# Patient Record
Sex: Female | Born: 1950 | Race: White | Hispanic: No | Marital: Married | Smoking: Former smoker
Health system: Southern US, Community
[De-identification: ages and names within clinical notes are randomized; demographics above are authoritative.]

## PROBLEM LIST (undated history)

## (undated) DIAGNOSIS — F329 Major depressive disorder, single episode, unspecified: Secondary | ICD-10-CM

## (undated) DIAGNOSIS — G2581 Restless legs syndrome: Secondary | ICD-10-CM

## (undated) DIAGNOSIS — G894 Chronic pain syndrome: Secondary | ICD-10-CM

## (undated) DIAGNOSIS — I671 Cerebral aneurysm, nonruptured: Secondary | ICD-10-CM

## (undated) DIAGNOSIS — M1990 Unspecified osteoarthritis, unspecified site: Secondary | ICD-10-CM

## (undated) DIAGNOSIS — N189 Chronic kidney disease, unspecified: Secondary | ICD-10-CM

## (undated) DIAGNOSIS — E785 Hyperlipidemia, unspecified: Secondary | ICD-10-CM

## (undated) DIAGNOSIS — M51369 Other intervertebral disc degeneration, lumbar region without mention of lumbar back pain or lower extremity pain: Secondary | ICD-10-CM

## (undated) DIAGNOSIS — Z87442 Personal history of urinary calculi: Secondary | ICD-10-CM

## (undated) DIAGNOSIS — I10 Essential (primary) hypertension: Secondary | ICD-10-CM

## (undated) DIAGNOSIS — F32A Depression, unspecified: Secondary | ICD-10-CM

## (undated) DIAGNOSIS — G4733 Obstructive sleep apnea (adult) (pediatric): Secondary | ICD-10-CM

## (undated) DIAGNOSIS — E669 Obesity, unspecified: Secondary | ICD-10-CM

## (undated) DIAGNOSIS — R32 Unspecified urinary incontinence: Secondary | ICD-10-CM

## (undated) DIAGNOSIS — H269 Unspecified cataract: Secondary | ICD-10-CM

## (undated) DIAGNOSIS — R7303 Prediabetes: Secondary | ICD-10-CM

## (undated) DIAGNOSIS — G4730 Sleep apnea, unspecified: Secondary | ICD-10-CM

## (undated) DIAGNOSIS — M5136 Other intervertebral disc degeneration, lumbar region: Secondary | ICD-10-CM

## (undated) DIAGNOSIS — I499 Cardiac arrhythmia, unspecified: Secondary | ICD-10-CM

## (undated) DIAGNOSIS — T7840XA Allergy, unspecified, initial encounter: Secondary | ICD-10-CM

## (undated) DIAGNOSIS — M797 Fibromyalgia: Secondary | ICD-10-CM

## (undated) DIAGNOSIS — K219 Gastro-esophageal reflux disease without esophagitis: Secondary | ICD-10-CM

## (undated) DIAGNOSIS — K5100 Ulcerative (chronic) pancolitis without complications: Secondary | ICD-10-CM

## (undated) DIAGNOSIS — T8859XA Other complications of anesthesia, initial encounter: Secondary | ICD-10-CM

## (undated) DIAGNOSIS — E039 Hypothyroidism, unspecified: Secondary | ICD-10-CM

## (undated) DIAGNOSIS — R011 Cardiac murmur, unspecified: Secondary | ICD-10-CM

## (undated) DIAGNOSIS — G709 Myoneural disorder, unspecified: Secondary | ICD-10-CM

## (undated) HISTORY — PX: EYE SURGERY: SHX253

## (undated) HISTORY — DX: Major depressive disorder, single episode, unspecified: F32.9

## (undated) HISTORY — DX: Cardiac murmur, unspecified: R01.1

## (undated) HISTORY — DX: Hyperlipidemia, unspecified: E78.5

## (undated) HISTORY — DX: Allergy, unspecified, initial encounter: T78.40XA

## (undated) HISTORY — DX: Myoneural disorder, unspecified: G70.9

## (undated) HISTORY — DX: Unspecified cataract: H26.9

## (undated) HISTORY — DX: Gastro-esophageal reflux disease without esophagitis: K21.9

## (undated) HISTORY — DX: Depression, unspecified: F32.A

## (undated) HISTORY — DX: Sleep apnea, unspecified: G47.30

## (undated) HISTORY — PX: TUBAL LIGATION: SHX77

## (undated) HISTORY — PX: SPINE SURGERY: SHX786

## (undated) HISTORY — DX: Unspecified osteoarthritis, unspecified site: M19.90

## (undated) HISTORY — PX: COSMETIC SURGERY: SHX468

## (undated) HISTORY — PX: JOINT REPLACEMENT: SHX530

## (undated) HISTORY — DX: Essential (primary) hypertension: I10

---

## 2000-10-05 DIAGNOSIS — I82409 Acute embolism and thrombosis of unspecified deep veins of unspecified lower extremity: Secondary | ICD-10-CM

## 2000-10-05 HISTORY — DX: Acute embolism and thrombosis of unspecified deep veins of unspecified lower extremity: I82.409

## 2000-10-05 HISTORY — PX: BRAIN SURGERY: SHX531

## 2000-10-05 HISTORY — PX: CEREBRAL ANEURYSM REPAIR: SHX164

## 2008-10-23 DIAGNOSIS — E7800 Pure hypercholesterolemia, unspecified: Secondary | ICD-10-CM | POA: Insufficient documentation

## 2008-10-23 DIAGNOSIS — F411 Generalized anxiety disorder: Secondary | ICD-10-CM | POA: Insufficient documentation

## 2008-10-23 DIAGNOSIS — K2100 Gastro-esophageal reflux disease with esophagitis, without bleeding: Secondary | ICD-10-CM | POA: Insufficient documentation

## 2008-10-23 DIAGNOSIS — K219 Gastro-esophageal reflux disease without esophagitis: Secondary | ICD-10-CM | POA: Insufficient documentation

## 2009-02-18 DIAGNOSIS — M797 Fibromyalgia: Secondary | ICD-10-CM | POA: Insufficient documentation

## 2009-10-03 DIAGNOSIS — F32A Depression, unspecified: Secondary | ICD-10-CM | POA: Insufficient documentation

## 2009-10-03 DIAGNOSIS — F339 Major depressive disorder, recurrent, unspecified: Secondary | ICD-10-CM | POA: Insufficient documentation

## 2009-10-03 DIAGNOSIS — G2581 Restless legs syndrome: Secondary | ICD-10-CM | POA: Insufficient documentation

## 2009-12-23 DIAGNOSIS — J301 Allergic rhinitis due to pollen: Secondary | ICD-10-CM | POA: Insufficient documentation

## 2012-05-25 DIAGNOSIS — I872 Venous insufficiency (chronic) (peripheral): Secondary | ICD-10-CM | POA: Insufficient documentation

## 2012-05-25 DIAGNOSIS — G629 Polyneuropathy, unspecified: Secondary | ICD-10-CM | POA: Insufficient documentation

## 2012-05-26 DIAGNOSIS — Z8679 Personal history of other diseases of the circulatory system: Secondary | ICD-10-CM | POA: Insufficient documentation

## 2012-11-22 DIAGNOSIS — M150 Primary generalized (osteo)arthritis: Secondary | ICD-10-CM | POA: Insufficient documentation

## 2013-05-22 DIAGNOSIS — G4700 Insomnia, unspecified: Secondary | ICD-10-CM | POA: Insufficient documentation

## 2013-06-20 DIAGNOSIS — N393 Stress incontinence (female) (male): Secondary | ICD-10-CM | POA: Insufficient documentation

## 2013-06-28 DIAGNOSIS — I10 Essential (primary) hypertension: Secondary | ICD-10-CM | POA: Insufficient documentation

## 2013-08-29 DIAGNOSIS — M130 Polyarthritis, unspecified: Secondary | ICD-10-CM | POA: Insufficient documentation

## 2013-08-29 DIAGNOSIS — E790 Hyperuricemia without signs of inflammatory arthritis and tophaceous disease: Secondary | ICD-10-CM | POA: Insufficient documentation

## 2014-02-09 DIAGNOSIS — E039 Hypothyroidism, unspecified: Secondary | ICD-10-CM | POA: Insufficient documentation

## 2014-02-09 DIAGNOSIS — G479 Sleep disorder, unspecified: Secondary | ICD-10-CM | POA: Insufficient documentation

## 2015-05-31 DIAGNOSIS — G4733 Obstructive sleep apnea (adult) (pediatric): Secondary | ICD-10-CM | POA: Insufficient documentation

## 2015-09-10 DIAGNOSIS — M47816 Spondylosis without myelopathy or radiculopathy, lumbar region: Secondary | ICD-10-CM | POA: Insufficient documentation

## 2015-09-10 DIAGNOSIS — D3610 Benign neoplasm of peripheral nerves and autonomic nervous system, unspecified: Secondary | ICD-10-CM | POA: Insufficient documentation

## 2016-05-05 DIAGNOSIS — M4316 Spondylolisthesis, lumbar region: Secondary | ICD-10-CM | POA: Insufficient documentation

## 2016-10-05 DIAGNOSIS — R32 Unspecified urinary incontinence: Secondary | ICD-10-CM | POA: Insufficient documentation

## 2016-12-02 DIAGNOSIS — E785 Hyperlipidemia, unspecified: Secondary | ICD-10-CM | POA: Insufficient documentation

## 2017-02-17 DIAGNOSIS — M1711 Unilateral primary osteoarthritis, right knee: Secondary | ICD-10-CM | POA: Insufficient documentation

## 2018-10-28 ENCOUNTER — Telehealth: Payer: Self-pay | Admitting: *Deleted

## 2018-11-09 ENCOUNTER — Ambulatory Visit (HOSPITAL_BASED_OUTPATIENT_CLINIC_OR_DEPARTMENT_OTHER): Payer: Medicare Other | Admitting: Student in an Organized Health Care Education/Training Program

## 2018-11-09 ENCOUNTER — Ambulatory Visit
Admission: RE | Admit: 2018-11-09 | Discharge: 2018-11-09 | Disposition: A | Discharge status: Home / Self Care | Payer: Medicare Other | Source: Ambulatory Visit | Attending: Student in an Organized Health Care Education/Training Program | Admitting: Student in an Organized Health Care Education/Training Program

## 2018-11-09 ENCOUNTER — Encounter: Payer: Self-pay | Admitting: Student in an Organized Health Care Education/Training Program

## 2018-11-09 ENCOUNTER — Other Ambulatory Visit: Payer: Self-pay

## 2018-11-09 VITALS — BP 121/73 | HR 87 | Temp 98.2°F | Resp 17 | Ht 64.0 in | Wt 199.0 lb

## 2018-11-09 DIAGNOSIS — Z981 Arthrodesis status: Secondary | ICD-10-CM | POA: Diagnosis present

## 2018-11-09 DIAGNOSIS — G894 Chronic pain syndrome: Secondary | ICD-10-CM | POA: Diagnosis present

## 2018-11-09 DIAGNOSIS — M47812 Spondylosis without myelopathy or radiculopathy, cervical region: Secondary | ICD-10-CM | POA: Insufficient documentation

## 2018-11-09 DIAGNOSIS — M961 Postlaminectomy syndrome, not elsewhere classified: Secondary | ICD-10-CM | POA: Diagnosis present

## 2018-11-09 DIAGNOSIS — M4802 Spinal stenosis, cervical region: Secondary | ICD-10-CM | POA: Diagnosis present

## 2018-11-09 DIAGNOSIS — M48062 Spinal stenosis, lumbar region with neurogenic claudication: Secondary | ICD-10-CM | POA: Diagnosis present

## 2018-11-09 MED ORDER — DEXAMETHASONE SODIUM PHOSPHATE 10 MG/ML IJ SOLN
10.0000 mg | Freq: Once | INTRAMUSCULAR | Status: AC
  Administered 2018-11-09: 10 mg
  Filled 2018-11-09: qty 1

## 2018-11-09 MED ORDER — ROPIVACAINE HCL 2 MG/ML IJ SOLN
10.0000 mL | Freq: Once | INTRAMUSCULAR | Status: AC
  Administered 2018-11-09: 10 mL
  Filled 2018-11-09: qty 10

## 2018-11-09 MED ORDER — LACTATED RINGERS IV SOLN
1000.0000 mL | Freq: Once | INTRAVENOUS | Status: AC
  Administered 2018-11-09: 1000 mL via INTRAVENOUS

## 2018-11-09 MED ORDER — LIDOCAINE HCL 2 % IJ SOLN
20.0000 mL | Freq: Once | INTRAMUSCULAR | Status: AC
  Administered 2018-11-09: 400 mg
  Filled 2018-11-09: qty 20

## 2018-11-09 MED ORDER — FENTANYL CITRATE (PF) 100 MCG/2ML IJ SOLN
25.0000 ug | INTRAMUSCULAR | Status: AC | PRN
  Administered 2018-11-09: 100 ug via INTRAVENOUS
  Filled 2018-11-09: qty 2

## 2018-11-09 NOTE — Patient Instructions (Signed)

## 2018-11-09 NOTE — Progress Notes (Signed)
Patient's Name: Lisa Good  MRN: [id number]  Referring Provider: Meade Maw, MD  DOB: [birth year]  PCP: [name], MD  DOS: 11/09/2018  Note by: Gillis Santa, MD  Service setting: Ambulatory outpatient  Specialty: Interventional Pain Management  Patient type: Established  Location: [facility] (AMB) Pain Management Facility  Visit type: Interventional Procedure   Primary Reason for Visit: Interventional Pain Management Treatment. CC: Neck Pain  Procedure:          Anesthesia, Analgesia, Anxiolysis:  Type: Cervical Facet Medial Branch Block(s)  #1  Primary Purpose: Diagnostic Region: Posterolateral cervical spine Level:  C4, C5, C6, & C7 Medial Branch Level(s). Injecting these levels blocks the C4-5, C5-6, and C6-7 cervical facet joints. Laterality: Bilateral  Type: Moderate (Conscious) Sedation combined with Local Anesthesia Indication(s): Analgesia and Anxiety Route: Intravenous (IV) IV Access: Secured Sedation: Meaningful verbal contact was maintained at all times during the procedure  Local Anesthetic: Lidocaine 1-2%  Position: Prone with head of the table raised to facilitate breathing.   Indications: 1. Cervical spondylosis without myelopathy    Pain Score: Pre-procedure: 5 /10 Post-procedure: 0-No pain/10  Pre-op Assessment:  Lisa Good is a 68 y.o. (year old), female patient, seen today for interventional treatment. She  has a past surgical history that includes Spine surgery; Joint replacement (Right); Tubal ligation; and Brain surgery (2002). Lisa Good has a current medication list which includes the following prescription(s): alpha-lipoic acid, amlodipine, atorvastatin, buprenorphine hcl, celecoxib, fluticasone, gabapentin, and lisinopril, and the following Facility-Administered Medications: fentanyl and lactated ringers. Her primarily concern today is the Neck Pain  Initial Vital Signs:  Pulse/HCG Rate: 87ECG Heart Rate: 73 Temp: 98.5 F (36.9 C) Resp:  16 BP: 108/75 SpO2: 98 %  BMI: Estimated body mass index is 34.16 kg/m as calculated from the following:   Height as of this encounter: 5' 4"  (1.626 m).   Weight as of this encounter: 199 lb (90.3 kg).  Risk Assessment: Allergies: Reviewed. She is allergic to contrast media [iodinated diagnostic agents].  Allergy Precautions: None required Coagulopathies: Reviewed. None identified.  Blood-thinner therapy: None at this time Active Infection(s): Reviewed. None identified. Lisa Good is afebrile  Site Confirmation: Lisa Good was asked to confirm the procedure and laterality before marking the site Procedure checklist: Completed Consent: Before the procedure and under the influence of no sedative(s), amnesic(s), or anxiolytics, the patient was informed of the treatment options, risks and possible complications. To fulfill our ethical and legal obligations, as recommended by the American Medical Association's Code of Ethics, I have informed the patient of my clinical impression; the nature and purpose of the treatment or procedure; the risks, benefits, and possible complications of the intervention; the alternatives, including doing nothing; the risk(s) and benefit(s) of the alternative treatment(s) or procedure(s); and the risk(s) and benefit(s) of doing nothing. The patient was provided information about the general risks and possible complications associated with the procedure. These may include, but are not limited to: failure to achieve desired goals, infection, bleeding, organ or nerve damage, allergic reactions, paralysis, and death. In addition, the patient was informed of those risks and complications associated to Spine-related procedures, such as failure to decrease pain; infection (i.e.: Meningitis, epidural or intraspinal abscess); bleeding (i.e.: epidural hematoma, subarachnoid hemorrhage, or any other type of intraspinal or peri-dural bleeding); organ or nerve damage (i.e.: Any type of  peripheral nerve, nerve root, or spinal cord injury) with subsequent damage to sensory, motor, and/or autonomic systems, resulting in permanent pain, numbness, and/or weakness of  one or several areas of the body; allergic reactions; (i.e.: anaphylactic reaction); and/or death. Furthermore, the patient was informed of those risks and complications associated with the medications. These include, but are not limited to: allergic reactions (i.e.: anaphylactic or anaphylactoid reaction(s)); adrenal axis suppression; blood sugar elevation that in diabetics may result in ketoacidosis or comma; water retention that in patients with history of congestive heart failure may result in shortness of breath, pulmonary edema, and decompensation with resultant heart failure; weight gain; swelling or edema; medication-induced neural toxicity; particulate matter embolism and blood vessel occlusion with resultant organ, and/or nervous system infarction; and/or aseptic necrosis of one or more joints. Finally, the patient was informed that Medicine is not an exact science; therefore, there is also the possibility of unforeseen or unpredictable risks and/or possible complications that may result in a catastrophic outcome. The patient indicated having understood very clearly. We have given the patient no guarantees and we have made no promises. Enough time was given to the patient to ask questions, all of which were answered to the patient's satisfaction. Lisa Good has indicated that she wanted to continue with the procedure. Attestation: I, the ordering provider, attest that I have discussed with the patient the benefits, risks, side-effects, alternatives, likelihood of achieving goals, and potential problems during recovery for the procedure that I have provided informed consent. Date  Time: 11/09/2018 12:39 PM  Pre-Procedure Preparation:  Monitoring: As per clinic protocol. Respiration, ETCO2, SpO2, BP, heart rate and rhythm  monitor placed and checked for adequate function Safety Precautions: Patient was assessed for positional comfort and pressure points before starting the procedure. Time-out: I initiated and conducted the "Time-out" before starting the procedure, as per protocol. The patient was asked to participate by confirming the accuracy of the "Time Out" information. Verification of the correct person, site, and procedure were performed and confirmed by me, the nursing staff, and the patient. "Time-out" conducted as per Joint Commission's Universal Protocol (UP.01.01.01). Time: 1352  Description of Procedure:          Laterality: Bilateral. The procedure was performed in identical fashion on both sides. Level: C4, C5, C6, & C7 Medial Branch Level(s). Area Prepped: Posterior Cervico-thoracic Region Prepping solution: ChloraPrep (2% chlorhexidine gluconate and 70% isopropyl alcohol) Safety Precautions: Aspiration looking for blood return was conducted prior to all injections. At no point did we inject any substances, as a needle was being advanced. Before injecting, the patient was told to immediately notify me if she was experiencing any new onset of "ringing in the ears, or metallic taste in the mouth". No attempts were made at seeking any paresthesias. Safe injection practices and needle disposal techniques used. Medications properly checked for expiration dates. SDV (single dose vial) medications used. After the completion of the procedure, all disposable equipment used was discarded in the proper designated medical waste containers. Local Anesthesia: Protocol guidelines were followed. The patient was positioned over the fluoroscopy table. The area was prepped in the usual manner. The time-out was completed. The target area was identified using fluoroscopy. A 12-in long, straight, sterile hemostat was used with fluoroscopic guidance to locate the targets for each level blocked. Once located, the skin was marked  with an approved surgical skin marker. Once all sites were marked, the skin (epidermis, dermis, and hypodermis), as well as deeper tissues (fat, connective tissue and muscle) were infiltrated with a small amount of a short-acting local anesthetic, loaded on a 10cc syringe with a 25G, 1.5-in  Needle. An appropriate amount  of time was allowed for local anesthetics to take effect before proceeding to the next step. Local Anesthetic: Lidocaine 2.0% The unused portion of the local anesthetic was discarded in the proper designated containers. Technical explanation of process:   C4 Medial Branch Nerve Block (MBB): The target area for the C4 dorsal medial articular branch is the lateral concave waist of the articular pillar of C4. Under fluoroscopic guidance, a Quincke needle was inserted until contact was made with os over the postero-lateral aspect of the articular pillar of C4 (target area). After negative aspiration for blood, 52m of the nerve block solution was injected without difficulty or complication. The needle was removed intact. C5 Medial Branch Nerve Block (MBB): The target area for the C5 dorsal medial articular branch is the lateral concave waist of the articular pillar of C5. Under fluoroscopic guidance, a Quincke needle was inserted until contact was made with os over the postero-lateral aspect of the articular pillar of C5 (target area). After negative aspiration for blood, 165mof the nerve block solution was injected without difficulty or complication. The needle was removed intact. C6 Medial Branch Nerve Block (MBB): The target area for the C6 dorsal medial articular branch is the lateral concave waist of the articular pillar of C6. Under fluoroscopic guidance, a Quincke needle was inserted until contact was made with os over the postero-lateral aspect of the articular pillar of C6 (target area). After negative aspiration for blood, 42m27mf the nerve block solution was injected without difficulty or  complication. The needle was removed intact. C7 Medial Branch Nerve Block (MBB): The target for the C7 dorsal medial articular branch lies on the superior-medial tip of the C7 transverse process. Under fluoroscopic guidance, a Quincke needle was inserted until contact was made with os over the postero-lateral aspect of the articular pillar of C7 (target area). After negative aspiration for blood, 42mL34m the nerve block solution was injected without difficulty or complication. The needle was removed intact. Procedural Needles: 25-gauge, 3.5-inch, Quincke needles used for all levels. Nerve block solution: 10 cc solution made of 8 cc of 0.2% ropivacaine, 2 cc of Decadron 10 mg/cc.  1.5 cc injected at each level above bilaterally.  The unused portion of the solution was discarded in the proper designated containers.  Once the entire procedure was completed, the treated area was cleaned, making sure to leave some of the prepping solution back to take advantage of its long term bactericidal properties.  Vitals:   11/09/18 1415 11/09/18 1420 11/09/18 1422 11/09/18 1432  BP: 133/78 132/81 134/78 (!) 126/56  Pulse:      Resp: 11 10 15 14   Temp:    98.2 F (36.8 C)  TempSrc:      SpO2: 94% 93% 96% 100%  Weight:      Height:        Start Time: 1352 hrs. End Time: 1421 hrs.  Imaging Guidance (Spinal):          Type of Imaging Technique: Fluoroscopy Guidance (Spinal) Indication(s): Assistance in needle guidance and placement for procedures requiring needle placement in or near specific anatomical locations not easily accessible without such assistance. Exposure Time: Please see nurses notes. Contrast: None used. Fluoroscopic Guidance: I was personally present during the use of fluoroscopy. "Tunnel Vision Technique" used to obtain the best possible view of the target area. Parallax error corrected before commencing the procedure. "Direction-depth-direction" technique used to introduce the needle under  continuous pulsed fluoroscopy. Once target was reached, antero-posterior, oblique,  and lateral fluoroscopic projection used confirm needle placement in all planes. Images permanently stored in EMR. Interpretation: No contrast injected. I personally interpreted the imaging intraoperatively. Adequate needle placement confirmed in multiple planes. Permanent images saved into the patient's record.  Antibiotic Prophylaxis:   Anti-infectives (From admission, onward)   None     Indication(s): None identified  Post-operative Assessment:  Post-procedure Vital Signs:  Pulse/HCG Rate: 8783 Temp: 98.2 F (36.8 C) Resp: 14 BP: (!) 126/56 SpO2: 100 %  EBL: None  Complications: No immediate post-treatment complications observed by team, or reported by patient.  Note: The patient tolerated the entire procedure well. A repeat set of vitals were taken after the procedure and the patient was kept under observation following institutional policy, for this type of procedure. Post-procedural neurological assessment was performed, showing return to baseline, prior to discharge. The patient was provided with post-procedure discharge instructions, including a section on how to identify potential problems. Should any problems arise concerning this procedure, the patient was given instructions to immediately contact us, at any time, without hesitation. In any case, we plan to contact the patient by telephone for a follow-up status report regarding this interventional procedure.  Comments:  No additional relevant information.  Plan of Care   Imaging Orders     DG C-Arm 1-60 Min-No Report  Procedure Orders     CERVICAL FACET (MEDIAL BRANCH NERVE BLOCK)   Medications ordered for procedure: Meds ordered this encounter  Medications  . lactated ringers infusion 1,000 mL  . fentaNYL (SUBLIMAZE) injection 25-100 mcg    Make sure Narcan is available in the pyxis when using this medication. In the event of  respiratory depression (RR< 8/min): Titrate NARCAN (naloxone) in increments of 0.1 to 0.2 mg IV at 2-3 minute intervals, until desired degree of reversal.  . ropivacaine (PF) 2 mg/mL (0.2%) (NAROPIN) injection 10 mL  . lidocaine (XYLOCAINE) 2 % (with pres) injection 400 mg  . dexamethasone (DECADRON) injection 10 mg  . dexamethasone (DECADRON) injection 10 mg   Medications administered: We administered lactated ringers, fentaNYL, ropivacaine (PF) 2 mg/mL (0.2%), lidocaine, dexamethasone, and dexamethasone.  See the medical record for exact dosing, route, and time of administration.  Disposition: Discharge home  Discharge Date & Time: 11/09/2018;   hrs.   Physician-requested Follow-up: Return in about 4 weeks (around 12/07/2018) for Post Procedure Evaluation.  Future Appointments  Date Time Provider [place]  12/13/2018 10:15 AM Gillis Santa, MD [facility] None   Primary Care Physician: [name], MD Location: [facility] Note by: Gillis Santa, MD Date: 11/09/2018; Time: 2:37 PM  Disclaimer:  Medicine is not an exact science. The only guarantee in medicine is that nothing is guaranteed. It is important to note that the decision to proceed with this intervention was based on the information collected from the patient. The Data and conclusions were drawn from the patient's questionnaire, the interview, and the physical examination. Because the information was provided in large part by the patient, it cannot be guaranteed that it has not been purposely or unconsciously manipulated. Every effort has been made to obtain as much relevant data as possible for this evaluation. It is important to note that the conclusions that lead to this procedure are derived in large part from the available data. Always take into account that the treatment will also be dependent on availability of resources and existing treatment guidelines, considered by other Pain  Management Practitioners as being common knowledge and practice, at  the time of the intervention. For Medico-Legal purposes, it is also important to point out that variation in procedural techniques and pharmacological choices are the acceptable norm. The indications, contraindications, technique, and results of the above procedure should only be interpreted and judged by a Board-Certified Interventional Pain Specialist with extensive familiarity and expertise in the same exact procedure and technique.

## 2018-11-09 NOTE — Progress Notes (Signed)
Patient's Name: Lisa Good  MRN: [id number]  Referring Provider: Meade Maw, MD  DOB: 26-Apr-1951  PCP: [name], MD  DOS: 11/09/2018  Note by: Gillis Santa, MD  Service setting: Ambulatory outpatient  Specialty: Interventional Pain Management  Location: [facility] (AMB) Pain Management Facility    Patient type: New patient ("FAST-TRACK" Evaluation)   Warning: This referral option does not include the extensive pharmacological evaluation required for [place] to take over the patient's medication management. The "Fast-Track" system is designed to bypass the new patient referral waiting list, as well as the normal patient evaluation process, in order to provide a patient in distress with a timely pain management intervention. Because the system was not designed to unfairly get a patient into our pain practice ahead of those already waiting, certain restrictions apply. By requesting a "Fast-Track" consult, the referring physician has opted to continue managing the patient's medications in order to get interventional urgent care.  Primary Reason for Visit: Interventional Pain Management Treatment. CC: Neck Pain   Procedure  HPI  Ms. Valbuena is a 68 y.o. year old, female patient, who comes today for a  "Fast-Track" new patient evaluation, as requested by Meade Maw, MD. The patient has been made aware that this type of referral option is reserved for the Interventional Pain Management portion of our practice and completely excludes the option of medication management. Her primarily concern today is the Neck Pain  Pain Assessment: Location:   Neck Radiating: denies Onset: More than a month ago Duration: Chronic pain Quality: Constant, Aching, Sharp Severity: 5 /10 (subjective, self-reported pain score)  Note: Reported level is compatible with observation.                         When using our objective Pain Scale, levels between 6 and 10/10 are said to belong in an emergency room, as  it progressively worsens from a 6/10, described as severely limiting, requiring emergency care not usually available at an outpatient pain management facility. At a 6/10 level, communication becomes difficult and requires great effort. Assistance to reach the emergency department may be required. Facial flushing and profuse sweating along with potentially dangerous increases in heart rate and blood pressure will be evident. Effect on ADL: "it makes me miserable, keeps me from being able to do things" Timing: Constant Modifying factors: heat BP: 121/73  HR: 87  Onset and Duration: Present longer than 3 months Cause of pain: not noted Severity: Getting worse, NAS-11 at its worse: 10/10, NAS-11 at its best: 3/10, NAS-11 now: 5/10 and NAS-11 on the average: 5/10 Timing: Not influenced by the time of the day, During activity or exercise and After activity or exercise Aggravating Factors: Lifiting, Prolonged standing, Twisting and Walking Alleviating Factors: Cold packs and Sleeping Associated Problems: Depression, Fatigue, Inability to control bladder (urine), Numbness, Sweating, Temperature changes and Pain that wakes patient up Quality of Pain: Aching, Horrible and Tingling Previous Examinations or Tests: CT scan, MRI scan, Nerve block, Nerve conduction test and Orthopedic evaluation Previous Treatments: Physical Therapy, Pool exercises, Stretching exercises and TENS  The patient comes into the clinics today, referred to [place] for a bilateral cervical facet medial branch nerve block for chronic persistent neck pain secondary to cervical facet pathology and cervical spondylosis.  Patient also has a history of L2-S1 fusion done in August 2017.  She is currently on belbuca for 150 mcg twice daily.  She finds this more effective than oxycodone.  Patient endorses  numbness and tingling in bilateral lower extremities.  Patient does have weakness with walking.  This is been going on for the last 10 years and  has not been improved with surgery in 2017.  Regard to the patient's chronic neck pain which is her chief complaint today.  There is present in the lower cervical and upper thoracic region.  Patient does not endorse any radicular symptoms.  She does endorse numbness and posterior dominant headaches.  Meds   Current Outpatient Medications:  .  Alpha-Lipoic Acid 100 MG TABS, Take 300 mg by mouth 2 (two) times daily., Disp: , Rfl:  .  amLODipine (NORVASC) 10 MG tablet, Take 10 mg by mouth daily., Disp: , Rfl:  .  atorvastatin (LIPITOR) 40 MG tablet, Take 40 mg by mouth daily., Disp: , Rfl:  .  Buprenorphine HCl (BELBUCA) 450 MCG FILM, Place inside cheek 2 (two) times daily. One film inside cheek, Disp: , Rfl:  .  celecoxib (CELEBREX) 200 MG capsule, Take 200 mg by mouth 2 (two) times daily., Disp: , Rfl:  .  fluticasone (FLONASE) 50 MCG/ACT nasal spray, Place 2 sprays into both nostrils daily., Disp: , Rfl:  .  gabapentin (NEURONTIN) 800 MG tablet, Take 800 mg by mouth 4 (four) times daily., Disp: , Rfl:  .  lisinopril (PRINIVIL,ZESTRIL) 40 MG tablet, Take 40 mg by mouth daily., Disp: , Rfl:   Current Facility-Administered Medications:  .  fentaNYL (SUBLIMAZE) injection 25-100 mcg, 25-100 mcg, Intravenous, Q5 min PRN, Holley Raring, Nissa Stannard, MD, 100 mcg at 11/09/18 1411  Imaging Review  Imaging: CL spine CT 08/04/2018 IMPRESSION: 1. Cervical kyphosis. 2. Advanced multilevel DDD and facet arthrosis. Moderate spinal canal stenosis C3-4, C5-6 and C6-7. Prominent foraminal stenosis bilaterally C3-4, C5-6, C6-7 and C7-T1. 3. No acute fracture.  Electronically Signed by: Eddie Dibbles IMPRESSION: 1. Patient again status post interbody fusion and posterior fixation L2-3 through L5-S1. There is incorporation of the interbody grafts at L3-4 and L4-5. However, it is difficult to confirm incorporation of the interbody grafts at L1-2 and L5-S1. Tip of the right L2 pedicle screw protrudes slightly into the  L1-2 disc space.  2. No acute fracture. 3.  Advanced adjacent level disease at L1-2 with prominent discogenic subchondral sclerosis and cystic endplate changes (Schmorl's nodes). There is a generalized disc bulge as well as a small inferiorly migrating right paracentral disc extrusion  containing some gas that along with some prominence of the ventral subdural that result in at least moderate spinal canal stenosis.  4.  Spinal canal otherwise widely patent.  5.  Moderate bilateral foraminal stenosis L5-S1.   MRI [facility] spine 07/11/2018  1. Severe C3-4 spinal stenosis with associated cord compression but no definite cord signal abnormality. 2. Moderate to severe C5-6 and C6-7 spinal stenosis with associated cord mass effect but no definite cord signal abnormality. 3. Severe right C3-4, severe right C5-6, and severe right C6-7 neural foraminal stenoses. 4. Additional multilevel cervical degenerative disease, as further described above. Marland Kitchen Postoperative findings related to interval L2-S1 posterior spinal instrumentation, as further described above and including a strictly sterility-indeterminate fluid collection associated with the laminectomy bed spanning L3-L5 and associated with the posterior instrumentation hardware. 2. Severe L1-2 spinal stenosis with associated mass effect upon the conus medullaris and cauda equina nerve roots. 3. Substantial interval progression of L1-2 adjacent level disease, as further described above. The small amount of fluid within the L1-2 intervertebral disc space and the new L1 inferior endplate deformity are favored to relate  to degenerative disease and Schmorl's node formation, respectively. However, discitis-osteomyelitis would represent an imaging differential consideration, such that correlation with CBC, blood cultures, ESR, and CRP as well as with the presence or absence of clinical signs of infection would be recommended. 4. The new heterogeneous 22 mm  abnormality associated with the right L2-3 neural foramen and likely exerting mass effect upon the exiting right L2 nerve roots is considered most likely to represent extruded disc material, however, postcontrast imaging is recommended for further evaluation because abscess and neoplasm are differential considerations. 5. Cholelithiasis.   Complexity Note: Imaging results reviewed. Results shared with Ms. Sedberry, using Layman's terms.                         ROS  Cardiovascular: Needs antibiotics prior to dental procedures Pulmonary or Respiratory: Shortness of breath, Snoring  and Temporary stoppage of breathing during sleep Neurological: Abnormal skin sensations (Peripheral Neuropathy), Curved spine and Incontinence:  Urinary Review of Past Neurological Studies: No results found for this or any previous visit. Psychological-Psychiatric: Anxiousness, Depressed and Difficulty sleeping and or falling asleep Gastrointestinal: Reflux or heatburn Genitourinary: No reported renal or genitourinary signs or symptoms such as difficulty voiding or producing urine, peeing blood, non-functioning kidney, kidney stones, difficulty emptying the bladder, difficulty controlling the flow of urine, or chronic kidney disease Hematological: Brusing easily Endocrine: No reported endocrine signs or symptoms such as high or low blood sugar, rapid heart rate due to high thyroid levels, obesity or weight gain due to slow thyroid or thyroid disease Rheumatologic: Generalized muscle aches (Fibromyalgia) and Constant unexplained fatigue (Chronic Fatigue Syndrome) Musculoskeletal: Negative for myasthenia gravis, muscular dystrophy, multiple sclerosis or malignant hyperthermia Work History: Retired  Allergies  Ms. Rosch is allergic to contrast media [iodinated diagnostic agents].    [place]  Drug: Ms. Claytor  reports no history of drug use. Alcohol:  reports no history of alcohol use. Tobacco:  reports that she has  never smoked. She has never used smokeless tobacco. Medical:  has a past medical history of Allergy, Arthritis, Depression, GERD (gastroesophageal reflux disease), Hyperlipidemia, Hypertension, Neuromuscular disorder ([place]), and Sleep apnea. Family: family history is not on file.  Past Surgical History:  Procedure Laterality Date  . 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 JOINT REPLACEMENT Right    knee  . SPINE SURGERY     spinal fusion  . TUBAL LIGATION     Active Ambulatory Problems    Diagnosis Date Noted  . Cervical spondylosis without myelopathy 11/09/2018  . Foraminal stenosis of cervical region 11/09/2018  . Cervical stenosis of spinal canal 11/09/2018  . History of fusion of lumbar spine 11/09/2018  . 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 Postlaminectomy syndrome of lumbosacral region 11/09/2018  . Chronic pain syndrome 11/09/2018   Resolved Ambulatory Problems    Diagnosis Date Noted  . No Resolved Ambulatory Problems   Past Medical History:  Diagnosis Date  . Allergy   . Arthritis   . Depression   . GERD (gastroesophageal reflux disease)   . Hyperlipidemia   . Hypertension   . Neuromuscular disorder ([place])   . Sleep apnea    Constitutional Exam  General appearance: Well nourished, well developed, and well hydrated. In no apparent acute distress Vitals:   11/09/18 1422 11/09/18 1432 11/09/18 1442 11/09/18 1451  BP: 134/78 (!) 126/56 117/76 121/73  Pulse:      Resp: 15 14 12  17  Temp:  98.2 F (36.8 C)  98.2 F (36.8 C)  TempSrc:      SpO2: 96% 100% 100% 100%  Weight:      Height:       BMI Assessment: Estimated body mass index is 34.16 kg/m as calculated from the following:   Height as of this encounter: 5' 4"  (1.626 m).   Weight as of this encounter: 199 lb (90.3 kg).  BMI interpretation table: BMI level Category Range association with higher incidence of chronic pain  <18 kg/m2 Underweight   18.5-24.9 kg/m2 Ideal  body weight   25-29.9 kg/m2 Overweight Increased incidence by 20%  30-34.9 kg/m2 Obese (Class I) Increased incidence by 68%  35-39.9 kg/m2 Severe obesity (Class II) Increased incidence by 136%  >40 kg/m2 Extreme obesity (Class III) Increased incidence by 254%   Patient's current BMI Ideal Body weight  Body mass index is 34.16 kg/m. Ideal body weight: 54.7 kg (120 lb 9.5 oz) Adjusted ideal body weight: 68.9 kg (151 lb 15.3 oz)   BMI Readings from Last 4 Encounters:  11/09/18 34.16 kg/m   Wt Readings from Last 4 Encounters:  11/09/18 199 lb (90.3 kg)  Psych/Mental status: Alert, oriented x 3 (person, place, & time)       Eyes: PERLA Respiratory: No evidence of acute respiratory distress  Cervical Spine Area Exam  Skin & Axial Inspection: No masses, redness, edema, swelling, or associated skin lesions Alignment: Symmetrical Functional ROM: Pain restricted ROM      Stability: No instability detected Muscle Tone/Strength: Functionally intact. No obvious neuro-muscular anomalies detected. Sensory (Neurological): Arthropathic arthralgia Palpation: Complains of area being tender to palpation              Upper Extremity (UE) Exam    Side: Right upper extremity  Side: Left upper extremity  Skin & Extremity Inspection: Skin color, temperature, and hair growth are WNL. No peripheral edema or cyanosis. No masses, redness, swelling, asymmetry, or associated skin lesions. No contractures.  Skin & Extremity Inspection: Skin color, temperature, and hair growth are WNL. No peripheral edema or cyanosis. No masses, redness, swelling, asymmetry, or associated skin lesions. No contractures.  Functional ROM: Unrestricted ROM          Functional ROM: Unrestricted ROM          Muscle Tone/Strength: Functionally intact. No obvious neuro-muscular anomalies detected.  Muscle Tone/Strength: Functionally intact. No obvious neuro-muscular anomalies detected.  Sensory (Neurological): Unimpaired          Sensory  (Neurological): Unimpaired          Palpation: No palpable anomalies              Palpation: No palpable anomalies              Provocative Test(s):  Phalen's test: deferred Tinel's test: deferred Apley's scratch test (touch opposite shoulder):  Action 1 (Across chest): deferred Action 2 (Overhead): deferred Action 3 (LB reach): deferred   Provocative Test(s):  Phalen's test: deferred Tinel's test: deferred Apley's scratch test (touch opposite shoulder):  Action 1 (Across chest): deferred Action 2 (Overhead): deferred Action 3 (LB reach): deferred     Lumbar Spine Area Exam  Skin & Axial Inspection: Well healed scar from previous spine surgery detected Alignment: Scoliosis detected Functional ROM: Pain restricted ROM affecting both sides Stability: No instability detected Muscle Tone/Strength: Functionally intact. No obvious neuro-muscular anomalies detected. Sensory (Neurological): Dermatomal pain pattern Palpation: No palpable anomalies       Provocative Tests:  Hyperextension/rotation test: (+) due to pain. Lumbar quadrant test (Kemp's test): (+) bilateral for foraminal stenosis Lateral bending test: (+) due to fusion restriction. Patrick's Maneuver: (+) for bilateral S-I arthralgia             FABER* test: deferred today                   S-I anterior distraction/compression test: deferred today         S-I lateral compression test: deferred today         S-I Thigh-thrust test: deferred today         S-I Gaenslen's test: deferred today         *(Flexion, ABduction and External Rotation)  Gait & Posture Assessment  Ambulation: Unassisted Gait: Relatively normal for age and body habitus Posture: WNL   Lower Extremity Exam    Side: Right lower extremity  Side: Left lower extremity  Stability: No instability observed          Stability: No instability observed          Skin & Extremity Inspection: Skin color, temperature, and hair growth are WNL. No peripheral edema or  cyanosis. No masses, redness, swelling, asymmetry, or associated skin lesions. No contractures.  Skin & Extremity Inspection: Skin color, temperature, and hair growth are WNL. No peripheral edema or cyanosis. No masses, redness, swelling, asymmetry, or associated skin lesions. No contractures.  Functional ROM: Decreased ROM for hip joint          Functional ROM: Decreased ROM for hip joint          Muscle Tone/Strength: Functionally intact. No obvious neuro-muscular anomalies detected.  Muscle Tone/Strength: Functionally intact. No obvious neuro-muscular anomalies detected.  Sensory (Neurological): Dermatomal pain pattern        Sensory (Neurological): Dermatomal pain pattern        DTR: Patellar: 1+: trace Achilles: deferred today Plantar: deferred today  DTR: Patellar: 1+: trace Achilles: deferred today Plantar: deferred today  Palpation: No palpable anomalies  Palpation: No palpable anomalies    Procedure   Fast-track for cervical facet medial branch nerve block for cervical facet pathology.  Risks and benefits discussed with procedure.  Patient does have pain with cervical facet loading and also has posterior dominant headaches that can be consistent with cervical facet pathology.  We will plan for bilateral diagnostic C4, C5, C6, C7 cervical facet medial branch nerve blocks.  Patient also has a history of L2-S1 lumbar spine fusion.  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  She was also referred here to discuss thoracolumbar spinal cord stimulation.  I expressed my concerns about her T12-L1 and L1-L2 disc space.  The best location for a thoracolumbar spinal cord stimulator trial is generally T12-L1 or L1-L2 and given patient's imaging studies, it seems that her anatomy is altered at this area.  Furthermore she likely has granulation tissue superior to her surgical scar that may impede or  obstruct satisfactory entry into her interlaminar space at T12-L1 or L1-L2.

## 2018-11-09 NOTE — Progress Notes (Signed)
Safety precautions to be maintained throughout the outpatient stay will include: orient to surroundings, keep bed in low position, maintain call bell within reach at all times, provide assistance with transfer out of bed and ambulation.  

## 2018-12-13 ENCOUNTER — Ambulatory Visit: Payer: Medicare Other | Admitting: Student in an Organized Health Care Education/Training Program

## 2018-12-22 ENCOUNTER — Ambulatory Visit: Payer: Medicare Other | Admitting: Student in an Organized Health Care Education/Training Program

## 2019-01-03 ENCOUNTER — Telehealth: Payer: Self-pay | Admitting: *Deleted

## 2019-01-03 ENCOUNTER — Ambulatory Visit
Payer: Medicare Other | Attending: Student in an Organized Health Care Education/Training Program | Admitting: Student in an Organized Health Care Education/Training Program

## 2019-01-03 ENCOUNTER — Other Ambulatory Visit: Payer: Self-pay

## 2019-01-03 DIAGNOSIS — M47812 Spondylosis without myelopathy or radiculopathy, cervical region: Secondary | ICD-10-CM | POA: Diagnosis not present

## 2019-01-03 DIAGNOSIS — M48062 Spinal stenosis, lumbar region with neurogenic claudication: Secondary | ICD-10-CM

## 2019-01-03 DIAGNOSIS — Z981 Arthrodesis status: Secondary | ICD-10-CM

## 2019-01-03 DIAGNOSIS — G894 Chronic pain syndrome: Secondary | ICD-10-CM

## 2019-01-03 DIAGNOSIS — M4802 Spinal stenosis, cervical region: Secondary | ICD-10-CM

## 2019-01-03 NOTE — Progress Notes (Signed)
Pain Management Encounter Note - Virtual Visit via Telephone Telehealth (real-time audio visits between healthcare provider and patient).  Patient's Phone No. & Preferred Pharmacy:  [phone] (home); There is no such number on file (mobile).; (Preferred) [phone]  [name], [address] [place] [address] Phone: [phone] Fax: [phone]   Pre-screening note:  Our staff contacted Lisa Good and offered her an "in person", "face-to-face" appointment versus a telephone encounter. She indicated preferring the telephone encounter, at this time.  Reason for Virtual Visit: COVID-19*  Social distancing based on CDC and AMA recommendations.   I contacted Lisa Good on 01/03/2019 at 12:37 PM by telephone and clearly identified myself as Lisa Santa, MD. I verified that I was speaking with the correct person using two identifiers (Name and date of birth: October 13, 1950).  Advanced Informed Consent I sought verbal advanced consent from [facility] for telemedicine interactions and virtual visit. I informed Lisa Good of the security and privacy concerns, risks, and limitations associated with performing an evaluation and management service by telephone. I also informed Lisa Good of the availability of "in person" appointments and I informed her of the possibility of a patient responsible charge related to this service. Lisa Good expressed understanding and agreed to proceed.   Historic Elements   Lisa Good is a 68 y.o. year old, female patient evaluated today after her last encounter by our practice on 12/13/2018. Lisa Good  has a past medical history of Allergy, Arthritis, Depression, GERD (gastroesophageal reflux disease), Hyperlipidemia, Hypertension, Neuromuscular disorder ([place]), and Sleep apnea. She also  has a past surgical history that includes Spine surgery; Joint replacement  (Right); Tubal ligation; and Brain surgery (2002). Lisa Good has a current medication list which includes the following prescription(s): alpha-lipoic acid, amlodipine, atorvastatin, buprenorphine hcl, celecoxib, fluticasone, gabapentin, and lisinopril. She  reports that she has never smoked. She has never used smokeless tobacco. She reports that she does not drink alcohol or use drugs. Lisa Good is allergic to contrast media [iodinated diagnostic agents].   HPI  I last saw her on 12/13/2018. She is being evaluated for a post-procedure assessment.  Post-Procedure Evaluation  Procedure: Bilateral C4, C5, C6, C7 cervical facet medial branch nerve block Pre-procedure pain level:  5/10 Post-procedure: 0/10          Sedation: Please see nurses note.  Effectiveness during initial hour after procedure(Ultra-Short Term Relief): 100 %  Local anesthetic used: Long-acting (4-6 hours) Effectiveness: Defined as any analgesic benefit obtained secondary to the administration of local anesthetics. This carries significant diagnostic value as to the etiological location, or anatomical origin, of the pain. Duration of benefit is expected to coincide with the duration of the local anesthetic used.  Effectiveness during initial 4-6 hours after procedure(Short-Term Relief): 40 %  Long-term benefit: Defined as any relief past the pharmacologic duration of the local anesthetics.  Effectiveness past the initial 6 hours after procedure(Long-Term Relief): 30 %  Current benefits: Defined as benefit that persist at this time.   Analgesia:  <50% better Function: Somewhat improved ROM: No improvement   Review of recent tests  DG C-Arm 1-60 Min-No Report Fluoroscopy was utilized by the requesting physician.  No radiographic  interpretation.    No results found for any previous visit.   Assessment  The primary encounter diagnosis was Cervical spondylosis without myelopathy. Diagnoses of Foraminal stenosis of  cervical region, Cervical  stenosis of spinal canal, History of fusion of lumbar spine, Spinal stenosis of lumbar region with neurogenic claudication, and Chronic pain syndrome were also pertinent to this visit.  Plan of Care  I discussed the assessment and treatment plan with the patient. The patient was provided an opportunity to ask questions and all were answered. The patient agreed with the plan and demonstrated an understanding of the instructions.  Tele-visit status post bilateral cervical facet medial branch nerve blocks at C4, C5, C6, C7.  Patient endorses approximately 50% pain relief for 5 to 7 days after the block.  She also had improved range of motion at this time.  She states that her pain levels are back to pre-block levels.  We discussed repeating cervical facet medial branch nerve block #2 and adding more volume to see if that provided her with longer duration of pain relief.  Obtain UDS at next visit.  Consider taking over patient's belbuca.  She states that her current pain medication management provider is over an hour away and she would like to be closer to home.  I informed patient of clinic policies and that I would need baseline UDS before prescribing.  Patient endorsed understanding.  Patient advised to call back or seek an in-person evaluation if the symptoms or condition worsens.  I am having Lisa Good maintain [name], amLODipine, atorvastatin, Buprenorphine HCl, celecoxib, fluticasone, gabapentin, and lisinopril. Pharmacotherapy (Medications Ordered): No orders of the defined types were placed in this encounter.  Orders:  Orders Placed This Encounter  Procedures  . CERVICAL FACET (MEDIAL BRANCH NERVE BLOCK)     Standing Status:   Future    Standing Expiration Date:   02/02/2019    Scheduling Instructions:     Side: Bilateral     Level: , C4-5, C5-6, C6-7 Facet joints (C4, C5, C6, & C7 Medial Branch Nerves)     Sedation: With Sedation.      Timeframe: ASAA    Order Specific Question:   Where will this procedure be performed?    Answer:   [facility]   Follow-up plan:   Return in about 4 weeks (around 01/31/2019) for Procedure.   Total duration of non-face-to-face encounter: 25 minutes.  Note by: Lisa Santa, MD Date: 01/03/2019; Time: 12:37 PM  Disclaimer:  * Given the special circumstances of the COVID-19 pandemic, the federal government has announced that the Office for Civil Rights (OCR) will exercise its enforcement discretion and will not impose penalties on physicians using telehealth in the event of noncompliance with regulatory requirements under the [place] and Accountability Act (HIPAA) in connection with the good faith provision of telehealth during the UYZJQ-96 national public health emergency. ([place])

## 2019-01-30 ENCOUNTER — Ambulatory Visit: Payer: Medicare Other | Admitting: Student in an Organized Health Care Education/Training Program

## 2019-03-03 ENCOUNTER — Inpatient Hospital Stay: Admission: RE | Admit: 2019-03-03 | Payer: Medicare Other | Source: Ambulatory Visit

## 2019-03-08 ENCOUNTER — Ambulatory Visit: Payer: Medicare Other | Admitting: Student in an Organized Health Care Education/Training Program

## 2019-03-08 ENCOUNTER — Other Ambulatory Visit: Payer: Self-pay

## 2019-03-08 ENCOUNTER — Other Ambulatory Visit
Admission: RE | Admit: 2019-03-08 | Discharge: 2019-03-08 | Disposition: A | Discharge status: Home / Self Care | Payer: Medicare Other | Source: Ambulatory Visit | Attending: Student in an Organized Health Care Education/Training Program | Admitting: Student in an Organized Health Care Education/Training Program

## 2019-03-08 DIAGNOSIS — Z01818 Encounter for other preprocedural examination: Secondary | ICD-10-CM

## 2019-03-08 DIAGNOSIS — Z1159 Encounter for screening for other viral diseases: Secondary | ICD-10-CM | POA: Diagnosis present

## 2019-03-09 LAB — NOVEL CORONAVIRUS, NAA (HOSP ORDER, SEND-OUT TO REF LAB; TAT 18-24 HRS): SARS-CoV-2, NAA: NOT DETECTED

## 2019-03-13 ENCOUNTER — Ambulatory Visit (HOSPITAL_BASED_OUTPATIENT_CLINIC_OR_DEPARTMENT_OTHER): Payer: Medicare Other | Admitting: Student in an Organized Health Care Education/Training Program

## 2019-03-13 ENCOUNTER — Other Ambulatory Visit: Payer: Self-pay

## 2019-03-13 ENCOUNTER — Ambulatory Visit
Admission: RE | Admit: 2019-03-13 | Discharge: 2019-03-13 | Disposition: A | Discharge status: Home / Self Care | Payer: Medicare Other | Source: Ambulatory Visit | Attending: Student in an Organized Health Care Education/Training Program | Admitting: Student in an Organized Health Care Education/Training Program

## 2019-03-13 ENCOUNTER — Encounter: Payer: Self-pay | Admitting: Student in an Organized Health Care Education/Training Program

## 2019-03-13 VITALS — BP 123/53 | HR 85 | Temp 98.1°F | Resp 16 | Ht 64.0 in | Wt 198.0 lb

## 2019-03-13 DIAGNOSIS — G894 Chronic pain syndrome: Secondary | ICD-10-CM | POA: Diagnosis present

## 2019-03-13 DIAGNOSIS — Z981 Arthrodesis status: Secondary | ICD-10-CM

## 2019-03-13 DIAGNOSIS — Z01818 Encounter for other preprocedural examination: Secondary | ICD-10-CM

## 2019-03-13 DIAGNOSIS — M47812 Spondylosis without myelopathy or radiculopathy, cervical region: Secondary | ICD-10-CM | POA: Insufficient documentation

## 2019-03-13 MED ORDER — ROPIVACAINE HCL 2 MG/ML IJ SOLN
2.0000 mL | Freq: Once | INTRAMUSCULAR | Status: AC
  Administered 2019-03-13: 10 mL via EPIDURAL
  Filled 2019-03-13: qty 10

## 2019-03-13 MED ORDER — FENTANYL CITRATE (PF) 100 MCG/2ML IJ SOLN
25.0000 ug | INTRAMUSCULAR | Status: DC | PRN
  Filled 2019-03-13: qty 2

## 2019-03-13 MED ORDER — ROPIVACAINE HCL 2 MG/ML IJ SOLN
1.0000 mL | Freq: Once | INTRAMUSCULAR | Status: AC
  Administered 2019-03-13: 10 mL via EPIDURAL
  Filled 2019-03-13: qty 10

## 2019-03-13 MED ORDER — DEXAMETHASONE SODIUM PHOSPHATE 10 MG/ML IJ SOLN
10.0000 mg | Freq: Once | INTRAMUSCULAR | Status: AC
  Administered 2019-03-13: 10 mg
  Filled 2019-03-13: qty 1

## 2019-03-13 MED ORDER — LIDOCAINE HCL 2 % IJ SOLN
20.0000 mL | Freq: Once | INTRAMUSCULAR | Status: AC
  Administered 2019-03-13: 400 mg
  Filled 2019-03-13: qty 20

## 2019-03-13 NOTE — Patient Instructions (Signed)

## 2019-03-13 NOTE — Progress Notes (Signed)
Back patient's Name: Lisa Good  MRN: [id number]  Referring Provider: [name], MD  DOB: 1951/06/30  PCP: [name], MD  DOS: 03/13/2019  Note by: Gillis Santa, MD  Service setting: Ambulatory outpatient  Specialty: Interventional Pain Management  Patient type: Established  Location: [facility] (AMB) Pain Management Facility  Visit type: Interventional Procedure   Primary Reason for Visit: Interventional Pain Management Treatment. CC: Neck Pain  Procedure:          Anesthesia, Analgesia, Anxiolysis:  Type: Cervical Facet Medial Branch Block(s)  #1  Primary Purpose: Diagnostic Region: Posterolateral cervical spine Level: C4, C5, C6, & C7 Medial Branch Level(s). Injecting these levels blocks the C3-4, C4-5, C5-6, and C6-7 cervical facet joints. Laterality: Bilateral  Type: Moderate (Conscious) Sedation combined with Local Anesthesia Indication(s): Analgesia and Anxiety Route: Intravenous (IV) IV Access: Secured Sedation: Meaningful verbal contact was maintained at all times during the procedure  Local Anesthetic: Lidocaine 1-2%  Position: Prone with head of the table raised to facilitate breathing.   Indications: 1. Cervical spondylosis without myelopathy   2. Chronic pain syndrome   3. Preoperative testing   4. History of fusion of lumbar spine    Pain Score: Pre-procedure: 5 /10 Post-procedure: 0-No pain/10  Pre-op Assessment:  Lisa Good is a 68 y.o. (year old), female patient, seen today for interventional treatment. She  has a past surgical history that includes Spine surgery; Joint replacement (Right); Tubal ligation; and Brain surgery (2002). Lisa Good has a current medication list which includes the following prescription(s): alpha-lipoic acid, amlodipine, atorvastatin, buprenorphine hcl, celecoxib, fluticasone, gabapentin, lisinopril, and venlafaxine xr, and the following Facility-Administered Medications: fentanyl. Her primarily concern today is the Neck  Pain  Initial Vital Signs:  Pulse/HCG Rate: 85ECG Heart Rate: 86 Temp: 98.4 F (36.9 C) Resp: 12 BP: (!) 125/59 SpO2: 98 %  BMI: Estimated body mass index is 33.99 kg/m as calculated from the following:   Height as of this encounter: 5' 4"  (1.626 m).   Weight as of this encounter: 198 lb (89.8 kg).  Risk Assessment: Allergies: Reviewed. She is allergic to contrast media [iodinated diagnostic agents].  Allergy Precautions: None required Coagulopathies: Reviewed. None identified.  Blood-thinner therapy: None at this time Active Infection(s): Reviewed. None identified. Lisa Good is afebrile  Site Confirmation: Lisa Good was asked to confirm the procedure and laterality before marking the site Procedure checklist: Completed Consent: Before the procedure and under the influence of no sedative(s), amnesic(s), or anxiolytics, the patient was informed of the treatment options, risks and possible complications. To fulfill our ethical and legal obligations, as recommended by the American Medical Association's Code of Ethics, I have informed the patient of my clinical impression; the nature and purpose of the treatment or procedure; the risks, benefits, and possible complications of the intervention; the alternatives, including doing nothing; the risk(s) and benefit(s) of the alternative treatment(s) or procedure(s); and the risk(s) and benefit(s) of doing nothing. The patient was provided information about the general risks and possible complications associated with the procedure. These may include, but are not limited to: failure to achieve desired goals, infection, bleeding, organ or nerve damage, allergic reactions, paralysis, and death. In addition, the patient was informed of those risks and complications associated to Spine-related procedures, such as failure to decrease pain; infection (i.e.: Meningitis, epidural or intraspinal abscess); bleeding (i.e.: epidural hematoma, subarachnoid  hemorrhage, or any other type of intraspinal or peri-dural bleeding); organ or nerve damage (i.e.: Any type of peripheral nerve, nerve  root, or spinal cord injury) with subsequent damage to sensory, motor, and/or autonomic systems, resulting in permanent pain, numbness, and/or weakness of one or several areas of the body; allergic reactions; (i.e.: anaphylactic reaction); and/or death. Furthermore, the patient was informed of those risks and complications associated with the medications. These include, but are not limited to: allergic reactions (i.e.: anaphylactic or anaphylactoid reaction(s)); adrenal axis suppression; blood sugar elevation that in diabetics may result in ketoacidosis or comma; water retention that in patients with history of congestive heart failure may result in shortness of breath, pulmonary edema, and decompensation with resultant heart failure; weight gain; swelling or edema; medication-induced neural toxicity; particulate matter embolism and blood vessel occlusion with resultant organ, and/or nervous system infarction; and/or aseptic necrosis of one or more joints. Finally, the patient was informed that Medicine is not an exact science; therefore, there is also the possibility of unforeseen or unpredictable risks and/or possible complications that may result in a catastrophic outcome. The patient indicated having understood very clearly. We have given the patient no guarantees and we have made no promises. Enough time was given to the patient to ask questions, all of which were answered to the patient's satisfaction. Lisa Good has indicated that she wanted to continue with the procedure. Attestation: I, the ordering provider, attest that I have discussed with the patient the benefits, risks, side-effects, alternatives, likelihood of achieving goals, and potential problems during recovery for the procedure that I have provided informed consent. Date   Time: 03/13/2019 11:30  AM  Pre-Procedure Preparation:  Monitoring: As per clinic protocol. Respiration, ETCO2, SpO2, BP, heart rate and rhythm monitor placed and checked for adequate function Safety Precautions: Patient was assessed for positional comfort and pressure points before starting the procedure. Time-out: I initiated and conducted the "Time-out" before starting the procedure, as per protocol. The patient was asked to participate by confirming the accuracy of the "Time Out" information. Verification of the correct person, site, and procedure were performed and confirmed by me, the nursing staff, and the patient. "Time-out" conducted as per Joint Commission's Universal Protocol (UP.01.01.01). Time: 1219  Description of Procedure:          Laterality: Bilateral. The procedure was performed in identical fashion on both sides. Level: C4, C5, C6, & C7 Medial Branch Level(s). Area Prepped: Posterior Cervico-thoracic Region Prepping solution: 79M DuraPrep (Iodine Povacrylex [0.7% available iodine] and Isopropyl Alcohol, 74% w/w) Safety Precautions: Aspiration looking for blood return was conducted prior to all injections. At no point did we inject any substances, as a needle was being advanced. Before injecting, the patient was told to immediately notify me if she was experiencing any new onset of "ringing in the ears, or metallic taste in the mouth". No attempts were made at seeking any paresthesias. Safe injection practices and needle disposal techniques used. Medications properly checked for expiration dates. SDV (single dose vial) medications used. After the completion of the procedure, all disposable equipment used was discarded in the proper designated medical waste containers. Local Anesthesia: Protocol guidelines were followed. The patient was positioned over the fluoroscopy table. The area was prepped in the usual manner. The time-out was completed. The target area was identified using fluoroscopy. A 12-in long,  straight, sterile hemostat was used with fluoroscopic guidance to locate the targets for each level blocked. Once located, the skin was marked with an approved surgical skin marker. Once all sites were marked, the skin (epidermis, dermis, and hypodermis), as well as deeper tissues (fat, connective tissue  and muscle) were infiltrated with a small amount of a short-acting local anesthetic, loaded on a 10cc syringe with a 25G, 1.5-in  Needle. An appropriate amount of time was allowed for local anesthetics to take effect before proceeding to the next step. Local Anesthetic: Lidocaine 2.0% The unused portion of the local anesthetic was discarded in the proper designated containers. Technical explanation of process:   C4 Medial Branch Nerve Block (MBB): The target area for the C4 dorsal medial articular branch is the lateral concave waist of the articular pillar of C4. Under fluoroscopic guidance, a Quincke needle was inserted until contact was made with os over the postero-lateral aspect of the articular pillar of C4 (target area). After negative aspiration for blood, 1 mL of the nerve block solution was injected without difficulty or complication. The needle was removed intact. C5 Medial Branch Nerve Block (MBB): The target area for the C5 dorsal medial articular branch is the lateral concave waist of the articular pillar of C5. Under fluoroscopic guidance, a Quincke needle was inserted until contact was made with os over the postero-lateral aspect of the articular pillar of C5 (target area). After negative aspiration for blood, 1 mL of the nerve block solution was injected without difficulty or complication. The needle was removed intact. C6 Medial Branch Nerve Block (MBB): The target area for the C6 dorsal medial articular branch is the lateral concave waist of the articular pillar of C6. Under fluoroscopic guidance, a Quincke needle was inserted until contact was made with os over the postero-lateral aspect of  the articular pillar of C6 (target area). After negative aspiration for blood,78m of the nerve block solution was injected without difficulty or complication. The needle was removed intact. C7 Medial Branch Nerve Block (MBB): The target for the C7 dorsal medial articular branch lies on the superior-medial tip of the C7 transverse process. Under fluoroscopic guidance, a Quincke needle was inserted until contact was made with os over the postero-lateral aspect of the articular pillar of C7 (target area). After negative aspiration for blood, 1 mL of the nerve block solution was injected without difficulty or complication. The needle was removed intact. Procedural Needles: 25-gauge, 3.5-inch, Quincke needles used for all levels.  Nerve block solution: 8 cc solution made of 6 cc of 0.2% ropivacaine, 2 cc of Decadron 10 mg/cc.  1 cc injected at each level above bilaterally.  Total steroid dose equals 20 mg of Decadron.     Once the entire procedure was completed, the treated area was cleaned, making sure to leave some of the prepping solution back to take advantage of its long term bactericidal properties.  Anatomy Reference Guide:       Vitals:   03/13/19 1247 03/13/19 1251 03/13/19 1258 03/13/19 1303  BP: (!) 146/62 (!) 147/73 (!) 143/59 (!) 147/54  Pulse:      Resp: 12 14 12 16   Temp:  98.1 F (36.7 C)    SpO2: 98% 97% 97% 96%  Weight:      Height:        Start Time: 1220 hrs. End Time: 1246 hrs.  Imaging Guidance (Spinal):          Type of Imaging Technique: Fluoroscopy Guidance (Spinal) Indication(s): Assistance in needle guidance and placement for procedures requiring needle placement in or near specific anatomical locations not easily accessible without such assistance. Exposure Time: Please see nurses notes. Contrast: None used. Fluoroscopic Guidance: I was personally present during the use of fluoroscopy. "Tunnel Vision Technique" used to  obtain the best possible view of the target  area. Parallax error corrected before commencing the procedure. "Direction-depth-direction" technique used to introduce the needle under continuous pulsed fluoroscopy. Once target was reached, antero-posterior, oblique, and lateral fluoroscopic projection used confirm needle placement in all planes. Images permanently stored in EMR. Interpretation: No contrast injected. I personally interpreted the imaging intraoperatively. Adequate needle placement confirmed in multiple planes. Permanent images saved into the patient's record.  Antibiotic Prophylaxis:   Anti-infectives (From admission, onward)   None     Indication(s): None identified  Post-operative Assessment:  Post-procedure Vital Signs:  Pulse/HCG Rate: 8582 Temp: 98.1 F (36.7 C) Resp: 16 BP: (!) 147/54 SpO2: 96 %  EBL: None  Complications: No immediate post-treatment complications observed by team, or reported by patient.  Note: The patient tolerated the entire procedure well. A repeat set of vitals were taken after the procedure and the patient was kept under observation following institutional policy, for this type of procedure. Post-procedural neurological assessment was performed, showing return to baseline, prior to discharge. The patient was provided with post-procedure discharge instructions, including a section on how to identify potential problems. Should any problems arise concerning this procedure, the patient was given instructions to immediately contact us, at any time, without hesitation. In any case, we plan to contact the patient by telephone for a follow-up status report regarding this interventional procedure.  Comments:  No additional relevant information.  Plan of Care  Orders:  Orders Placed This Encounter  Procedures   Novel Coronavirus, NAA ([facility])    Send patient to pre-admission testing for collection. Estimated turn-around time: 72 hrs.    Standing Status:   Future    Number of Occurrences:   1      Standing Expiration Date:   05/08/2019    Order Specific Question:   Known Exposure    Answer:   no   DG PAIN CLINIC C-ARM 1-60 MIN NO REPORT    Intraoperative interpretation by procedural physician at [place].    Standing Status:   Standing    Number of Occurrences:   1    Order Specific Question:   Reason for exam:    Answer:   Assistance in needle guidance and placement for procedures requiring needle placement in or near specific anatomical locations not easily accessible without such assistance.   Compliance Drug Analysis, Ur    Volume: 30 ml(s). Minimum 3 ml of urine is needed. Document temperature of fresh sample. Indications: Long term (current) use of opiate analgesic (Z79.891) Test#: [id number] (Comprehensive Profile)   UDS will be positive for fentanyl as the patient did receive fentanyl during her procedure.  Medications ordered for procedure: Meds ordered this encounter  Medications   fentaNYL (SUBLIMAZE) injection 25-50 mcg    Make sure Narcan is available in the pyxis when using this medication. In the event of respiratory depression (RR< 8/min): Titrate NARCAN (naloxone) in increments of 0.1 to 0.2 mg IV at 2-3 minute intervals, until desired degree of reversal.   lidocaine (XYLOCAINE) 2 % (with pres) injection 400 mg   dexamethasone (DECADRON) injection 10 mg   dexamethasone (DECADRON) injection 10 mg   ropivacaine (PF) 2 mg/mL (0.2%) (NAROPIN) injection 2 mL   ropivacaine (PF) 2 mg/mL (0.2%) (NAROPIN) injection 1 mL   Medications administered: We administered lidocaine, dexamethasone, dexamethasone, ropivacaine (PF) 2 mg/mL (0.2%), and ropivacaine (PF) 2 mg/mL (0.2%).  See the medical record for exact dosing, route, and time of administration.  The  Disposition: Discharge home  Discharge Date & Time: 03/13/2019; 1117 hrs.   Follow-up plan:   Return in about 4 weeks (around 04/10/2019) for Post Procedure Evaluation.     Future Appointments   Date Time Provider [place]  04/11/2019 10:00 AM Gillis Santa, MD [facility] None   Primary Care Physician: [name], MD Location: [facility] Note by: Gillis Santa, MD Date: 03/13/2019; Time: 1:22 PM  Disclaimer:  Medicine is not an exact science. The only guarantee in medicine is that nothing is guaranteed. It is important to note that the decision to proceed with this intervention was based on the information collected from the patient. The Data and conclusions were drawn from the patient's questionnaire, the interview, and the physical examination. Because the information was provided in large part by the patient, it cannot be guaranteed that it has not been purposely or unconsciously manipulated. Every effort has been made to obtain as much relevant data as possible for this evaluation. It is important to note that the conclusions that lead to this procedure are derived in large part from the available data. Always take into account that the treatment will also be dependent on availability of resources and existing treatment guidelines, considered by other Pain Management Practitioners as being common knowledge and practice, at the time of the intervention. For Medico-Legal purposes, it is also important to point out that variation in procedural techniques and pharmacological choices are the acceptable norm. The indications, contraindications, technique, and results of the above procedure should only be interpreted and judged by a Board-Certified Interventional Pain Specialist with extensive familiarity and expertise in the same exact procedure and technique.

## 2019-03-14 ENCOUNTER — Telehealth: Payer: Self-pay

## 2019-03-14 NOTE — Telephone Encounter (Signed)
Post procedure phone call.  States she is doing good.

## 2019-04-10 ENCOUNTER — Encounter: Payer: Self-pay | Admitting: Student in an Organized Health Care Education/Training Program

## 2019-04-11 ENCOUNTER — Other Ambulatory Visit: Payer: Self-pay

## 2019-04-11 ENCOUNTER — Ambulatory Visit
Payer: Medicare Other | Attending: Student in an Organized Health Care Education/Training Program | Admitting: Student in an Organized Health Care Education/Training Program

## 2019-04-11 ENCOUNTER — Encounter: Payer: Self-pay | Admitting: Student in an Organized Health Care Education/Training Program

## 2019-04-11 DIAGNOSIS — M47812 Spondylosis without myelopathy or radiculopathy, cervical region: Secondary | ICD-10-CM | POA: Diagnosis not present

## 2019-04-11 DIAGNOSIS — M4802 Spinal stenosis, cervical region: Secondary | ICD-10-CM

## 2019-04-11 DIAGNOSIS — Z981 Arthrodesis status: Secondary | ICD-10-CM

## 2019-04-11 DIAGNOSIS — M961 Postlaminectomy syndrome, not elsewhere classified: Secondary | ICD-10-CM

## 2019-04-11 DIAGNOSIS — G894 Chronic pain syndrome: Secondary | ICD-10-CM | POA: Diagnosis not present

## 2019-04-11 NOTE — Progress Notes (Signed)
Pain Management Virtual Encounter Note - Virtual Visit via Telephone Telehealth (real-time audio visits between healthcare provider and patient).   Patient's Phone No. & Preferred Pharmacy:  [phone] (home); There is no such number on file (mobile).; (Preferred) [phone] [name] [name], [place] [address] [place] [address] Phone: [phone] Fax: [phone]  [id number] [address]Phone: [phone]: [phone]   Pre-screening note:  Our staff contacted Ms. DAuroraand offered her an "in person", "face-to-face" appointment versus a telephone encounter. She indicated preferring the telephone encounter, at this time.   Reason for Virtual Visit: COVID-19*  Social distancing based on CDC and AMA recommendations.   I contacted Lisa Good on 04/11/2019 via telephone.      I clearly identified myself as BGillis Santa MD. I verified that I was speaking with the correct person using two identifiers (Name: Lisa Good and date of birth: 1August 26, 1952.  Advanced Informed Consent I sought verbal advanced consent from Lisa Hospitalfor virtual visit interactions. I informed Lisa Good of possible security and privacy concerns, risks, and limitations associated with providing "not-in-person" medical evaluation and management services. I also informed Lisa Good of the availability of "in-person" appointments. Finally, I informed her that there would be a charge for the virtual visit and that she could be  personally, fully or partially, financially responsible for it. Lisa Good understanding and agreed to proceed.   Historic Elements   Lisa Good a 68y.o. year old, female patient evaluated today after her last encounter by our practice on 03/14/2019. Lisa Good has a past  medical history of Allergy, Arthritis, Depression, GERD (gastroesophageal reflux disease), Hyperlipidemia, Hypertension, Neuromuscular disorder ([place], and Sleep apnea. She also  has a past surgical history that includes Spine surgery; Joint replacement (Right); Tubal ligation; and Brain surgery (2002). Lisa Good a current medication list which includes the following prescription(s): alpha-lipoic acid, amlodipine, atorvastatin, buprenorphine hcl, celecoxib, fluticasone, gabapentin, lisinopril, and venlafaxine xr. She  reports that she has never smoked. She has never used smokeless tobacco. She reports that she does not drink alcohol or use drugs. Lisa Good allergic to contrast media [iodinated diagnostic agents].   HPI  Today, she is being contacted for a post-procedure assessment.   Contacted patient for postprocedural evaluation status post cervical facet medial branch nerve block-bilateral C4, C5, C6, C7.  Patient states that she obtained approximately 40 to 50% pain relief in regards to her neck pain for 10 to 12 days after the procedure.  She has seen Dr. YCari Carawaywith neurosurgery since our procedure.  She states that she is scheduled for cervical spine surgery.  She has extensive spine surgery scheduled for August 17 which will be a C4-C5 corpectomy, C6-C7 ACDF, C6-T2 PCF.  Patient states that she is hesitant and concerned about surgery.  She is not sure whether she wants to get this done.  She is concerned about the postoperative recovery.  I informed the patient that she needs to reach out to Dr. YCari Carawayand have a detailed discussion with him about surgery, risks benefits and what she can expect in regards to postoperative functionality.  Patient endorsed understanding.  Patient would like to be considered for belbuca therapy here.  Will place order for urine drug screen.  As appropriate can take over.  If the patient decides to move forward with cervical spine surgery, I told her to  let me know if I have taken over her opioid regimen at that time.  She will need to wean off her belbuca and her pain will need to be managed on a full agonist given the extensive and painful nature of her upcoming cervical spine surgery.  Patient endorsed understanding.  Pharmacotherapy Assessment     02/20/2019  4   01/09/2019  Belbuca 600 MCG Film  60.00 30 Ke Coc   [id number]   Wal ([id number])   1  1.20 mg  Comm Ins   [place]     Monitoring: Pharmacotherapy: No side-effects or adverse reactions reported. [place] PMP: PDMP reviewed during this encounter.       Compliance: No problems identified. Effectiveness: Clinically acceptable. Plan: Refer to "POC".  Pertinent Labs   SAFETY SCREENING Profile Lab Results  Component Value Date   SARSCOV2NAA NOT DETECTED 03/08/2019   COVIDSOURCE NASOPHARYNGEAL 03/08/2019   Renal Function No results found for: BUN, CREATININE, BCR, GFRAA, GFRNONAA Hepatic Function No results found for: AST, ALT, ALBUMIN UDS No results found for: SUMMARY Note: Above Lab results reviewed.  Recent imaging  DG PAIN CLINIC C-ARM 1-60 MIN NO REPORT Fluoro was used, but no Radiologist interpretation will be provided.  Please refer to "NOTES" tab for provider progress note.  Assessment  The primary encounter diagnosis was Chronic pain syndrome. Diagnoses of Cervical spondylosis without myelopathy, History of fusion of lumbar spine, Foraminal stenosis of cervical region, Cervical stenosis of spinal canal, and Postlaminectomy syndrome of lumbosacral region were also pertinent to this visit.  Plan of Care  I am having Lisa Good maintain [name], amLODipine, atorvastatin, Buprenorphine HCl, celecoxib, fluticasone, gabapentin, lisinopril, and venlafaxine XR.  Patient instructed to notify us for follow-up appointment after she has completed her urine toxicology screen.  I informed the patient that if she decides to continue with surgery and if I have accepted her as  a patient, we will need to find an alternative to belbuca in the perioperative period as this is a mixed opioid agonist antagonist and will need to be weaned off prior to an extensive cervical spine surgery that she is scheduled for.  I instructed her to inform [name], who is her current prescribing physician (Pain MD) for belbuca regarding her planned cervical spine surgery so that they can consider weaning her off which is recommended when patient is on buprenorphine prior to surgery.  Orders:  Orders Placed This Encounter  Procedures  . Compliance Drug Analysis, Ur    Volume: 30 ml(s). Minimum 3 ml of urine is needed. Document temperature of fresh sample. Indications: Long term (current) use of opiate analgesic (K44.010) Test#: [id number] (Comprehensive Profile)   Follow-up plan:   Return for after UDS.    Recent Visits Date Type Provider Dept  03/13/19 Procedure visit Gillis Santa, MD [facility]  Showing recent visits within past 90 days and meeting all other requirements   Today's Visits Date Type Provider Dept  04/11/19 Office Visit Gillis Santa, MD [facility]  Showing today's visits and meeting all other requirements   Future Appointments No visits were found meeting these conditions.  Showing future appointments within next 90 days and meeting all other requirements   I discussed the assessment and treatment plan with the patient. The patient was provided an opportunity to ask questions and all were answered. The patient agreed with the plan and demonstrated  an understanding of the instructions.  Patient advised to call back or seek an in-person evaluation if the symptoms or condition worsens.  Total duration of non-face-to-face encounter: 25 minutes.  Note by: Gillis Santa, MD Date: 04/11/2019; Time: 1:29 PM  Note: This dictation was prepared with Dragon dictation. Any transcriptional errors that may result from this process are  unintentional.  Disclaimer:  * Given the special circumstances of the COVID-19 pandemic, the federal government has announced that the Office for Civil Rights (OCR) will exercise its enforcement discretion and will not impose penalties on physicians using telehealth in the event of noncompliance with regulatory requirements under the [place] and [place] (HIPAA) in connection with the Good faith provision of telehealth during the BNLWH-87 national public health emergency. ([place])

## 2019-04-17 ENCOUNTER — Other Ambulatory Visit
Admission: RE | Admit: 2019-04-17 | Discharge: 2019-04-17 | Disposition: A | Discharge status: Home / Self Care | Payer: Medicare Other | Source: Ambulatory Visit | Attending: Student in an Organized Health Care Education/Training Program | Admitting: Student in an Organized Health Care Education/Training Program

## 2019-04-18 ENCOUNTER — Other Ambulatory Visit: Payer: Self-pay | Admitting: Physician Assistant

## 2019-04-18 ENCOUNTER — Ambulatory Visit
Admission: RE | Admit: 2019-04-18 | Discharge: 2019-04-18 | Disposition: A | Discharge status: Home / Self Care | Payer: Medicare Other | Source: Ambulatory Visit | Attending: Physician Assistant | Admitting: Physician Assistant

## 2019-04-18 ENCOUNTER — Other Ambulatory Visit: Payer: Self-pay

## 2019-04-18 DIAGNOSIS — R319 Hematuria, unspecified: Secondary | ICD-10-CM | POA: Diagnosis not present

## 2019-04-18 IMAGING — CT CT RENAL STONE PROTOCOL
2 of 4 series · 17 of 46 positions shown, 19 images · non-contrast
Comparison: None.

CLINICAL DATA: Microscopic hematuria.

EXAM:
CT ABDOMEN AND PELVIS WITHOUT CONTRAST
TECHNIQUE: Multidetector CT imaging of the abdomen and pelvis was performed
following the standard protocol without IV contrast.

[Series 2: renal (person_name) · axial · 0.82mm/px · z∈[-1491,-1071]mm · 14 of 92 slices shown, 16 images]
[im 4/92  soft-tissue]
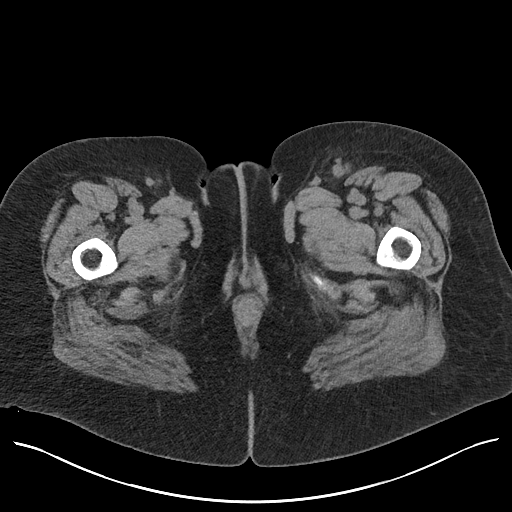
[im 4/92  bone]
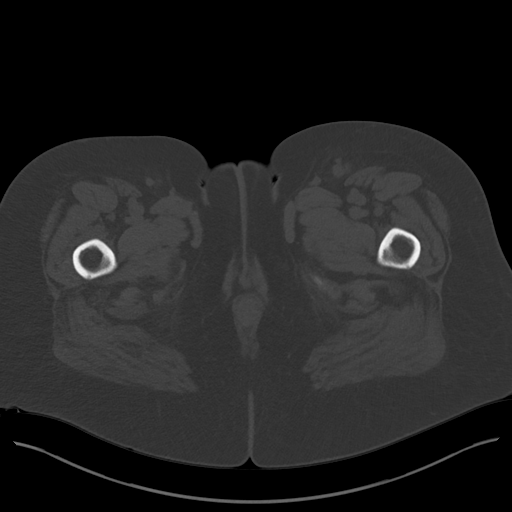
[im 12/92  soft-tissue]
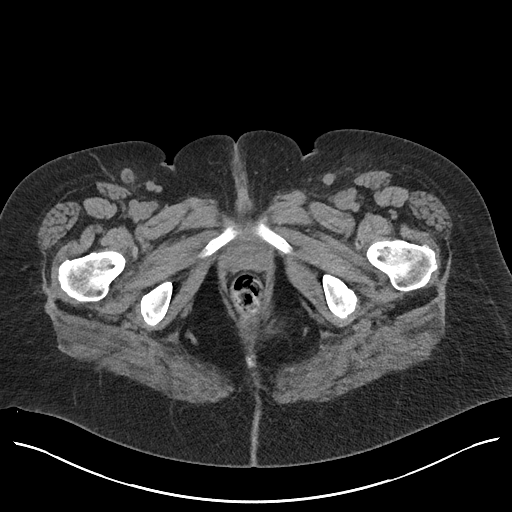
[im 16/92  soft-tissue]
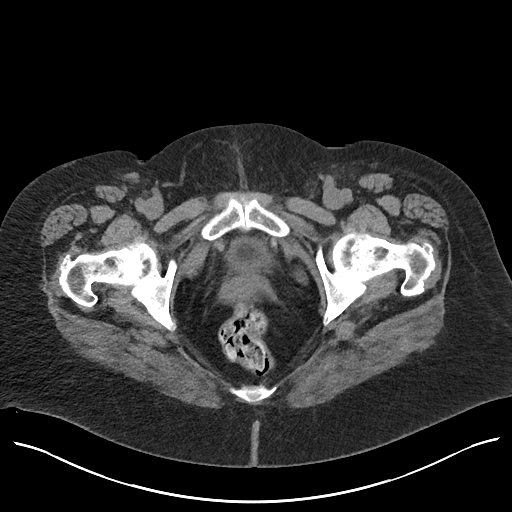
[im 24/92  soft-tissue]
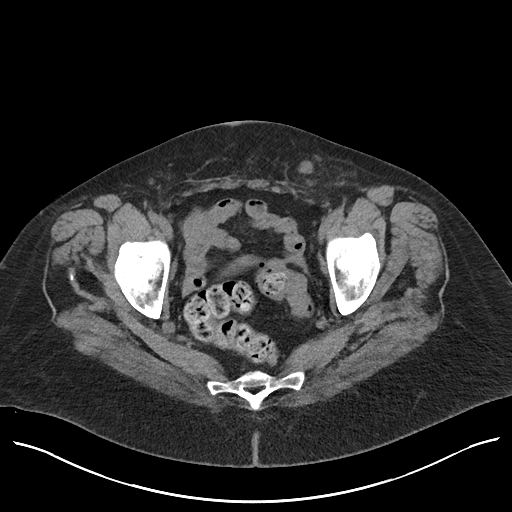
[im 32/92  soft-tissue]
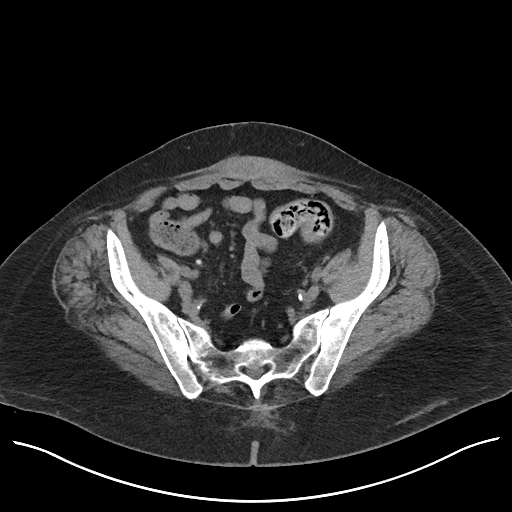
[im 36/92  soft-tissue]
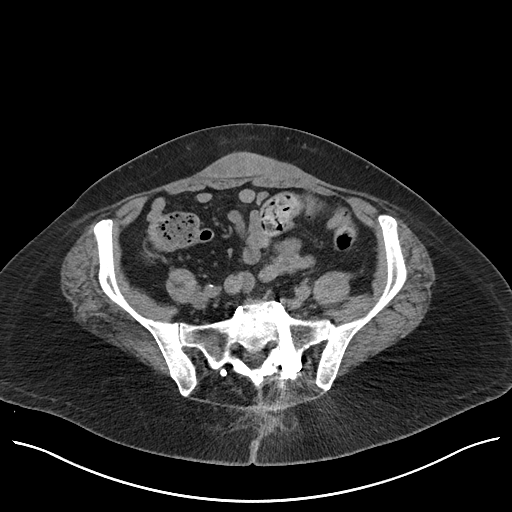
[im 44/92  soft-tissue]
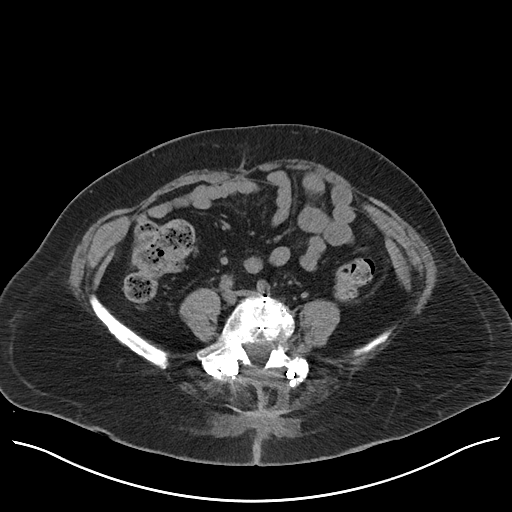
[im 48/92  soft-tissue]
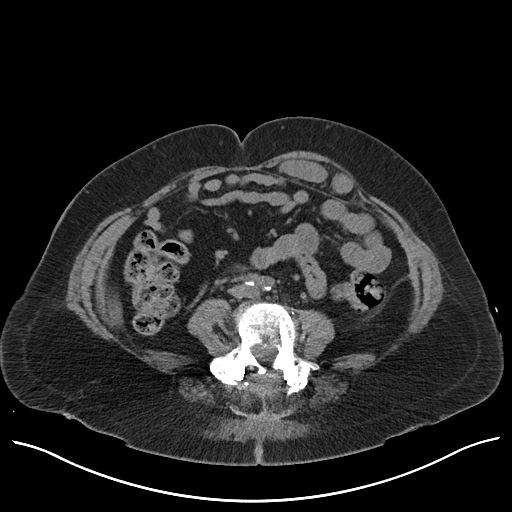
[im 56/92  soft-tissue]
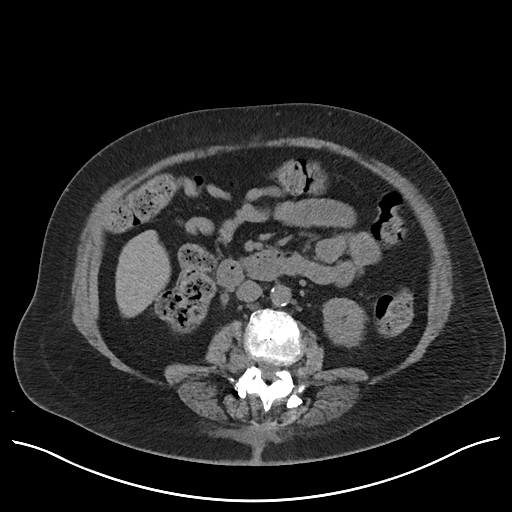
[im 56/92  bone]
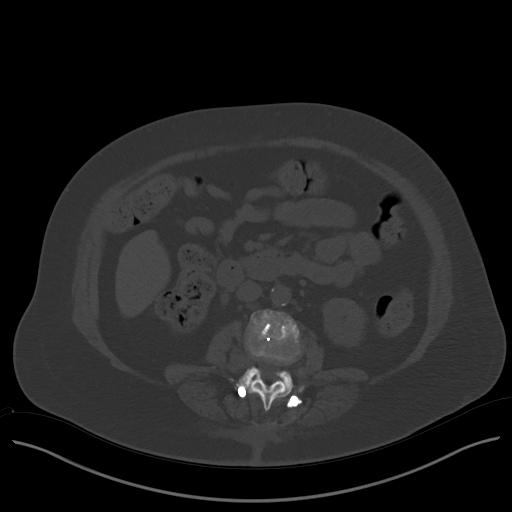
[im 60/92  soft-tissue]
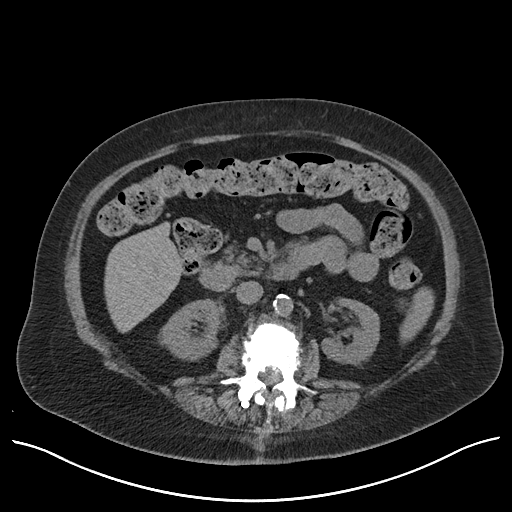
[im 68/92  soft-tissue]
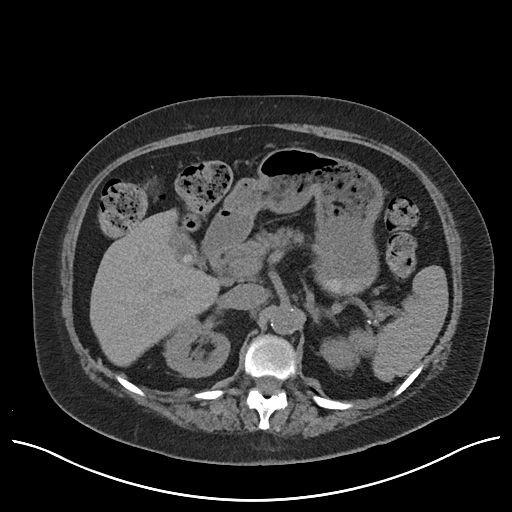
[im 76/92  soft-tissue]
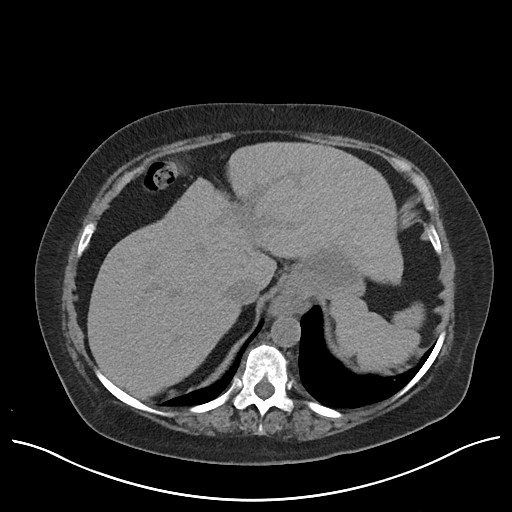
[im 80/92  soft-tissue]
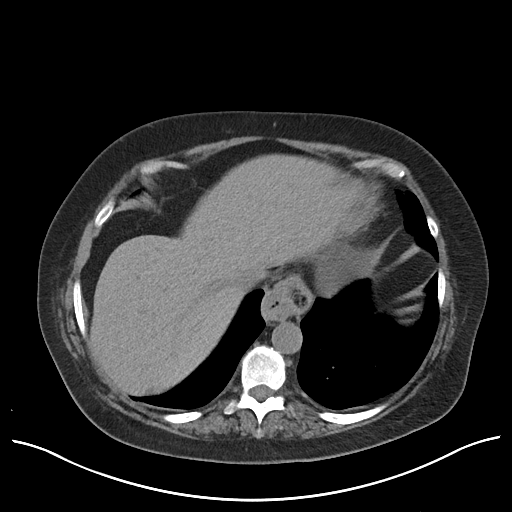
[im 88/92  soft-tissue]
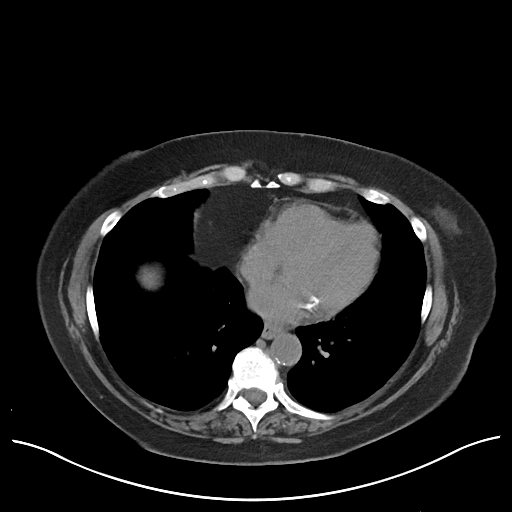

[Series 4: coronal renal (person_name) · coronal · 0.82mm/px · 3 of 154 slices shown]
[im 52/154  soft-tissue]
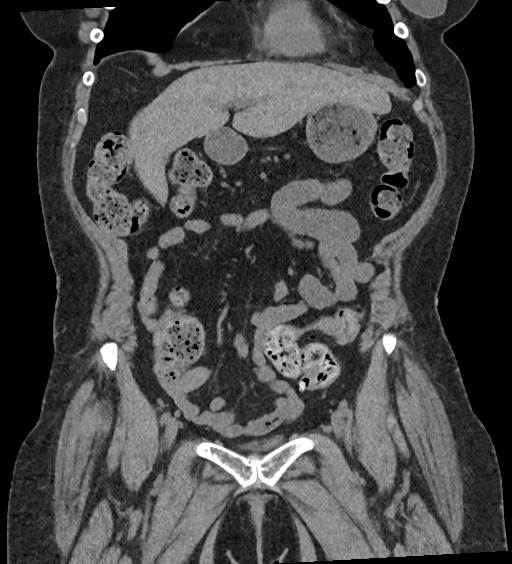
[im 69/154  soft-tissue]
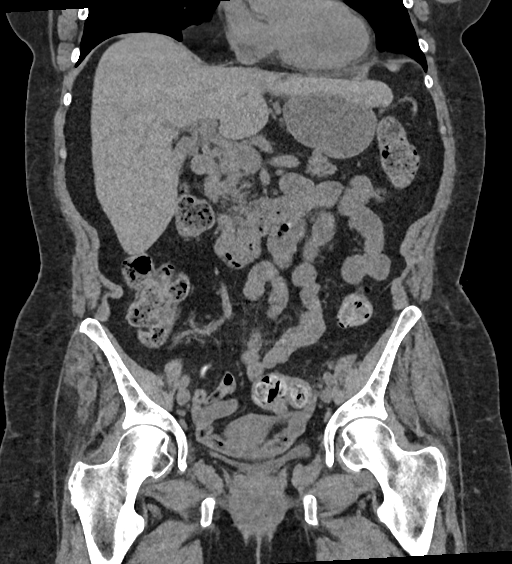
[im 86/154  soft-tissue]
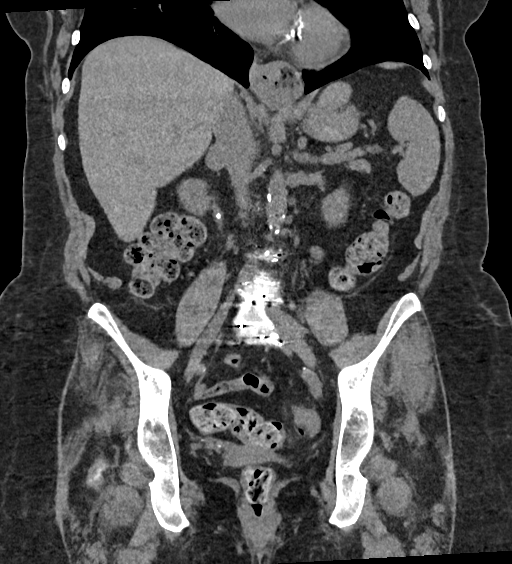

[17 of 46 positions shown; findings below may reference images not displayed]

FINDINGS: Lower chest: No acute abnormality.

Hepatobiliary: No focal liver lesion is identified. There is a
gallstone in the gallbladder. The biliary tree is normal.

Pancreas: Unremarkable. No pancreatic ductal dilatation or
surrounding inflammatory changes.

Spleen: Normal in size without focal abnormality.

Adrenals/Urinary Tract: The bilateral adrenal glands are normal.
There is right hydronephrosis due to obstruction by a 4 mm stone in
the proximal right ureter. There is no left hydronephrosis. No focal
renal lesion is noted. The bladder is normal.

Stomach/Bowel: Stomach is within normal limits. Appendix appears
normal. No evidence of bowel wall thickening, distention, or
inflammatory changes. Extensive bowel content is identified in the
colon.

Vascular/Lymphatic: Aortic atherosclerosis. No enlarged abdominal or
pelvic lymph nodes.

Reproductive: Uterus and bilateral adnexa are unremarkable.

Other: There are small lymph nodes in the bilateral inguinal region
largest measuring 8 mm in short axis, nonspecific.

Musculoskeletal: Status post prior posterior fusion of the lumbar
spine.
IMPRESSION: Right hydronephrosis due to obstruction by 4 mm stone in the
proximal right ureter.

## 2019-04-20 ENCOUNTER — Telehealth: Payer: Self-pay | Admitting: Radiology

## 2019-04-20 ENCOUNTER — Ambulatory Visit (INDEPENDENT_AMBULATORY_CARE_PROVIDER_SITE_OTHER): Payer: Medicare Other | Admitting: Urology

## 2019-04-20 ENCOUNTER — Encounter: Payer: Self-pay | Admitting: Urology

## 2019-04-20 ENCOUNTER — Ambulatory Visit
Admission: RE | Admit: 2019-04-20 | Discharge: 2019-04-20 | Disposition: A | Discharge status: Home / Self Care | Payer: Medicare Other | Source: Ambulatory Visit | Attending: Urology | Admitting: Urology

## 2019-04-20 ENCOUNTER — Other Ambulatory Visit: Payer: Self-pay

## 2019-04-20 ENCOUNTER — Other Ambulatory Visit: Payer: Self-pay | Admitting: Radiology

## 2019-04-20 ENCOUNTER — Ambulatory Visit: Payer: Medicare Other | Admitting: Urology

## 2019-04-20 VITALS — BP 142/79 | HR 115 | Ht 64.0 in | Wt 198.0 lb

## 2019-04-20 DIAGNOSIS — N201 Calculus of ureter: Secondary | ICD-10-CM

## 2019-04-20 LAB — URINALYSIS, COMPLETE
Bilirubin, UA: NEGATIVE
Glucose, UA: NEGATIVE
Nitrite, UA: NEGATIVE
Protein,UA: NEGATIVE
Specific Gravity, UA: 1.02 (ref 1.005–1.030)
Urobilinogen, Ur: 0.2 mg/dL (ref 0.2–1.0)
pH, UA: 5 (ref 5.0–7.5)

## 2019-04-20 LAB — MICROSCOPIC EXAMINATION: Bacteria, UA: NONE SEEN

## 2019-04-20 IMAGING — CR ABDOMEN - 1 VIEW
2 series · 2 of 2 positions shown · non-contrast
Comparison: CT abdomen and pelvis [DATE].

CLINICAL DATA: Persistent RIGHT flank pain. Proximal RIGHT ureteral
calculus identified on recent CT.

EXAM:
ABDOMEN - 1 VIEW

[abdomen kub (1 of 2)]
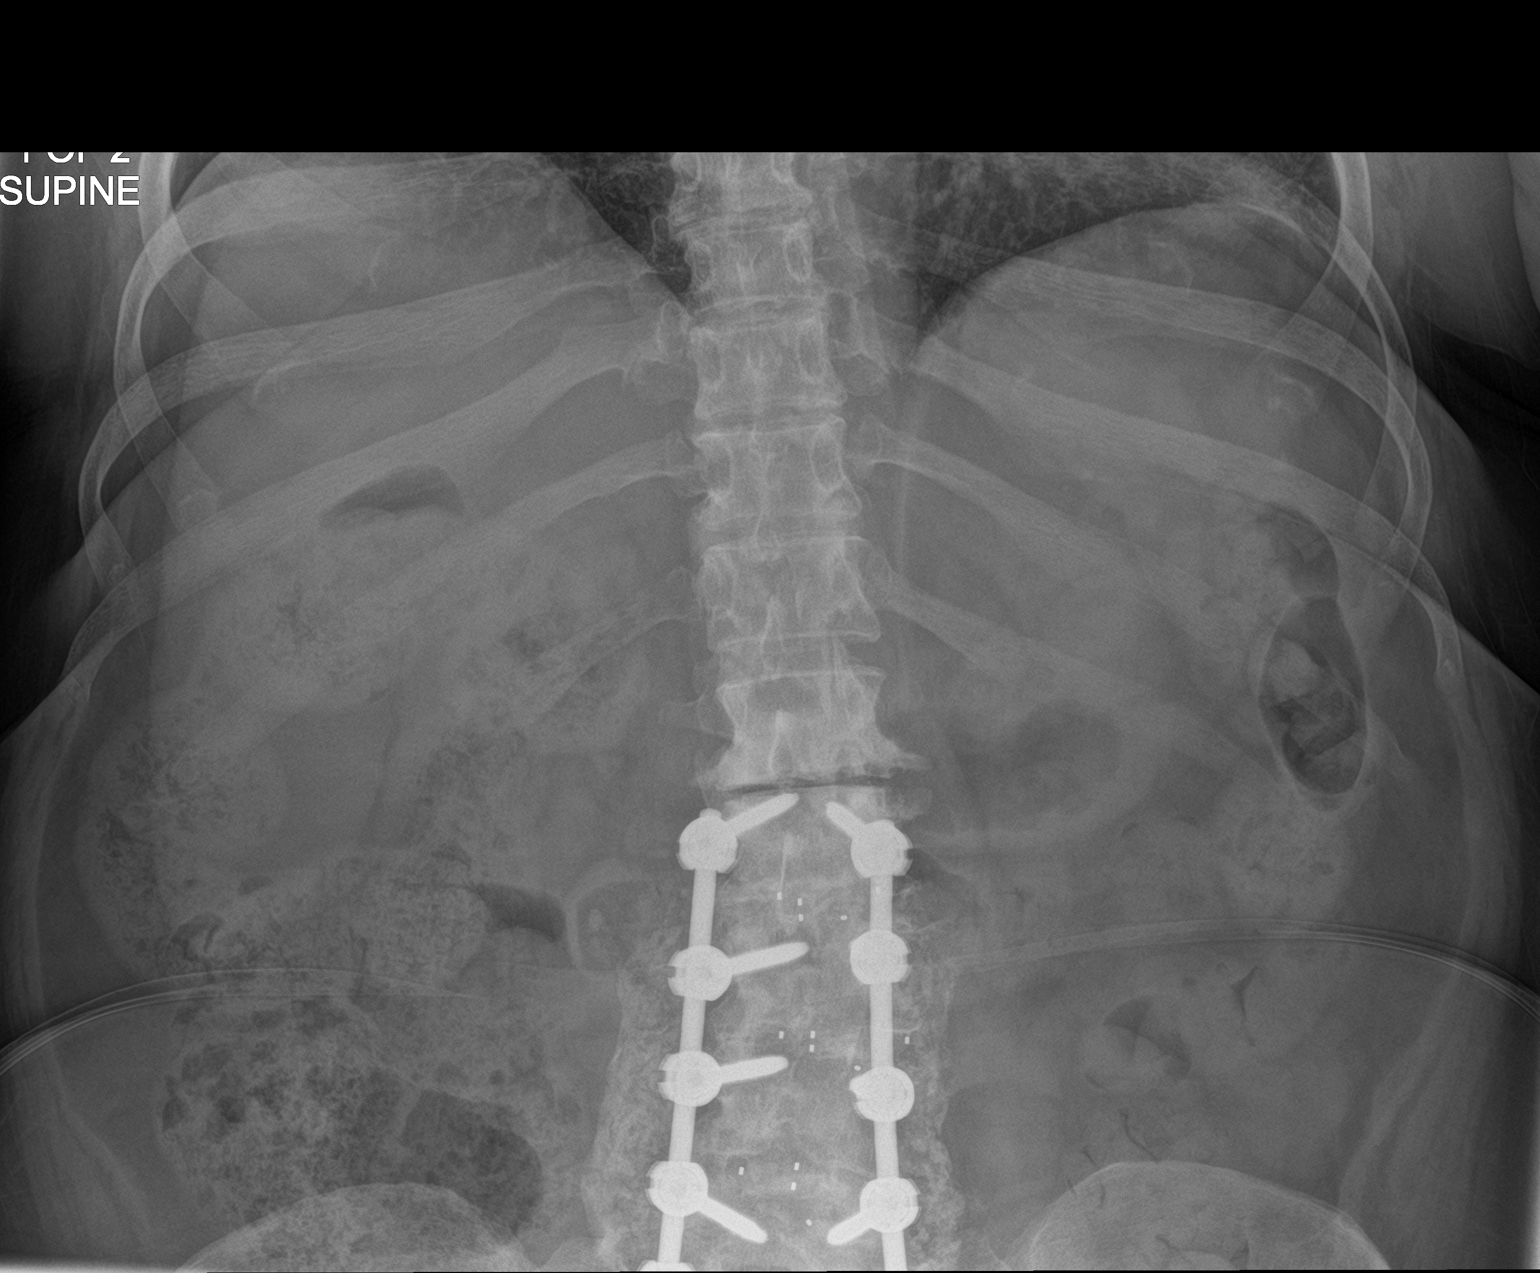

[abdomen kub (2 of 2)]
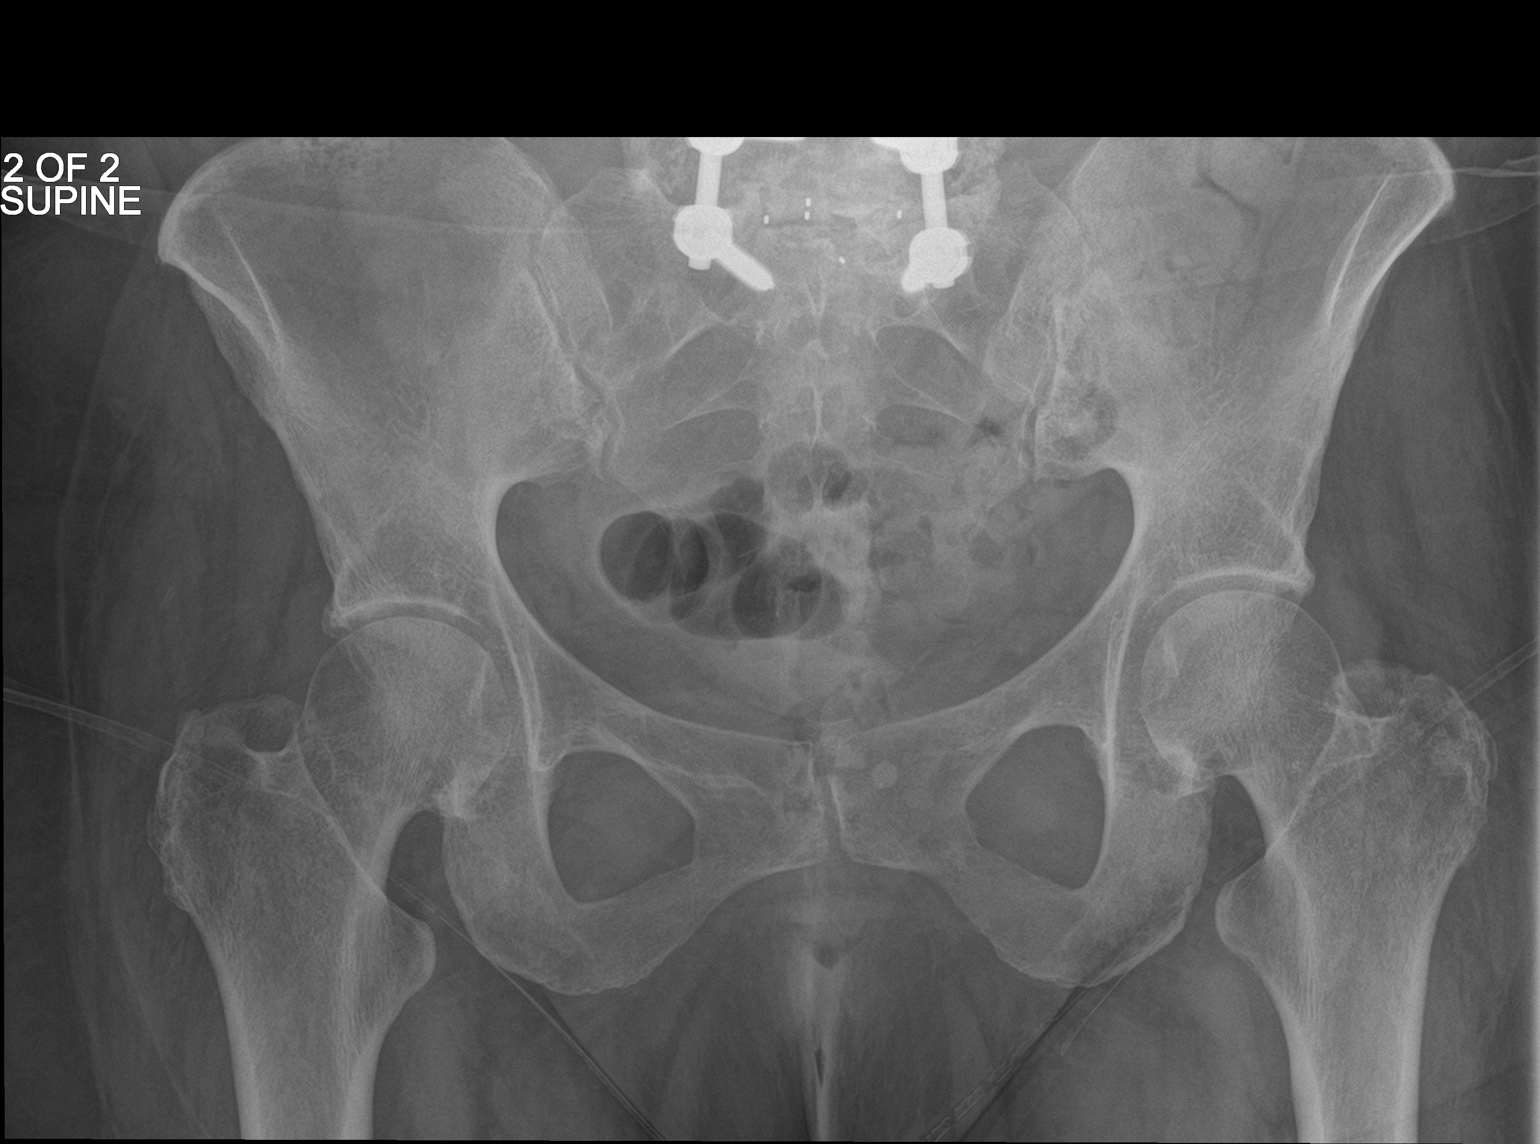

[2 of 2 positions shown; findings below may reference images not displayed]

FINDINGS: The 4 mm calculus in the proximal RIGHT ureter identified on the CT
2 days ago is unchanged in location. No visible opaque urinary tract
calculi elsewhere.

Moderate colonic stool burden. Normal bowel gas pattern. Prior L2
through S1 fusion. Severe degenerative disc disease at L1-2.
IMPRESSION: 4 mm proximal RIGHT ureteral calculus, unchanged in location since
the CT 2 days ago.

## 2019-04-20 NOTE — Progress Notes (Signed)
04/20/19 9:27 AM   Lisa Good 05/20/1951 [id number]  Referring provider: [name], MD [facility] [place],  [place] [address]  CC: Right renal colic  HPI: I saw Lisa Good in consultation from Dr. Bary Castilla today for right renal colic secondary to a 4 mm right proximal ureteral stone.  She is a 68 year old female with past medical history notable for chronic pain, fibromyalgia, and back surgery who presents with 1 week of intermittent severe right sided colicky low back pain.  CT stone protocol on 04/18/2019 showed a 4 mm right proximal ureteral stone with upstream hydronephrosis.  There were no renal stones.  She denies any fevers, chills, or urinary symptoms of dysuria, urgency, or frequency.  She had severe low back pain last night, however today her pain has been better controlled.  She takes chronic narcotics at baseline.  There are no aggravating factors.  She denies a prior history of nephrolithiasis.    Urine culture 6/25 showed no growth.  Urinalysis today shows no signs of infection was 0-5 WBCs, 0-2 RBCs, calcium oxalate crystals, no bacteria, nitrite negative.   PMH: Past Medical History:  Diagnosis Date  . Allergy    seasonal  . Arthritis   . Depression   . GERD (gastroesophageal reflux disease)   . Hyperlipidemia   . Hypertension   . Neuromuscular disorder ([place])    peripheral neuropathy/feet  . Sleep apnea     Surgical History: Past Surgical History:  Procedure Laterality Date  . 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 JOINT REPLACEMENT Right    knee  . SPINE SURGERY     spinal fusion  . TUBAL LIGATION      Allergies:  Allergies  Allergen Reactions  . Contrast Media [Iodinated Diagnostic Agents] Hives  . Cephalexin Nausea Only    nausea    Family History: No pertinent urologic family history  Social History:  reports that she has never smoked. She has never used smokeless tobacco. She reports that she does not  drink alcohol or use drugs.  ROS: Please see flowsheet from today's date for complete review of systems.  Physical Exam: BP (!) 142/79   Pulse (!) 115   Ht 5' 4"  (1.626 m)   Wt 198 lb (89.8 kg)   BMI 33.99 kg/m    Constitutional:  Alert and oriented, No acute distress. Cardiovascular: No clubbing, cyanosis, or edema. Respiratory: Normal respiratory effort, no increased work of breathing. GI: Abdomen is soft, nontender, nondistended, no abdominal masses GU: Right CVA tenderness Lymph: No cervical or inguinal lymphadenopathy. Skin: No rashes, bruises or suspicious lesions. Neurologic: Grossly intact, no focal deficits, moving all 4 extremities. Psychiatric: Normal mood and affect.  Laboratory Data: Reviewed see HPI  Pertinent Imaging: I have personally reviewed the CT stone protocol dated 04/18/2019.  Small ureteral stone, ~1000HU, 12.5cm SSD.  Stone not clearly seen on scout CT.  KUB today shows unchanged location of stone in proximal ureter, clearly visible  Assessment & Plan:   In summary, the patient is a 68 year old female with fibromyalgia and chronic pain with 1 week of intermittent right renal colic secondary to a non-infected 4 mm right proximal ureteral stone.  We discussed various treatment options for urolithiasis including observation with or without medical expulsive therapy, shockwave lithotripsy (SWL), and ureteroscopy and laser lithotripsy with stent placement.  We discussed that management is based on stone size, location, density, patient co-morbidities, and patient preference.   Stones <28m  in size have a >80% spontaneous passage rate. Data surrounding the use of tamsulosin for medical expulsive therapy is controversial, but meta analyses suggests it is most efficacious for distal stones between 5-30m in size. Possible side effects include dizziness/lightheadedness, and retrograde ejaculation.  SWL has a lower stone free rate in a single procedure, but also a  lower complication rate compared to ureteroscopy and avoids a stent and associated stent related symptoms. Possible complications include renal hematoma, steinstrasse, and need for additional treatment.  Ureteroscopy with laser lithotripsy and stent placement has a higher stone free rate than SWL in a single procedure, however increased complication rate including possible infection, ureteral injury, bleeding, and stent related morbidity. Common stent related symptoms include dysuria, urgency/frequency, and flank pain.  Trial of medical expulsive therapy this week, will schedule for shockwave lithotripsy Thursday 7/23 if she has not yet passed her stone. Continue Flomax and strain urine.  [facility] [address] [place] [phone]

## 2019-04-20 NOTE — Patient Instructions (Signed)
Renal Colic  Renal colic is pain that is caused by a kidney stone. The pain can be sharp and very bad. It may be felt in the back, belly, side (flank), or groin. It can cause nausea. Renal colic can come and go. Follow these instructions at home: Medicines  Take over-the-counter and prescription medicines only as told by your doctor.  Do not drive or use heavy machinery while taking prescription pain medicine. Eating and drinking   Drink enough fluid to keep your pee (urine) pale yellow. You may be told to drink at least 8-10 glasses of water each day. Follow instructions from your doctor.  If told, change your diet. This may include eating: ? Less salt (sodium). Eat less than 2 grams (2,000 mg) of salt per day. ? Less meat, poultry, fish, and eggs. ? More fruits and vegetables. ? Try not to eat spinach, rhubarb, sweet potatoes, or nuts.  Follow instructions from your doctor about what foods and drinks to avoid. General instructions  Keep all follow-up visits as told by your doctor. This is important.  Collect pee samples as told by your doctor.  Strain your pee every time you pee, as told by your doctor. Use the strainer that your doctor recommends.  Do not throw out the kidney stone after passing it. Keep the stone so it can be tested by your doctor. Contact a doctor if:  You have a fever or chills.  Your pee smells bad or looks cloudy.  You have pain or burning when you pee. Get help right away if:  The pain in your side (flank) or your groin suddenly gets worse.  You get confused.  You pass out. Summary  Renal colic is pain that is caused by a kidney stone.  Take over-the-counter and prescription medicines only as told by your doctor.  Drink enough fluid to keep your pee pale yellow. You may be told to drink at least 8-10 glasses of water each day. Follow instructions from your doctor.  Strain your pee every time you pee, as told by your doctor. Use the  strainer that your doctor recommends.  Do not throw out the kidney stone after passing it. Keep the stone so it can be tested by your doctor. This information is not intended to replace advice given to you by your health care provider. Make sure you discuss any questions you have with your health care provider. Document Released: 03/09/2008 Document Revised: 10/19/2017 Document Reviewed: 10/19/2017 Elsevier Patient Education  2020 [place] is a treatment that can sometimes help eliminate kidney stones and the pain that they cause. A form of lithotripsy, also known as extracorporeal shock wave lithotripsy, is a nonsurgical procedure that crushes a kidney stone with shock waves. These shock waves pass through your body and focus on the kidney stone. They cause the kidney stone to break up while it is still in the urinary tract. This makes it easier for the smaller pieces of stone to pass in the urine. Tell a health care provider about:  Any allergies you have.  All medicines you are taking, including vitamins, herbs, eye drops, creams, and over-the-counter medicines.  Any blood disorders you have.  Any surgeries you have had.  Any medical conditions you have.  Whether you are pregnant or may be pregnant.  Any problems you or family members have had with anesthetic medicines. What are the risks? Generally, this is a safe procedure. However, problems may occur, including:  Infection.  Bleeding of the kidney.  Bruising of the kidney or skin.  Scarring of the kidney, which can lead to: ? Increased blood pressure. ? Poor kidney function. ? Return (recurrence) of kidney stones.  Damage to other structures or organs, such as the liver, colon, spleen, or pancreas.  Blockage (obstruction) of the the tube that carries urine from the kidney to the bladder (ureter).  Failure of the kidney stone to break into pieces (fragments). What happens before the  procedure? Staying hydrated Follow instructions from your health care provider about hydration, which may include:  Up to 2 hours before the procedure - you may continue to drink clear liquids, such as water, clear fruit juice, black coffee, and plain tea. Eating and drinking restrictions Follow instructions from your health care provider about eating and drinking, which may include:  8 hours before the procedure - stop eating heavy meals or foods such as meat, fried foods, or fatty foods.  6 hours before the procedure - stop eating light meals or foods, such as toast or cereal.  6 hours before the procedure - stop drinking milk or drinks that contain milk.  2 hours before the procedure - stop drinking clear liquids. General instructions  Plan to have someone take you home from the hospital or clinic.  Ask your health care provider about: ? Changing or stopping your regular medicines. This is especially important if you are taking diabetes medicines or blood thinners. ? Taking medicines such as aspirin and ibuprofen. These medicines and other NSAIDs can thin your blood. Do not take these medicines for 7 days before your procedure if your health care provider instructs you not to.  You may have tests, such as: ? Blood tests. ? Urine tests. ? Imaging tests, such as a CT scan. What happens during the procedure?  To lower your risk of infection: ? Your health care team will wash or sanitize their hands. ? Your skin will be washed with soap.  An IV tube will be inserted into one of your veins. This tube will give you fluids and medicines.  You will be given one or more of the following: ? A medicine to help you relax (sedative). ? A medicine to make you fall asleep (general anesthetic).  A water-filled cushion may be placed behind your kidney or on your abdomen. In some cases you may be placed in a tub of lukewarm water.  Your body will be positioned in a way that makes it easy to  target the kidney stone.  A flexible tube with holes in it (stent) may be placed in the ureter. This will help keep urine flowing from the kidney if the fragments of the stone have been blocking the ureter.  An X-ray or ultrasound exam will be done to locate your stone.  Shock waves will be aimed at the stone. If you are awake, you may feel a tapping sensation as the shock waves pass through your body. The procedure may vary among health care providers and hospitals. What happens after the procedure?  You may have an X-ray to see whether the procedure was able to break up the kidney stone and how much of the stone has passed. If large stone fragments remain after treatment, you may need to have a second procedure at a later time.  Your blood pressure, heart rate, breathing rate, and blood oxygen level will be monitored until the medicines you were given have worn off.  You may be given  antibiotics or pain medicine as needed.  If a stent was placed in your ureter during surgery, it may stay in place for a few weeks.  You may need strain your urine to collect pieces of the kidney stone for testing.  You will need to drink plenty of water.  Do not drive for 24 hours if you were given a sedative. Summary  Lithotripsy is a treatment that can sometimes help eliminate kidney stones and the pain that they cause.  A form of lithotripsy, also known as extracorporeal shock wave lithotripsy, is a nonsurgical procedure that crushes a kidney stone with shock waves.  Generally, this is a safe procedure. However, problems may occur, including damage to the kidney or other organs, infection, or obstruction of the tube that carries urine from the kidney to the bladder (ureter).  When you go home, you will need to drink plenty of water. You may be asked to strain your urine to collect pieces of the kidney stone for testing. This information is not intended to replace advice given to you by your health  care provider. Make sure you discuss any questions you have with your health care provider. Document Released: 09/18/2000 Document Revised: 01/02/2019 Document Reviewed: 08/12/2016 Elsevier Patient Education  2020 [employer].

## 2019-04-20 NOTE — Telephone Encounter (Signed)
[facility] notifying patient that a COVID-19 test is required on 04/24/2019 between 8-10:30 prior to shockwave lithotripsy scheduled 04/27/2019.

## 2019-04-20 NOTE — Telephone Encounter (Signed)
Notified patient of extracorporeal shockwave lithotripsy scheduled on 04/27/2019 at 7:15. Patient should arrive at that time to [facility] Day Surgery. Pre-op & discharge instructions for lithotripsy were provided to patient.    Advised patient to hold NSAIDS & aspirin products for 3 days prior to the procedure beginning on 04/24/2019.  Patient's questions were answered & she expresses understanding of these instructions.    Ranell Patrick, RN

## 2019-04-21 NOTE — Telephone Encounter (Signed)
Confirmed with patient that COVID test must be done on 04/24/2019.

## 2019-04-23 LAB — CULTURE, URINE COMPREHENSIVE

## 2019-04-24 ENCOUNTER — Other Ambulatory Visit: Payer: Self-pay

## 2019-04-24 ENCOUNTER — Other Ambulatory Visit
Admission: RE | Admit: 2019-04-24 | Discharge: 2019-04-24 | Disposition: A | Discharge status: Home / Self Care | Payer: Medicare Other | Source: Ambulatory Visit | Attending: Urology | Admitting: Urology

## 2019-04-24 DIAGNOSIS — Z1159 Encounter for screening for other viral diseases: Secondary | ICD-10-CM | POA: Insufficient documentation

## 2019-04-25 LAB — SARS CORONAVIRUS 2 (TAT 6-24 HRS): SARS Coronavirus 2: NEGATIVE

## 2019-04-27 ENCOUNTER — Ambulatory Visit: Payer: Medicare Other | Admitting: Student in an Organized Health Care Education/Training Program

## 2019-04-27 ENCOUNTER — Ambulatory Visit: Payer: Medicare Other

## 2019-04-27 ENCOUNTER — Other Ambulatory Visit: Payer: Self-pay

## 2019-04-27 ENCOUNTER — Encounter
Admission: RE | Disposition: A | Discharge status: Home / Self Care | Payer: Self-pay | Source: Home / Self Care | Attending: Urology

## 2019-04-27 ENCOUNTER — Encounter: Payer: Self-pay | Admitting: Student in an Organized Health Care Education/Training Program

## 2019-04-27 ENCOUNTER — Encounter: Payer: Self-pay | Admitting: *Deleted

## 2019-04-27 ENCOUNTER — Ambulatory Visit
Admission: RE | Admit: 2019-04-27 | Discharge: 2019-04-27 | Disposition: A | Discharge status: Home / Self Care | Payer: Medicare Other | Attending: Urology | Admitting: Urology

## 2019-04-27 DIAGNOSIS — Z96651 Presence of right artificial knee joint: Secondary | ICD-10-CM | POA: Diagnosis not present

## 2019-04-27 DIAGNOSIS — N132 Hydronephrosis with renal and ureteral calculous obstruction: Secondary | ICD-10-CM | POA: Diagnosis not present

## 2019-04-27 DIAGNOSIS — I10 Essential (primary) hypertension: Secondary | ICD-10-CM | POA: Diagnosis not present

## 2019-04-27 DIAGNOSIS — G4730 Sleep apnea, unspecified: Secondary | ICD-10-CM | POA: Insufficient documentation

## 2019-04-27 DIAGNOSIS — N200 Calculus of kidney: Secondary | ICD-10-CM

## 2019-04-27 DIAGNOSIS — N201 Calculus of ureter: Secondary | ICD-10-CM

## 2019-04-27 HISTORY — PX: EXTRACORPOREAL SHOCK WAVE LITHOTRIPSY: SHX1557

## 2019-04-27 IMAGING — CR ABDOMEN - 1 VIEW DECUBITUS
1 series · 1 of 1 positions shown · non-contrast
Comparison: Abdominal x-ray dated [DATE]. CT abdomen pelvis
dated [DATE].

CLINICAL DATA: Right ureteral stone.

EXAM:
ABDOMEN - 1 VIEW DECUBITUS; ABDOMEN - 1 VIEW

[abdomen kub]
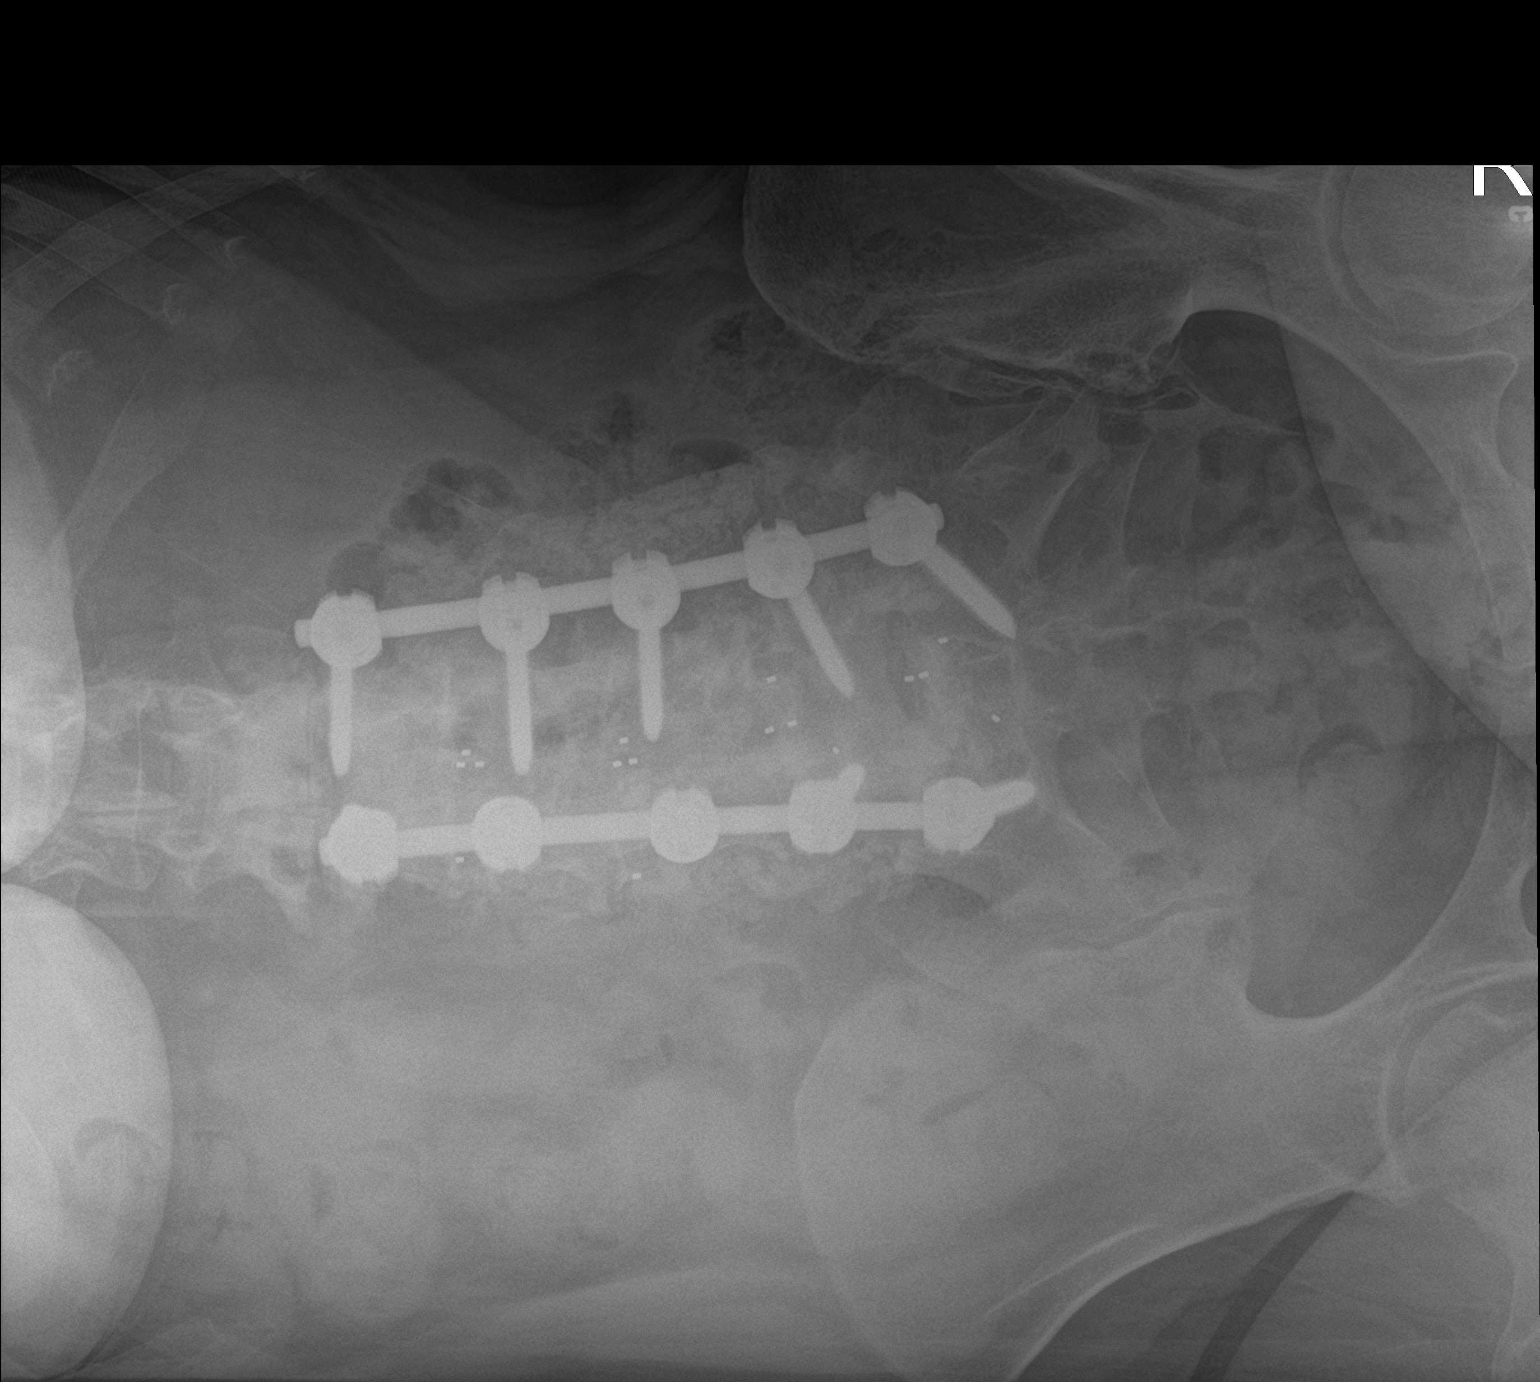

[1 of 1 positions shown; findings below may reference images not displayed]

FINDINGS: Unchanged 4 mm calculus in the proximal right ureter. No additional
calculi. Unchanged small calcification in the left pelvis,
corresponding to a dystrophic calcification in the posterior gluteal
soft tissues seen on prior CT. The bowel gas pattern is normal. No
acute osseous abnormality. Prior L2-S1 PLIF with adjacent segment
disease at L1-L2.
IMPRESSION: 1. Unchanged 4 mm calculus in the proximal right ureter.

## 2019-04-27 IMAGING — CR ABDOMEN - 1 VIEW
1 series · 1 of 1 positions shown · non-contrast
Comparison: Abdominal x-ray dated [DATE]. CT abdomen pelvis
dated [DATE].

CLINICAL DATA: Right ureteral stone.

EXAM:
ABDOMEN - 1 VIEW DECUBITUS; ABDOMEN - 1 VIEW

[abdomen kub]
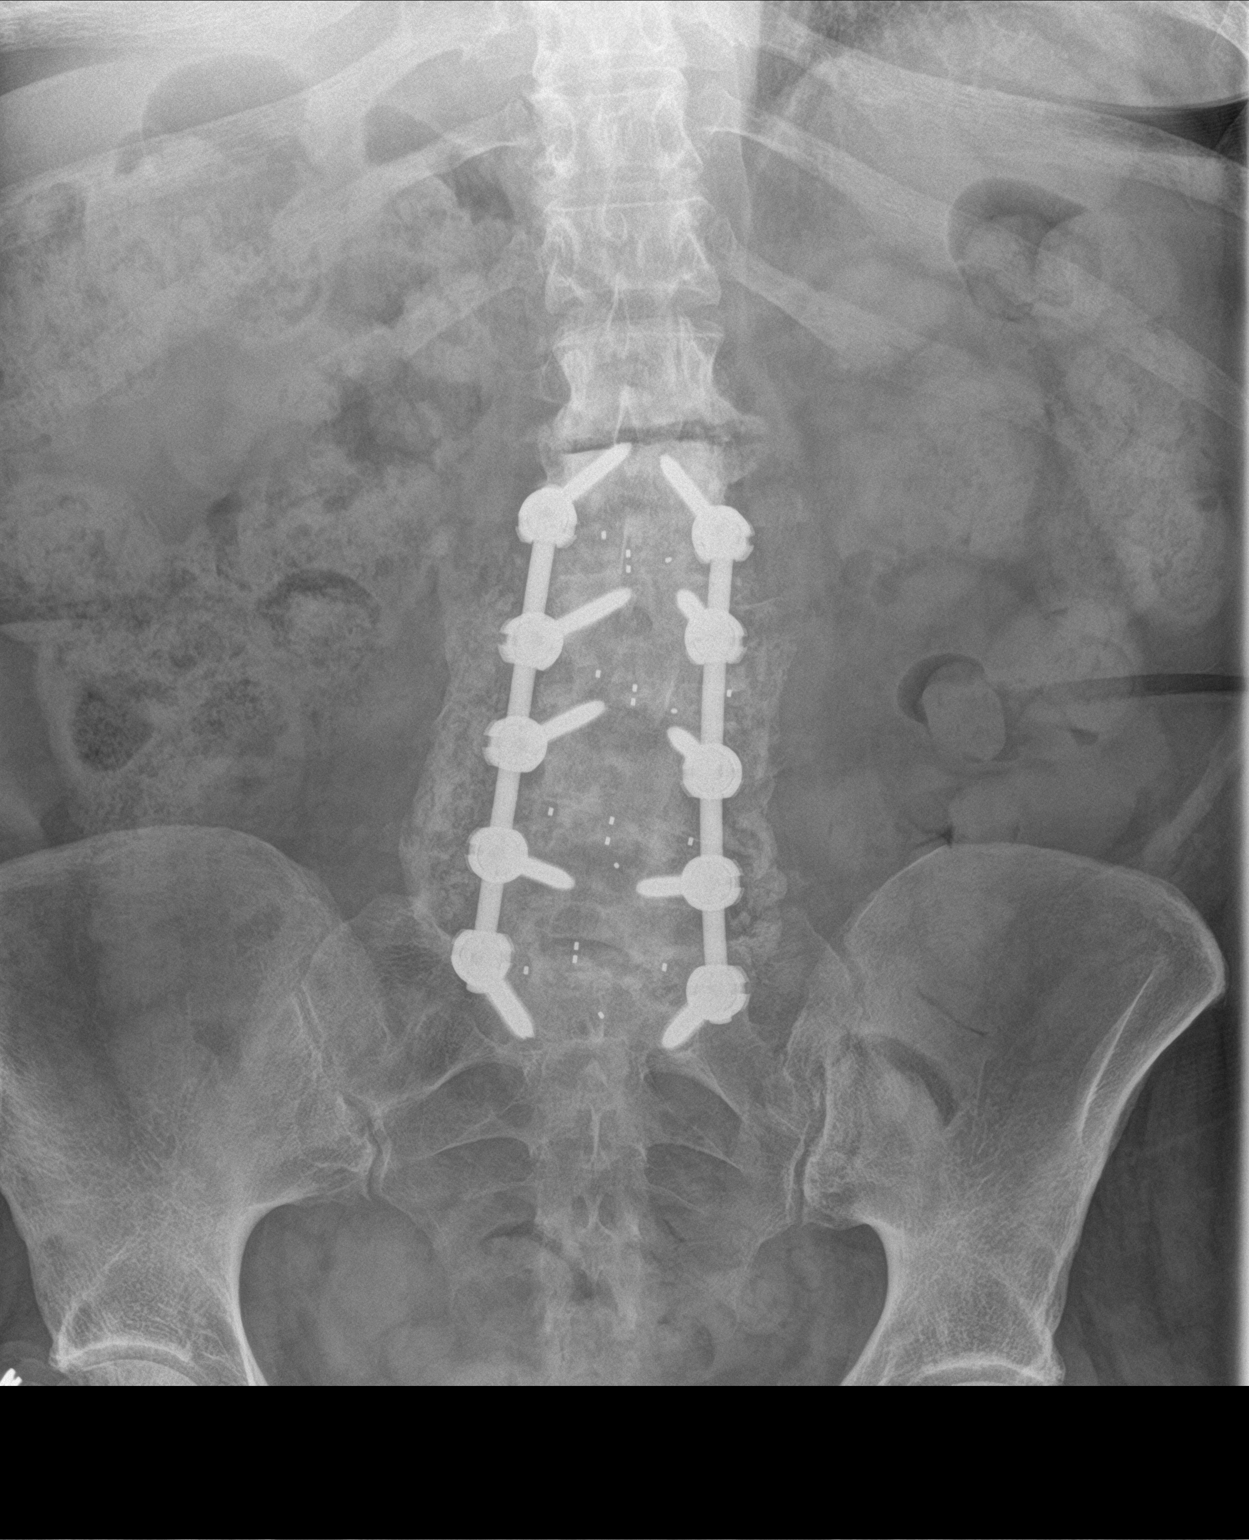

[1 of 1 positions shown; findings below may reference images not displayed]

FINDINGS: Unchanged 4 mm calculus in the proximal right ureter. No additional
calculi. Unchanged small calcification in the left pelvis,
corresponding to a dystrophic calcification in the posterior gluteal
soft tissues seen on prior CT. The bowel gas pattern is normal. No
acute osseous abnormality. Prior L2-S1 PLIF with adjacent segment
disease at L1-L2.
IMPRESSION: 1. Unchanged 4 mm calculus in the proximal right ureter.

## 2019-04-27 SURGERY — LITHOTRIPSY, ESWL
Anesthesia: Moderate Sedation | Laterality: Right

## 2019-04-27 MED ORDER — DIPHENHYDRAMINE HCL 25 MG PO CAPS: 25.0000 mg | ORAL_CAPSULE | ORAL | Status: DC

## 2019-04-27 MED ORDER — ONDANSETRON HCL 4 MG/2ML IJ SOLN
4.0000 mg | Freq: Once | INTRAMUSCULAR | Status: AC | PRN
  Administered 2019-04-27: 4 mg via INTRAVENOUS

## 2019-04-27 MED ORDER — TAMSULOSIN HCL 0.4 MG PO CAPS: 0.4000 mg | ORAL_CAPSULE | Freq: Every day | ORAL | 0 refills | Status: AC

## 2019-04-27 MED ORDER — ONDANSETRON HCL 4 MG/2ML IJ SOLN
INTRAMUSCULAR | Status: AC
  Administered 2019-04-27: 4 mg via INTRAVENOUS
  Filled 2019-04-27: qty 2

## 2019-04-27 MED ORDER — CIPROFLOXACIN HCL 500 MG PO TABS
ORAL_TABLET | ORAL | Status: AC
  Filled 2019-04-27: qty 1

## 2019-04-27 MED ORDER — DIPHENHYDRAMINE HCL 25 MG PO CAPS
ORAL_CAPSULE | ORAL | Status: AC
  Filled 2019-04-27: qty 1

## 2019-04-27 MED ORDER — ONDANSETRON HCL 2 MG/ML IV SOLN: 4.0000 mg | Freq: Once | INTRAVENOUS | Status: DC | PRN

## 2019-04-27 MED ORDER — DIAZEPAM 5 MG PO TABS: 10.0000 mg | ORAL_TABLET | ORAL | Status: DC

## 2019-04-27 MED ORDER — SODIUM CHLORIDE 0.9 % IV SOLN
INTRAVENOUS | Status: DC
  Administered 2019-04-27: 08:00:00 via INTRAVENOUS

## 2019-04-27 MED ORDER — CIPROFLOXACIN HCL 500 MG PO TABS
500.0000 mg | ORAL_TABLET | ORAL | Status: AC
  Administered 2019-04-27: 500 mg via ORAL

## 2019-04-27 MED ORDER — DIAZEPAM 5 MG PO TABS
ORAL_TABLET | ORAL | Status: AC
  Filled 2019-04-27: qty 2

## 2019-04-27 MED ORDER — CIPROFLOXACIN HCL 500 MG PO TABS
ORAL_TABLET | ORAL | Status: AC
  Administered 2019-04-27: 500 mg via ORAL
  Filled 2019-04-27: qty 1

## 2019-04-27 NOTE — Discharge Instructions (Signed)

## 2019-04-27 NOTE — Progress Notes (Signed)
Patient's BP 101/55 so I alerted Dr. Diamantina Providence he stated that he felt it was okay. He feels that it is the meds and for her to increase fluids and go straight home and rest. I will d/c patient and give her and her daughter instructions. The daughter is a Marine scientist.

## 2019-04-27 NOTE — H&P (Addendum)
UROLOGY H&P UPDATE  Agree with prior H&P dated 7/16. Patient with right flank pain secondary to a right 72m proximal ureteral stone. KUB today shows likely persistent stone, however a small opacity in bladder consistent with possible passed ureteral stone. Lateral KUB did not show a mobile stone in the bladder. She has not visualized a passed stone, and continues to have some back pain. Imaging on SWL unit clearly showed a 431mproximal ureteral stone, and will proceed with SWL.  Cardiac: RRR Lungs: CTA bilaterally  Laterality: Right Procedure: SWL  Urine: 7/16 5k mixed flora  Informed consent obtained, we specifically discussed the risks of bleeding, infection, post-operative pain, need for additional procedures, inability to visualize stone, steinstrasse, and obstructive fragments.   BrBilley CoMD 04/27/2019

## 2019-05-01 ENCOUNTER — Other Ambulatory Visit: Payer: Self-pay

## 2019-05-01 ENCOUNTER — Encounter: Payer: Self-pay | Admitting: Student in an Organized Health Care Education/Training Program

## 2019-05-01 ENCOUNTER — Ambulatory Visit
Payer: Medicare Other | Attending: Student in an Organized Health Care Education/Training Program | Admitting: Student in an Organized Health Care Education/Training Program

## 2019-05-01 DIAGNOSIS — G894 Chronic pain syndrome: Secondary | ICD-10-CM

## 2019-05-01 DIAGNOSIS — M47812 Spondylosis without myelopathy or radiculopathy, cervical region: Secondary | ICD-10-CM

## 2019-05-01 DIAGNOSIS — M4802 Spinal stenosis, cervical region: Secondary | ICD-10-CM

## 2019-05-01 DIAGNOSIS — Z981 Arthrodesis status: Secondary | ICD-10-CM | POA: Diagnosis not present

## 2019-05-01 DIAGNOSIS — M961 Postlaminectomy syndrome, not elsewhere classified: Secondary | ICD-10-CM

## 2019-05-01 MED ORDER — BELBUCA 600 MCG BU FILM: 1.0000 | ORAL_FILM | Freq: Two times a day (BID) | BUCCAL | 2 refills | Status: DC

## 2019-05-01 NOTE — Progress Notes (Signed)
Pain Management Virtual Encounter Note - Virtual Visit via Telephone Telehealth (real-time audio visits between healthcare provider and patient).   Patient's Phone No. & Preferred Pharmacy:  [phone] (home); There is no such number on file (mobile).; (Preferred) [phone] [name] Lisa [address], [address] [place] [address] Phone: [phone] Fax: [phone]  [id number] [place] [address]Phone: [phone]: [phone]   Pre-screening note:  Our staff contacted Ms. DClarkedaleand offered her an "in person", "face-to-face" appointment versus a telephone encounter. She indicated preferring the telephone encounter, at this time.   Reason for Virtual Visit: COVID-19*  Social distancing based on CDC and AMA recommendations.   I contacted Lisa Good on 05/01/2019 via telephone.      I clearly identified myself as BGillis Santa MD. I verified that I was speaking with the correct person using two identifiers (Name: SCornie Good and date of birth: 1July 27, 1952.  Advanced Informed Consent I sought verbal advanced consent from [facility] interactions. I informed Lisa Good of possible security and privacy concerns, risks, and limitations associated with providing "not-in-person" medical evaluation and management services. I also informed Lisa Good of the availability of "in-person" appointments. Finally, I informed her that there would be a charge for the virtual visit and that she could be  personally, fully or partially, financially responsible for it. [name] and agreed to proceed.   Historic Elements   Ms. SJavona Bergevinis a 68y.o. year old, female patient evaluated today after her last encounter by our practice on 04/11/2019. Lisa Good has a past  medical history of Allergy, Arthritis, Depression, GERD (gastroesophageal reflux disease), Hyperlipidemia, Hypertension, Neuromuscular disorder (HFrancisco, and Sleep apnea. She also  has a past surgical history that includes Spine surgery; Joint replacement (Right); Tubal ligation; Brain surgery (2002); and Extracorporeal shock wave lithotripsy (Right, 04/27/2019). Ms. DJohnsenhas a current medication list which includes the following prescription(s): alpha-lipoic acid, amlodipine, atorvastatin, celecoxib, fluticasone, gabapentin, lisinopril, tamsulosin, venlafaxine xr, and belbuca. She  reports that she has never smoked. She has never used smokeless tobacco. She reports that she does not drink alcohol or use drugs. Ms. DHogsettis allergic to contrast media [iodinated diagnostic agents] and cephalexin.   HPI  Today, she is being contacted for medication management.   Patient has come to the hospital twice to submit urine which she has on 2 occasions but for some reason the results are still not in.  I have very little concern regarding this patient's compliance issues.  She states that she does have a cough and low-grade fever that started a couple of days ago.  She has an appointment with her PCP this upcoming Wednesday.  I informed her that when the patient is feeling better, she can come to the hospital to sign her opioid contract and also repeat her urine.  Patient endorsed understanding.  We will take over belbuca at 600 mcg twice daily which was her previous chronic opioid dose.  Pharmacotherapy Assessment  Analgesic: Belbuca 600 mcg BID}   Monitoring: Pharmacotherapy: No side-effects or adverse reactions reported. [place] PMP: PDMP reviewed during this encounter.       Compliance: No problems identified. Effectiveness: Clinically acceptable. Plan: Refer to "POC".  Pertinent Labs   SAFETY SCREENING Profile Lab Results  Component Value Date  Benson NEGATIVE 04/24/2019   COVIDSOURCE  NASOPHARYNGEAL 03/08/2019   Renal Function No results found for: BUN, CREATININE, BCR, GFRAA, GFRNONAA Hepatic Function No results found for: AST, ALT, ALBUMIN UDS No results found for: SUMMARY Note: Above Lab results reviewed.  Recent imaging  DG Abd Decub CLINICAL DATA:  Right ureteral stone.  EXAM: ABDOMEN - 1 VIEW DECUBITUS; ABDOMEN - 1 VIEW  COMPARISON:  Abdominal x-ray dated April 20, 2019. CT abdomen pelvis dated April 18, 2019.  FINDINGS: Unchanged 4 mm calculus in the proximal right ureter. No additional calculi. Unchanged small calcification in the left pelvis, corresponding to a dystrophic calcification in the posterior gluteal soft tissues seen on prior CT. The bowel gas pattern is normal. No acute osseous abnormality. Prior L2-S1 PLIF with adjacent segment disease at L1-L2.  IMPRESSION: 1. Unchanged 4 mm calculus in the proximal right ureter.  Electronically Signed   By: Titus Dubin M.D.   On: 04/27/2019 08:32 DG Abd 1 View CLINICAL DATA:  Right ureteral stone.  EXAM: ABDOMEN - 1 VIEW DECUBITUS; ABDOMEN - 1 VIEW  COMPARISON:  Abdominal x-ray dated April 20, 2019. CT abdomen pelvis dated April 18, 2019.  FINDINGS: Unchanged 4 mm calculus in the proximal right ureter. No additional calculi. Unchanged small calcification in the left pelvis, corresponding to a dystrophic calcification in the posterior gluteal soft tissues seen on prior CT. The bowel gas pattern is normal. No acute osseous abnormality. Prior L2-S1 PLIF with adjacent segment disease at L1-L2.  IMPRESSION: 1. Unchanged 4 mm calculus in the proximal right ureter.  Electronically Signed   By: Titus Dubin M.D.   On: 04/27/2019 08:32  Assessment  The primary encounter diagnosis was Chronic pain syndrome. Diagnoses of Cervical spondylosis without myelopathy, History of fusion of lumbar spine, Foraminal stenosis of cervical region, Cervical stenosis of spinal canal, and Postlaminectomy  syndrome of lumbosacral region were also pertinent to this visit.  Plan of Care  I have discontinued [name]'s Buprenorphine HCl. I am also having her start on Belbuca. Additionally, I am having her maintain [name], amLODipine, atorvastatin, celecoxib, fluticasone, gabapentin, lisinopril, venlafaxine XR, and tamsulosin. General Recommendations: The pain condition that the patient suffers from is best treated with a multidisciplinary approach that involves an increase in physical activity to prevent de-conditioning and worsening of the pain cycle, as well as psychological counseling (formal and/or informal) to address the co-morbid psychological affects of pain. Treatment will often involve judicious use of pain medications and interventional procedures to decrease the pain, allowing the patient to participate in the physical activity that will ultimately produce long-lasting pain reductions. The goal of the multidisciplinary approach is to return the patient to a higher level of overall function and to restore their ability to perform activities of daily living.  If the patient decides to move forward with cervical spine surgery, I told her to let me know (tenatively scheduled for end of Oct) She will need to wean off her belbuca and her pain will need to be managed on a full agonist given the extensive and painful nature of her upcoming cervical spine surgery.  Patient endorsed understanding.  Pharmacotherapy (Medications Ordered): Meds ordered this encounter  Medications  . Buprenorphine HCl (BELBUCA) 600 MCG FILM    Sig: Place 1 Film inside cheek 2 (two) times a day.    Dispense:  60 each    Refill:  2   Orders:  Orders Placed This Encounter  Procedures  . Compliance Drug Analysis, Ur  Volume: 30 ml(s). Minimum 3 ml of urine is needed. Document temperature of fresh sample. Indications: Long term (current) use of opiate analgesic (X10.626) Test#: [id number] (Comprehensive  Profile)   Follow-up plan:   Return in about 10 weeks (around 07/10/2019) for Medication Management (need UDS).      Recent Visits Date Type Provider Dept  04/11/19 Office Visit Gillis Santa, MD [facility]  03/13/19 Procedure visit Gillis Santa, MD [facility]  Showing recent visits within past 90 days and meeting all other requirements   Today's Visits Date Type Provider Dept  05/01/19 Office Visit Gillis Santa, MD [facility]  Showing today's visits and meeting all other requirements   Future Appointments No visits were found meeting these conditions.  Showing future appointments within next 90 days and meeting all other requirements   I discussed the assessment and treatment plan with the patient. The patient was provided an opportunity to ask questions and all were answered. The patient agreed with the plan and demonstrated an understanding of the instructions.  Patient advised to call back or seek an in-person evaluation if the symptoms or condition worsens.  Total duration of non-face-to-face encounter: 57mnutes.  Note by: BGillis Santa MD Date: 05/01/2019; Time: 1:39 PM  Note: This dictation was prepared with Dragon dictation. Any transcriptional errors that may result from this process are unintentional.  Disclaimer:  * Given the special circumstances of the COVID-19 pandemic, the federal government has announced that the Office for Civil Rights (OCR) will exercise its enforcement discretion and will not impose penalties on physicians using telehealth in the event of noncompliance with regulatory requirements under the [place](HIPAA) in connection with the good faith provision of telehealth during the CEVOJJ-00national public health emergency. ([place]

## 2019-05-11 ENCOUNTER — Other Ambulatory Visit: Payer: Self-pay | Admitting: Radiology

## 2019-05-11 DIAGNOSIS — N201 Calculus of ureter: Secondary | ICD-10-CM

## 2019-05-15 ENCOUNTER — Ambulatory Visit
Admission: RE | Admit: 2019-05-15 | Discharge: 2019-05-15 | Disposition: A | Discharge status: Home / Self Care | Payer: Medicare Other | Source: Ambulatory Visit | Attending: Urology | Admitting: Urology

## 2019-05-15 ENCOUNTER — Other Ambulatory Visit: Payer: Self-pay

## 2019-05-15 ENCOUNTER — Other Ambulatory Visit: Payer: Self-pay | Admitting: Student in an Organized Health Care Education/Training Program

## 2019-05-15 ENCOUNTER — Encounter: Payer: Self-pay | Admitting: Urology

## 2019-05-15 ENCOUNTER — Ambulatory Visit (INDEPENDENT_AMBULATORY_CARE_PROVIDER_SITE_OTHER): Payer: Medicare Other | Admitting: Urology

## 2019-05-15 VITALS — BP 105/69 | HR 116 | Ht 64.0 in | Wt 203.0 lb

## 2019-05-15 DIAGNOSIS — N201 Calculus of ureter: Secondary | ICD-10-CM

## 2019-05-15 DIAGNOSIS — N3281 Overactive bladder: Secondary | ICD-10-CM

## 2019-05-15 DIAGNOSIS — N200 Calculus of kidney: Secondary | ICD-10-CM

## 2019-05-15 DIAGNOSIS — G894 Chronic pain syndrome: Secondary | ICD-10-CM

## 2019-05-15 IMAGING — CR ABDOMEN - 1 VIEW
2 series · 2 of 2 positions shown · non-contrast
Comparison: [DATE].

CLINICAL DATA: Right ureteral stone.

EXAM:
ABDOMEN - 1 VIEW

[abdomen kub (1 of 2)]
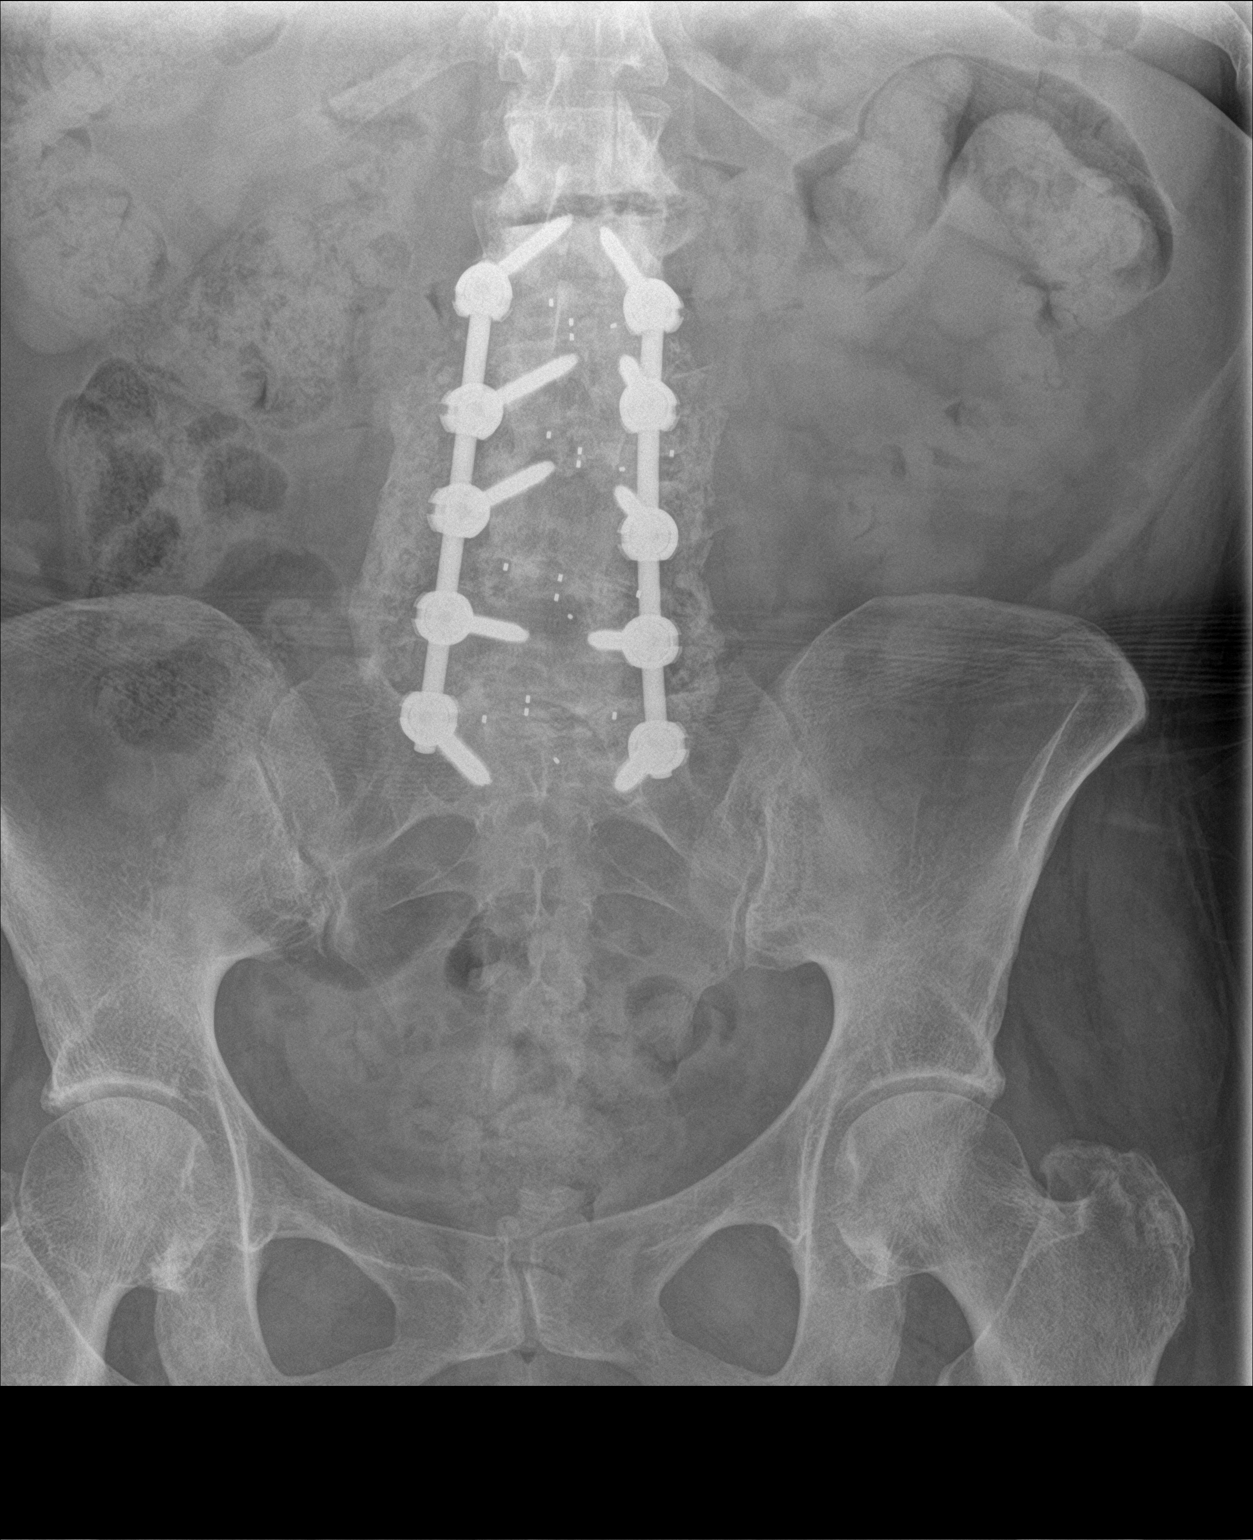

[abdomen kub (2 of 2)]
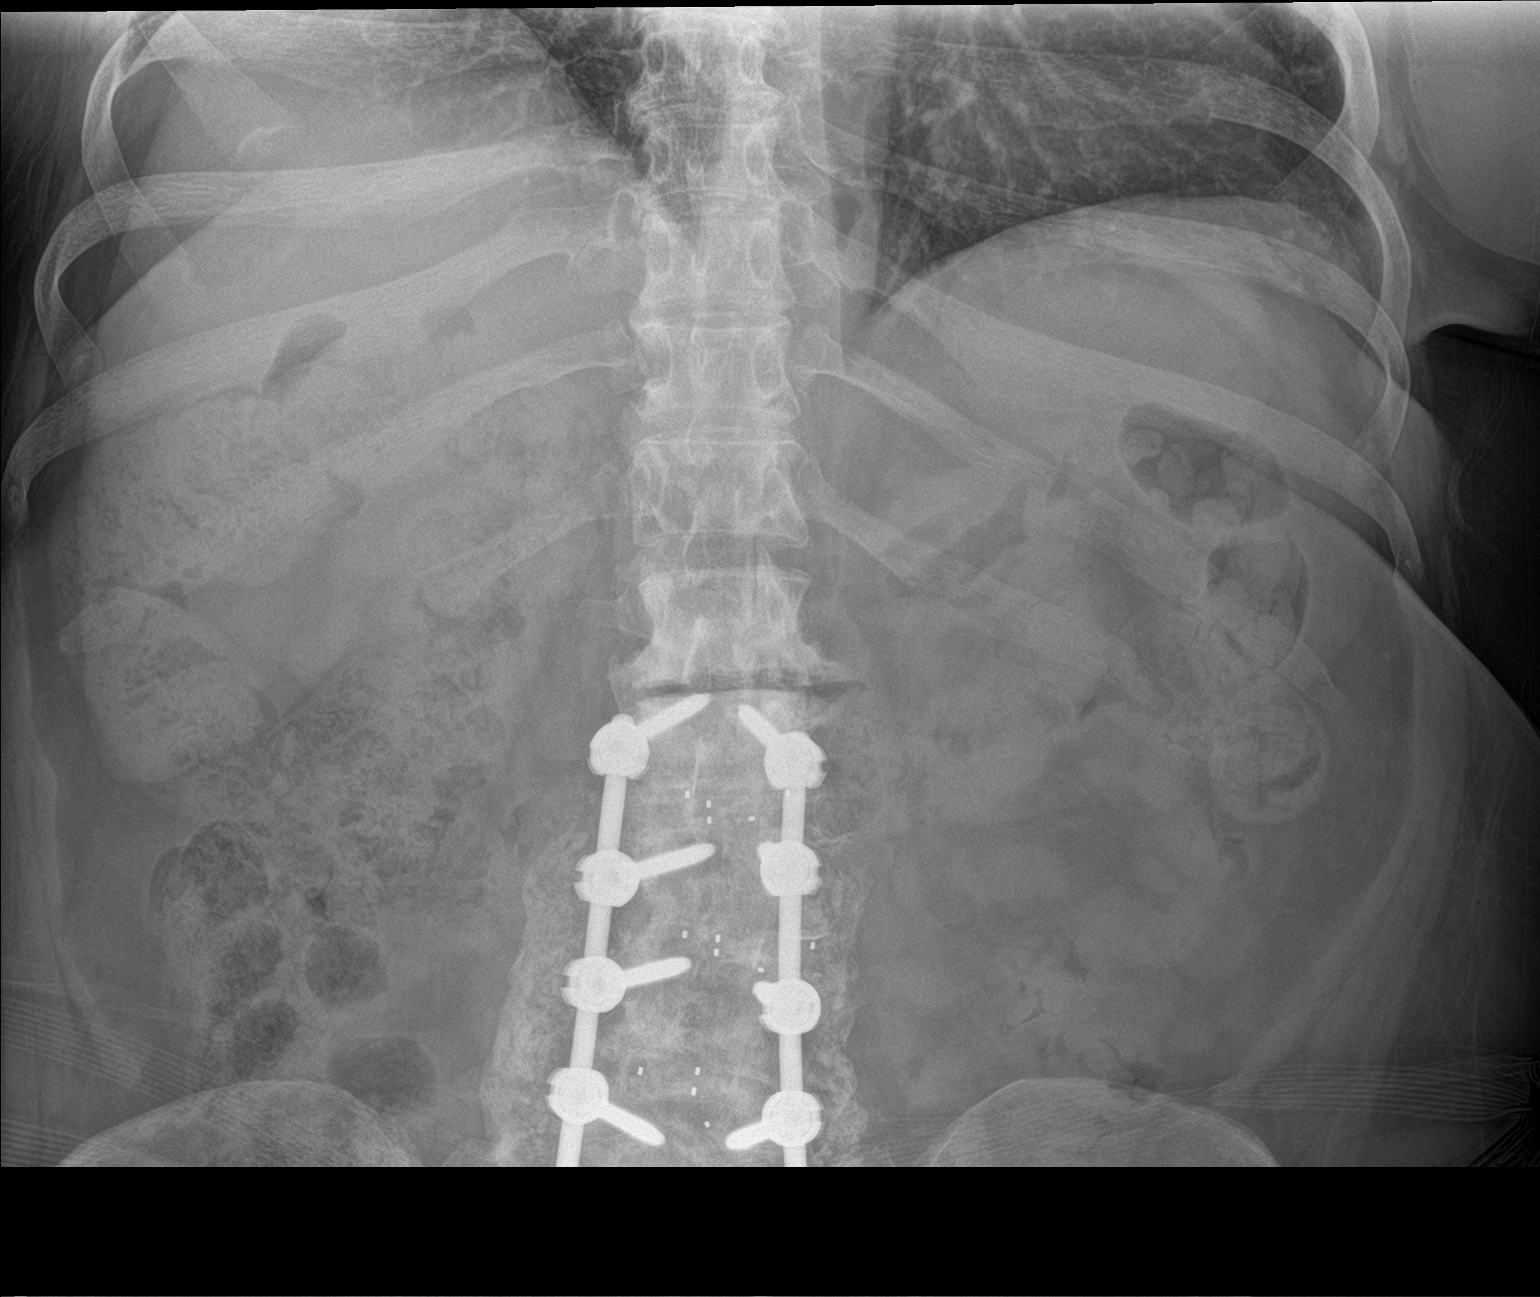

[2 of 2 positions shown; findings below may reference images not displayed]

FINDINGS: Large amount of stool noted throughout the colon and postoperative
changes of the lumbar spine make evaluation for stone disease
difficult. Previously identified right upper ureteral stone is not
definitely identified on today's exam. Follow-up CT can be obtained
as needed. No bowel distention.
IMPRESSION: Large amount of stool noted throughout the colon and postoperative
changes of the lumbar spine make evaluation for stone disease
difficult. Previously identified right upper ureteral stone is not
definitely identified on today's exam. To confirm stone passage CT
can be obtained as needed.

## 2019-05-15 MED ORDER — OXYBUTYNIN CHLORIDE ER 10 MG PO TB24: 10.0000 mg | ORAL_TABLET | Freq: Every day | ORAL | 2 refills | Status: DC

## 2019-05-15 NOTE — Progress Notes (Signed)
   05/15/2019 10:41 AM   Lisa Good 24-Oct-1950 [id number]  Reason for visit: Follow up shockwave lithotripsy  HPI: I saw Lisa Good back in urology clinic for follow-up of shockwave lithotripsy on 04/27/2019 for a right 4 mm proximal ureteral stone.  She reports her right-sided flank pain has completely resolved.  She does have fibromyalgia and has had some body aches elsewhere, but overall feels significantly improved since surgery.  I personally reviewed her KUB today and do not appreciate any residual fragments.  She also wanted to discuss urinary incontinence today.  She reportedly has a history of 2 reported urethral slings by an OB/GYN, as well as what sounds like an autologous fascial sling performed by a urologist in [place].  She reports what sounds like urge incontinence greater than 5 times per day.  She denies any stress urinary incontinence.  She has never tried any medications for this.  ROS: Please see flowsheet from today's date for complete review of systems.  Physical Exam: BP 105/69   Pulse (!) 116   Ht 5' 4"  (1.626 m)   Wt 203 lb (92.1 kg)   BMI 34.84 kg/m    Constitutional:  Alert and oriented, No acute distress. Respiratory: Normal respiratory effort, no increased work of breathing. GI: Abdomen is soft, nontender, nondistended, no abdominal masses GU: No CVA tenderness Skin: No rashes, bruises or suspicious lesions. Neurologic: Grossly intact, no focal deficits, moving all 4 extremities. Psychiatric: Normal mood and affect  Pertinent Imaging: Personally reviewed KUB today, no apparent residual stone fragments  Assessment & Plan:   In summary, she is a 68 year old female with fibromyalgia status post successful shockwave lithotripsy on 04/27/2019 for a right 4 mm ureteral stone.  She has no other renal stones.  We discussed general stone prevention strategies including adequate hydration with goal of producing 2.5 L of urine daily, increasing citric  acid intake, increasing calcium intake during high oxalate meals, minimizing animal protein, and decreasing salt intake. Information about dietary recommendations given today.   She also has a long history of what sounds like urge incontinence and has previously undergone 2 urethral slings, as well as an autologous fascial sling in [place].  With her complex history and multiple prior incontinence surgeries, I recommended a trial of Ditropan XL 10 mg daily, and follow-up with my partner Dr. Matilde Sprang for symptom check and consideration of other options.  -See Dr. Matilde Sprang in 2 months to discuss urge urinary incontinence after trial of ditropan XL 38m -RTC 1 year for KUB with me for stone surveillance  A total of 15 minutes were spent face-to-face with the patient, greater than 50% was spent in patient education, counseling, and coordination of care regarding KUB results, stone prevention, and urge incontinence.   [facility] [address] [place] [phone]

## 2019-05-15 NOTE — Patient Instructions (Signed)

## 2019-05-18 ENCOUNTER — Other Ambulatory Visit: Payer: Self-pay | Admitting: Urology

## 2019-05-22 ENCOUNTER — Encounter: Payer: Self-pay | Admitting: Student in an Organized Health Care Education/Training Program

## 2019-05-23 ENCOUNTER — Encounter: Payer: Self-pay | Admitting: Student in an Organized Health Care Education/Training Program

## 2019-05-23 LAB — DRUG SCREEN 10 W/CONF, SERUM
Amphetamines, IA: NEGATIVE ng/mL
Barbiturates, IA: NEGATIVE ug/mL
Benzodiazepines, IA: NEGATIVE ng/mL
Cocaine & Metabolite, IA: NEGATIVE ng/mL
Methadone, IA: NEGATIVE ng/mL
Opiates, IA: NEGATIVE ng/mL
Oxycodones, IA: NEGATIVE ng/mL
Phencyclidine, IA: NEGATIVE ng/mL
Propoxyphene, IA: NEGATIVE ng/mL
THC(Marijuana) Metabolite, IA: NEGATIVE ng/mL

## 2019-05-23 LAB — SPECIMEN STATUS REPORT

## 2019-05-30 LAB — BUPRENORPHINE AND METABOLITE
Buprenorphine: 2 ng/mL (ref 1–10)
Norbuprenorphine: 1 ng/mL

## 2019-06-01 ENCOUNTER — Telehealth: Payer: Self-pay | Admitting: Student in an Organized Health Care Education/Training Program

## 2019-06-01 NOTE — Telephone Encounter (Signed)
Attempted to call patient back.  Left message on machine to call us back.

## 2019-06-01 NOTE — Telephone Encounter (Signed)
Patient is having excruciating pain and thought her appt was yesterday. It is scheduled for 06-07-19. She says she cannot wait until then and needs to talk to Dr. Holley Raring today to get some relief. Please talk to Dr. Holley Raring and see what he can offer this patient.

## 2019-06-05 NOTE — Telephone Encounter (Signed)
LM forpatient to callus back.

## 2019-06-07 ENCOUNTER — Ambulatory Visit
Payer: Medicare Other | Attending: Student in an Organized Health Care Education/Training Program | Admitting: Student in an Organized Health Care Education/Training Program

## 2019-06-07 ENCOUNTER — Encounter: Payer: Self-pay | Admitting: Student in an Organized Health Care Education/Training Program

## 2019-06-07 ENCOUNTER — Other Ambulatory Visit: Payer: Self-pay

## 2019-06-07 DIAGNOSIS — M47812 Spondylosis without myelopathy or radiculopathy, cervical region: Secondary | ICD-10-CM | POA: Diagnosis not present

## 2019-06-07 DIAGNOSIS — Z981 Arthrodesis status: Secondary | ICD-10-CM

## 2019-06-07 DIAGNOSIS — M4802 Spinal stenosis, cervical region: Secondary | ICD-10-CM | POA: Diagnosis not present

## 2019-06-07 DIAGNOSIS — G894 Chronic pain syndrome: Secondary | ICD-10-CM | POA: Diagnosis not present

## 2019-06-07 DIAGNOSIS — M961 Postlaminectomy syndrome, not elsewhere classified: Secondary | ICD-10-CM

## 2019-06-07 DIAGNOSIS — M48062 Spinal stenosis, lumbar region with neurogenic claudication: Secondary | ICD-10-CM

## 2019-06-07 MED ORDER — OXYCODONE-ACETAMINOPHEN 10-325 MG PO TABS: 1.0000 | ORAL_TABLET | Freq: Four times a day (QID) | ORAL | 0 refills | Status: AC | PRN

## 2019-06-07 MED ORDER — CELECOXIB 200 MG PO CAPS: 200.0000 mg | ORAL_CAPSULE | Freq: Two times a day (BID) | ORAL | 2 refills | Status: DC | PRN

## 2019-06-07 MED ORDER — OXYCODONE-ACETAMINOPHEN 10-325 MG PO TABS: 1.0000 | ORAL_TABLET | Freq: Four times a day (QID) | ORAL | 0 refills | Status: DC | PRN

## 2019-06-07 NOTE — Progress Notes (Signed)
Pain Management Virtual Encounter Note - Virtual Visit via [place] (real-time audio visits between healthcare provider and Lisa Good).   Lisa Good's Phone No. & Preferred Pharmacy:  [phone] (home); [phone] (mobile); (Preferred) [phone] [email or web address]  [name]Phone: [phone]: [phone]   Pre-screening note:  Our staff contacted Ms. Lisa Mangoand offered her an "in person", "face-to-face" appointment versus a telephone encounter. She indicated preferring the telephone encounter, at this time.   Reason for Virtual Visit: COVID-19*  Social distancing based on CDC and AMA recommendations.   I contacted Lisa Good on 06/07/2019 via video conference.      I clearly identified myself as BGillis Santa MD. I verified that I was speaking with the correct person using two identifiers (Name: Lisa Good and date of birth: 1Jul 24, 1952.  Advanced Informed Consent I sought verbal advanced consent from [facility]. I informed [name] of possible security and privacy concerns, risks, and limitations associated with providing "not-in-person" medical evaluation and management services. I also informed Lisa Good of the availability of "in-person" appointments. Finally, I informed her that there would be a charge for the virtual visit and that she could be  personally, fully or partially, financially responsible for it. Lisa Good understanding and agreed to proceed.   Historic Elements   Ms. SRuie Sendejois a 68y.o. year old, female Lisa Good evaluated today after her last encounter by our practice on 06/01/2019. Lisa Good has a past medical history of Allergy, Arthritis, Depression, GERD (gastroesophageal reflux disease), Hyperlipidemia, Hypertension, Neuromuscular disorder (HCole, and Sleep apnea. She also  has a past surgical  history that includes Spine surgery; Joint replacement (Right); Tubal ligation; Brain surgery (2002); and Extracorporeal shock wave lithotripsy (Right, 04/27/2019). [name] a current medication list which includes the following prescription(s): alpha-lipoic acid, atorvastatin, fluticasone, gabapentin, lisinopril, oxybutynin, venlafaxine xr, amlodipine, celecoxib, oxycodone-acetaminophen, and oxycodone-acetaminophen. She  reports that she has never smoked. She has never used smokeless tobacco. She reports that she does not drink alcohol or use drugs. Lisa Good allergic to contrast media [iodinated diagnostic agents] and cephalexin.   HPI  Today, she is being contacted for medication management.   Lisa Good states that she is in excruciating pain.  She finds it difficult to move out of her sofa.  She is having significant pain in her neck region as well as her lower lumbar spine.  She has an extensive history of a lumbar spine fusion from L2-S1.  She is scheduled for a C4-C5 corpectomy with C6-C7 ACDF on 07/24/2019, and C6-T2 PSF  I had extensive discussion with the Lisa Good regarding med management.  She is on Belbuca 600 mcg twice daily.  The recommendation is to discontinue belbuca and transition Lisa Good to a short acting opioid agonist prior to complex spine surgery which can be very stimulating and painful.  I have instructed the Lisa Good to discontinue her belbuca and we will transition her to Percocet 10 mg every 6 hours PRN, quantity 120/month.  Lisa Good became very tearful and somewhat anxious with the treatment plan.  She states that she was previously obtaining 10 mg every 4 hours as needed.  I educated her on the importance of weaning her opioid analgesics prior to an extensive cervical spine surgery so that these medications can be used to help manage her acute postoperative pain.  Lisa Good's Percocet at 10 mg every 4 hours which she  was on previously resulted in an MME of 90.  The dose that  I am proposing for her which is 10 mg every 6 hours is an MME of 60.  I did offer the Lisa Good an appointment for tomorrow so that she can come in and obtain an intramuscular Norflex and Toradol shot to help decrease her pain.  Pharmacotherapy Assessment  Analgesic: Currently on belbuca 600 mcg twice daily.  Instructed Lisa Good to discontinue and will transition to short acting opioid agonist, Percocet 10 mg every 6 hours PRN, quantity 120/month.  MME equals 60. Monitoring: Pharmacotherapy: No side-effects or adverse reactions reported.  PMP: PDMP reviewed during this encounter.       Compliance: No problems identified. Effectiveness: Clinically acceptable. Plan: Refer to "POC".  UDS:  Status:  Final result Visible to Lisa Good:  No (not released) Next appt:  06/08/2019 at 01:15 PM in Pain Medicine Lisa Santa, MD)  Ref Range & Units 3wk ago  Buprenorphine 1 - 10 ng/mL 2   Comment: This test was developed and its performance characteristics  determined by [facility]. It has not been cleared or approved  by the Food and Drug Administration.   Norbuprenorphine Not Estab. ng/mL 1   Comment: This test was developed and its performance characteristics  determined by [facility]. It has not been cleared or approved  by the Food and Drug Administration.   Resulting Agency  [facility]    Narrative Performed by: Maryan Puls Performed at: [address]  [address], [place], [place] [id number]  Lab Director: Rush Farmer MD, Phone: [phone]    Specimen Collected: 05/16/19 00:00 Last Resulted: 05/30/19 07:37          Imaging  Last 90 days:  Abdomen 1 View (kub)  Result Date: 05/15/2019 CLINICAL DATA:  Right ureteral stone. EXAM: ABDOMEN - 1 VIEW COMPARISON:  04/27/2019. FINDINGS: Large amount of stool noted throughout the colon and postoperative changes of the lumbar spine make evaluation for stone disease difficult. Previously identified right upper ureteral stone is not  definitely identified on today's exam. Follow-up CT can be obtained as needed. No bowel distention. IMPRESSION: Large amount of stool noted throughout the colon and postoperative changes of the lumbar spine make evaluation for stone disease difficult. Previously identified right upper ureteral stone is not definitely identified on today's exam. To confirm stone passage CT can be obtained as needed. Electronically Signed   By: Marcello Moores  Register   On: 05/15/2019 14:59   Dg Abd 1 View  Result Date: 04/27/2019 CLINICAL DATA:  Right ureteral stone. EXAM: ABDOMEN - 1 VIEW DECUBITUS; ABDOMEN - 1 VIEW COMPARISON:  Abdominal x-ray dated April 20, 2019. CT abdomen pelvis dated April 18, 2019. FINDINGS: Unchanged 4 mm calculus in the proximal right ureter. No additional calculi. Unchanged small calcification in the left pelvis, corresponding to a dystrophic calcification in the posterior gluteal soft tissues seen on prior CT. The bowel gas pattern is normal. No acute osseous abnormality. Prior L2-S1 PLIF with adjacent segment disease at L1-L2. IMPRESSION: 1. Unchanged 4 mm calculus in the proximal right ureter. Electronically Signed   By: Titus Dubin M.D.   On: 04/27/2019 08:32   Abdomen 1 View (kub)  Result Date: 04/20/2019 CLINICAL DATA:  Persistent RIGHT flank pain. Proximal RIGHT ureteral calculus identified on recent CT. EXAM: ABDOMEN - 1 VIEW COMPARISON:  CT abdomen and pelvis 04/18/2019. FINDINGS: The 4 mm calculus in the proximal RIGHT ureter identified on the CT 2 days ago is unchanged in location.  No visible opaque urinary tract calculi elsewhere. Moderate colonic stool burden. Normal bowel gas pattern. Prior L2 through S1 fusion. Severe degenerative disc disease at L1-2. IMPRESSION: 4 mm proximal RIGHT ureteral calculus, unchanged in location since the CT 2 days ago. Electronically Signed   By: Evangeline Dakin M.D.   On: 04/20/2019 10:12   Dg Abd Decub  Result Date: 04/27/2019 CLINICAL DATA:  Right  ureteral stone. EXAM: ABDOMEN - 1 VIEW DECUBITUS; ABDOMEN - 1 VIEW COMPARISON:  Abdominal x-ray dated April 20, 2019. CT abdomen pelvis dated April 18, 2019. FINDINGS: Unchanged 4 mm calculus in the proximal right ureter. No additional calculi. Unchanged small calcification in the left pelvis, corresponding to a dystrophic calcification in the posterior gluteal soft tissues seen on prior CT. The bowel gas pattern is normal. No acute osseous abnormality. Prior L2-S1 PLIF with adjacent segment disease at L1-L2. IMPRESSION: 1. Unchanged 4 mm calculus in the proximal right ureter. Electronically Signed   By: Titus Dubin M.D.   On: 04/27/2019 08:32   [facility] C-arm 1-60 Min No Report  Result Date: 04/03/2019 Fluoro was used, but no Radiologist interpretation will be provided. Please refer to "NOTES" tab for provider progress note.  Ct Renal Stone Study  Result Date: 04/18/2019 CLINICAL DATA:  Microscopic hematuria. EXAM: CT ABDOMEN AND PELVIS WITHOUT CONTRAST TECHNIQUE: Multidetector CT imaging of the abdomen and pelvis was performed following the standard protocol without IV contrast. COMPARISON:  None. FINDINGS: Lower chest: No acute abnormality. Hepatobiliary: No focal liver lesion is identified. There is a gallstone in the gallbladder. The biliary tree is normal. Pancreas: Unremarkable. No pancreatic ductal dilatation or surrounding inflammatory changes. Spleen: Normal in size without focal abnormality. Adrenals/Urinary Tract: The bilateral adrenal glands are normal. There is right hydronephrosis due to obstruction by a 4 mm stone in the proximal right ureter. There is no left hydronephrosis. No focal renal lesion is noted. The bladder is normal. Stomach/Bowel: Stomach is within normal limits. Appendix appears normal. No evidence of bowel wall thickening, distention, or inflammatory changes. Extensive bowel content is identified in the colon. Vascular/Lymphatic: Aortic atherosclerosis. No enlarged  abdominal or pelvic lymph nodes. Reproductive: Uterus and bilateral adnexa are unremarkable. Other: There are small lymph nodes in the bilateral inguinal region largest measuring 8 mm in short axis, nonspecific. Musculoskeletal: Status post prior posterior fusion of the lumbar spine. IMPRESSION: Right hydronephrosis due to obstruction by 4 mm stone in the proximal right ureter. Electronically Signed   By: Abelardo Diesel M.D.   On: 04/18/2019 14:15    Assessment  The primary encounter diagnosis was Chronic pain syndrome. Diagnoses of Cervical spondylosis without myelopathy, History of fusion of lumbar spine, Foraminal stenosis of cervical region, Cervical stenosis of spinal canal, Postlaminectomy syndrome of lumbosacral region, and Spinal stenosis of lumbar region with neurogenic claudication were also pertinent to this visit.  Plan of Care  I have discontinued Lisa Good's Belbuca. I have also changed her celecoxib. Additionally, I am having her start on oxyCODONE-acetaminophen and oxyCODONE-acetaminophen. Lastly, I am having her maintain [name], amLODipine, atorvastatin, fluticasone, gabapentin, lisinopril, venlafaxine XR, and oxybutynin.  She is scheduled for a C4-C5 corpectomy with C6-C7 ACDF on 07/24/2019, and C6-T2 PSF  I had extensive discussion with the Lisa Good regarding med management.  She is on Belbuca 600 mcg twice daily.  The recommendation is to discontinue belbuca and transition Lisa Good to a short acting opioid agonist prior to complex spine surgery which can be very stimulating and painful.  I have instructed the Lisa Good to discontinue her belbuca and we will transition her to Percocet 10 mg every 6 hours PRN, quantity 120/month.  Lisa Good became very tearful and somewhat anxious with the treatment plan.  She states that she was previously obtaining 10 mg every 4 hours as needed.  I educated her on the importance of weaning her opioid analgesics prior to an extensive  cervical spine surgery so that these medications can be used to help manage her acute postoperative pain.  This will help reduce her opioid tolerance so that these medications can be more effective in the postoperative period.  Lisa Good's Percocet at 10 mg every 4 hours which she was on previously resulted in an MME of 90.  The dose that I am proposing for her which is 10 mg every 6 hours is an MME of 60.  I did offer the Lisa Good an appointment for tomorrow so that she can come in and obtain an intramuscular Norflex and Toradol shot to help decrease her pain.  Pharmacotherapy (Medications Ordered): Meds ordered this encounter  Medications  . celecoxib (CELEBREX) 200 MG capsule    Sig: Take 1 capsule (200 mg total) by mouth 2 (two) times daily as needed.    Dispense:  60 capsule    Refill:  2  . oxyCODONE-acetaminophen (PERCOCET) 10-325 MG tablet    Sig: Take 1 tablet by mouth every 6 (six) hours as needed for pain. Must last 30 days.    Dispense:  120 tablet    Refill:  0    Chronic Pain. (STOP Act - Not applicable). Fill one day early if closed on scheduled refill date.  Marland Kitchen oxyCODONE-acetaminophen (PERCOCET) 10-325 MG tablet    Sig: Take 1 tablet by mouth every 6 (six) hours as needed for pain. Must last 30 days.    Dispense:  120 tablet    Refill:  0    Chronic Pain. (STOP Act - Not applicable). Fill one day early if closed on scheduled refill date.  -Continue multimodal analgesics with alpha lipoic acid 300 mg daily, Celebrex 200 mg twice daily as needed, gabapentin 800 mg 4 times daily   Follow-up plan:   Return in about 8 weeks (around 08/02/2019) for Medication Management, virtual.    Recent Visits Date Type Provider Dept  05/01/19 Office Visit Lisa Santa, MD [facility]  04/11/19 Office Visit Lisa Santa, MD [facility]  03/13/19 Procedure visit Lisa Santa, MD [facility]  Showing recent visits within past 90 days and meeting all other  requirements   Today's Visits Date Type Provider Dept  06/07/19 Office Visit Lisa Santa, MD [facility]  Showing today's visits and meeting all other requirements   Future Appointments Date Type Provider Dept  06/08/19 Appointment Lisa Santa, MD [facility]  07/11/19 Appointment Lisa Santa, MD [facility]  Showing future appointments within next 90 days and meeting all other requirements   I discussed the assessment and treatment plan with the Lisa Good. The Lisa Good was provided an opportunity to ask questions and all were answered. The Lisa Good agreed with the plan and demonstrated an understanding of the instructions.  Lisa Good advised to call back or seek an in-person evaluation if the symptoms or condition worsens.  Total duration of non-face-to-face encounter: 47mnutes.  Note by: BGillis Santa MD Date: 06/07/2019; Time: 3:49 PM  Note: This dictation was prepared with Dragon dictation. Any transcriptional errors that may result from this process are unintentional.  Disclaimer:  *  Given the special circumstances of the COVID-19 pandemic, the federal government has announced that the Office for Civil Rights (OCR) will exercise its enforcement discretion and will not impose penalties on physicians using telehealth in the event of noncompliance with regulatory requirements under the [place] and [place] (HIPAA) in connection with the Good faith provision of telehealth during the URKYH-06 national public health emergency. ([place])

## 2019-06-08 ENCOUNTER — Ambulatory Visit
Payer: Medicare Other | Attending: Student in an Organized Health Care Education/Training Program | Admitting: Student in an Organized Health Care Education/Training Program

## 2019-06-08 ENCOUNTER — Other Ambulatory Visit: Payer: Self-pay

## 2019-06-08 ENCOUNTER — Encounter: Payer: Self-pay | Admitting: Student in an Organized Health Care Education/Training Program

## 2019-06-08 VITALS — BP 136/82 | HR 86 | Temp 98.3°F

## 2019-06-08 DIAGNOSIS — Z981 Arthrodesis status: Secondary | ICD-10-CM | POA: Insufficient documentation

## 2019-06-08 DIAGNOSIS — M47812 Spondylosis without myelopathy or radiculopathy, cervical region: Secondary | ICD-10-CM | POA: Diagnosis not present

## 2019-06-08 DIAGNOSIS — G894 Chronic pain syndrome: Secondary | ICD-10-CM | POA: Insufficient documentation

## 2019-06-08 MED ORDER — ORPHENADRINE CITRATE 30 MG/ML IJ SOLN
30.0000 mg | Freq: Once | INTRAMUSCULAR | Status: AC
  Administered 2019-06-08: 30 mg via INTRAMUSCULAR
  Filled 2019-06-08: qty 2

## 2019-06-08 MED ORDER — KETOROLAC TROMETHAMINE 30 MG/ML IJ SOLN
30.0000 mg | Freq: Once | INTRAMUSCULAR | Status: AC
  Administered 2019-06-08: 14:00:00 30 mg via INTRAMUSCULAR
  Filled 2019-06-08: qty 1

## 2019-06-08 NOTE — Progress Notes (Signed)
Patient's Name: Lisa Good  MRN: [id number]  Referring Provider: [name], MD  DOB: 02/07/1951  PCP: [name], MD  DOS: 06/08/2019  Note by: Gillis Santa, MD  Service setting: Ambulatory outpatient  Attending: Gillis Santa, MD  Location: [facility] (AMB) Pain Management Facility  Specialty: Interventional Pain Management  Patient type: Established   Primary Reason(s) for Visit: Evaluation of chronic illnesses with exacerbation, or progression (Level of risk: moderate) CC: No chief complaint on file.  HPI  Lisa Good is a 68 y.o. year old, female patient, who comes today for a follow-up evaluation. She has Cervical spondylosis without myelopathy; Foraminal stenosis of cervical region; Cervical stenosis of spinal canal; History of fusion of lumbar spine; Spinal stenosis of lumbar region with neurogenic claudication; Postlaminectomy syndrome of lumbosacral region; and Chronic pain syndrome on their problem list. Lisa Good was last seen on 06/07/2019. Her primarily concern today is the No chief complaint on file.  Pain Assessment: Location: Left(leg) Back Radiating: down left leg Onset: More than a month ago Duration: Chronic pain Quality: Sharp, Burning, Throbbing Severity: 5 /10 (subjective, self-reported pain score)  Note: Reported level is compatible with observation.                         When using our objective Pain Scale, levels between 6 and 10/10 are said to belong in an emergency room, as it progressively worsens from a 6/10, described as severely limiting, requiring emergency care not usually available at an outpatient pain management facility. At a 6/10 level, communication becomes difficult and requires great effort. Assistance to reach the emergency department may be required. Facial flushing and profuse sweating along with potentially dangerous increases in heart rate and blood pressure will be evident. Effect on ADL: limits my activities Timing: Constant Modifying  factors: medications BP: 136/82  HR: 86  Further details on both, my assessment(s), as well as the proposed treatment plan, please see below.  Laboratory Chemistry Profile   Screening Lab Results  Component Value Date   [place] NEGATIVE 04/24/2019   COVIDSOURCE NASOPHARYNGEAL 03/08/2019     Meds   Current Outpatient Medications:  .  Alpha-Lipoic Acid 100 MG TABS, Take 300 mg by mouth daily. , Disp: , Rfl:  .  amLODipine (NORVASC) 10 MG tablet, Take 10 mg by mouth daily., Disp: , Rfl:  .  atorvastatin (LIPITOR) 40 MG tablet, Take 40 mg by mouth daily., Disp: , Rfl:  .  celecoxib (CELEBREX) 200 MG capsule, Take 1 capsule (200 mg total) by mouth 2 (two) times daily as needed., Disp: 60 capsule, Rfl: 2 .  fluticasone (FLONASE) 50 MCG/ACT nasal spray, Place 2 sprays into both nostrils daily., Disp: , Rfl:  .  gabapentin (NEURONTIN) 800 MG tablet, Take 800 mg by mouth 4 (four) times daily., Disp: , Rfl:  .  lisinopril (PRINIVIL,ZESTRIL) 40 MG tablet, Take 40 mg by mouth daily., Disp: , Rfl:  .  oxybutynin (DITROPAN-XL) 10 MG 24 hr tablet, Take 1 tablet (10 mg total) by mouth daily., Disp: 30 tablet, Rfl: 2 .  oxyCODONE-acetaminophen (PERCOCET) 10-325 MG tablet, Take 1 tablet by mouth every 6 (six) hours as needed for pain. Must last 30 days., Disp: 120 tablet, Rfl: 0 .  [START ON 07/07/2019] oxyCODONE-acetaminophen (PERCOCET) 10-325 MG tablet, Take 1 tablet by mouth every 6 (six) hours as needed for pain. Must last 30 days., Disp: 120 tablet, Rfl: 0 .  venlafaxine XR (EFFEXOR-XR) 150 MG 24  hr capsule, Take 225 mg by mouth daily with breakfast., Disp: , Rfl:   ROS  Constitutional: Denies any fever or chills Gastrointestinal: No reported hemesis, hematochezia, vomiting, or acute GI distress Musculoskeletal: Denies any acute onset joint swelling, redness, loss of ROM, or weakness Neurological: No reported episodes of acute onset apraxia, aphasia, dysarthria, agnosia, amnesia, paralysis,  loss of coordination, or loss of consciousness  Allergies  Lisa Good is allergic to contrast media [iodinated diagnostic agents] and cephalexin.  [place]  Drug: Lisa Good  reports no history of drug use. Alcohol:  reports no history of alcohol use. Tobacco:  reports that she has never smoked. She has never used smokeless tobacco. Medical:  has a past medical history of Allergy, Arthritis, Depression, GERD (gastroesophageal reflux disease), Hyperlipidemia, Hypertension, Neuromuscular disorder (Benjamin Perez), and Sleep apnea. Surgical: Lisa Good  has a past surgical history that includes Spine surgery; Joint replacement (Right); Tubal ligation; Brain surgery (2002); and Extracorporeal shock wave lithotripsy (Right, 04/27/2019). Family: family history is not on file.  Constitutional Exam  General appearance: alert, cooperative and in moderate distress Vitals:   06/08/19 1355  BP: 136/82  Pulse: 86  Temp: 98.3 F (36.8 C)  SpO2: 98%   BMI Assessment: Estimated body mass index is 34.84 kg/m as calculated from the following:   Height as of 05/15/19: 5' 4"  (1.626 m).   Weight as of 05/15/19: 203 lb (92.1 kg).  BMI interpretation table: BMI level Category Range association with higher incidence of chronic pain  <18 kg/m2 Underweight   18.5-24.9 kg/m2 Ideal body weight   25-29.9 kg/m2 Overweight Increased incidence by 20%  30-34.9 kg/m2 Obese (Class I) Increased incidence by 68%  35-39.9 kg/m2 Severe obesity (Class II) Increased incidence by 136%  >40 kg/m2 Extreme obesity (Class III) Increased incidence by 254%   Patient's current BMI Ideal Body weight  There is no height or weight on file to calculate BMI. Patient weight not recorded   BMI Readings from Last 4 Encounters:  05/15/19 34.84 kg/m  04/27/19 33.99 kg/m  04/20/19 33.99 kg/m  03/13/19 33.99 kg/m   Wt Readings from Last 4 Encounters:  05/15/19 203 lb (92.1 kg)  04/27/19 198 lb (89.8 kg)  04/20/19 198 lb (89.8 kg)  03/13/19  198 lb (89.8 kg)  Psych/Mental status: Alert, oriented x 3 (person, place, & time)       Eyes: PERLA Respiratory: No evidence of acute respiratory distress  Cervical Spine Area Exam  Skin & Axial Inspection: Well healed scar from previous spine surgery detected Alignment: Asymmetric Functional ROM: Pain restricted ROM, bilaterally Stability: No instability detected Muscle Tone/Strength: Guarding detected Sensory (Neurological): Dermatomal pain pattern    Lumbar Spine Area Exam  Skin & Axial Inspection: Well healed scar from previous spine surgery detected Alignment: Asymmetric Functional ROM: Decreased ROM affecting both sides Stability: No instability detected Muscle Tone/Strength: Functionally intact. No obvious neuro-muscular anomalies detected. Sensory (Neurological): Dermatomal pain pattern   Gait & Posture Assessment  Ambulation: Limited Gait: Antalgic Posture: Difficulty standing up straight, due to pain   Lower Extremity Exam    Side: Right lower extremity  Side: Left lower extremity  Stability: No instability observed          Stability: No instability observed          Skin & Extremity Inspection: Skin color, temperature, and hair growth are WNL. No peripheral edema or cyanosis. No masses, redness, swelling, asymmetry, or associated skin lesions. No contractures.  Skin & Extremity Inspection: Skin  color, temperature, and hair growth are WNL. No peripheral edema or cyanosis. No masses, redness, swelling, asymmetry, or associated skin lesions. No contractures.  Functional ROM: Decreased ROM                  Functional ROM: Decreased ROM for all joints of the lower extremity          Muscle Tone/Strength: Functionally intact. No obvious neuro-muscular anomalies detected.  Muscle Tone/Strength: Functionally intact. No obvious neuro-muscular anomalies detected.  Sensory (Neurological): Unimpaired        Sensory (Neurological): Dermatomal pain pattern        DTR: Patellar:  0: absent Achilles: deferred today Plantar: deferred today  DTR: Patellar: 0: absent Achilles: deferred today Plantar: deferred today  Palpation: No palpable anomalies  Palpation: No palpable anomalies   Assessment   Status Diagnosis  Worsening Having a Flare-up Persistent 1. Chronic pain syndrome   2. Cervical spondylosis without myelopathy   3. History of fusion of lumbar spine      Plan of Care  Pharmacotherapy (Medications Ordered): Meds ordered this encounter  Medications  . orphenadrine (NORFLEX) injection 30 mg  . ketorolac (TORADOL) 30 MG/ML injection 30 mg   Recent Visits Date Type Provider Dept  06/07/19 Office Visit Gillis Santa, MD [facility]  05/01/19 Office Visit Gillis Santa, MD [facility]  04/11/19 Office Visit Gillis Santa, MD [facility]  03/13/19 Procedure visit Gillis Santa, MD [facility]  Showing recent visits within past 90 days and meeting all other requirements   Today's Visits Date Type Provider Dept  06/08/19 Office Visit Gillis Santa, MD [facility]  Showing today's visits and meeting all other requirements   Future Appointments Date Type Provider Dept  07/11/19 Appointment Gillis Santa, MD [facility]  Showing future appointments within next 90 days and meeting all other requirements   Primary Care Physician: [name], MD Location: [facility] Note by: Gillis Santa, MD Date: 06/08/2019; Time: 2:36 PM  Note: This dictation was prepared with Dragon dictation. Any transcriptional errors that may result from this process are unintentional.

## 2019-06-10 ENCOUNTER — Encounter: Payer: Self-pay | Admitting: Student in an Organized Health Care Education/Training Program

## 2019-06-13 ENCOUNTER — Other Ambulatory Visit: Payer: Self-pay | Admitting: Student in an Organized Health Care Education/Training Program

## 2019-06-13 MED ORDER — GABAPENTIN 800 MG PO TABS: 800.0000 mg | ORAL_TABLET | Freq: Four times a day (QID) | ORAL | 3 refills | Status: DC

## 2019-06-13 NOTE — Progress Notes (Signed)
Requested Prescriptions   Signed Prescriptions Disp Refills  . gabapentin (NEURONTIN) 800 MG tablet 120 tablet 3    Sig: Take 1 tablet (800 mg total) by mouth 4 (four) times daily.

## 2019-06-21 ENCOUNTER — Encounter: Payer: Self-pay | Admitting: Student in an Organized Health Care Education/Training Program

## 2019-06-28 DIAGNOSIS — G8929 Other chronic pain: Secondary | ICD-10-CM | POA: Insufficient documentation

## 2019-06-28 DIAGNOSIS — M4712 Other spondylosis with myelopathy, cervical region: Secondary | ICD-10-CM | POA: Insufficient documentation

## 2019-06-28 DIAGNOSIS — M545 Low back pain, unspecified: Secondary | ICD-10-CM | POA: Insufficient documentation

## 2019-06-29 ENCOUNTER — Telehealth: Payer: Self-pay | Admitting: Physician Assistant

## 2019-06-29 DIAGNOSIS — J029 Acute pharyngitis, unspecified: Secondary | ICD-10-CM

## 2019-06-29 NOTE — Progress Notes (Signed)
Based on what you shared with me, I feel your condition warrants further evaluation and I recommend that you be seen for a face to face visit.    I am concerned that your sore throat has returned/persisted despite taking antibiotics. There may be something more serious going and I think that you would benefit from an in person visit.  Please contact your primary care physician practice to be seen. Many offices offer virtual options to be seen via video if you are not comfortable going in person to a medical facility at this time.  If you do not have a PCP,  offers a free physician referral service available at [phone]. Our trained staff has the experience, knowledge and resources to put you in touch with a physician who is right for you.   You also have the option of a video visit through [email or web address]  If you are having a true medical emergency please call 911.  NOTE: If you entered your credit card information for this eVisit, you will not be charged. You may see a "hold" on your card for the $35 but that hold will drop off and you will not have a charge processed.  Your e-visit answers were reviewed by a board certified advanced clinical practitioner to complete your personal care plan.  Thank you for using e-Visits.  Greater than 5 minutes, yet less than 10 minutes of time have been spend researching, coordinating, and implementing care for this patient today.

## 2019-07-05 ENCOUNTER — Encounter: Payer: Self-pay | Admitting: Student in an Organized Health Care Education/Training Program

## 2019-07-06 ENCOUNTER — Encounter: Payer: Self-pay | Admitting: Student in an Organized Health Care Education/Training Program

## 2019-07-06 NOTE — Progress Notes (Signed)
Call the patient on 10 1 at 10:45 AM.  This was a regard to the messages that she left.  I wanted to clarify to the patient that I am aware of her pain and that unfortunately she has a very painful condition for which she is having surgery for in the upcoming week.  It is in her best interest to try and minimize her opioid intake which I have repeatedly advised her on on many occasions but from a psychological standpoint, the pain is displaying pain catastrophizing behavior and has a hyperfocus on escalation of opioid therapy.  Her rationale was that she was obtaining oxycodone 10 mg every 4 hours as needed (prior to her previous surgery).  Her current dose of oxycodone that I prescribed her is 10 mg every 6 hours as needed.  My rationale for this is that this medication will likely used in the postoperative period to help manage her pain and to minimize her tolerance to a slight degree, decreasing her dosing interval was recommended.  Apparently the patient does not seem to understand this because she thought that utilizing oxycodone 10 mg to 4 hours prior to surgery would be suitable both preop and postoperatively.  I informed her that if we were to escalate her opioids preoperatively to 10 mg every 4 hours, it would increase her chance of having difficult to control postoperative pain.  The messages that the patient is leaving are concerning and I question her psychological wellbeing.  Patient does consent to safety.  I informed her that it would be best if she returned to her previous pain provider in [place] or sought a referral to another pain clinic as there seems to be a disconnect in the recommendations that I am providing her and what she thinks is best for her chronic pain syndrome.  I have gone out of my way to try and coordinate care for her in terms of weaning her belbuca, coordinating with neurosurgery NP and getting her on a direct short acting opioid analgesic prior to her surgery which is  what is recommended for patients that are on long-acting belbuca. Her messages on mychart make me question her pain coping skills and I recommend she supplement her chronic pain care by seeing a pain psychologist to help with pain coping and pain catastrophizing which is negatively impacting her pain experience.

## 2019-07-10 HISTORY — PX: SPINAL FUSION: SHX223

## 2019-07-11 ENCOUNTER — Encounter: Payer: Medicare Other | Admitting: Student in an Organized Health Care Education/Training Program

## 2019-07-14 DIAGNOSIS — M40202 Unspecified kyphosis, cervical region: Secondary | ICD-10-CM | POA: Insufficient documentation

## 2019-07-14 MED ORDER — TAMSULOSIN HCL 0.4 MG PO CAPS
0.40 | ORAL_CAPSULE | ORAL | Status: DC
Start: 2019-07-15 — End: 2019-07-14

## 2019-07-14 MED ORDER — OXYCODONE HCL 5 MG PO TABS
10.00 | ORAL_TABLET | ORAL | Status: DC
Start: 2019-07-14 — End: 2019-07-14

## 2019-07-14 MED ORDER — METHOCARBAMOL 500 MG PO TABS
1000.00 | ORAL_TABLET | ORAL | Status: DC
Start: 2019-07-14 — End: 2019-07-14

## 2019-07-14 MED ORDER — ATORVASTATIN CALCIUM 40 MG PO TABS
40.00 | ORAL_TABLET | ORAL | Status: DC
Start: 2019-07-14 — End: 2019-07-14

## 2019-07-14 MED ORDER — CELECOXIB 200 MG PO CAPS
200.00 | ORAL_CAPSULE | ORAL | Status: DC
Start: 2019-07-14 — End: 2019-07-14

## 2019-07-14 MED ORDER — OXYBUTYNIN CHLORIDE ER 10 MG PO TB24
10.00 | ORAL_TABLET | ORAL | Status: DC
Start: 2019-07-14 — End: 2019-07-14

## 2019-07-14 MED ORDER — AMLODIPINE BESYLATE 10 MG PO TABS
10.00 | ORAL_TABLET | ORAL | Status: DC
Start: 2019-07-15 — End: 2019-07-14

## 2019-07-14 MED ORDER — ACETAMINOPHEN 325 MG PO TABS
975.00 | ORAL_TABLET | ORAL | Status: DC
Start: 2019-07-14 — End: 2019-07-14

## 2019-07-14 MED ORDER — GENERIC EXTERNAL MEDICATION
Status: DC
Start: ? — End: 2019-07-14

## 2019-07-14 MED ORDER — GABAPENTIN 400 MG PO CAPS
800.00 | ORAL_CAPSULE | ORAL | Status: DC
Start: 2019-07-14 — End: 2019-07-14

## 2019-07-14 MED ORDER — DEXAMETHASONE SODIUM PHOSPHATE 4 MG/ML IJ SOLN
4.00 | INTRAMUSCULAR | Status: DC
Start: 2019-07-14 — End: 2019-07-14

## 2019-07-14 MED ORDER — FLUTICASONE PROPIONATE 50 MCG/ACT NA SUSP
1.00 | NASAL | Status: DC
Start: ? — End: 2019-07-14

## 2019-07-14 MED ORDER — ENOXAPARIN SODIUM 40 MG/0.4ML ~~LOC~~ SOLN
40.00 | SUBCUTANEOUS | Status: DC
Start: 2019-07-15 — End: 2019-07-14

## 2019-07-14 MED ORDER — GENERIC EXTERNAL MEDICATION
225.00 | Status: DC
Start: 2019-07-14 — End: 2019-07-14

## 2019-07-14 MED ORDER — LIDOCAINE 5 % EX PTCH
2.00 | MEDICATED_PATCH | CUTANEOUS | Status: DC
Start: 2019-07-15 — End: 2019-07-14

## 2019-07-14 MED ORDER — PANTOPRAZOLE SODIUM 40 MG PO TBEC
40.00 | DELAYED_RELEASE_TABLET | ORAL | Status: DC
Start: 2019-07-15 — End: 2019-07-14

## 2019-07-14 MED ORDER — OXYCODONE HCL 5 MG PO TABS
5.00 | ORAL_TABLET | ORAL | Status: DC
Start: ? — End: 2019-07-14

## 2019-07-14 MED ORDER — BISACODYL 10 MG RE SUPP
10.00 | RECTAL | Status: DC
Start: ? — End: 2019-07-14

## 2019-07-14 MED ORDER — SENNOSIDES-DOCUSATE SODIUM 8.6-50 MG PO TABS
2.00 | ORAL_TABLET | ORAL | Status: DC
Start: 2019-07-14 — End: 2019-07-14

## 2019-07-14 MED ORDER — POLYETHYLENE GLYCOL 3350 17 G PO PACK
17.00 | PACK | ORAL | Status: DC
Start: 2019-07-14 — End: 2019-07-14

## 2019-07-14 MED ORDER — LISINOPRIL 20 MG PO TABS
40.00 | ORAL_TABLET | ORAL | Status: DC
Start: 2019-07-14 — End: 2019-07-14

## 2019-07-14 MED ORDER — LIDOCAINE HCL (PF) 1 % IJ SOLN
0.50 | INTRAMUSCULAR | Status: DC
Start: ? — End: 2019-07-14

## 2019-07-17 ENCOUNTER — Ambulatory Visit: Payer: Medicare Other | Admitting: Urology

## 2019-07-20 ENCOUNTER — Emergency Department: Payer: Medicare Other

## 2019-07-20 ENCOUNTER — Other Ambulatory Visit: Payer: Self-pay

## 2019-07-20 ENCOUNTER — Inpatient Hospital Stay
Admission: EM | Admit: 2019-07-20 | Discharge: 2019-07-22 | DRG: 871 | Disposition: A | Discharge status: Home / Self Care | Payer: Medicare Other | Attending: Internal Medicine | Admitting: Internal Medicine

## 2019-07-20 ENCOUNTER — Encounter: Payer: Self-pay | Admitting: Emergency Medicine

## 2019-07-20 DIAGNOSIS — I10 Essential (primary) hypertension: Secondary | ICD-10-CM

## 2019-07-20 DIAGNOSIS — E871 Hypo-osmolality and hyponatremia: Secondary | ICD-10-CM | POA: Diagnosis present

## 2019-07-20 DIAGNOSIS — Z20828 Contact with and (suspected) exposure to other viral communicable diseases: Secondary | ICD-10-CM | POA: Diagnosis present

## 2019-07-20 DIAGNOSIS — R339 Retention of urine, unspecified: Secondary | ICD-10-CM | POA: Diagnosis not present

## 2019-07-20 DIAGNOSIS — K5100 Ulcerative (chronic) pancolitis without complications: Secondary | ICD-10-CM | POA: Diagnosis present

## 2019-07-20 DIAGNOSIS — R109 Unspecified abdominal pain: Secondary | ICD-10-CM | POA: Diagnosis not present

## 2019-07-20 DIAGNOSIS — E875 Hyperkalemia: Secondary | ICD-10-CM | POA: Diagnosis present

## 2019-07-20 DIAGNOSIS — Z79891 Long term (current) use of opiate analgesic: Secondary | ICD-10-CM | POA: Diagnosis not present

## 2019-07-20 DIAGNOSIS — Z79899 Other long term (current) drug therapy: Secondary | ICD-10-CM | POA: Diagnosis not present

## 2019-07-20 DIAGNOSIS — Z888 Allergy status to other drugs, medicaments and biological substances status: Secondary | ICD-10-CM | POA: Diagnosis not present

## 2019-07-20 DIAGNOSIS — Z981 Arthrodesis status: Secondary | ICD-10-CM

## 2019-07-20 DIAGNOSIS — A419 Sepsis, unspecified organism: Secondary | ICD-10-CM | POA: Diagnosis present

## 2019-07-20 DIAGNOSIS — G894 Chronic pain syndrome: Secondary | ICD-10-CM | POA: Diagnosis present

## 2019-07-20 DIAGNOSIS — E1110 Type 2 diabetes mellitus with ketoacidosis without coma: Secondary | ICD-10-CM | POA: Diagnosis present

## 2019-07-20 DIAGNOSIS — D72829 Elevated white blood cell count, unspecified: Secondary | ICD-10-CM | POA: Diagnosis not present

## 2019-07-20 DIAGNOSIS — Z96651 Presence of right artificial knee joint: Secondary | ICD-10-CM | POA: Diagnosis present

## 2019-07-20 DIAGNOSIS — M1990 Unspecified osteoarthritis, unspecified site: Secondary | ICD-10-CM | POA: Diagnosis present

## 2019-07-20 DIAGNOSIS — F329 Major depressive disorder, single episode, unspecified: Secondary | ICD-10-CM | POA: Diagnosis present

## 2019-07-20 DIAGNOSIS — N179 Acute kidney failure, unspecified: Secondary | ICD-10-CM | POA: Diagnosis not present

## 2019-07-20 DIAGNOSIS — Z91041 Radiographic dye allergy status: Secondary | ICD-10-CM

## 2019-07-20 DIAGNOSIS — R197 Diarrhea, unspecified: Secondary | ICD-10-CM | POA: Diagnosis not present

## 2019-07-20 DIAGNOSIS — E785 Hyperlipidemia, unspecified: Secondary | ICD-10-CM | POA: Diagnosis present

## 2019-07-20 DIAGNOSIS — N3941 Urge incontinence: Secondary | ICD-10-CM | POA: Diagnosis not present

## 2019-07-20 DIAGNOSIS — E860 Dehydration: Secondary | ICD-10-CM | POA: Diagnosis present

## 2019-07-20 DIAGNOSIS — E872 Acidosis, unspecified: Secondary | ICD-10-CM

## 2019-07-20 DIAGNOSIS — K219 Gastro-esophageal reflux disease without esophagitis: Secondary | ICD-10-CM | POA: Diagnosis present

## 2019-07-20 DIAGNOSIS — D509 Iron deficiency anemia, unspecified: Secondary | ICD-10-CM | POA: Diagnosis present

## 2019-07-20 DIAGNOSIS — Z881 Allergy status to other antibiotic agents status: Secondary | ICD-10-CM

## 2019-07-20 DIAGNOSIS — A0472 Enterocolitis due to Clostridium difficile, not specified as recurrent: Secondary | ICD-10-CM | POA: Diagnosis present

## 2019-07-20 DIAGNOSIS — Z87442 Personal history of urinary calculi: Secondary | ICD-10-CM

## 2019-07-20 DIAGNOSIS — K529 Noninfective gastroenteritis and colitis, unspecified: Secondary | ICD-10-CM

## 2019-07-20 DIAGNOSIS — Z960 Presence of urogenital implants: Secondary | ICD-10-CM | POA: Diagnosis not present

## 2019-07-20 LAB — CBC WITH DIFFERENTIAL/PLATELET
Abs Immature Granulocytes: 0.22 10*3/uL — ABNORMAL HIGH (ref 0.00–0.07)
Basophils Absolute: 0.1 10*3/uL (ref 0.0–0.1)
Basophils Relative: 0 %
Eosinophils Absolute: 0 10*3/uL (ref 0.0–0.5)
Eosinophils Relative: 0 %
HCT: 30.2 % — ABNORMAL LOW (ref 36.0–46.0)
Hemoglobin: 9.6 g/dL — ABNORMAL LOW (ref 12.0–15.0)
Immature Granulocytes: 1 %
Lymphocytes Relative: 6 %
Lymphs Abs: 1.5 10*3/uL (ref 0.7–4.0)
MCH: 28.7 pg (ref 26.0–34.0)
MCHC: 31.8 g/dL (ref 30.0–36.0)
MCV: 90.1 fL (ref 80.0–100.0)
Monocytes Absolute: 1.3 10*3/uL — ABNORMAL HIGH (ref 0.1–1.0)
Monocytes Relative: 5 %
Neutro Abs: 23.4 10*3/uL — ABNORMAL HIGH (ref 1.7–7.7)
Neutrophils Relative %: 88 %
Platelets: 460 10*3/uL — ABNORMAL HIGH (ref 150–400)
RBC: 3.35 MIL/uL — ABNORMAL LOW (ref 3.87–5.11)
RDW: 14.3 % (ref 11.5–15.5)
Smear Review: NORMAL
WBC: 26.5 10*3/uL — ABNORMAL HIGH (ref 4.0–10.5)
nRBC: 0.1 % (ref 0.0–0.2)

## 2019-07-20 LAB — COMPREHENSIVE METABOLIC PANEL
ALT: 15 U/L (ref 0–44)
AST: 32 U/L (ref 15–41)
Albumin: 3.3 g/dL — ABNORMAL LOW (ref 3.5–5.0)
Alkaline Phosphatase: 100 U/L (ref 38–126)
Anion gap: 17 — ABNORMAL HIGH (ref 5–15)
BUN: 46 mg/dL — ABNORMAL HIGH (ref 8–23)
CO2: 21 mmol/L — ABNORMAL LOW (ref 22–32)
Calcium: 8.4 mg/dL — ABNORMAL LOW (ref 8.9–10.3)
Chloride: 95 mmol/L — ABNORMAL LOW (ref 98–111)
Creatinine, Ser: 2.1 mg/dL — ABNORMAL HIGH (ref 0.44–1.00)
GFR calc Af Amer: 27 mL/min — ABNORMAL LOW (ref >60)
GFR calc non Af Amer: 24 mL/min — ABNORMAL LOW (ref >60)
Glucose, Bld: 131 mg/dL — ABNORMAL HIGH (ref 70–99)
Potassium: 6 mmol/L — ABNORMAL HIGH (ref 3.5–5.1)
Sodium: 133 mmol/L — ABNORMAL LOW (ref 135–145)
Total Bilirubin: 0.5 mg/dL (ref 0.3–1.2)
Total Protein: 6.1 g/dL — ABNORMAL LOW (ref 6.5–8.1)

## 2019-07-20 LAB — URINALYSIS, COMPLETE (UACMP) WITH MICROSCOPIC
Bacteria, UA: NONE SEEN
Bilirubin Urine: NEGATIVE
Glucose, UA: NEGATIVE mg/dL
Hgb urine dipstick: NEGATIVE
Ketones, ur: NEGATIVE mg/dL
Nitrite: NEGATIVE
Protein, ur: NEGATIVE mg/dL
Specific Gravity, Urine: 1.02 (ref 1.005–1.030)
pH: 5 (ref 5.0–8.0)

## 2019-07-20 LAB — BASIC METABOLIC PANEL
Anion gap: 5 (ref 5–15)
BUN: 41 mg/dL — ABNORMAL HIGH (ref 8–23)
CO2: 24 mmol/L (ref 22–32)
Calcium: 7.5 mg/dL — ABNORMAL LOW (ref 8.9–10.3)
Chloride: 104 mmol/L (ref 98–111)
Creatinine, Ser: 1.82 mg/dL — ABNORMAL HIGH (ref 0.44–1.00)
GFR calc Af Amer: 33 mL/min — ABNORMAL LOW (ref >60)
GFR calc non Af Amer: 28 mL/min — ABNORMAL LOW (ref >60)
Glucose, Bld: 109 mg/dL — ABNORMAL HIGH (ref 70–99)
Potassium: 5.8 mmol/L — ABNORMAL HIGH (ref 3.5–5.1)
Sodium: 133 mmol/L — ABNORMAL LOW (ref 135–145)

## 2019-07-20 LAB — BLOOD GAS, VENOUS
Acid-base deficit: 0.8 mmol/L (ref 0.0–2.0)
Bicarbonate: 23.5 mmol/L (ref 20.0–28.0)
O2 Saturation: 30 %
Patient temperature: 37
pCO2, Ven: 37 mmHg — ABNORMAL LOW (ref 44.0–60.0)
pH, Ven: 7.41 (ref 7.250–7.430)
pO2, Ven: <31 mmHg — CL (ref 32.0–45.0)

## 2019-07-20 LAB — LACTIC ACID, PLASMA
Lactic Acid, Venous: 3.1 mmol/L (ref 0.5–1.9)
Lactic Acid, Venous: 1.8 mmol/L (ref 0.5–1.9)
Lactic Acid, Venous: 1.4 mmol/L (ref 0.5–1.9)

## 2019-07-20 LAB — HIV ANTIBODY (ROUTINE TESTING W REFLEX): HIV Screen 4th Generation wRfx: NONREACTIVE

## 2019-07-20 LAB — POTASSIUM: Potassium: 4.7 mmol/L (ref 3.5–5.1)

## 2019-07-20 LAB — SARS CORONAVIRUS 2 (TAT 6-24 HRS): SARS Coronavirus 2: NEGATIVE

## 2019-07-20 IMAGING — DX DG CHEST 1V PORT
1 series · 1 of 1 positions shown · non-contrast
Comparison: None.

CLINICAL DATA: Pt arrival via EMS from home due to abdominal pain.
Pt complaining of nausea, diarrhea, and abdominal pain that started
last night and worsened this morning. Pt had back/neck surgery on
[DATE]. Nonsmoker. Hx of HTN.

EXAM:
PORTABLE CHEST 1 VIEW

[chest ap]
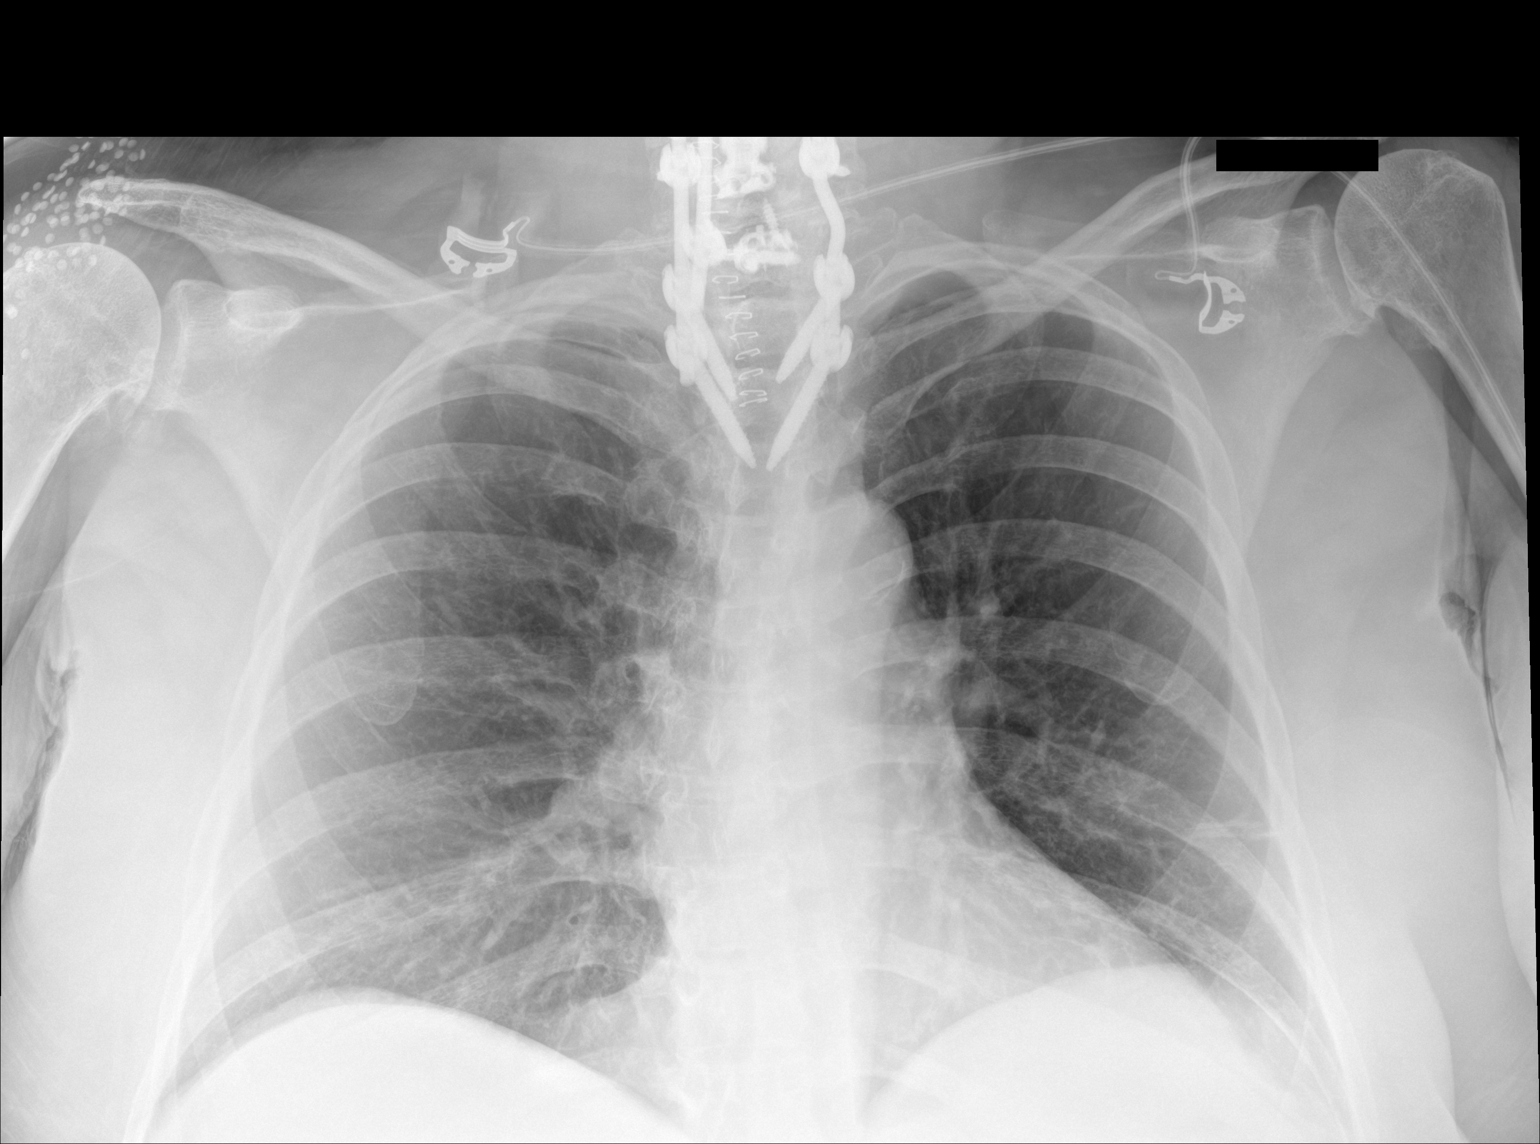

[1 of 1 positions shown; findings below may reference images not displayed]

FINDINGS: Heart size is normal. Shallow lung inflation. Minimal LEFT lung
atelectasis. No pulmonary edema. Previous cervicothoracic fusion.
IMPRESSION: 1. Shallow inflation.
2. No focal acute pulmonary abnormality.

## 2019-07-20 IMAGING — CT CT ABD-PELV W/O CM
2 of 4 series · 16 of 46 positions shown, 18 images · non-contrast
Comparison: [DATE]

CLINICAL DATA: Abdominal pain

EXAM:
CT ABDOMEN AND PELVIS WITHOUT CONTRAST
TECHNIQUE: Multidetector CT imaging of the abdomen and pelvis was performed
following the standard protocol without IV contrast.

[Series 2: routine abd/(person_name) (person_name) · axial · 0.78mm/px · z∈[-553,-113]mm · 13 of 96 slices shown, 15 images]
[im 4/96  soft-tissue]
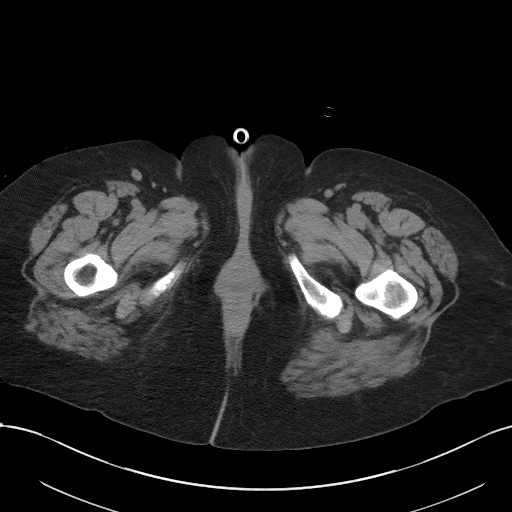
[im 4/96  bone]
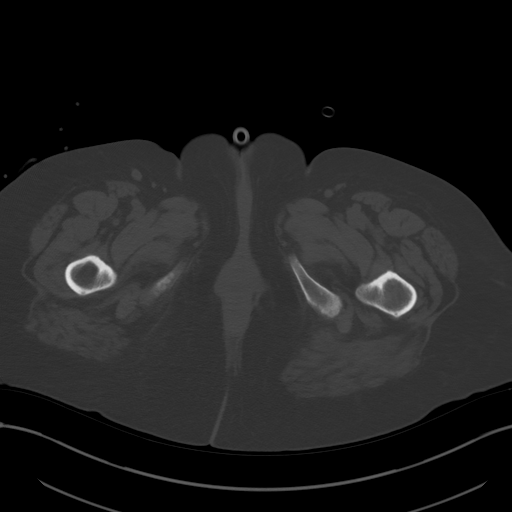
[im 12/96  soft-tissue]
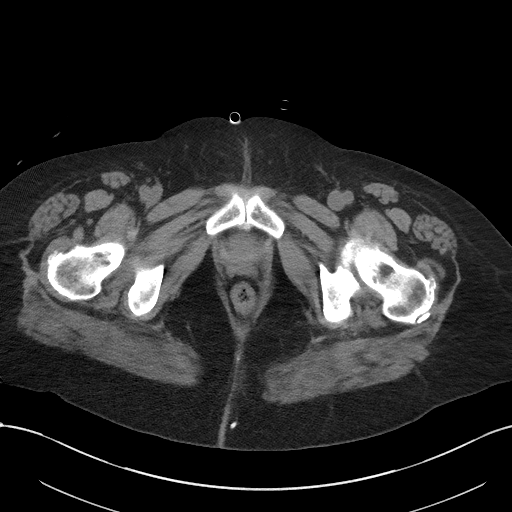
[im 20/96  soft-tissue]
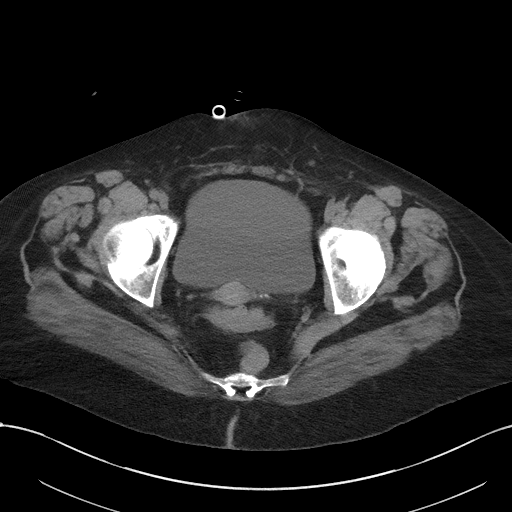
[im 28/96  soft-tissue]
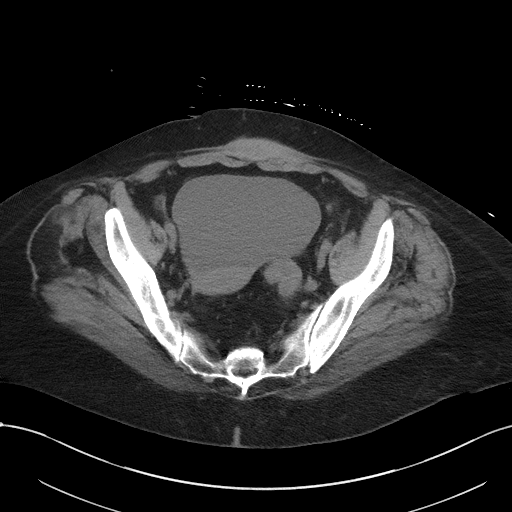
[im 32/96  soft-tissue]
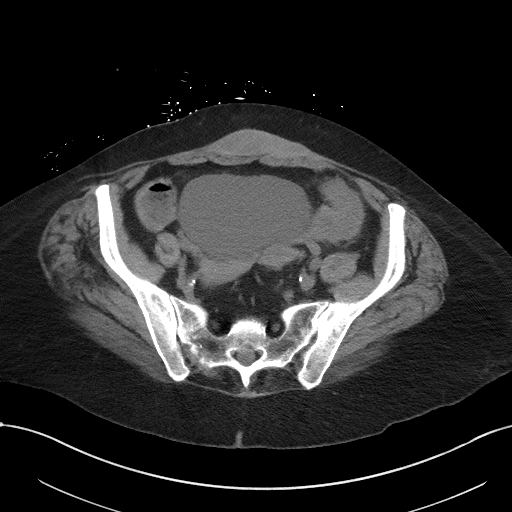
[im 40/96  soft-tissue]
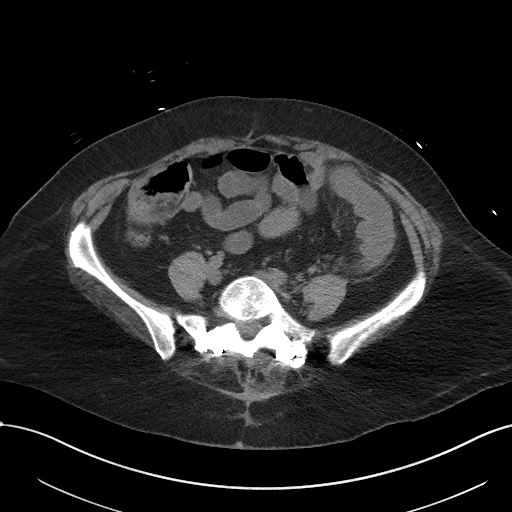
[im 48/96  soft-tissue]
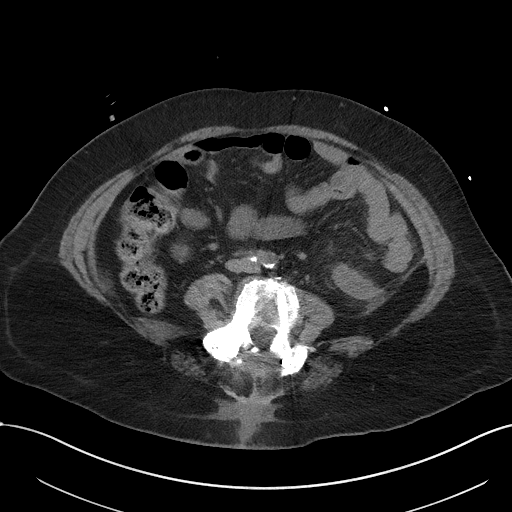
[im 56/96  soft-tissue]
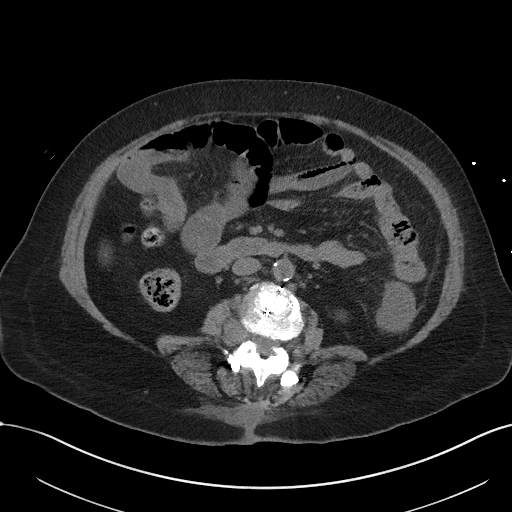
[im 64/96  soft-tissue]
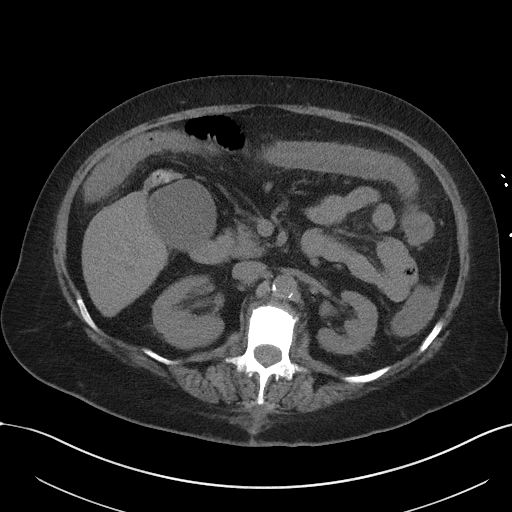
[im 64/96  bone]
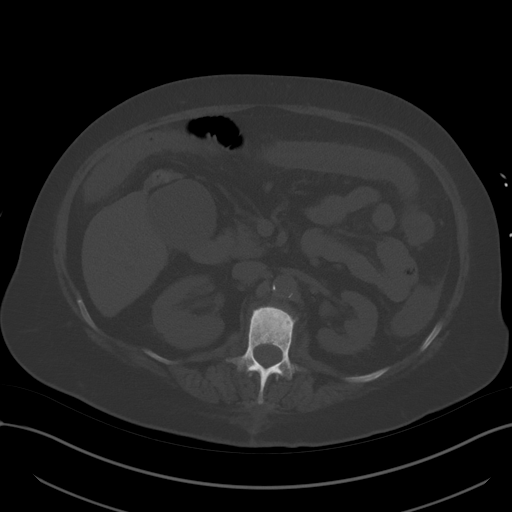
[im 68/96  soft-tissue]
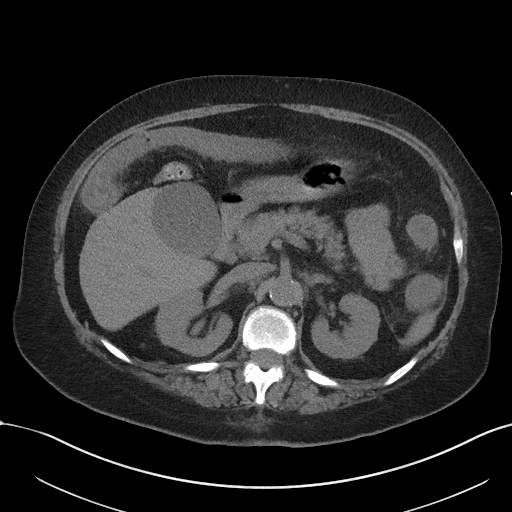
[im 76/96  soft-tissue]
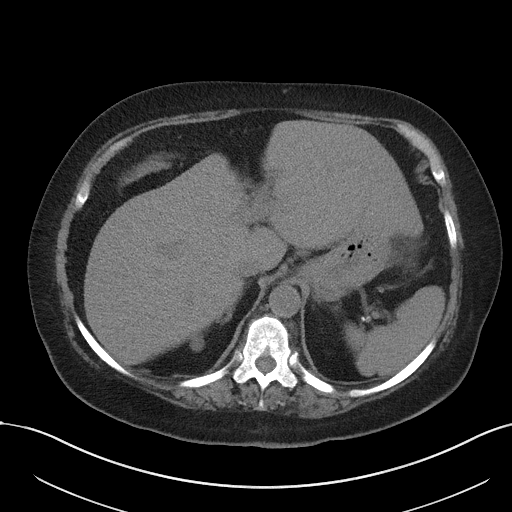
[im 84/96  soft-tissue]
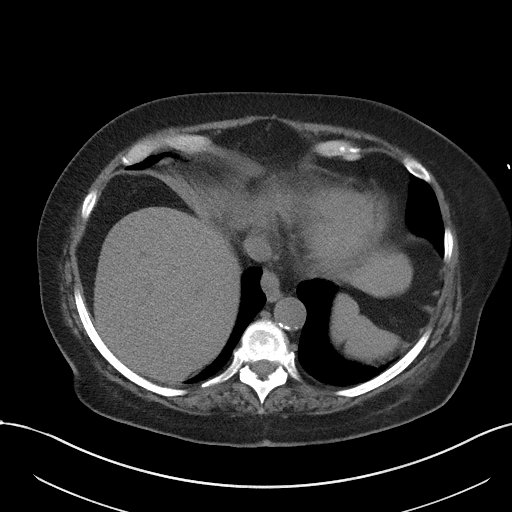
[im 92/96  soft-tissue]
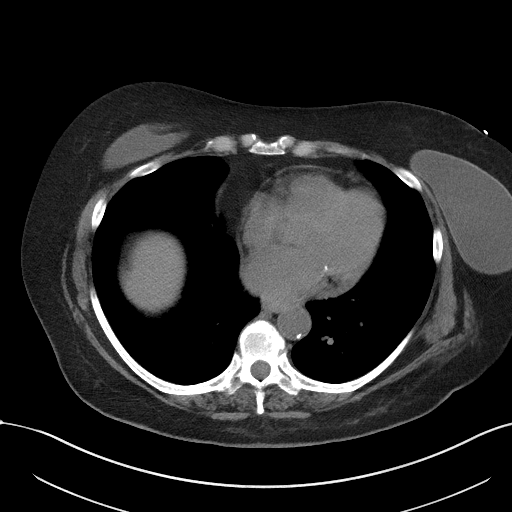

[Series 5: coronal st · coronal · 0.76mm/px · 3 of 100 slices shown]
[im 34/100  soft-tissue]
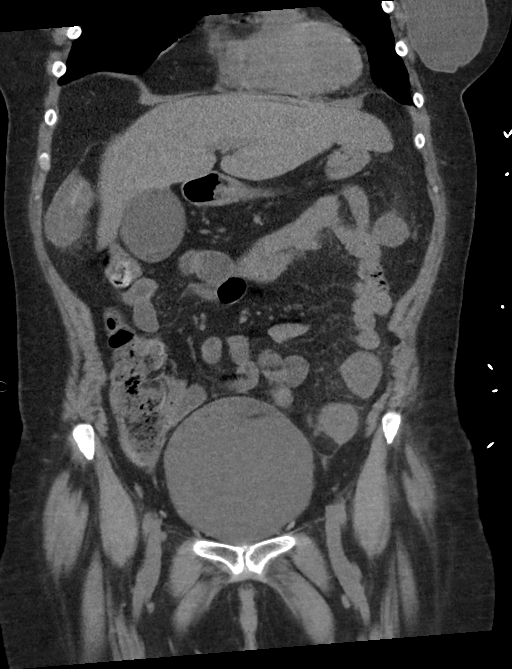
[im 45/100  soft-tissue]
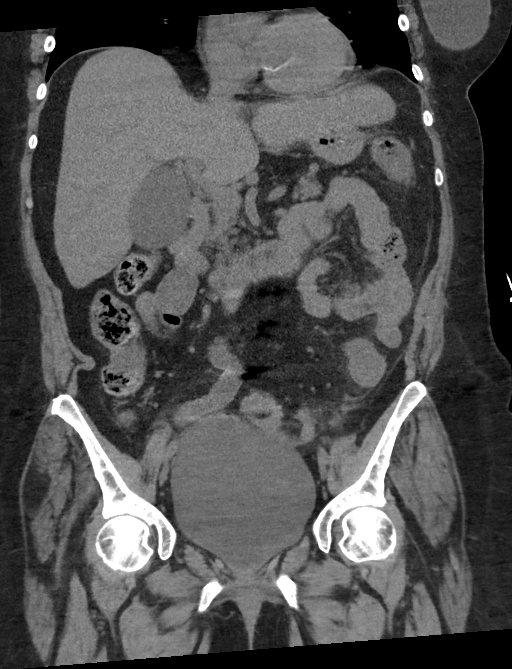
[im 56/100  soft-tissue]
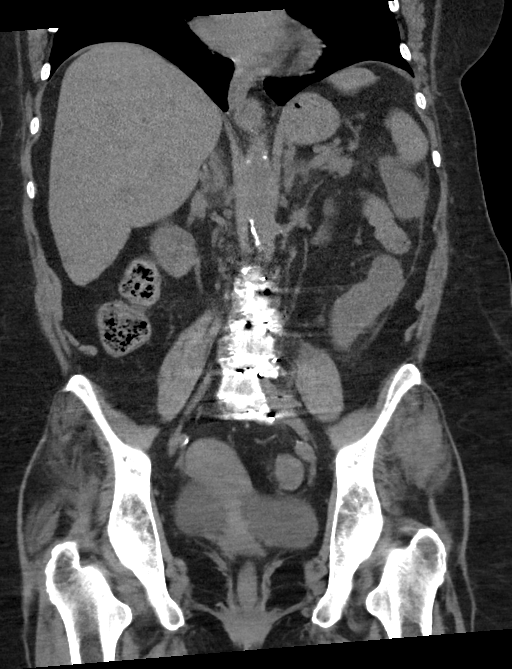

[16 of 46 positions shown; findings below may reference images not displayed]

FINDINGS: Lower chest: Small left lower lobe nodule posteriorly measures 3 mm,
stable since prior study. No acute abnormality.

Hepatobiliary: Small gallstone layering within the gallbladder. No
focal hepatic abnormality or biliary ductal dilatation.

Pancreas: No focal abnormality or ductal dilatation.

Spleen: No focal abnormality.  Normal size.

Adrenals/Urinary Tract: No adrenal abnormality. No focal renal
abnormality. No stones or hydronephrosis. Urinary bladder is
unremarkable.

Stomach/Bowel: There is diffuse wall thickening throughout the
colon. Mild stranding adjacent to the colon at the splenic flexure
and in the descending colon. Findings most compatible with colitis.
No evidence of bowel obstruction. Stomach and small bowel grossly
unremarkable.

Vascular/Lymphatic: Aortic atherosclerosis. No enlarged abdominal or
pelvic lymph nodes.

Reproductive: Uterus and adnexa unremarkable.  No mass.

Other: No free fluid or free air.

Musculoskeletal: No acute bony abnormality. Postoperative changes in
the lumbar spine
IMPRESSION: Wall thickening throughout much of the colon compatible with
pancolitis. Slight stranding/inflammation from the splenic flexure
through the descending colon.

Cholelithiasis.

Aortic atherosclerosis.

## 2019-07-20 MED ORDER — SODIUM CHLORIDE 0.9 % IV SOLN
Freq: Once | INTRAVENOUS | Status: AC
  Administered 2019-07-20: 11:00:00 via INTRAVENOUS

## 2019-07-20 MED ORDER — VENLAFAXINE HCL ER 75 MG PO CP24
225.0000 mg | ORAL_CAPSULE | Freq: Every day | ORAL | Status: DC
  Administered 2019-07-21 – 2019-07-22 (×2): 225 mg via ORAL
  Filled 2019-07-20 (×3): qty 1

## 2019-07-20 MED ORDER — GABAPENTIN 400 MG PO CAPS
400.0000 mg | ORAL_CAPSULE | Freq: Three times a day (TID) | ORAL | Status: DC
  Administered 2019-07-20 – 2019-07-21 (×3): 400 mg via ORAL
  Filled 2019-07-20 (×3): qty 1

## 2019-07-20 MED ORDER — OXYCODONE-ACETAMINOPHEN 5-325 MG PO TABS
1.0000 | ORAL_TABLET | Freq: Four times a day (QID) | ORAL | Status: DC | PRN
  Administered 2019-07-20 – 2019-07-22 (×7): 1 via ORAL
  Filled 2019-07-20 (×8): qty 1

## 2019-07-20 MED ORDER — HYDROMORPHONE HCL 1 MG/ML IJ SOLN
INTRAMUSCULAR | Status: AC
  Administered 2019-07-20: 1 mg via INTRAVENOUS
  Filled 2019-07-20: qty 1

## 2019-07-20 MED ORDER — HYDROMORPHONE HCL 1 MG/ML IJ SOLN
1.0000 mg | Freq: Once | INTRAMUSCULAR | Status: AC
  Administered 2019-07-20: 11:00:00 1 mg via INTRAVENOUS

## 2019-07-20 MED ORDER — ATORVASTATIN CALCIUM 20 MG PO TABS
40.0000 mg | ORAL_TABLET | Freq: Every day | ORAL | Status: DC
  Administered 2019-07-20: 40 mg via ORAL
  Filled 2019-07-20 (×2): qty 2

## 2019-07-20 MED ORDER — ENOXAPARIN SODIUM 40 MG/0.4ML ~~LOC~~ SOLN
40.0000 mg | SUBCUTANEOUS | Status: DC
  Administered 2019-07-20 – 2019-07-21 (×2): 40 mg via SUBCUTANEOUS
  Filled 2019-07-20 (×2): qty 0.4

## 2019-07-20 MED ORDER — OXYCODONE-ACETAMINOPHEN 10-325 MG PO TABS: 1.0000 | ORAL_TABLET | Freq: Four times a day (QID) | ORAL | Status: DC | PRN

## 2019-07-20 MED ORDER — FLUTICASONE PROPIONATE 50 MCG/ACT NA SUSP
2.0000 | Freq: Every day | NASAL | Status: DC | PRN
  Filled 2019-07-20: qty 16

## 2019-07-20 MED ORDER — OXYBUTYNIN CHLORIDE ER 10 MG PO TB24
10.0000 mg | ORAL_TABLET | Freq: Every day | ORAL | Status: DC
  Administered 2019-07-20: 10 mg via ORAL
  Filled 2019-07-20: qty 1

## 2019-07-20 MED ORDER — LABETALOL HCL 5 MG/ML IV SOLN: 10.0000 mg | INTRAVENOUS | Status: DC | PRN

## 2019-07-20 MED ORDER — METRONIDAZOLE IN NACL 5-0.79 MG/ML-% IV SOLN
500.0000 mg | Freq: Three times a day (TID) | INTRAVENOUS | Status: DC
  Administered 2019-07-20 – 2019-07-22 (×7): 500 mg via INTRAVENOUS
  Filled 2019-07-20 (×10): qty 100

## 2019-07-20 MED ORDER — HYDROMORPHONE HCL 1 MG/ML IJ SOLN
0.5000 mg | INTRAMUSCULAR | Status: DC | PRN
  Administered 2019-07-20 – 2019-07-21 (×2): 0.5 mg via INTRAVENOUS
  Filled 2019-07-20 (×2): qty 0.5

## 2019-07-20 MED ORDER — AMLODIPINE BESYLATE 5 MG PO TABS
10.0000 mg | ORAL_TABLET | Freq: Every day | ORAL | Status: DC
  Administered 2019-07-20: 10 mg via ORAL
  Filled 2019-07-20 (×2): qty 2

## 2019-07-20 MED ORDER — SODIUM CHLORIDE 0.9 % IV SOLN
INTRAVENOUS | Status: DC
  Administered 2019-07-20 – 2019-07-22 (×3): via INTRAVENOUS

## 2019-07-20 MED ORDER — ACETAMINOPHEN 500 MG PO TABS
1000.0000 mg | ORAL_TABLET | Freq: Once | ORAL | Status: AC
  Administered 2019-07-20: 1000 mg via ORAL
  Filled 2019-07-20: qty 2

## 2019-07-20 MED ORDER — SODIUM CHLORIDE 0.9 % IV BOLUS
1000.0000 mL | Freq: Once | INTRAVENOUS | Status: AC
  Administered 2019-07-20: 1000 mL via INTRAVENOUS

## 2019-07-20 MED ORDER — VANCOMYCIN 50 MG/ML ORAL SOLUTION
500.0000 mg | Freq: Four times a day (QID) | ORAL | Status: DC
  Administered 2019-07-20: 500 mg via ORAL
  Filled 2019-07-20 (×3): qty 10

## 2019-07-20 MED ORDER — HYDROMORPHONE HCL 1 MG/ML IJ SOLN
0.5000 mg | Freq: Once | INTRAMUSCULAR | Status: AC
  Administered 2019-07-20: 0.5 mg via INTRAVENOUS
  Filled 2019-07-20: qty 1

## 2019-07-20 MED ORDER — OXYCODONE HCL 5 MG PO TABS
5.0000 mg | ORAL_TABLET | Freq: Four times a day (QID) | ORAL | Status: DC | PRN
  Administered 2019-07-20 – 2019-07-22 (×7): 5 mg via ORAL
  Filled 2019-07-20 (×8): qty 1

## 2019-07-20 MED ORDER — VANCOMYCIN 50 MG/ML ORAL SOLUTION
125.0000 mg | Freq: Four times a day (QID) | ORAL | Status: DC
  Administered 2019-07-20: 125 mg via ORAL
  Filled 2019-07-20 (×3): qty 2.5

## 2019-07-20 NOTE — ED Notes (Signed)
Patient transported to CT 

## 2019-07-20 NOTE — ED Triage Notes (Signed)
Pt arrival via EMS from home due to abdominal pain. Pt complaining of nausea, diarrhea, and abdominal pain that started last night and worsened this morning.   Pt had back/neck surgery on 10/05.
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
## 2019-07-20 NOTE — Consult Note (Signed)
NAMEAubreigh Good  DOB: [birth year]  MRN: [id number]  Date/Time: 07/20/2019 6:51 PM  REQUESTING PROVIDER: Dr. Ronnie Good Subjective:  REASON FOR CONSULT: C. difficile colitis ? Lisa Good is a 68 y.o. female with history of hypertension, hyperlipidemia, recent extensive anterior and posterior cervical reconstruction poor cervical myelopathy and cervical kyphosis postop day 10 admitted from home with abdominal pain, diarrhea and nausea vomiting.  Patient was given Bactrim by her PCP on 07/15/2019 for 5 days. Patient was in [place] between 07/10/2019 until 07/14/2019.  She had surgery on 07/10/2019.  She was discharged on  medrol pak because of some difficulty in swallowing and to reduce the swelling She had increased frequency of urine and her PCP prescribed bactrim Not sure what antibiotics were given at [facility] for surgical prophylaxis and how long Last night she had profuse diarrhea with abdominal pain and nausea. She also felt hot  In the ED temp of 100.9, HR of 122 BP 156/55. WBC 26.5, Cr 2.10 , lactate 3,1 and K 6 CT abdomen revealed thickening of entire colonic wall This was concerning for cdiff and I am asked to see the patient for the same . Patient has a history of renal calculi status post Shockwave lithotripsy on 04/27/2019.  Also has incontinence and has had 2 reported urethral slings by OB/GYN and possible autologous fascial sling performed by a urologist in [facility] in the past.  She had seen Dr. Jeb Good in August for urge incontinence and was put on Ditropan  Past Medical History:  Diagnosis Date  . Allergy    seasonal  . Arthritis   . Depression   . GERD (gastroesophageal reflux disease)   . Hyperlipidemia   . Hypertension   . Neuromuscular disorder ([place])    peripheral neuropathy/feet  . Sleep apnea     Past Surgical History:  Procedure Laterality Date  . 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 EXTRACORPOREAL SHOCK WAVE LITHOTRIPSY Right 04/27/2019   Procedure: EXTRACORPOREAL  SHOCK WAVE LITHOTRIPSY (ESWL);  Surgeon: Lisa Co, MD;  Location: [facility] ORS;  Service: Urology;  Laterality: Right;  . JOINT REPLACEMENT Right    knee  . SPINE SURGERY     spinal fusion  . TUBAL LIGATION     Rt TKA  Social History   Socioeconomic History  . Marital status: Married    Spouse name: Not on file  . Number of children: Not on file  . Years of education: Not on file  . Highest education level: Not on file  Occupational History  . Not on file  Social Needs  . Financial resource strain: Not on file  . Food insecurity    Worry: Not on file    Inability: Not on file  . Transportation needs    Medical: Not on file    Non-medical: Not on file  Tobacco Use  . Smoking status: Never Smoker  . Smokeless tobacco: Never Used  Substance and Sexual Activity  . Alcohol use: Never    Frequency: Never  . Drug use: Never  . Sexual activity: Not on file  Lifestyle  . Physical activity    Days per week: Not on file    Minutes per session: Not on file  . Stress: Not on file  Relationships  . Social Herbalist on phone: Not on file    Gets together: Not on file    Attends religious service: Not on file    Active member of club or organization:  Not on file    Attends meetings of clubs or organizations: Not on file    Relationship status: Not on file  . Intimate partner violence    Fear of current or ex partner: Not on file    Emotionally abused: Not on file    Physically abused: Not on file    Forced sexual activity: Not on file  Other Topics Concern  . Not on file  Social History Narrative  . Not on file    History reviewed. No pertinent family history. Allergies  Allergen Reactions  . Contrast Media [Iodinated Diagnostic Agents] Hives  . Cephalexin Nausea Only    nausea   ? Current Facility-Administered Medications  Medication Dose Route Frequency Provider Last Rate Last Dose  . 0.9 %  sodium chloride infusion   Intravenous Continuous Ojie,  Jude, MD 100 mL/hr at 07/20/19 1737    . amLODipine (NORVASC) tablet 10 mg  10 mg Oral Daily Ojie, Jude, MD      . atorvastatin (LIPITOR) tablet 40 mg  40 mg Oral Daily Ojie, Jude, MD      . enoxaparin (LOVENOX) injection 40 mg  40 mg Subcutaneous Q24H Ojie, Jude, MD      . fluticasone (FLONASE) 50 MCG/ACT nasal spray 2 spray  2 spray Each Nare Daily PRN Ojie, Jude, MD      . gabapentin (NEURONTIN) capsule 400 mg  400 mg Oral TID Ojie, Jude, MD      . HYDROmorphone (DILAUDID) injection 0.5 mg  0.5 mg Intravenous Q4H PRN Ojie, Jude, MD      . labetalol (NORMODYNE) injection 10 mg  10 mg Intravenous Q4H PRN Ojie, Jude, MD      . metroNIDAZOLE (FLAGYL) IVPB 500 mg  500 mg Intravenous Q8H Lisa Newport, MD   Stopped at 07/20/19 1440  . oxybutynin (DITROPAN-XL) 24 hr tablet 10 mg  10 mg Oral Daily Ojie, Jude, MD      . oxyCODONE-acetaminophen (PERCOCET/ROXICET) 5-325 MG per tablet 1 tablet  1 tablet Oral Q6H PRN Ojie, Jude, MD   1 tablet at 07/20/19 1539   And  . oxyCODONE (Oxy IR/ROXICODONE) immediate release tablet 5 mg  5 mg Oral Q6H PRN Lisa Good, Jude, MD   5 mg at 07/20/19 1539  . vancomycin (VANCOCIN) 50 mg/mL oral solution 500 mg  500 mg Oral QID Ojie, Jude, MD      . venlafaxine XR (EFFEXOR-XR) 24 hr capsule 225 mg  225 mg Oral Q breakfast Ojie, Jude, MD         Abtx:  Anti-infectives (From admission, onward)   Start     Dose/Rate Route Frequency Ordered Stop   07/20/19 2000  vancomycin (VANCOCIN) 50 mg/mL oral solution 500 mg     500 mg Oral 4 times daily 07/20/19 1404 07/30/19 1759   07/20/19 1400  vancomycin (VANCOCIN) 50 mg/mL oral solution 125 mg  Status:  Discontinued     125 mg Oral 4 times daily 07/20/19 1233 07/20/19 1404   07/20/19 1245  metroNIDAZOLE (FLAGYL) IVPB 500 mg     500 mg 100 mL/hr over 60 Minutes Intravenous Every 8 hours 07/20/19 1233        REVIEW OF SYSTEMS:  Const:  fever,  chills, negative weight loss Eyes: negative diplopia or visual changes, negative  eye pain ENT: negative coryza, negative sore throat Resp: negative cough, hemoptysis,has  dyspnea Cards: negative for chest pain, palpitations, lower extremity edema GU: difficulty passing urine GI:  abdominal pain,  diarrhea, no bleeding Skin: negative for rash and pruritus Heme: negative for easy bruising and gum/nose bleeding MS: has back pain Neurolo:negative for headaches, dizziness, vertigo, memory problems  Psych: negative for feelings of anxiety, depression  Endocrine: negative for thyroid, diabetes Allergy/Immunology- as listed above Objective:  VITALS:  BP (!) 163/66   Pulse (!) 120   Temp 100.2 F (37.9 C) (Oral)   Resp (!) 23   Ht 5' 4"  (1.626 m)   Wt 83.9 kg   SpO2 97%   BMI 31.76 kg/m  PHYSICAL EXAM:  General: awake, some distress,pale.  Head: Normocephalic, without obvious abnormality, atraumatic. Eyes: Conjunctivae clear, anicteric sclerae. Pupils are equal ENT Nares normal. No drainage or sinus tenderness. Oral mucosa dry Neck: has a cervical collar and brace Back: No CVA tenderness. Lungs: b/l air entry  Heart: s1s2 tachycardia. Abdomen: Soft, bladder felt about the pubic bone Extremities: atraumatic, no cyanosis. No edema. No clubbing Rt knee scar Skin: No rashes or lesions. Or bruising Lymph: Cervical, supraclavicular normal. Neurologic: Grossly non-focal Pertinent Labs Lab Results CBC    Component Value Date/Time   WBC 26.5 (H) 07/20/2019 1025   RBC 3.35 (L) 07/20/2019 1025   HGB 9.6 (L) 07/20/2019 1025   HCT 30.2 (L) 07/20/2019 1025   PLT 460 (H) 07/20/2019 1025   MCV 90.1 07/20/2019 1025   MCH 28.7 07/20/2019 1025   MCHC 31.8 07/20/2019 1025   RDW 14.3 07/20/2019 1025   LYMPHSABS 1.5 07/20/2019 1025   MONOABS 1.3 (H) 07/20/2019 1025   EOSABS 0.0 07/20/2019 1025   BASOSABS 0.1 07/20/2019 1025    CMP Latest Ref Rng & Units 07/20/2019 07/20/2019  Glucose 70 - 99 mg/dL 109(H) 131(H)  BUN 8 - 23 mg/dL 41(H) 46(H)  Creatinine 0.44 -  1.00 mg/dL 1.82(H) 2.10(H)  Sodium 135 - 145 mmol/L 133(L) 133(L)  Potassium 3.5 - 5.1 mmol/L 5.8(H) 6.0(H)  Chloride 98 - 111 mmol/L 104 95(L)  CO2 22 - 32 mmol/L 24 21(L)  Calcium 8.9 - 10.3 mg/dL 7.5(L) 8.4(L)  Total Protein 6.5 - 8.1 g/dL - 6.1(L)  Total Bilirubin 0.3 - 1.2 mg/dL - 0.5  Alkaline Phos 38 - 126 U/L - 100  AST 15 - 41 U/L - 32  ALT 0 - 44 U/L - 15      Microbiology:BC and UC pending  IMAGING RESULTS:  I have personally reviewed the films ? Impression/Recommendation ? ?Acute onset diarrhea, with abdominal pain and fever and CT scan showing pan colitis- Cdiff colitis suspected and because of severe leucocytosis/ AKI/Hyperlactatemia started on high dose vancomycin and IV metronidazole-  Acute urinary retention- oxybutynin can be contributing to it as well- DC that  Leucocytosis- likely due to cdiff- colitis I am unable to examine her neck surgical site due to collar- will check with Dr.Yarbrough ?  HTN- on amlodipine   ___________________________________________________ Discussed with patient, daughter and requesting provider Note:  This document was prepared using Dragon voice recognition software and may include unintentional dictation errors.

## 2019-07-20 NOTE — Consult Note (Signed)
    Attending Progress Note  History: Lisa Good is here for new onset of GI distress, concerning for C diff colitis.  She is POD10 from extensive anterior and posterior cervical reconstruction for cervical myelopathy and cervical kyphosis.  She has done extremely well from that, but contact my office this AM due to abrupt onset of severe GI distress.  Of note, she has been on abx for a UTI.  Physical Exam: Vitals:   07/20/19 1500 07/20/19 1659  BP: (!) 163/66   Pulse: (!) 120   Resp: (!) 23   Temp:  100.2 F (37.9 C)  SpO2: 97%     AA Ox3 CNI  Strength:5/5 throughout BUE and BLE  Data:  Recent Labs  Lab 07/20/19 1025 07/20/19 1305  NA 133* 133*  K 6.0* 5.8*  CL 95* 104  CO2 21* 24  BUN 46* 41*  CREATININE 2.10* 1.82*  GLUCOSE 131* 109*  CALCIUM 8.4* 7.5*   Recent Labs  Lab 07/20/19 1025  AST 32  ALT 15  ALKPHOS 100     Recent Labs  Lab 07/20/19 1025  WBC 26.5*  HGB 9.6*  HCT 30.2*  PLT 460*   No results for input(s): APTT, INR in the last 168 hours.       Other tests/results: CT reviewed - colitis  Assessment/Plan:  Lisa Good is stable from her cervical procedure, and now presents with new onset colitis being managed by medicine/ID.  - wear brace when OOB - If she remains in hospital through the weekend, we will remove her staples prior to discharge - Other care per medical physicians  Meade Maw MD, [facility] Department of Neurosurgery

## 2019-07-20 NOTE — ED Notes (Signed)
Date and time results received: 07/20/19 1130 (use smartphrase ".now" to insert current time)  Test: Lactic Critical Value: 3.1  Name of Provider Notified: Jimmye Norman  Orders Received? Or Actions Taken?:

## 2019-07-21 ENCOUNTER — Inpatient Hospital Stay: Payer: Medicare Other

## 2019-07-21 DIAGNOSIS — K5100 Ulcerative (chronic) pancolitis without complications: Secondary | ICD-10-CM

## 2019-07-21 DIAGNOSIS — R109 Unspecified abdominal pain: Secondary | ICD-10-CM

## 2019-07-21 DIAGNOSIS — Z960 Presence of urogenital implants: Secondary | ICD-10-CM

## 2019-07-21 LAB — CBC WITH DIFFERENTIAL/PLATELET
Abs Immature Granulocytes: 0.1 10*3/uL — ABNORMAL HIGH (ref 0.00–0.07)
Basophils Absolute: 0.1 10*3/uL (ref 0.0–0.1)
Basophils Relative: 0 %
Eosinophils Absolute: 0 10*3/uL (ref 0.0–0.5)
Eosinophils Relative: 0 %
HCT: 27.1 % — ABNORMAL LOW (ref 36.0–46.0)
Hemoglobin: 8.4 g/dL — ABNORMAL LOW (ref 12.0–15.0)
Immature Granulocytes: 1 %
Lymphocytes Relative: 5 %
Lymphs Abs: 1 10*3/uL (ref 0.7–4.0)
MCH: 28.5 pg (ref 26.0–34.0)
MCHC: 31 g/dL (ref 30.0–36.0)
MCV: 91.9 fL (ref 80.0–100.0)
Monocytes Absolute: 0.8 10*3/uL (ref 0.1–1.0)
Monocytes Relative: 4 %
Neutro Abs: 17.3 10*3/uL — ABNORMAL HIGH (ref 1.7–7.7)
Neutrophils Relative %: 90 %
Platelets: 312 10*3/uL (ref 150–400)
RBC: 2.95 MIL/uL — ABNORMAL LOW (ref 3.87–5.11)
RDW: 14.7 % (ref 11.5–15.5)
WBC: 19.3 10*3/uL — ABNORMAL HIGH (ref 4.0–10.5)
nRBC: 0 % (ref 0.0–0.2)

## 2019-07-21 LAB — COMPREHENSIVE METABOLIC PANEL
ALT: 9 U/L (ref 0–44)
AST: 20 U/L (ref 15–41)
Albumin: 2.6 g/dL — ABNORMAL LOW (ref 3.5–5.0)
Alkaline Phosphatase: 77 U/L (ref 38–126)
Anion gap: 7 (ref 5–15)
BUN: 27 mg/dL — ABNORMAL HIGH (ref 8–23)
CO2: 23 mmol/L (ref 22–32)
Calcium: 7.6 mg/dL — ABNORMAL LOW (ref 8.9–10.3)
Chloride: 107 mmol/L (ref 98–111)
Creatinine, Ser: 1.06 mg/dL — ABNORMAL HIGH (ref 0.44–1.00)
GFR calc Af Amer: >60 mL/min (ref >60)
GFR calc non Af Amer: 54 mL/min — ABNORMAL LOW (ref >60)
Glucose, Bld: 95 mg/dL (ref 70–99)
Potassium: 4.6 mmol/L (ref 3.5–5.1)
Sodium: 137 mmol/L (ref 135–145)
Total Bilirubin: 0.7 mg/dL (ref 0.3–1.2)
Total Protein: 5.3 g/dL — ABNORMAL LOW (ref 6.5–8.1)

## 2019-07-21 LAB — C DIFFICILE QUICK SCREEN W PCR REFLEX
C Diff antigen: NEGATIVE
C Diff interpretation: NOT DETECTED
C Diff toxin: NEGATIVE

## 2019-07-21 LAB — URINE CULTURE: Culture: NO GROWTH

## 2019-07-21 LAB — MAGNESIUM: Magnesium: 2.1 mg/dL (ref 1.7–2.4)

## 2019-07-21 LAB — PHOSPHORUS: Phosphorus: 3.4 mg/dL (ref 2.5–4.6)

## 2019-07-21 IMAGING — DX DG ABDOMEN 1V
2 series · 2 of 2 positions shown · non-contrast
Comparison: CT [DATE].

CLINICAL DATA: Colitis.

EXAM:
ABDOMEN - 1 VIEW

[abdomen supine (1 of 2)]
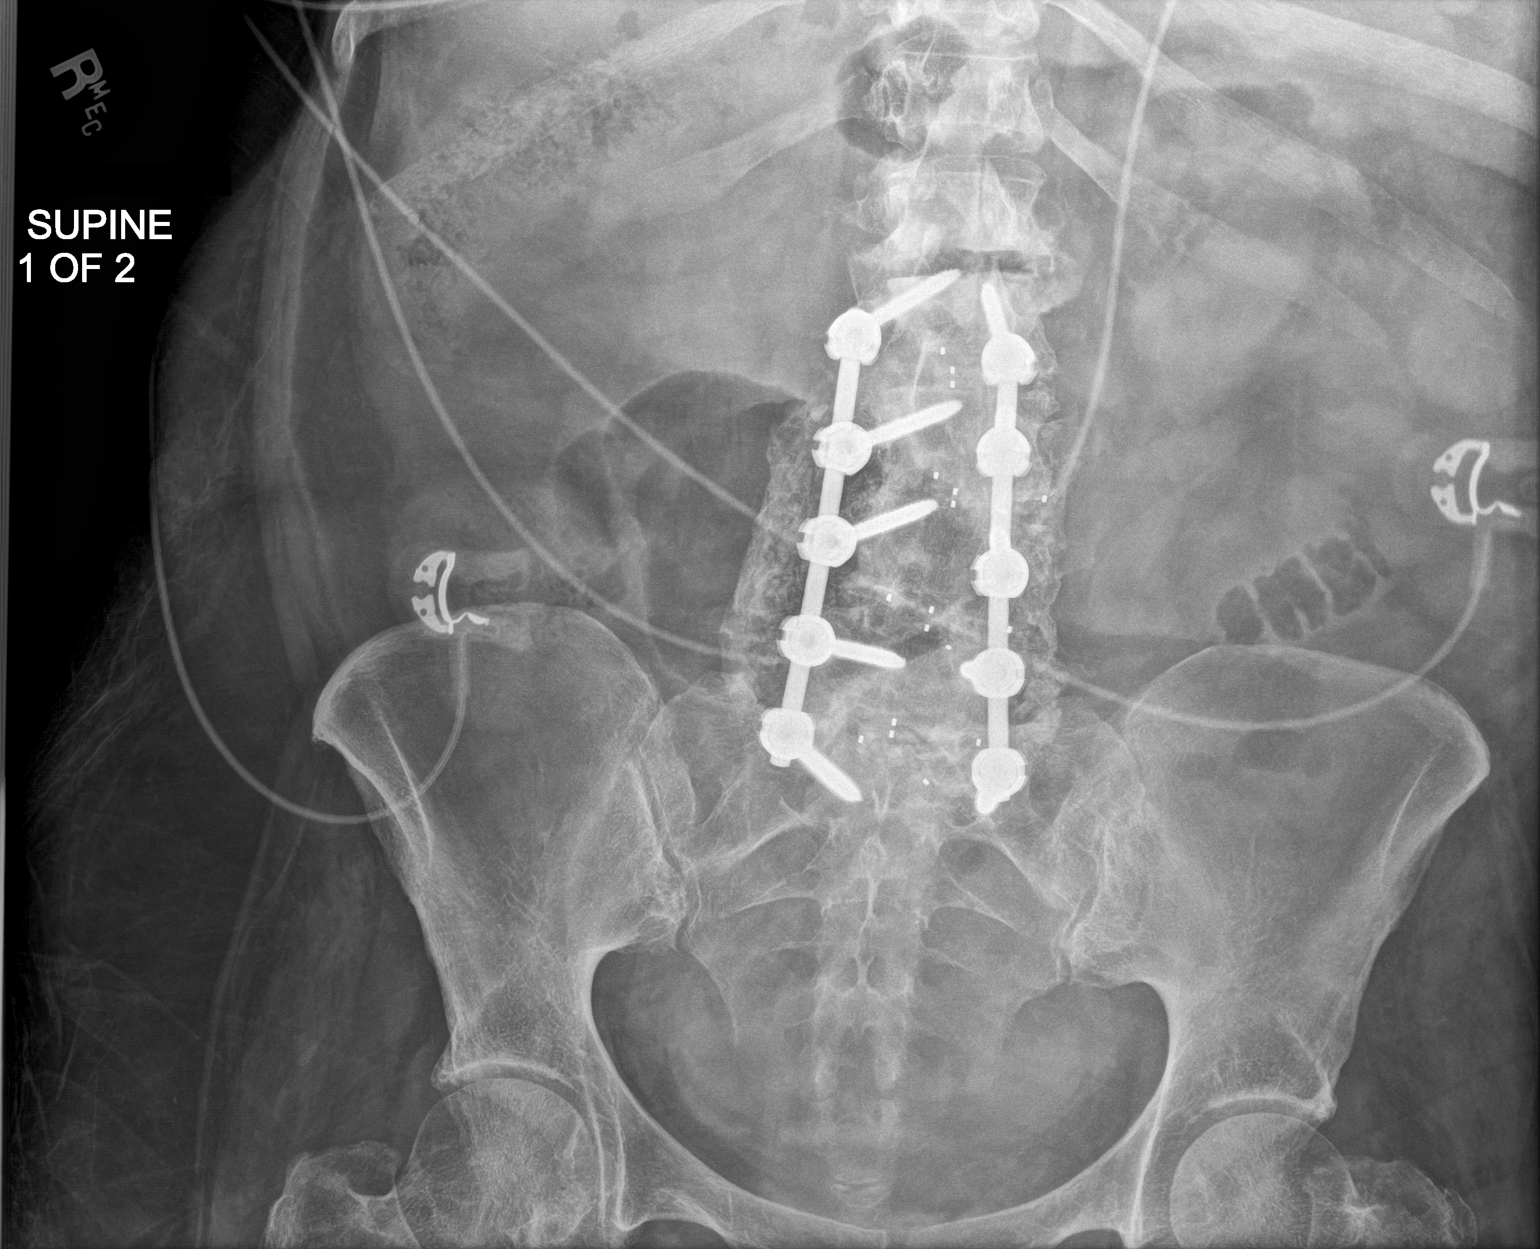

[abdomen supine (2 of 2)]
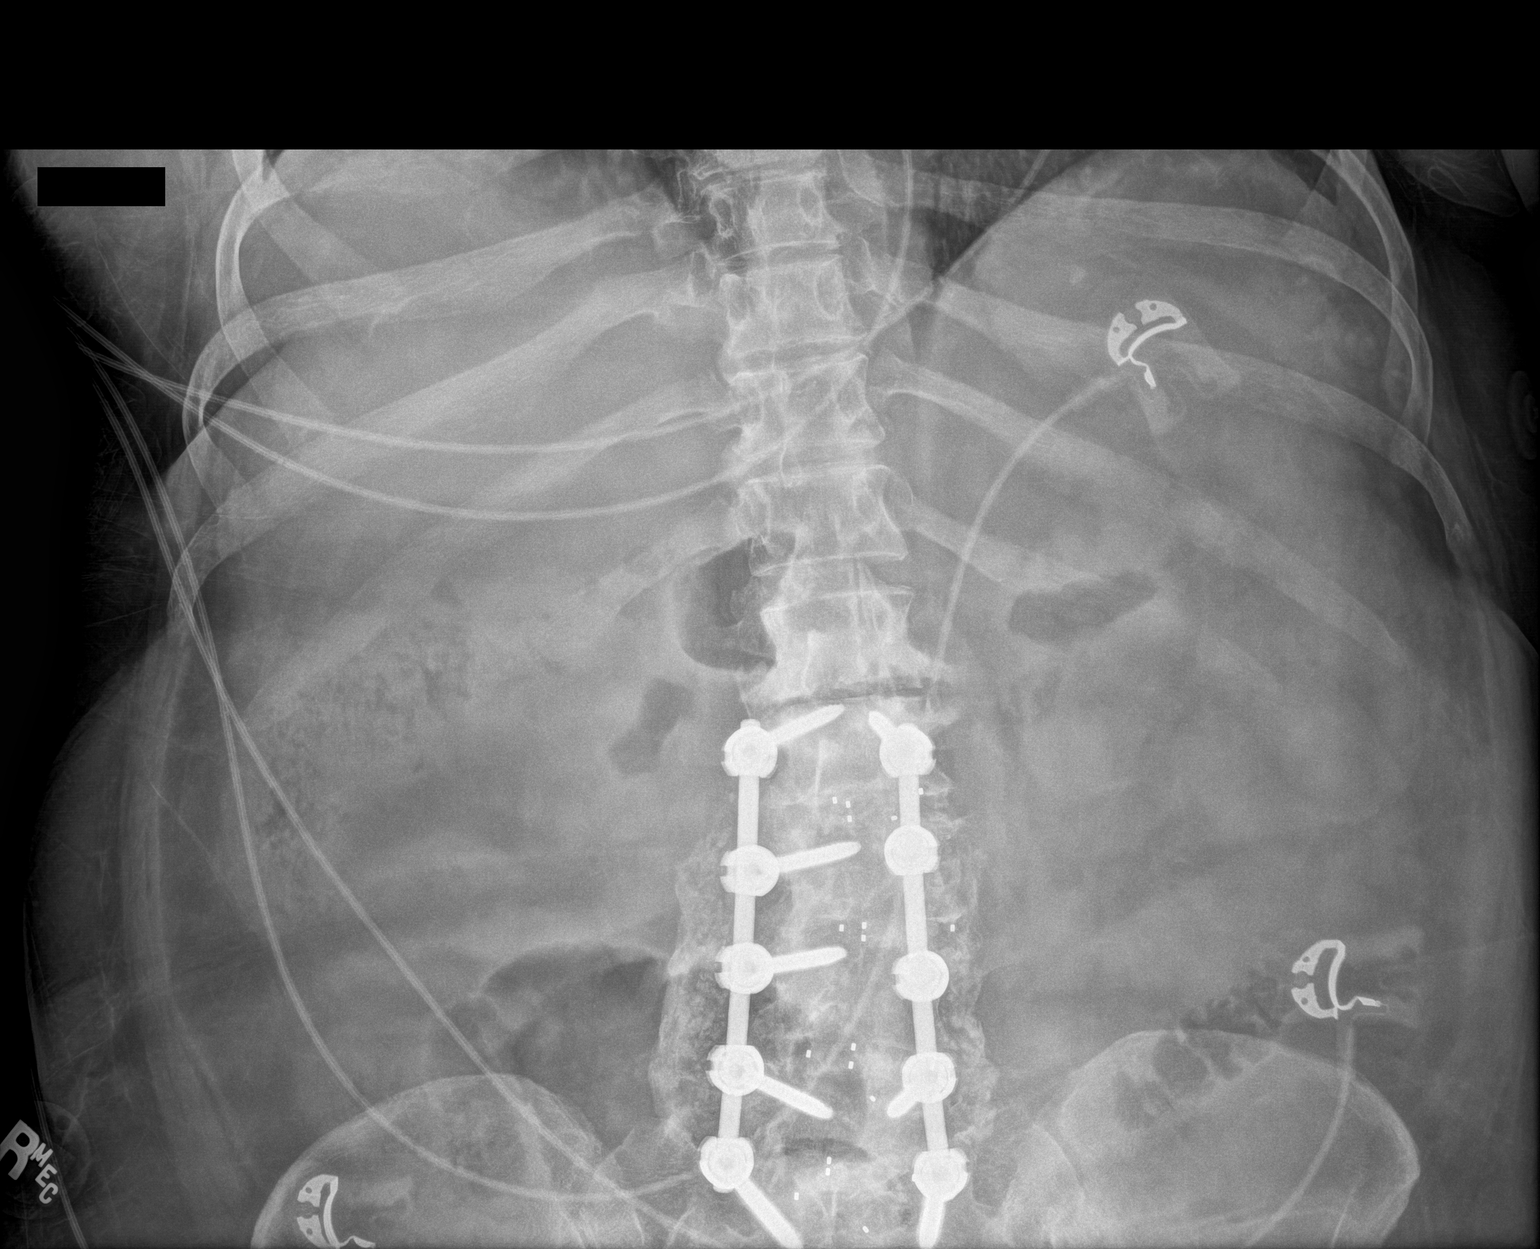

[2 of 2 positions shown; findings below may reference images not displayed]

FINDINGS: No bowel distention. Stool in the colon. No free air. Prior
lumbosacral spine fusion. Hardware intact. Degenerative change
lumbar spine and both hips. No acute bony abnormality.
IMPRESSION: No acute abnormality identified.  No bowel distention.  No free air.

## 2019-07-21 MED ORDER — METHOCARBAMOL 500 MG PO TABS
500.0000 mg | ORAL_TABLET | Freq: Four times a day (QID) | ORAL | Status: DC
  Filled 2019-07-21 (×4): qty 1

## 2019-07-21 MED ORDER — VANCOMYCIN 50 MG/ML ORAL SOLUTION
125.0000 mg | Freq: Four times a day (QID) | ORAL | Status: DC
  Administered 2019-07-21 – 2019-07-22 (×6): 125 mg via ORAL
  Filled 2019-07-21 (×8): qty 2.5

## 2019-07-21 MED ORDER — METHOCARBAMOL 500 MG PO TABS
1000.0000 mg | ORAL_TABLET | Freq: Four times a day (QID) | ORAL | Status: DC
  Administered 2019-07-21 – 2019-07-22 (×5): 1000 mg via ORAL
  Filled 2019-07-21 (×7): qty 2

## 2019-07-21 MED ORDER — CHLORHEXIDINE GLUCONATE CLOTH 2 % EX PADS
6.0000 | MEDICATED_PAD | Freq: Every day | CUTANEOUS | Status: DC
  Administered 2019-07-21: 6 via TOPICAL

## 2019-07-21 MED ORDER — AMLODIPINE BESYLATE 10 MG PO TABS
10.0000 mg | ORAL_TABLET | Freq: Every evening | ORAL | Status: DC
  Administered 2019-07-21: 10 mg via ORAL
  Filled 2019-07-21: qty 1

## 2019-07-21 MED ORDER — ATORVASTATIN CALCIUM 20 MG PO TABS
40.0000 mg | ORAL_TABLET | Freq: Every evening | ORAL | Status: DC
  Administered 2019-07-21: 40 mg via ORAL
  Filled 2019-07-21: qty 2

## 2019-07-21 MED ORDER — GABAPENTIN 400 MG PO CAPS
800.0000 mg | ORAL_CAPSULE | Freq: Four times a day (QID) | ORAL | Status: DC
  Administered 2019-07-21 – 2019-07-22 (×5): 800 mg via ORAL
  Filled 2019-07-21 (×5): qty 2

## 2019-07-21 MED ORDER — TAMSULOSIN HCL 0.4 MG PO CAPS
0.4000 mg | ORAL_CAPSULE | Freq: Every day | ORAL | Status: DC
  Administered 2019-07-21: 0.4 mg via ORAL
  Filled 2019-07-21: qty 1

## 2019-07-21 MED ORDER — LISINOPRIL 20 MG PO TABS
40.0000 mg | ORAL_TABLET | Freq: Every day | ORAL | Status: DC
  Administered 2019-07-21 – 2019-07-22 (×2): 40 mg via ORAL
  Filled 2019-07-21 (×2): qty 2

## 2019-07-21 MED ORDER — CELECOXIB 200 MG PO CAPS
200.0000 mg | ORAL_CAPSULE | Freq: Two times a day (BID) | ORAL | Status: DC | PRN
  Filled 2019-07-21: qty 1

## 2019-07-21 NOTE — Progress Notes (Signed)
[place] [facility] [place] NAME: Lisa Good    MR#:  [id number]  DATE OF BIRTH:  29-Jul-1951  SUBJECTIVE:  Patient on chronic pain medications and is having pain this morning Diarrhea improving  REVIEW OF SYSTEMS:    Review of Systems  Constitutional: Negative for fever, chills weight loss HENT: Negative for ear pain, nosebleeds, congestion, facial swelling, rhinorrhea, neck pain, neck stiffness and ear discharge.   Respiratory: Negative for cough, shortness of breath, wheezing  Cardiovascular: Negative for chest pain, palpitations and leg swelling.  Gastrointestinal: Negative for heartburn, ++abdominal pain, vomiting, ++ diarrhea NO consitpation Genitourinary: Negative for dysuria, urgency, frequency, hematuria Musculoskeletal: Negative for back pain or joint pain Neurological: Negative for dizziness, seizures, syncope, focal weakness,  numbness and headaches.  Hematological: Does not bruise/bleed easily.  Psychiatric/Behavioral: Negative for hallucinations, confusion, dysphoric mood    Tolerating Diet: yes      DRUG ALLERGIES:   Allergies  Allergen Reactions  . Contrast Media [Iodinated Diagnostic Agents] Hives  . Cephalexin Nausea Only    nausea    VITALS:  Blood pressure (!) 155/57, pulse (!) 111, temperature 99 F (37.2 C), temperature source Oral, resp. rate 18, height 5' 4"  (1.626 m), weight 83.9 kg, SpO2 95 %.  PHYSICAL EXAMINATION:  Constitutional: Appears well-developed and well-nourished. No distress. HENT: Normocephalic. Marland Kitchen Oropharynx is clear and moist.  Eyes: Conjunctivae and EOM are normal. PERRLA, no scleral icterus.  Neck: Normal ROM. Neck supple. No JVD. No tracheal deviation. CVS: RRR, S1/S2 +, no murmurs, no gallops, no carotid bruit.  Pulmonary: Effort and breath sounds normal, no stridor, rhonchi, wheezes, rales.  Abdominal: Soft. BS +,  no distension, ++ tenderness, NO rebound or guarding.   Musculoskeletal: Normal range of motion. No edema and no tenderness.  Neuro: Alert. CN 2-12 grossly intact. No focal deficits. Skin: Skin is warm and dry. No rash noted. Psychiatric: Normal mood and affect.      LABORATORY PANEL:   CBC Recent Labs  Lab 07/21/19 0602  WBC 19.3*  HGB 8.4*  HCT 27.1*  PLT 312   ------------------------------------------------------------------------------------------------------------------  Chemistries  Recent Labs  Lab 07/21/19 0602  NA 137  K 4.6  CL 107  CO2 23  GLUCOSE 95  BUN 27*  CREATININE 1.06*  CALCIUM 7.6*  MG 2.1  AST 20  ALT 9  ALKPHOS 77  BILITOT 0.7   ------------------------------------------------------------------------------------------------------------------  Cardiac Enzymes No results for input(s): TROPONINI in the last 168 hours. ------------------------------------------------------------------------------------------------------------------  RADIOLOGY:  Ct Abdomen Pelvis Wo Contrast  Result Date: 07/20/2019 CLINICAL DATA:  Abdominal pain EXAM: CT ABDOMEN AND PELVIS WITHOUT CONTRAST TECHNIQUE: Multidetector CT imaging of the abdomen and pelvis was performed following the standard protocol without IV contrast. COMPARISON:  04/18/2019 FINDINGS: Lower chest: Small left lower lobe nodule posteriorly measures 3 mm, stable since prior study. No acute abnormality. Hepatobiliary: Small gallstone layering within the gallbladder. No focal hepatic abnormality or biliary ductal dilatation. Pancreas: No focal abnormality or ductal dilatation. Spleen: No focal abnormality.  Normal size. Adrenals/Urinary Tract: No adrenal abnormality. No focal renal abnormality. No stones or hydronephrosis. Urinary bladder is unremarkable. Stomach/Bowel: There is diffuse wall thickening throughout the colon. Mild stranding adjacent to the colon at the splenic flexure and in the descending colon. Findings most compatible with colitis. No  evidence of bowel obstruction. Stomach and small bowel grossly unremarkable. Vascular/Lymphatic: Aortic atherosclerosis. No enlarged abdominal or pelvic lymph nodes. Reproductive: Uterus and adnexa unremarkable.  No mass. Other:  No free fluid or free air. Musculoskeletal: No acute bony abnormality. Postoperative changes in the lumbar spine IMPRESSION: Wall thickening throughout much of the colon compatible with pancolitis. Slight stranding/inflammation from the splenic flexure through the descending colon. Cholelithiasis. Aortic atherosclerosis. Electronically Signed   By: Rolm Baptise M.D.   On: 07/20/2019 11:50   Dg Abd 1 View  Result Date: 07/21/2019 CLINICAL DATA:  Colitis. EXAM: ABDOMEN - 1 VIEW COMPARISON:  CT 07/20/2019. FINDINGS: No bowel distention. Stool in the colon. No free air. Prior lumbosacral spine fusion. Hardware intact. Degenerative change lumbar spine and both hips. No acute bony abnormality. IMPRESSION: No acute abnormality identified.  No bowel distention.  No free air. Electronically Signed   By: [place]   On: 07/21/2019 09:01   Dg Chest Port 1 View  Result Date: 07/20/2019 CLINICAL DATA:  Pt arrival via EMS from home due to abdominal pain. Pt complaining of nausea, diarrhea, and abdominal pain that started last night and worsened this morning. Pt had back/neck surgery on 10/05. Nonsmoker. Hx of HTN. EXAM: PORTABLE CHEST 1 VIEW COMPARISON:  None. FINDINGS: Heart size is normal. Shallow lung inflation. Minimal LEFT lung atelectasis. No pulmonary edema. Previous cervicothoracic fusion. IMPRESSION: 1. Shallow inflation. 2. No focal acute pulmonary abnormality. Electronically Signed   By: Nolon Nations M.D.   On: 07/20/2019 10:50     ASSESSMENT AND PLAN:   68 year old female with history of hypertension and recent extensive anterior and posterior cervical reconstruction for cervical myelopathy who presented she the emergency room due to diarrhea, nausea and  vomiting.   1.  Sepsis: Patient presented with increased white blood cell count and tachycardia.  Sepsis due to pancolitis.  C. difficile testing is negative.  GI panel pending.  ID is recommending to continue patient on Flagyl and vancomycin for now. KUB this morning shows no acute pathology.  2.  Acute kidney injury due to profound dehydration and diarrhea: This is improved with IV fluids  3.  Hyponatremia from diarrhea which is improved with IV fluids  4.  Chronic pain: We will restart patient's outpatient medications. 5.  Essential hypertension: Continue Norvasc and lisinopril  6.  Hyperlipidemia: Continue statin  Management plans discussed with the patient and daughter and she is in agreement.  CODE STATUS: full  TOTAL TIME TAKING CARE OF THIS PATIENT: 30 minutes.     POSSIBLE D/C 2-3 days, DEPENDING ON CLINICAL CONDITION.   Bettey Costa M.D on 07/21/2019 at 10:50 AM  Between 7am to 6pm - Pager - [phone] After 6pm go to [email or web address] - password [email or web address] [facility]  Office  [phone]  CC: Primary care physician; [name], MD  Note: This dictation was prepared with Dragon dictation along with smaller phrase technology. Any transcriptional errors that result from this process are unintentional.
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
## 2019-07-21 NOTE — Progress Notes (Signed)
Date of Admission:  07/20/2019     Subjective: Patient still has diarrhea. Abdominal pain slightly better No fever Poor appetite Had urinary retention had to be catheterized annually 1200 mL of urine was drained through a straight in and out cath but then a Foley had to be placed when another 1000 mL of urine was drained.  Medications:   amLODipine  10 mg Oral QPM   atorvastatin  40 mg Oral QPM   Chlorhexidine Gluconate Cloth  6 each Topical Daily   enoxaparin (LOVENOX) injection  40 mg Subcutaneous Q24H   gabapentin  400 mg Oral TID   lisinopril  40 mg Oral Daily   methocarbamol  500 mg Oral QID   tamsulosin  0.4 mg Oral QPC supper   vancomycin  125 mg Oral QID   venlafaxine XR  225 mg Oral Q breakfast    Objective: Vital signs in last 24 hours: Temp:  [97.8 F (36.6 C)-100.2 F (37.9 C)] 99 F (37.2 C) (10/16 0430) Pulse Rate:  [109-121] 111 (10/16 1018) Resp:  [13-25] 18 (10/16 0430) BP: (138-180)/(45-110) 155/57 (10/16 1018) SpO2:  [93 %-100 %] 95 % (10/16 1018)  PHYSICAL EXAM:  General: Alert, cooperative, no distress, appears stated age.  Pale Head: Normocephalic, without obvious abnormality, atraumatic. Eyes: Conjunctivae clear, anicteric sclerae. Pupils are equal ENT Nares normal. No drainage or sinus tenderness. Lips, mucosa, and tongue normal. No Thrush Neck: Surgical scar over the front of the neck back of the neck is very clean no erythema, discharge or tenderness sutures present no carotid bruit and no JVD. Back: No CVA tenderness. Lungs: Clear to auscultation bilaterally. No Wheezing or Rhonchi. No rales. Heart: Regular rate and rhythm, no murmur, rub or gallop. Abdomen: Soft, minimal tenderness Bowel sounds normal. No masses Extremities: atraumatic, no cyanosis. No edema. No clubbing Skin: No rashes or lesions. Or bruising Neurologic: Grossly non-focal  Lab Results Recent Labs    07/20/19 1025 07/20/19 1305 07/20/19 1854  07/21/19 0602  WBC 26.5*  --   --  19.3*  HGB 9.6*  --   --  8.4*  HCT 30.2*  --   --  27.1*  NA 133* 133*  --  137  K 6.0* 5.8* 4.7 4.6  CL 95* 104  --  107  CO2 21* 24  --  23  BUN 46* 41*  --  27*  CREATININE 2.10* 1.82*  --  1.06*   Liver Panel Recent Labs    07/20/19 1025 07/21/19 0602  PROT 6.1* 5.3*  ALBUMIN 3.3* 2.6*  AST 32 20  ALT 15 9  ALKPHOS 100 77  BILITOT 0.5 0.7   Sedimentation Rate No results for input(s): ESRSEDRATE in the last 72 hours. C-Reactive Protein No results for input(s): CRP in the last 72 hours.  Microbiology:  Studies/Results: Ct Abdomen Pelvis Wo Contrast  Result Date: 07/20/2019 CLINICAL DATA:  Abdominal pain EXAM: CT ABDOMEN AND PELVIS WITHOUT CONTRAST TECHNIQUE: Multidetector CT imaging of the abdomen and pelvis was performed following the standard protocol without IV contrast. COMPARISON:  04/18/2019 FINDINGS: Lower chest: Small left lower lobe nodule posteriorly measures 3 mm, stable since prior study. No acute abnormality. Hepatobiliary: Small gallstone layering within the gallbladder. No focal hepatic abnormality or biliary ductal dilatation. Pancreas: No focal abnormality or ductal dilatation. Spleen: No focal abnormality.  Normal size. Adrenals/Urinary Tract: No adrenal abnormality. No focal renal abnormality. No stones or hydronephrosis. Urinary bladder is unremarkable. Stomach/Bowel: There is diffuse wall thickening throughout the  colon. Mild stranding adjacent to the colon at the splenic flexure and in the descending colon. Findings most compatible with colitis. No evidence of bowel obstruction. Stomach and small bowel grossly unremarkable. Vascular/Lymphatic: Aortic atherosclerosis. No enlarged abdominal or pelvic lymph nodes. Reproductive: Uterus and adnexa unremarkable.  No mass. Other: No free fluid or free air. Musculoskeletal: No acute bony abnormality. Postoperative changes in the lumbar spine IMPRESSION: Wall thickening throughout  much of the colon compatible with pancolitis. Slight stranding/inflammation from the splenic flexure through the descending colon. Cholelithiasis. Aortic atherosclerosis. Electronically Signed   By: Rolm Baptise M.D.   On: 07/20/2019 11:50   Dg Abd 1 View  Result Date: 07/21/2019 CLINICAL DATA:  Colitis. EXAM: ABDOMEN - 1 VIEW COMPARISON:  CT 07/20/2019. FINDINGS: No bowel distention. Stool in the colon. No free air. Prior lumbosacral spine fusion. Hardware intact. Degenerative change lumbar spine and both hips. No acute bony abnormality. IMPRESSION: No acute abnormality identified.  No bowel distention.  No free air. Electronically Signed   By: [place]   On: 07/21/2019 09:01   Dg Chest Port 1 View  Result Date: 07/20/2019 CLINICAL DATA:  Pt arrival via EMS from home due to abdominal pain. Pt complaining of nausea, diarrhea, and abdominal pain that started last night and worsened this morning. Pt had back/neck surgery on 10/05. Nonsmoker. Hx of HTN. EXAM: PORTABLE CHEST 1 VIEW COMPARISON:  None. FINDINGS: Heart size is normal. Shallow lung inflation. Minimal LEFT lung atelectasis. No pulmonary edema. Previous cervicothoracic fusion. IMPRESSION: 1. Shallow inflation. 2. No focal acute pulmonary abnormality. Electronically Signed   By: Nolon Nations M.D.   On: 07/20/2019 10:50      Assessment/Plan: 68 year old female presented with acute onset of diarrhea, abdominal pain, fever and nausea.  She had cervical vertebra surgery on 07/10/2019 for which she received 1 dose of vancomycin and 2 doses of ciprofloxacin for surgical prophylaxis.  She was also on Bactrim for the last 5 days for a presumed UTI.  Acute onset diarrhea, with abdominal pain and fever and CT scan showing pan colitis- Cdiff colitis suspected clinically and by history.  The test came back negative. We will continue IV metronidazole for any other colitis.  We will also reduce vancomycin from 300 mg to 125 mg as she has  responded to the treatment.  Other causes of diarrhea and colitis could be another bacterial infection like Salmonella, E. coli, etc. but does not have the epidemiological risk. If the diarrhea was a manifestation of a systemic illness like staph aureus or Streptococcus, the cultures have been negative so far and the surgical site  looks absolutely fine .  She does not have any pneumonia.   X-ray of the abdomen does not show any distention or IBS.  Colon filled with stools. She did not have any constipation prior to this diarrhea to suggest overflow diarrhea.   AKI has resolved  Acute urinary retention- oxybutynin can be contributing to it as well-discontinued it.  Urine culture negative.  She currently has a Foley catheter   HTN- on amlodipine  Discussed the management with the patient and her daughter. Discussed with Dr.Yarbrough and hospitalist. ID will follow her peripherally this weekend.

## 2019-07-22 DIAGNOSIS — D509 Iron deficiency anemia, unspecified: Secondary | ICD-10-CM

## 2019-07-22 LAB — PREPARE RBC (CROSSMATCH)

## 2019-07-22 LAB — BASIC METABOLIC PANEL
Anion gap: 6 (ref 5–15)
BUN: 16 mg/dL (ref 8–23)
CO2: 21 mmol/L — ABNORMAL LOW (ref 22–32)
Calcium: 8 mg/dL — ABNORMAL LOW (ref 8.9–10.3)
Chloride: 111 mmol/L (ref 98–111)
Creatinine, Ser: 0.87 mg/dL (ref 0.44–1.00)
GFR calc Af Amer: >60 mL/min (ref >60)
GFR calc non Af Amer: >60 mL/min (ref >60)
Glucose, Bld: 89 mg/dL (ref 70–99)
Potassium: 4.8 mmol/L (ref 3.5–5.1)
Sodium: 138 mmol/L (ref 135–145)

## 2019-07-22 LAB — CBC
HCT: 22 % — ABNORMAL LOW (ref 36.0–46.0)
Hemoglobin: 6.7 g/dL — ABNORMAL LOW (ref 12.0–15.0)
MCH: 28.4 pg (ref 26.0–34.0)
MCHC: 30.5 g/dL (ref 30.0–36.0)
MCV: 93.2 fL (ref 80.0–100.0)
Platelets: 247 10*3/uL (ref 150–400)
RBC: 2.36 MIL/uL — ABNORMAL LOW (ref 3.87–5.11)
RDW: 14.6 % (ref 11.5–15.5)
WBC: 12.2 10*3/uL — ABNORMAL HIGH (ref 4.0–10.5)
nRBC: 0 % (ref 0.0–0.2)

## 2019-07-22 LAB — ABO/RH: ABO/RH(D): O POS

## 2019-07-22 LAB — HEMOGLOBIN AND HEMATOCRIT, BLOOD
HCT: 25.2 % — ABNORMAL LOW (ref 36.0–46.0)
Hemoglobin: 7.8 g/dL — ABNORMAL LOW (ref 12.0–15.0)

## 2019-07-22 LAB — FERRITIN: Ferritin: 88 ng/mL (ref 11–307)

## 2019-07-22 LAB — RETICULOCYTES
Immature Retic Fract: 14.6 % (ref 2.3–15.9)
RBC.: 2.26 MIL/uL — ABNORMAL LOW (ref 3.87–5.11)
Retic Count, Absolute: 39.1 10*3/uL (ref 19.0–186.0)
Retic Ct Pct: 1.7 % (ref 0.4–3.1)

## 2019-07-22 LAB — IRON AND TIBC
Iron: 11 ug/dL — ABNORMAL LOW (ref 28–170)
Saturation Ratios: 6 % — ABNORMAL LOW (ref 10.4–31.8)
TIBC: 186 ug/dL — ABNORMAL LOW (ref 250–450)
UIBC: 175 ug/dL

## 2019-07-22 LAB — FOLATE: Folate: 30 ng/mL (ref >5.9)

## 2019-07-22 LAB — VITAMIN B12: Vitamin B-12: 276 pg/mL (ref 180–914)

## 2019-07-22 MED ORDER — ALIGN 4 MG PO CAPS: 4.0000 mg | ORAL_CAPSULE | Freq: Every day | ORAL | 0 refills | Status: DC

## 2019-07-22 MED ORDER — METRONIDAZOLE 500 MG PO TABS: 500.0000 mg | ORAL_TABLET | Freq: Three times a day (TID) | ORAL | 0 refills | Status: AC

## 2019-07-22 MED ORDER — SODIUM CHLORIDE 0.9 % IV SOLN
510.0000 mg | Freq: Once | INTRAVENOUS | Status: AC
  Administered 2019-07-22: 510 mg via INTRAVENOUS
  Filled 2019-07-22: qty 17

## 2019-07-22 MED ORDER — SODIUM CHLORIDE 0.9% IV SOLUTION
Freq: Once | INTRAVENOUS | Status: AC
  Administered 2019-07-22: 12:00:00 via INTRAVENOUS

## 2019-07-22 MED ORDER — VANCOMYCIN 50 MG/ML ORAL SOLUTION: 125.0000 mg | Freq: Four times a day (QID) | ORAL | 0 refills | Status: AC

## 2019-07-22 NOTE — Progress Notes (Signed)
Lisa Good and O x4. VSS. Pt tolerating diet well. No complaints of nausea or vomiting. IV removed intact, prescriptions given. Pt voices understanding of discharge instructions with no further questions. Patient discharged via wheelchair with RN  Allergies as of 07/22/2019      Reactions   Contrast Media [iodinated Diagnostic Agents] Hives   Cephalexin Nausea Only   nausea      Medication List    STOP taking these medications   celecoxib 200 MG capsule Commonly known as: CELEBREX   methylPREDNISolone 4 MG Tbpk tablet Commonly known as: MEDROL DOSEPAK   oxybutynin 10 MG 24 hr tablet Commonly known as: DITROPAN-XL     TAKE these medications   Align 4 MG Caps Take 1 capsule (4 mg total) by mouth daily.   Alpha-Lipoic Acid 100 MG Tabs Take 300 mg by mouth daily.   amLODipine 10 MG tablet Commonly known as: NORVASC Take 10 mg by mouth daily.   atorvastatin 40 MG tablet Commonly known as: LIPITOR Take 40 mg by mouth daily.   fluticasone 50 MCG/ACT nasal spray Commonly known as: FLONASE Place 2 sprays into both nostrils daily.   gabapentin 800 MG tablet Commonly known as: NEURONTIN Take 1 tablet (800 mg total) by mouth 4 (four) times daily.   lisinopril 40 MG tablet Commonly known as: ZESTRIL Take 40 mg by mouth daily.   methocarbamol 500 MG tablet Commonly known as: ROBAXIN Take 1,000 mg by mouth every 6 (six) hours. Start 07/28/19   metroNIDAZOLE 500 MG tablet Commonly known as: Flagyl Take 1 tablet (500 mg total) by mouth 3 (three) times daily for 7 days.   naloxone 4 MG/0.1ML Liqd nasal spray kit Commonly known as: NARCAN Place 4 mg into the nose once.   oxyCODONE-acetaminophen 10-325 MG tablet Commonly known as: Percocet Take 1 tablet by mouth every 6 (six) hours as needed for pain. Must last 30 days.   pantoprazole 40 MG tablet Commonly known as: PROTONIX Take 40 mg by mouth daily.   tamsulosin 0.4 MG Caps capsule Commonly known as:  FLOMAX Take 0.4 mg by mouth daily. Take 30 minutes after same meal each day   triamcinolone cream 0.1 % Commonly known as: KENALOG Apply 1 application topically 3 (three) times daily.   vancomycin 50 mg/mL  oral solution Commonly known as: VANCOCIN Take 2.5 mLs (125 mg total) by mouth 4 (four) times daily for 7 days.   venlafaxine XR 150 MG 24 hr capsule Commonly known as: EFFEXOR-XR Take 225 mg by mouth daily with breakfast.   venlafaxine XR 75 MG 24 hr capsule Commonly known as: EFFEXOR-XR Take 75 mg by mouth daily. Take along with 150 mg capsule for total 225 mg once daily       Vitals:   07/22/19 1213 07/22/19 1248  BP: 137/60 128/61  Pulse: 100 (!) 101  Resp: 18 16  Temp: 98.1 F (36.7 C) 98 F (36.7 C)  SpO2: 96% 95%    [name]

## 2019-07-22 NOTE — Discharge Summary (Signed)
[facility] [place] NAME: Lisa Good    MR#:  [id number]  DATE OF BIRTH:  09-Feb-1951  DATE OF ADMISSION:  07/20/2019 ADMITTING PHYSICIAN: Otila Back, MD  DATE OF DISCHARGE: 07/22/2019  PRIMARY CARE PHYSICIAN: [name], MD    ADMISSION DIAGNOSIS:  Dehydration [E86.0] Lactic acidosis [E87.2] Colitis [K52.9]  DISCHARGE DIAGNOSIS:  Active Problems:   Pancolitis ([place])   SECONDARY DIAGNOSIS:   Past Medical History:  Diagnosis Date  . Allergy    seasonal  . Arthritis   . Depression   . GERD (gastroesophageal reflux disease)   . Hyperlipidemia   . Hypertension   . Neuromuscular disorder ([place])    peripheral neuropathy/feet  . Sleep apnea     HOSPITAL COURSE:   68 year old female with history of hypertension and recent extensive anterior and posterior cervical reconstruction for cervical myelopathy who presented she the emergency room due to diarrhea, nausea and vomiting.   1.  Sepsis: Patient presented with increased white blood cell count and tachycardia.  Sepsis was due to pancolitis.  C. difficile testing is negative.    ID is recommending to continue patient on Flagyl and vancomycin for 8 more days despite C diff negative PCR the suspicion was elevated for C diff.   2.  Acute kidney injury due to profound dehydration and diarrhea: This has improved with IV fluids  3.  Hyponatremia from diarrhea which is improved with IV fluids  4.  Chronic pain: She will continue her outpatient medications.She is followed at the pain clinic. 5.  Essential hypertension: Continue Norvasc and lisinopril  6.  Hyperlipidemia: Continue statin 7.  Acute blood loss anemia: Drop in hemoglobin is predominantly dilutional in addition on further testing she also has iron deficiency anemia.  She received 1 unit PRBC.  She also received IV iron.  She will be referred to hematologist as an outpatient.  DISCHARGE CONDITIONS AND DIET:    Stable for discharge regular diet  CONSULTS OBTAINED:    DRUG ALLERGIES:   Allergies  Allergen Reactions  . Contrast Media [Iodinated Diagnostic Agents] Hives  . Cephalexin Nausea Only    nausea    DISCHARGE MEDICATIONS:   Allergies as of 07/22/2019      Reactions   Contrast Media [iodinated Diagnostic Agents] Hives   Cephalexin Nausea Only   nausea      Medication List    STOP taking these medications   celecoxib 200 MG capsule Commonly known as: CELEBREX   methylPREDNISolone 4 MG Tbpk tablet Commonly known as: MEDROL DOSEPAK   oxybutynin 10 MG 24 hr tablet Commonly known as: DITROPAN-XL     TAKE these medications   Align 4 MG Caps Take 1 capsule (4 mg total) by mouth daily.   Alpha-Lipoic Acid 100 MG Tabs Take 300 mg by mouth daily.   amLODipine 10 MG tablet Commonly known as: NORVASC Take 10 mg by mouth daily.   atorvastatin 40 MG tablet Commonly known as: LIPITOR Take 40 mg by mouth daily.   fluticasone 50 MCG/ACT nasal spray Commonly known as: FLONASE Place 2 sprays into both nostrils daily.   gabapentin 800 MG tablet Commonly known as: NEURONTIN Take 1 tablet (800 mg total) by mouth 4 (four) times daily.   lisinopril 40 MG tablet Commonly known as: ZESTRIL Take 40 mg by mouth daily.   methocarbamol 500 MG tablet Commonly known as: ROBAXIN Take 1,000 mg by mouth every 6 (six) hours. Start 07/28/19  metroNIDAZOLE 500 MG tablet Commonly known as: Flagyl Take 1 tablet (500 mg total) by mouth 3 (three) times daily for 7 days.   naloxone 4 MG/0.1ML Liqd nasal spray kit Commonly known as: NARCAN Place 4 mg into the nose once.   oxyCODONE-acetaminophen 10-325 MG tablet Commonly known as: Percocet Take 1 tablet by mouth every 6 (six) hours as needed for pain. Must last 30 days.   pantoprazole 40 MG tablet Commonly known as: PROTONIX Take 40 mg by mouth daily.   tamsulosin 0.4 MG Caps capsule Commonly known as: FLOMAX Take 0.4 mg  by mouth daily. Take 30 minutes after same meal each day   triamcinolone cream 0.1 % Commonly known as: KENALOG Apply 1 application topically 3 (three) times daily.   vancomycin 50 mg/mL  oral solution Commonly known as: VANCOCIN Take 2.5 mLs (125 mg total) by mouth 4 (four) times daily for 7 days.   venlafaxine XR 150 MG 24 hr capsule Commonly known as: EFFEXOR-XR Take 225 mg by mouth daily with breakfast.   venlafaxine XR 75 MG 24 hr capsule Commonly known as: EFFEXOR-XR Take 75 mg by mouth daily. Take along with 150 mg capsule for total 225 mg once daily         Today   CHIEF COMPLAINT:  No acute issues overnight.  Patient's diarrhea has improved.   VITAL SIGNS:  Blood pressure 122/61, pulse (!) 104, temperature 98.4 F (36.9 C), temperature source Oral, resp. rate 17, height 5' 4" (1.626 m), weight 83.9 kg, SpO2 93 %.   REVIEW OF SYSTEMS:  Review of Systems  Constitutional: Negative.  Negative for chills, fever and malaise/fatigue.  HENT: Negative.  Negative for ear discharge, ear pain, hearing loss, nosebleeds and sore throat.   Eyes: Negative.  Negative for blurred vision and pain.  Respiratory: Negative.  Negative for cough, hemoptysis, shortness of breath and wheezing.   Cardiovascular: Negative.  Negative for chest pain, palpitations and leg swelling.  Gastrointestinal: Negative.  Negative for abdominal pain, blood in stool, diarrhea, nausea and vomiting.  Genitourinary: Negative.  Negative for dysuria.  Musculoskeletal: Positive for back pain (Chronic).  Skin: Negative.   Neurological: Negative for dizziness, tremors, speech change, focal weakness, seizures and headaches.  Endo/Heme/Allergies: Negative.  Does not bruise/bleed easily.  Psychiatric/Behavioral: Negative.  Negative for depression, hallucinations and suicidal ideas.     PHYSICAL EXAMINATION:  GENERAL:  68 y.o.-year-old patient lying in the bed with no acute distress.  NECK:  Supple, no  jugular venous distention. No thyroid enlargement, no tenderness.  LUNGS: Normal breath sounds bilaterally, no wheezing, rales,rhonchi  No use of accessory muscles of respiration.  CARDIOVASCULAR: S1, S2 normal. No murmurs, rubs, or gallops.  ABDOMEN: Soft, non-tender, non-distended. Bowel sounds present. No organomegaly or mass.  EXTREMITIES: No pedal edema, cyanosis, or clubbing.  PSYCHIATRIC: The patient is alert and oriented x 3.  SKIN: No obvious rash, lesion, or ulcer.   DATA REVIEW:   CBC Recent Labs  Lab 07/22/19 0503  WBC 12.2*  HGB 6.7*  HCT 22.0*  PLT 247    Chemistries  Recent Labs  Lab 07/21/19 0602 07/22/19 0503  NA 137 138  K 4.6 4.8  CL 107 111  CO2 23 21*  GLUCOSE 95 89  BUN 27* 16  CREATININE 1.06* 0.87  CALCIUM 7.6* 8.0*  MG 2.1  --   AST 20  --   ALT 9  --   ALKPHOS 77  --   BILITOT 0.7  --  Cardiac Enzymes No results for input(s): TROPONINI in the last 168 hours.  Microbiology Results  _0 @  RADIOLOGY:  Ct Abdomen Pelvis Wo Contrast  Result Date: 07/20/2019 CLINICAL DATA:  Abdominal pain EXAM: CT ABDOMEN AND PELVIS WITHOUT CONTRAST TECHNIQUE: Multidetector CT imaging of the abdomen and pelvis was performed following the standard protocol without IV contrast. COMPARISON:  04/18/2019 FINDINGS: Lower chest: Small left lower lobe nodule posteriorly measures 3 mm, stable since prior study. No acute abnormality. Hepatobiliary: Small gallstone layering within the gallbladder. No focal hepatic abnormality or biliary ductal dilatation. Pancreas: No focal abnormality or ductal dilatation. Spleen: No focal abnormality.  Normal size. Adrenals/Urinary Tract: No adrenal abnormality. No focal renal abnormality. No stones or hydronephrosis. Urinary bladder is unremarkable. Stomach/Bowel: There is diffuse wall thickening throughout the colon. Mild stranding adjacent to the colon at the splenic flexure and in the descending colon. Findings most  compatible with colitis. No evidence of bowel obstruction. Stomach and small bowel grossly unremarkable. Vascular/Lymphatic: Aortic atherosclerosis. No enlarged abdominal or pelvic lymph nodes. Reproductive: Uterus and adnexa unremarkable.  No mass. Other: No free fluid or free air. Musculoskeletal: No acute bony abnormality. Postoperative changes in the lumbar spine IMPRESSION: Wall thickening throughout much of the colon compatible with pancolitis. Slight stranding/inflammation from the splenic flexure through the descending colon. Cholelithiasis. Aortic atherosclerosis. Electronically Signed   By: Rolm Baptise M.D.   On: 07/20/2019 11:50   Dg Abd 1 View  Result Date: 07/21/2019 CLINICAL DATA:  Colitis. EXAM: ABDOMEN - 1 VIEW COMPARISON:  CT 07/20/2019. FINDINGS: No bowel distention. Stool in the colon. No free air. Prior lumbosacral spine fusion. Hardware intact. Degenerative change lumbar spine and both hips. No acute bony abnormality. IMPRESSION: No acute abnormality identified.  No bowel distention.  No free air. Electronically Signed   By: [place]   On: 07/21/2019 09:01   Dg Chest Port 1 View  Result Date: 07/20/2019 CLINICAL DATA:  Pt arrival via EMS from home due to abdominal pain. Pt complaining of nausea, diarrhea, and abdominal pain that started last night and worsened this morning. Pt had back/neck surgery on 10/05. Nonsmoker. Hx of HTN. EXAM: PORTABLE CHEST 1 VIEW COMPARISON:  None. FINDINGS: Heart size is normal. Shallow lung inflation. Minimal LEFT lung atelectasis. No pulmonary edema. Previous cervicothoracic fusion. IMPRESSION: 1. Shallow inflation. 2. No focal acute pulmonary abnormality. Electronically Signed   By: Nolon Nations M.D.   On: 07/20/2019 10:50      Allergies as of 07/22/2019      Reactions   Contrast Media [iodinated Diagnostic Agents] Hives   Cephalexin Nausea Only   nausea      Medication List    STOP taking these medications   celecoxib 200 MG  capsule Commonly known as: CELEBREX   methylPREDNISolone 4 MG Tbpk tablet Commonly known as: MEDROL DOSEPAK   oxybutynin 10 MG 24 hr tablet Commonly known as: DITROPAN-XL     TAKE these medications   Align 4 MG Caps Take 1 capsule (4 mg total) by mouth daily.   Alpha-Lipoic Acid 100 MG Tabs Take 300 mg by mouth daily.   amLODipine 10 MG tablet Commonly known as: NORVASC Take 10 mg by mouth daily.   atorvastatin 40 MG tablet Commonly known as: LIPITOR Take 40 mg by mouth daily.   fluticasone 50 MCG/ACT nasal spray Commonly known as: FLONASE Place 2 sprays into both nostrils daily.   gabapentin 800 MG tablet Commonly known as: NEURONTIN Take 1 tablet (800 mg total)  by mouth 4 (four) times daily.   lisinopril 40 MG tablet Commonly known as: ZESTRIL Take 40 mg by mouth daily.   methocarbamol 500 MG tablet Commonly known as: ROBAXIN Take 1,000 mg by mouth every 6 (six) hours. Start 07/28/19   metroNIDAZOLE 500 MG tablet Commonly known as: Flagyl Take 1 tablet (500 mg total) by mouth 3 (three) times daily for 7 days.   naloxone 4 MG/0.1ML Liqd nasal spray kit Commonly known as: NARCAN Place 4 mg into the nose once.   oxyCODONE-acetaminophen 10-325 MG tablet Commonly known as: Percocet Take 1 tablet by mouth every 6 (six) hours as needed for pain. Must last 30 days.   pantoprazole 40 MG tablet Commonly known as: PROTONIX Take 40 mg by mouth daily.   tamsulosin 0.4 MG Caps capsule Commonly known as: FLOMAX Take 0.4 mg by mouth daily. Take 30 minutes after same meal each day   triamcinolone cream 0.1 % Commonly known as: KENALOG Apply 1 application topically 3 (three) times daily.   vancomycin 50 mg/mL  oral solution Commonly known as: VANCOCIN Take 2.5 mLs (125 mg total) by mouth 4 (four) times daily for 7 days.   venlafaxine XR 150 MG 24 hr capsule Commonly known as: EFFEXOR-XR Take 225 mg by mouth daily with breakfast.   venlafaxine XR 75 MG 24 hr  capsule Commonly known as: EFFEXOR-XR Take 75 mg by mouth daily. Take along with 150 mg capsule for total 225 mg once daily          Management plans discussed with the patient and she is in agreement. Stable for discharge home  Patient should follow up with pcp  CODE STATUS:     Code Status Orders  (From admission, onward)         Start     Ordered   07/20/19 1347  Full code  Continuous     07/20/19 1348        Code Status History    This patient has a current code status but no historical code status.   Advance Care Planning Activity      TOTAL TIME TAKING CARE OF THIS PATIENT: 38 minutes.    Note: This dictation was prepared with Dragon dictation along with smaller phrase technology. Any transcriptional errors that result from this process are unintentional.  Bettey Costa M.D on 07/22/2019 at 9:40 AM  Between 7am to 6pm - Pager - [phone] After 6pm go to [email or web address] - password [email or web address] [facility]  Office  [phone]  CC: Primary care physician; [name], MD

## 2019-07-22 NOTE — Progress Notes (Signed)
Dr Benjie Karvonen informed of post blood Hgb and hct and approved of the patient being discharged

## 2019-07-22 NOTE — Progress Notes (Signed)
Lisa Antigua, MD [address], [place], [place], [place], [address] [address], [place], [place], [place], [address] Phone: [phone]  Fax: [phone]   Subjective: Diarrhea and abdominal pain continues to improve.  Patient reports 1 bowel movement today that was more formed than before.  No blood in stool.   Objective: Exam: Vital signs in last 24 hours: Vitals:   07/22/19 0823 07/22/19 0927 07/22/19 1005 07/22/19 1213  BP: (!) 135/56 122/61 (!) 124/56 137/60  Pulse: 91 (!) 104 95 100  Resp: 16 17 16 18   Temp: 98.3 F (36.8 C) 98.4 F (36.9 C) 98 F (36.7 C) 98.1 F (36.7 C)  TempSrc: Oral Oral Oral Oral  SpO2: 96% 93% 95% 96%  Weight:      Height:       Weight change:   Intake/Output Summary (Last 24 hours) at 07/22/2019 1229 Last data filed at 07/22/2019 1200 Gross per 24 hour  Intake 2330.66 ml  Output 2125 ml  Net 205.66 ml    General: No acute distress, AAO x3 Abd: Soft, NT/ND, No HSM Skin: Warm, no rashes Neck: Supple, Trachea midline   Lab Results: Lab Results  Component Value Date   WBC 12.2 (H) 07/22/2019   HGB 6.7 (L) 07/22/2019   HCT 22.0 (L) 07/22/2019   MCV 93.2 07/22/2019   PLT 247 07/22/2019   Micro Results: Recent Results (from the past 240 hour(s))  Blood Culture (routine x 2)     Status: None (Preliminary result)   Collection Time: 07/20/19 10:25 AM   Specimen: BLOOD  Result Value Ref Range Status   Specimen Description BLOOD LEFT ANTECUBITAL  Final   Special Requests   Final    BOTTLES DRAWN AEROBIC AND ANAEROBIC Blood Culture adequate volume   Culture   Final    NO GROWTH 2 DAYS Performed at [facility], [address] [place], [place] [address]    Report Status PENDING  Incomplete  Urine culture     Status: None   Collection Time: 07/20/19 10:25 AM   Specimen: In/Out Cath Urine  Result Value Ref Range Status   Specimen Description   Final    IN/OUT CATH URINE Performed at [facility], [address], [place], [place] [address]    Special Requests   Final    NONE Performed at [facility], [address] [place], [place] [address]    Culture   Final    NO GROWTH Performed at [facility], [place] [address] [place], [place] [address]    Report Status 07/21/2019 FINAL  Final  SARS CORONAVIRUS 2 (TAT 6-24 HRS) Nasopharyngeal Nasopharyngeal Swab     Status: None   Collection Time: 07/20/19  1:05 PM   Specimen: Nasopharyngeal Swab  Result Value Ref Range Status   SARS Coronavirus 2 NEGATIVE NEGATIVE Final    Comment: (NOTE) SARS-CoV-2 target nucleic acids are NOT DETECTED. The SARS-CoV-2 RNA is generally detectable in upper and lower respiratory specimens during the acute phase of infection. Negative results do not preclude SARS-CoV-2 infection, do not rule out co-infections with other pathogens, and should not be used as the sole basis for treatment or other patient management decisions. Negative results must be combined with clinical observations, patient history, and epidemiological information. The expected result is Negative. Fact Sheet for Patients: SugarRoll.be Fact Sheet for Healthcare Providers: https://www.woods-mathews.com/ This test is not yet approved or cleared by the [place] and  has been authorized  for detection and/or diagnosis of SARS-CoV-2 by FDA under an Emergency Use Authorization (EUA). This EUA will remain  in effect (meaning this test can be used) for the duration of the COVID-19 declaration under Section 56 4(b)(1) of the Act, 21 U.S.C. section 360bbb-3(b)(1), unless the authorization is terminated or revoked sooner. Performed at [facility], [place] [address], [place], [place] [address]   Blood culture (single)     Status: None (Preliminary result)   Collection Time: 07/20/19  6:39 PM   Specimen: BLOOD  Result Value Ref Range Status   Specimen Description  BLOOD RIGHT ANTECUBITAL  Final   Special Requests   Final    BOTTLES DRAWN AEROBIC AND ANAEROBIC Blood Culture adequate volume   Culture   Final    NO GROWTH 2 DAYS Performed at [facility], [address], [place], [place] [address]    Report Status PENDING  Incomplete  C difficile quick scan w PCR reflex     Status: None   Collection Time: 07/20/19 11:24 PM   Specimen: STOOL  Result Value Ref Range Status   C Diff antigen NEGATIVE NEGATIVE Final   C Diff toxin NEGATIVE NEGATIVE Final   C Diff interpretation No C. difficile detected.  Final    Comment: Performed at [facility], [address] [place], [place] [address]   Studies/Results: Dg Abd 1 View  Result Date: 07/21/2019 CLINICAL DATA:  Colitis. EXAM: ABDOMEN - 1 VIEW COMPARISON:  CT 07/20/2019. FINDINGS: No bowel distention. Stool in the colon. No free air. Prior lumbosacral spine fusion. Hardware intact. Degenerative change lumbar spine and both hips. No acute bony abnormality. IMPRESSION: No acute abnormality identified.  No bowel distention.  No free air. Electronically Signed   By: Marcello Moores  Register   On: 07/21/2019 09:01   Medications:  Scheduled Meds: . amLODipine  10 mg Oral QPM  . atorvastatin  40 mg Oral QPM  . enoxaparin (LOVENOX) injection  40 mg Subcutaneous Q24H  . gabapentin  800 mg Oral QID  . lisinopril  40 mg Oral Daily  . methocarbamol  1,000 mg Oral QID  . tamsulosin  0.4 mg Oral QPC supper  . vancomycin  125 mg Oral QID  . venlafaxine XR  225 mg Oral Q breakfast   Continuous Infusions: . metronidazole Stopped (07/22/19 0526)   PRN Meds:.celecoxib, fluticasone, HYDROmorphone (DILAUDID) injection, labetalol, oxyCODONE-acetaminophen **AND** oxyCODONE   Assessment: Active Problems:   Pancolitis ([facility])    Plan: GI panel still pending Symptoms improving with antibiotics as per infectious disease Pancolitis likely due to infectious causes Patient had a drop in hemoglobin  without any signs of active GI bleeding  Would recommend full anemia work-up, and evaluating for sources of bleeding.  Obtain UA to rule out hematuria etc.  Would not recommend endoscopic procedures in the setting of active pancolitis, no evidence of active GI bleeding.  However, if active GI bleeding occurs, appropriateness for endoscopy can be evaluated at that time  Patient receiving blood transfusion and iron replacement   LOS: 2 days   Lisa Antigua, MD 07/22/2019, 12:29 PM

## 2019-07-23 LAB — TYPE AND SCREEN
ABO/RH(D): O POS
Antibody Screen: NEGATIVE
Unit division: 0

## 2019-07-23 LAB — BPAM RBC
Blood Product Expiration Date: 202011182359
ISSUE DATE / TIME: 202010170936
Unit Type and Rh: 5100

## 2019-07-25 LAB — CULTURE, BLOOD (ROUTINE X 2)
Culture: NO GROWTH
Special Requests: ADEQUATE

## 2019-07-25 LAB — CULTURE, BLOOD (SINGLE)
Culture: NO GROWTH
Special Requests: ADEQUATE

## 2019-07-25 LAB — GI PATHOGEN PANEL BY PCR, STOOL

## 2019-08-02 ENCOUNTER — Encounter: Payer: Self-pay | Admitting: Student in an Organized Health Care Education/Training Program

## 2019-08-02 ENCOUNTER — Other Ambulatory Visit: Payer: Self-pay

## 2019-08-02 ENCOUNTER — Ambulatory Visit
Payer: Medicare Other | Attending: Student in an Organized Health Care Education/Training Program | Admitting: Student in an Organized Health Care Education/Training Program

## 2019-08-02 DIAGNOSIS — G894 Chronic pain syndrome: Secondary | ICD-10-CM

## 2019-08-02 DIAGNOSIS — M47812 Spondylosis without myelopathy or radiculopathy, cervical region: Secondary | ICD-10-CM | POA: Diagnosis not present

## 2019-08-02 DIAGNOSIS — M4802 Spinal stenosis, cervical region: Secondary | ICD-10-CM

## 2019-08-02 DIAGNOSIS — Z9889 Other specified postprocedural states: Secondary | ICD-10-CM

## 2019-08-02 DIAGNOSIS — M961 Postlaminectomy syndrome, not elsewhere classified: Secondary | ICD-10-CM

## 2019-08-02 DIAGNOSIS — M48062 Spinal stenosis, lumbar region with neurogenic claudication: Secondary | ICD-10-CM

## 2019-08-02 DIAGNOSIS — Z981 Arthrodesis status: Secondary | ICD-10-CM

## 2019-08-02 MED ORDER — OXYCODONE-ACETAMINOPHEN 10-325 MG PO TABS: 1.0000 | ORAL_TABLET | Freq: Four times a day (QID) | ORAL | 0 refills | Status: DC | PRN

## 2019-08-02 NOTE — Progress Notes (Signed)
Pain Management Virtual Encounter Note - Virtual Visit via [place] (real-time audio visits between healthcare provider and patient).   Patient's Phone No. & Preferred Pharmacy:  [phone] (home); [phone] (mobile); (Preferred) [phone] [email or web address]  [name] [address]Phone: [phone]: [phone]   Pre-screening note:  Our staff contacted Ms. DCarrolltonand offered her an "in person", "face-to-face" appointment versus a telephone encounter. She indicated preferring the telephone encounter, at this time.   Reason for Virtual Visit: COVID-19*  Social distancing based on CDC and AMA recommendations.   I contacted Earlyn Holben on 08/02/2019 via video conference.      I clearly identified myself as BGillis Santa MD. I verified that I was speaking with the correct person using two identifiers (Name: SKinze Labo and date of birth: 1February 22, 1952.  Advanced Informed Consent I sought verbal [facility] consent [facility]. I informed Ms. Delia of possible security and privacy concerns, risks, and limitations associated with providing "not-in-person" medical evaluation and management services. I also informed Ms. Shepard of the availability of "in-person" appointments. Finally, I informed her that there would be a charge for the virtual visit and that she could be  personally, fully or partially, financially responsible for it. Ms. DAntillaexpressed understanding and agreed to proceed.   Historic Elements   Ms. SAnihya Tumais a 68y.o. year old, female patient evaluated today after her last encounter by our practice on 06/08/2019. Ms. DReffitt has a past medical history of Allergy, Arthritis, Depression, GERD (gastroesophageal reflux disease), Hyperlipidemia, Hypertension, Neuromuscular disorder ([place], and Sleep apnea. She also  has a past  surgical history that includes Spine surgery; Joint replacement (Right); Tubal ligation; Brain surgery (2002); and Extracorporeal shock wave lithotripsy (Right, 04/27/2019). Ms. DGrivashas a current medication list which includes the following prescription(s): alpha-lipoic acid, amlodipine, atorvastatin, fluticasone, gabapentin, lisinopril, naloxone, oxycodone-acetaminophen, pantoprazole, align, venlafaxine xr, and venlafaxine xr. She  reports that she has never smoked. She has never used smokeless tobacco. She reports that she does not drink alcohol or use drugs. Ms. DFischleris allergic to contrast media [iodinated diagnostic agents] and cephalexin.   HPI  Today, she is being contacted for medication management.  Patient is status post C4-C5 corpectomy with C6-C7 ACDF and C2-C3 posterior spinal fusion performed on 07/10/2019 with [name]  Overall she is doing well.  She is endorsing paresthesias in her hands.  These are brought on suddenly without any inciting event.  She is utilizing her pain medication as prescribed.  We discussed pain plan at this point I would like for her to continue her Percocet as prescribed for at least the next month.  If her acute postoperative pain has significantly improved by her appointment next month, I think it would be reasonable for her to wean off her Percocet and restart her Belbuca which was providing her with long-acting pain control.  With Belbuca being a mixed opioid agonist/antagonist, I do not recommend introducing this medication at this time as the antagonistic effects could theoretically worsen the patient's pain in the context of her acute postoperative pain.  Patient endorsed understanding.  Pharmacotherapy Assessment  Analgesic  07/07/2019  4   06/07/2019  Oxycodone-Acetaminophen 10-325  120.00  30 Bi Lat   [id number]  Nor ([id number])   0  60.00 MME  Medicare   [place]    Monitoring: Pharmacotherapy: No side-effects or adverse reactions reported. [place]  PMP: PDMP  reviewed during this encounter.       Compliance: No problems identified. Effectiveness: Clinically acceptable. Plan: Refer to "POC".  UDS: No results found for: SUMMARY Laboratory Chemistry Profile (12 mo)  Renal: 07/22/2019: BUN 16; Creatinine, Ser 0.87  Lab Results  Component Value Date   GFRAA >60 07/22/2019   GFRNONAA >60 07/22/2019   Hepatic: 07/21/2019: Albumin 2.6 Lab Results  Component Value Date   AST 20 07/21/2019   ALT 9 07/21/2019   Other: 07/22/2019: Vitamin B-12 276 Note: Above Lab results reviewed.  Imaging  Last 90 days:  Ct Abdomen Pelvis Wo Contrast  Result Date: 07/20/2019 CLINICAL DATA:  Abdominal pain EXAM: CT ABDOMEN AND PELVIS WITHOUT CONTRAST TECHNIQUE: Multidetector CT imaging of the abdomen and pelvis was performed following the standard protocol without IV contrast. COMPARISON:  04/18/2019 FINDINGS: Lower chest: Small left lower lobe nodule posteriorly measures 3 mm, stable since prior study. No acute abnormality. Hepatobiliary: Small gallstone layering within the gallbladder. No focal hepatic abnormality or biliary ductal dilatation. Pancreas: No focal abnormality or ductal dilatation. Spleen: No focal abnormality.  Normal size. Adrenals/Urinary Tract: No adrenal abnormality. No focal renal abnormality. No stones or hydronephrosis. Urinary bladder is unremarkable. Stomach/Bowel: There is diffuse wall thickening throughout the colon. Mild stranding adjacent to the colon at the splenic flexure and in the descending colon. Findings most compatible with colitis. No evidence of bowel obstruction. Stomach and small bowel grossly unremarkable. Vascular/Lymphatic: Aortic atherosclerosis. No enlarged abdominal or pelvic lymph nodes. Reproductive: Uterus and adnexa unremarkable.  No mass. Other: No free fluid or free air. Musculoskeletal: No acute bony abnormality. Postoperative changes in the lumbar spine IMPRESSION: Wall thickening throughout much of the colon  compatible with pancolitis. Slight stranding/inflammation from the splenic flexure through the descending colon. Cholelithiasis. Aortic atherosclerosis. Electronically Signed   By: Rolm Baptise M.D.   On: 07/20/2019 11:50   Dg Abd 1 View  Result Date: 07/21/2019 CLINICAL DATA:  Colitis. EXAM: ABDOMEN - 1 VIEW COMPARISON:  CT 07/20/2019. FINDINGS: No bowel distention. Stool in the colon. No free air. Prior lumbosacral spine fusion. Hardware intact. Degenerative change lumbar spine and both hips. No acute bony abnormality. IMPRESSION: No acute abnormality identified.  No bowel distention.  No free air. Electronically Signed   By: Marcello Moores  Register   On: 07/21/2019 09:01   Abdomen 1 View (kub)  Result Date: 05/15/2019 CLINICAL DATA:  Right ureteral stone. EXAM: ABDOMEN - 1 VIEW COMPARISON:  04/27/2019. FINDINGS: Large amount of stool noted throughout the colon and postoperative changes of the lumbar spine make evaluation for stone disease difficult. Previously identified right upper ureteral stone is not definitely identified on today's exam. Follow-up CT can be obtained as needed. No bowel distention. IMPRESSION: Large amount of stool noted throughout the colon and postoperative changes of the lumbar spine make evaluation for stone disease difficult. Previously identified right upper ureteral stone is not definitely identified on today's exam. To confirm stone passage CT can be obtained as needed. Electronically Signed   By: Marcello Moores  Register   On: 05/15/2019 14:59   Dg Chest Port 1 View  Result Date: 07/20/2019 CLINICAL DATA:  Pt arrival via EMS from home due to abdominal pain. Pt complaining of nausea, diarrhea, and abdominal pain that started last night and worsened this morning. Pt had back/neck surgery on 10/05. Nonsmoker. Hx of HTN. EXAM: PORTABLE CHEST 1 VIEW COMPARISON:  None. FINDINGS: Heart size is normal. Shallow lung inflation. Minimal LEFT lung atelectasis. No pulmonary  edema. Previous  cervicothoracic fusion. IMPRESSION: 1. Shallow inflation. 2. No focal acute pulmonary abnormality. Electronically Signed   By: Nolon Nations M.D.   On: 07/20/2019 10:50    Assessment  The primary encounter diagnosis was Hx of cervical spine surgery. Diagnoses of Cervical stenosis of spinal canal, Foraminal stenosis of cervical region, Cervical spondylosis without myelopathy, Postlaminectomy syndrome of lumbosacral region, History of fusion of lumbar spine, Spinal stenosis of lumbar region with neurogenic claudication, and Chronic pain syndrome were also pertinent to this visit.  Plan of Care  I have discontinued Sheronda Lenz's tamsulosin and triamcinolone cream. I am also having her maintain [name], amLODipine, atorvastatin, fluticasone, lisinopril, venlafaxine XR, gabapentin, pantoprazole, venlafaxine XR, naloxone, Align, and oxyCODONE-acetaminophen.  Pharmacotherapy (Medications Ordered): Meds ordered this encounter  Medications  . oxyCODONE-acetaminophen (PERCOCET) 10-325 MG tablet    Sig: Take 1 tablet by mouth every 6 (six) hours as needed for pain. Must last 30 days.    Dispense:  120 tablet    Refill:  0    Chronic Pain. (STOP Act - Not applicable). Fill one day early if closed on scheduled refill date.   Follow-up in 1 month.  Depending upon how the patient's acute on chronic pain is doing, can consider restarting her back on belbuca and weaning her off  Percocet.  Follow-up plan:   Return in about 4 weeks (around 08/30/2019) for Medication Management, virtual.     Recent Visits Date Type Provider Dept  06/08/19 Office Visit Gillis Santa, MD [facility]  06/07/19 Office Visit Gillis Santa, MD [facility]  Showing recent visits within past 90 days and meeting all other requirements   Today's Visits Date Type Provider Dept  08/02/19 Office Visit Gillis Santa, MD [facility]  Showing today's visits and meeting all other  requirements   Future Appointments No visits were found meeting these conditions.  Showing future appointments within next 90 days and meeting all other requirements   I discussed the assessment and treatment plan with the patient. The patient was provided an opportunity to ask questions and all were answered. The patient agreed with the plan and demonstrated an understanding of the instructions.  Patient advised to call back or seek an in-person evaluation if the symptoms or condition worsens.  Total duration of non-face-to-face encounter: 25 minutes.  Note by: Gillis Santa, MD Date: 08/02/2019; Time: 4:10 PM  Note: This dictation was prepared with Dragon dictation. Any transcriptional errors that may result from this process are unintentional.  Disclaimer:  * Given the special circumstances of the COVID-19 pandemic, the federal government has announced that the Office for Civil Rights (OCR) will exercise its enforcement discretion and will not impose penalties on physicians using telehealth in the event of noncompliance with regulatory requirements under the [place] (HIPAA) in connection with the good faith provision of telehealth during the PNPYY-51 national public health emergency. ([place])

## 2019-08-07 ENCOUNTER — Ambulatory Visit: Payer: Medicare Other | Admitting: Urology

## 2019-08-16 ENCOUNTER — Other Ambulatory Visit: Payer: Self-pay

## 2019-08-17 ENCOUNTER — Ambulatory Visit: Payer: Medicare Other | Admitting: Gastroenterology

## 2019-08-21 ENCOUNTER — Encounter: Payer: Self-pay | Admitting: Student in an Organized Health Care Education/Training Program

## 2019-08-21 ENCOUNTER — Ambulatory Visit
Payer: Medicare Other | Attending: Student in an Organized Health Care Education/Training Program | Admitting: Student in an Organized Health Care Education/Training Program

## 2019-08-21 ENCOUNTER — Other Ambulatory Visit: Payer: Self-pay

## 2019-08-21 DIAGNOSIS — M48062 Spinal stenosis, lumbar region with neurogenic claudication: Secondary | ICD-10-CM

## 2019-08-21 DIAGNOSIS — Z981 Arthrodesis status: Secondary | ICD-10-CM

## 2019-08-21 DIAGNOSIS — M4802 Spinal stenosis, cervical region: Secondary | ICD-10-CM

## 2019-08-21 DIAGNOSIS — M47812 Spondylosis without myelopathy or radiculopathy, cervical region: Secondary | ICD-10-CM | POA: Diagnosis not present

## 2019-08-21 DIAGNOSIS — Z9889 Other specified postprocedural states: Secondary | ICD-10-CM | POA: Diagnosis not present

## 2019-08-21 DIAGNOSIS — M961 Postlaminectomy syndrome, not elsewhere classified: Secondary | ICD-10-CM | POA: Diagnosis not present

## 2019-08-21 DIAGNOSIS — G894 Chronic pain syndrome: Secondary | ICD-10-CM

## 2019-08-21 MED ORDER — NONFORMULARY OR COMPOUNDED ITEM: 2 refills | Status: DC

## 2019-08-21 MED ORDER — BELBUCA 600 MCG BU FILM: 1.0000 | ORAL_FILM | Freq: Two times a day (BID) | BUCCAL | 2 refills | Status: DC

## 2019-08-21 NOTE — Progress Notes (Signed)
Pain Management Virtual Encounter Note - Virtual Visit via [place] (real-time audio visits between healthcare provider and patient).   Patient's Phone No. & Preferred Pharmacy:  [phone] (home); [phone] (mobile); (Preferred) [phone] [email or web address]  [name]Phone: [phone]: [phone]   Pre-screening note:  Our staff contacted [name] offered her an "in person", "face-to-face" appointment versus a telephone encounter. She indicated preferring the telephone encounter, at this time.   Reason for Virtual Visit: COVID-19*  Social distancing based on CDC and AMA recommendations.   I contacted Lisa Good on 08/21/2019 via video conference.      I clearly identified myself as BGillis Santa MD. I verified that I was speaking with the correct person using two identifiers (Name: Lisa Good and date of birth: 1Aug 23, 1952.  Advanced Informed Consent I sought verbal advanced consent [facility]. I informed Lisa Good of possible security and privacy concerns, risks, and limitations associated with providing "not-in-person" medical evaluation and management services. I also informed Lisa Good of the availability of "in-person" appointments. Finally, I informed her that there would be a charge for the virtual visit and that she could be  personally, fully or partially, financially responsible for it. Lisa Good understanding and agreed to proceed.   Historic Elements   Lisa Good a 68y.o. year old, female patient evaluated today after her last encounter by our practice on 08/02/2019. Lisa Good has a past medical history of Allergy, Arthritis, Depression, GERD (gastroesophageal reflux disease), Hyperlipidemia, Hypertension, Neuromuscular disorder (Lisa Good, and Sleep apnea. She also  has a past  surgical history that includes Spine surgery; Joint replacement (Right); Tubal ligation; Brain surgery (2002); and Extracorporeal shock wave lithotripsy (Right, 04/27/2019). Ms. DMeckeshas a current medication list which includes the following prescription(s): alpha-lipoic acid, amlodipine, atorvastatin, celecoxib, gabapentin, lisinopril, naloxone, ondansetron, oxybutynin, pantoprazole, align, venlafaxine xr, venlafaxine xr, belbuca, and NONFORMULARY OR COMPOUNDED ITEM. She  reports that she has never smoked. She has never used smokeless tobacco. She reports that she does not drink alcohol or use drugs. Lisa Good allergic to contrast media [iodinated diagnostic agents] and cephalexin.   HPI  Today, she is being contacted for medication management.  Patient continues to recover from her cervical spine surgery.  She states that her pain is improving.  She continues to utilize oxycodone 10 mg every 4-6 hours as needed.  We discussed [facility]ing [facility] on before at a dose of 600 mcg twice daily.  This is a long-acting milder opioid analgesic which will hopefully provide her with improved pain relief.  I instructed the patient to inform me 6 to 8 weeks prior to her next surgery so we can wean her belbuca and transition her to a direct, short acting opioid analgesic.  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  She is on alpha lipoic acid, high-dose gabapentin, Effexor.  Will prescribe compounded cream as below that she can apply to her legs and feet.  Pharmacotherapy Assessment  Analgesic:  08/07/2019  4   08/02/2019  Oxycodone-Acetaminophen 10-325  120.00  30 Bi Lat   [id number]  Nor (2541)   0  60.00 MME  Medicare   Lisa Good  07/28/2019  4   07/28/2019  Oxycodone Hcl 5 MG Tablet  40.00  10 Am Fer   [id number]  Nor (26415  0  30.00 MME  Medicare   Lisa Good     Monitoring: Pharmacotherapy: No side-effects or adverse reactions reported. Lisa Good PMP: PDMP reviewed during this  encounter.       Compliance: No problems identified. Effectiveness: Clinically acceptable. Plan: Refer to "POC".  UDS: No results found for: SUMMARY Laboratory Chemistry Profile (12 mo)  Renal: 07/22/2019: BUN 16; Creatinine, Ser 0.87  Lab Results  Component Value Date   GFRAA >60 07/22/2019   GFRNONAA >60 07/22/2019   Hepatic: 07/21/2019: Albumin 2.6 Lab Results  Component Value Date   AST 20 07/21/2019   ALT 9 07/21/2019   Other: 07/22/2019: Vitamin Lisa-12 276 Note: Above Lab results reviewed.  Imaging  Last 90 days:  Ct Abdomen Pelvis Wo Contrast  Result Date: 07/20/2019 CLINICAL DATA:  Abdominal pain EXAM: CT ABDOMEN AND PELVIS WITHOUT CONTRAST TECHNIQUE: Multidetector CT imaging of the abdomen and pelvis was performed following the standard protocol without IV contrast. COMPARISON:  04/18/2019 FINDINGS: Lower chest: Small left lower lobe nodule posteriorly measures 3 mm, stable since prior study. No acute abnormality. Hepatobiliary: Small gallstone layering within the gallbladder. No focal hepatic abnormality or biliary ductal dilatation. Pancreas: No focal abnormality or ductal dilatation. Spleen: No focal abnormality.  Normal size. Adrenals/Urinary Tract: No adrenal abnormality. No focal renal abnormality. No stones or hydronephrosis. Urinary bladder is unremarkable. Stomach/Bowel: There is diffuse wall thickening throughout the colon. Mild stranding adjacent to the colon at the splenic flexure and in the descending colon. Findings most compatible with colitis. No evidence of bowel obstruction. Stomach and small bowel grossly unremarkable. Vascular/Lymphatic: Aortic atherosclerosis. No enlarged abdominal or pelvic lymph nodes. Reproductive: Uterus and adnexa unremarkable.  No mass. Other: No free fluid or free air. Musculoskeletal: No acute bony abnormality. Postoperative changes in the lumbar spine IMPRESSION: Wall thickening throughout much of the colon compatible with pancolitis.  Slight stranding/inflammation from the splenic flexure through the descending colon. Cholelithiasis. Aortic atherosclerosis. Electronically Signed   By: Rolm Baptise M.D.   On: 07/20/2019 11:50   Dg Abd 1 View  Result Date: 07/21/2019 CLINICAL DATA:  Colitis. EXAM: ABDOMEN - 1 VIEW COMPARISON:  CT 07/20/2019. FINDINGS: No bowel distention. Stool in the colon. No free air. Prior lumbosacral spine fusion. Hardware intact. Degenerative change lumbar spine and both hips. No acute bony abnormality. IMPRESSION: No acute abnormality identified.  No bowel distention.  No free air. Electronically Signed   By: [place]   On: 07/21/2019 09:01   Dg Chest Port 1 View  Result Date: 07/20/2019 CLINICAL DATA:  Pt arrival via EMS from home due to abdominal pain. Pt complaining of nausea, diarrhea, and abdominal pain that started last night and worsened this morning. Pt had back/neck surgery on 10/05. Nonsmoker. Hx of HTN. EXAM: PORTABLE CHEST 1 VIEW COMPARISON:  None. FINDINGS: Heart size is normal. Shallow lung inflation. Minimal LEFT lung atelectasis. No pulmonary edema. Previous cervicothoracic fusion. IMPRESSION: 1. Shallow inflation. 2. No focal acute pulmonary abnormality. Electronically Signed   By: Nolon Nations M.D.   On: 07/20/2019 10:50    Assessment  The primary encounter diagnosis was Hx of cervical spine surgery. Diagnoses of Cervical stenosis of spinal canal, Foraminal stenosis of cervical region, Cervical spondylosis without myelopathy, Postlaminectomy syndrome of lumbosacral region, History of fusion of lumbar spine, Spinal stenosis of lumbar region with neurogenic claudication, and Chronic pain syndrome were also pertinent to this visit.  Plan of Care  I have discontinued Aya Elgin's oxyCODONE-acetaminophen and oxyCODONE. I am also having  her start on Belbuca and NONFORMULARY OR COMPOUNDED ITEM. Additionally, I am having her maintain [name], amLODipine, atorvastatin,  lisinopril, venlafaxine XR, gabapentin, pantoprazole, venlafaxine XR, naloxone, Align, oxybutynin, ondansetron, and celecoxib.  1.  Start weaning oxycodone,  Only take as needed for significant pain flare, breakthrough pain. 2.  Start Belbuca as below which the patient was on previously prior to her cervical spine surgery at a dose of 600 mcg twice daily 3.  Compounded cream as below.   Pharmacotherapy (Medications Ordered): Meds ordered this encounter  Medications  . Buprenorphine HCl (BELBUCA) 600 MCG FILM    Sig: Place 1 Film inside cheek every 12 (twelve) hours.    Dispense:  60 each    Refill:  2  . NONFORMULARY OR COMPOUNDED ITEM    Sig: Sig: 1-2 ml to affected area 3-4 times/day.    Dispense:  1 each    Refill:  2    10% Ketamine/2% Cyclobenzaprine/6% Gabapentin Cream Dispense: 240 GM bottle   Orders:  No orders of the defined types were placed in this encounter.  Follow-up plan:   Return in about 3 months (around 11/21/2019) for Medication Management, virtual.    I discussed the assessment and treatment plan with the patient. The patient was provided an opportunity to ask questions and all were answered. The patient agreed with the plan and demonstrated an understanding of the instructions.  Patient advised to call back or seek an in-person evaluation if the symptoms or condition worsens.  Total duration of non-face-to-face encounter: 41mnutes.  Note by: BGillis Santa MD Date: 08/21/2019; Time: 3:06 PM  Note: This dictation was prepared with Dragon dictation. Any transcriptional errors that may result from this process are unintentional.  Disclaimer:  * Given the special circumstances of the COVID-19 pandemic, the federal government has announced that the Office for Civil Rights (OCR) will exercise its enforcement discretion and will not impose penalties on physicians using telehealth in the event of noncompliance with regulatory requirements under the [place](HIPAA) in connection with the good faith provision of telehealth during the [place]-37national public health emergency. ([place]

## 2019-08-25 ENCOUNTER — Other Ambulatory Visit: Payer: Self-pay | Admitting: Neurosurgery

## 2019-08-25 DIAGNOSIS — M419 Scoliosis, unspecified: Secondary | ICD-10-CM

## 2019-08-28 ENCOUNTER — Encounter: Payer: Self-pay | Admitting: Urology

## 2019-08-28 ENCOUNTER — Ambulatory Visit: Payer: Medicare Other | Admitting: Urology

## 2019-09-12 ENCOUNTER — Telehealth: Payer: Self-pay

## 2019-09-12 NOTE — Telephone Encounter (Signed)
[facility] has this available. I called the script in there. Patient notified.

## 2019-09-12 NOTE — Telephone Encounter (Signed)
[facility] Dr[facility] Holley Raring ordered for her[facility] [address] say [facility] dont [facility] it.

## 2019-09-18 ENCOUNTER — Ambulatory Visit: Payer: Medicare Other | Attending: Neurosurgery

## 2019-09-26 ENCOUNTER — Other Ambulatory Visit: Payer: Self-pay | Admitting: Student in an Organized Health Care Education/Training Program

## 2019-10-02 ENCOUNTER — Other Ambulatory Visit: Payer: Self-pay

## 2019-10-02 ENCOUNTER — Encounter: Payer: Self-pay | Admitting: Urology

## 2019-10-02 ENCOUNTER — Ambulatory Visit (INDEPENDENT_AMBULATORY_CARE_PROVIDER_SITE_OTHER): Payer: Medicare Other | Admitting: Urology

## 2019-10-02 VITALS — BP 190/85 | HR 102 | Ht 64.0 in | Wt 195.0 lb

## 2019-10-02 DIAGNOSIS — N3946 Mixed incontinence: Secondary | ICD-10-CM | POA: Diagnosis not present

## 2019-10-02 NOTE — Addendum Note (Signed)
Addended by: Verlene Mayer A on: 10/02/2019 03:32 PM   Modules accepted: Orders

## 2019-10-02 NOTE — Progress Notes (Signed)
10/02/2019 9:59 AM   Lisa Good [birth year] [id number]  Referring provider: [name], MD [place] [facility],  [place] [address]  Chief Complaint  Patient presents with  . Urinary Incontinence    HPI: I was consulted to assess the patient is urinary incontinence.  She has had 2 synthetic slings and one pubovaginal sling in [place].  She might leak with coughing sneezing with a hard cough but otherwise has no stress incontinence.  She is urge incontinence worse when she goes from a supine to standing position she can soak a pad.  She wears a 2 pads a day.  The positional changes significant trigger.  No bedwetting  She voids every 3-4 hours gets up twice at night  She recently had neck surgery and needs another back operation.  She has had 2 bladder infections in the last 6 months.  By history she recently failed Ditropan and Myrbetriq  She has a history of kidney stones.  She has not had a hysterectomy  Modifying factors: There are no other modifying factors  Associated signs and symptoms: There are no other associated signs and symptoms Aggravating and relieving factors: There are no other aggravating or relieving factors Severity: Moderate Duration: Persistent   PMH: Past Medical History:  Diagnosis Date  . Allergy    seasonal  . Arthritis   . Depression   . GERD (gastroesophageal reflux disease)   . Hyperlipidemia   . Hypertension   . Neuromuscular disorder ([place])    peripheral neuropathy/feet  . Sleep apnea     Surgical History: Past Surgical History:  Procedure Laterality Date  . 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 EXTRACORPOREAL SHOCK WAVE LITHOTRIPSY Right 04/27/2019   Procedure: EXTRACORPOREAL SHOCK WAVE LITHOTRIPSY (ESWL);  Surgeon: Billey Co, MD;  Location: [facility];  Service: Urology;  Laterality: Right;  . JOINT REPLACEMENT Right    knee  . SPINE SURGERY     spinal fusion  . TUBAL LIGATION       Home Medications:  Allergies as of 10/02/2019      Reactions   Contrast Media [iodinated Diagnostic Agents] Hives   Cephalexin Nausea Only   nausea      Medication List       Accurate as of October 02, 2019  9:59 AM. If you have any questions, ask your nurse or doctor.        STOP taking these medications   oxybutynin 10 MG 24 hr tablet Commonly known as: DITROPAN-XL Stopped by: Reece Packer, MD     TAKE these medications   Align 4 MG Caps Take 1 capsule (4 mg total) by mouth daily.   Alpha-Lipoic Acid 100 MG Tabs Take 300 mg by mouth daily.   amLODipine 10 MG tablet Commonly known as: NORVASC Take 10 mg by mouth daily.   atorvastatin 40 MG tablet Commonly known as: LIPITOR Take 40 mg by mouth daily.   Belbuca 600 MCG Film Generic drug: Buprenorphine HCl Place 1 Film inside cheek every 12 (twelve) hours.   celecoxib 200 MG capsule Commonly known as: CELEBREX   ferrous sulfate 325 (65 FE) MG EC tablet Take 1 tablet by mouth every morning.   gabapentin 800 MG tablet Commonly known as: NEURONTIN Take 1 tablet (800 mg total) by mouth 4 (four) times daily.   lisinopril 40 MG tablet Commonly known as: ZESTRIL Take 40 mg by mouth daily.   naloxone 4 MG/0.1ML Liqd nasal  spray kit Commonly known as: NARCAN Place 4 mg into the nose once.   NONFORMULARY OR COMPOUNDED ITEM Sig: 1-2 ml to affected area 3-4 times/day.   ondansetron 4 MG disintegrating tablet Commonly known as: ZOFRAN-ODT   pantoprazole 40 MG tablet Commonly known as: PROTONIX Take 40 mg by mouth daily.   venlafaxine XR 150 MG 24 hr capsule Commonly known as: EFFEXOR-XR Take 225 mg by mouth daily with breakfast.   venlafaxine XR 75 MG 24 hr capsule Commonly known as: EFFEXOR-XR Take 75 mg by mouth daily. Take along with 150 mg capsule for total 225 mg once daily       Allergies:  Allergies  Allergen Reactions  . Contrast Media [Iodinated Diagnostic Agents] Hives  .  Cephalexin Nausea Only    nausea    Family History: No family history on file.  Social History:  reports that she has never smoked. She has never used smokeless tobacco. She reports that she does not drink alcohol or use drugs.  ROS: UROLOGY Frequent Urination?: Yes Hard to postpone urination?: No Burning/pain with urination?: No Get up at night to urinate?: No Leakage of urine?: No Urine stream starts and stops?: No Trouble starting stream?: No Do you have to strain to urinate?: No Blood in urine?: No Urinary tract infection?: No Sexually transmitted disease?: No Injury to kidneys or bladder?: No Painful intercourse?: No Weak stream?: No Currently pregnant?: No Vaginal bleeding?: No Last menstrual period?: N  Gastrointestinal Nausea?: No Vomiting?: No Indigestion/heartburn?: No Diarrhea?: No Constipation?: No  Constitutional Fever: No Night sweats?: No Weight loss?: No Fatigue?: No  Skin Skin rash/lesions?: No Itching?: No  Eyes Blurred vision?: No Double vision?: No  Ears/Nose/Throat Sore throat?: No Sinus problems?: No  Hematologic/Lymphatic Swollen glands?: No Easy bruising?: No  Cardiovascular Leg swelling?: No Chest pain?: No  Respiratory Cough?: No Shortness of breath?: No  Endocrine Excessive thirst?: No  Musculoskeletal Back pain?: No Joint pain?: No  Neurological Headaches?: No Dizziness?: No  Psychologic Depression?: No Anxiety?: No  Physical Exam: BP (!) 190/85   Pulse (!) 102   Ht 5' 4"  (1.626 m)   Wt 88.5 kg   BMI 33.47 kg/m   Constitutional:  Alert and oriented, No acute distress. HEENT: [place] AT, moist mucus membranes.  Trachea midline, no masses. Cardiovascular: No clubbing, cyanosis, or edema. Respiratory: Normal respiratory effort, no increased work of breathing. GI: Abdomen is soft, nontender, nondistended, no abdominal masses GU: Very well supported bladder neck and negative cough test.  Patient had about a  5 cm vaginal length.  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  I felt some scarring near the cuff.  She had no stress incontinence Skin: No rashes, bruises or suspicious lesions. Lymph: No cervical or inguinal adenopathy. Neurologic: Grossly intact, no focal deficits, moving all 4 extremities. Psychiatric: Normal mood and affect.  Laboratory Data: Lab Results  Component Value Date   WBC 12.2 (H) 07/22/2019   HGB 7.8 (L) 07/22/2019   HCT 25.2 (L) 07/22/2019   MCV 93.2 07/22/2019   PLT 247 07/22/2019    Lab Results  Component Value Date   CREATININE 0.87 07/22/2019    No results found for: PSA  No results found for: TESTOSTERONE  No results found for: HGBA1C  Urinalysis    Component Value Date/Time   COLORURINE YELLOW (A) 07/20/2019 1025   APPEARANCEUR HAZY (A) 07/20/2019 1025   APPEARANCEUR Cloudy (A) 04/20/2019 1010   LABSPEC 1.020 07/20/2019 1025  PHURINE 5.0 07/20/2019 1025   GLUCOSEU NEGATIVE 07/20/2019 1025   [place] 07/20/2019 [place] 07/20/2019 1025   BILIRUBINUR Negative 04/20/2019 [place] 07/20/2019 1025   PROTEINUR NEGATIVE 07/20/2019 1025   NITRITE NEGATIVE 07/20/2019 1025   LEUKOCYTESUR TRACE (A) 07/20/2019 1025    Pertinent Imaging:   Assessment & Plan: Likely patient likely has primarily an overactive bladder with triggering and possibly mild stress incontinence.  The role of urodynamics and cystoscopy discussed.  She has had a recent bladder infections.  There are no diagnoses linked to this encounter.  No follow-ups on file.  Reece Packer, MD  [facility] [address] [place], [place] [address] [phone]

## 2019-10-08 ENCOUNTER — Other Ambulatory Visit: Payer: Self-pay | Admitting: Student in an Organized Health Care Education/Training Program

## 2019-10-12 ENCOUNTER — Ambulatory Visit
Admission: RE | Admit: 2019-10-12 | Discharge: 2019-10-12 | Disposition: A | Discharge status: Home / Self Care | Payer: Medicare Other | Source: Ambulatory Visit | Attending: Neurosurgery | Admitting: Neurosurgery

## 2019-10-12 DIAGNOSIS — M419 Scoliosis, unspecified: Secondary | ICD-10-CM

## 2019-10-12 IMAGING — CR DG SCOLIOSIS EVAL COMPLETE SPINE 2-3V
1 series · 10 of 10 positions shown · non-contrast
Comparison: None.

CLINICAL DATA: Kyphoscoliosis

EXAM:
DG SCOLIOSIS EVAL COMPLETE SPINE 2-3V

[Series 1: dg scoliosis eval complete spine 2 or 3  · 0.14mm/px · 10 of 10 slices shown]
[im 1/10]
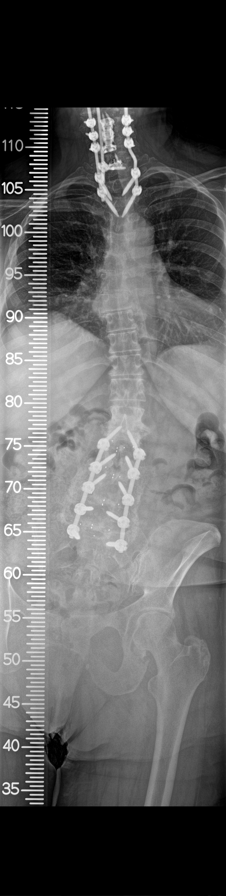
[im 2/10]
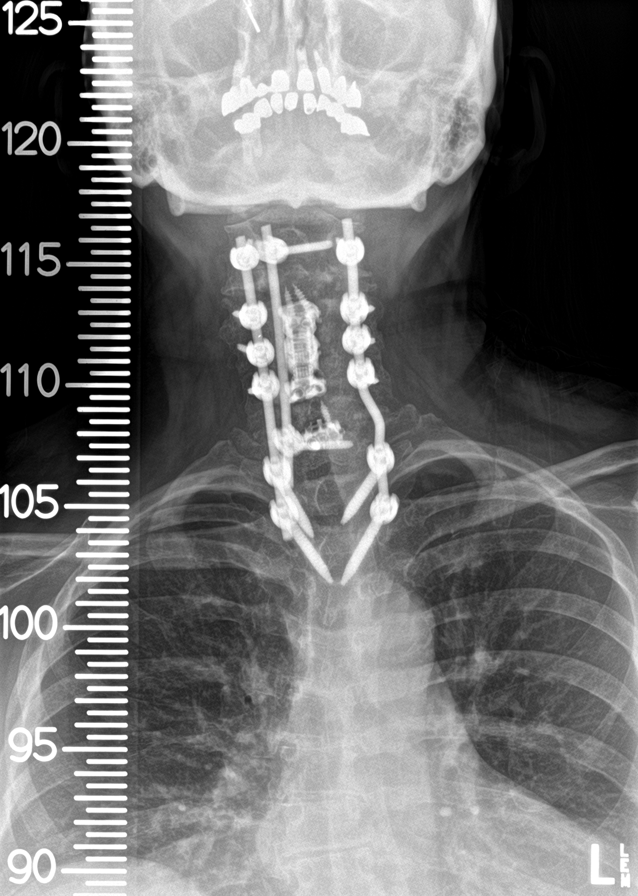
[im 3/10]
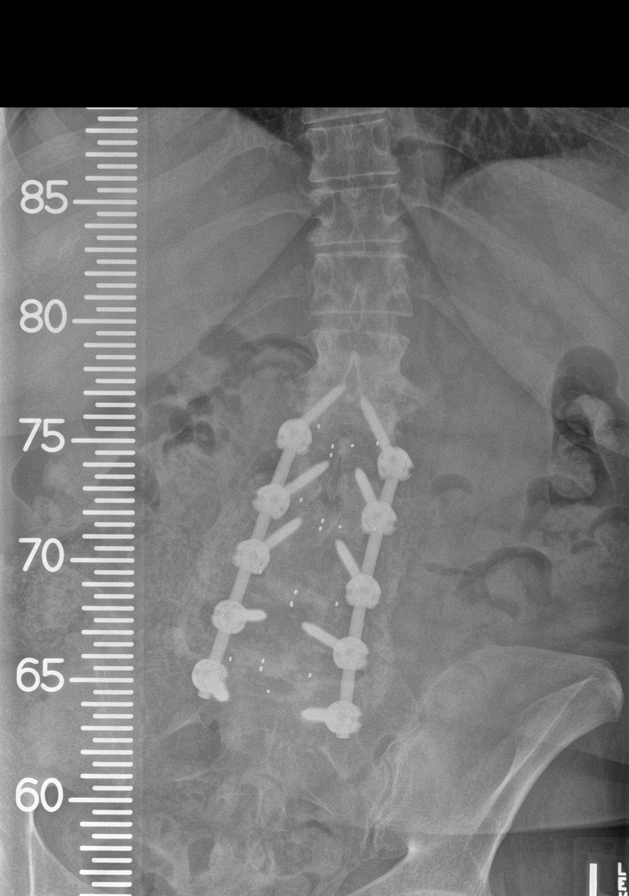
[im 4/10]
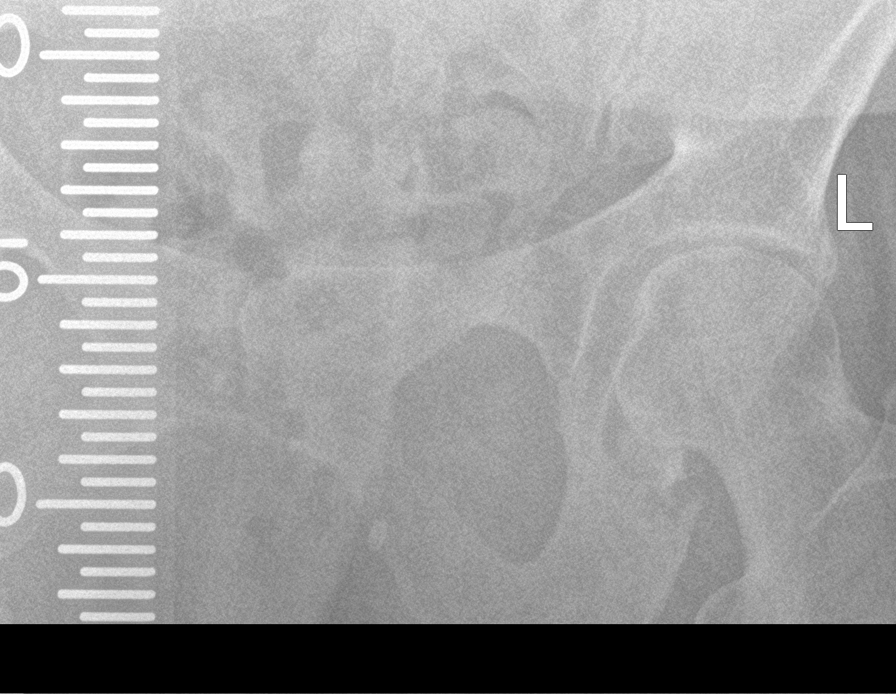
[im 5/10]
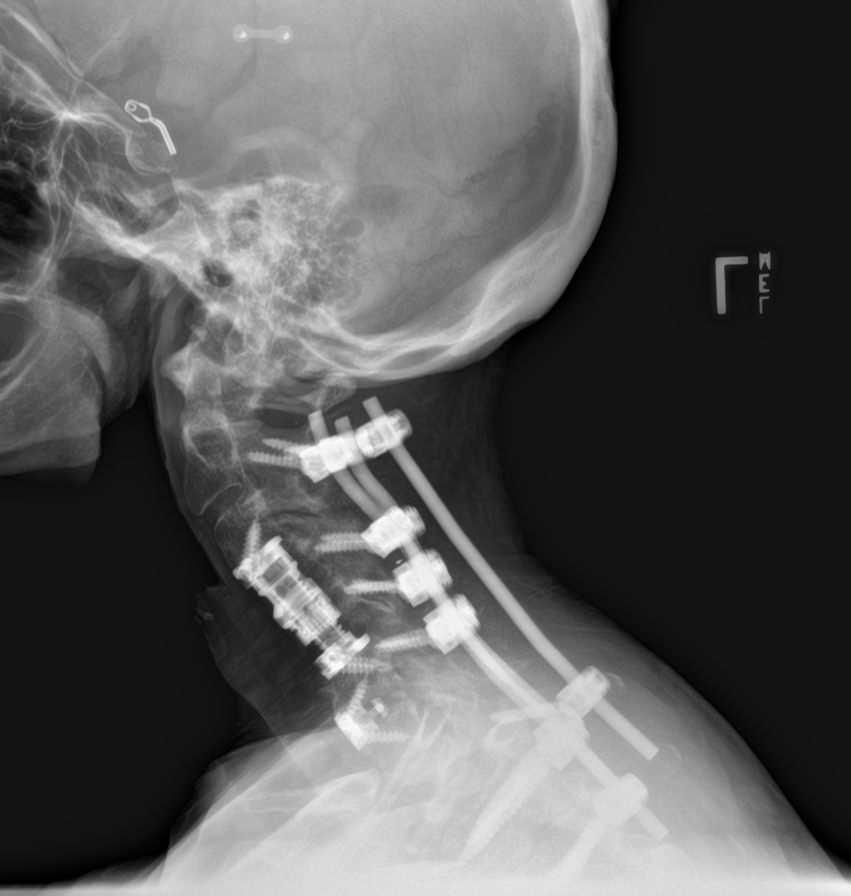
[im 6/10]
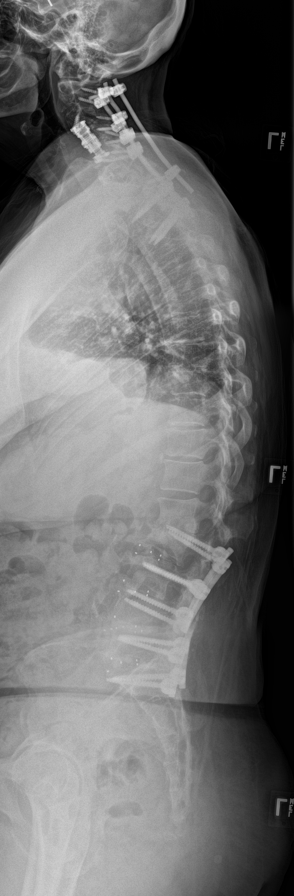
[im 7/10]
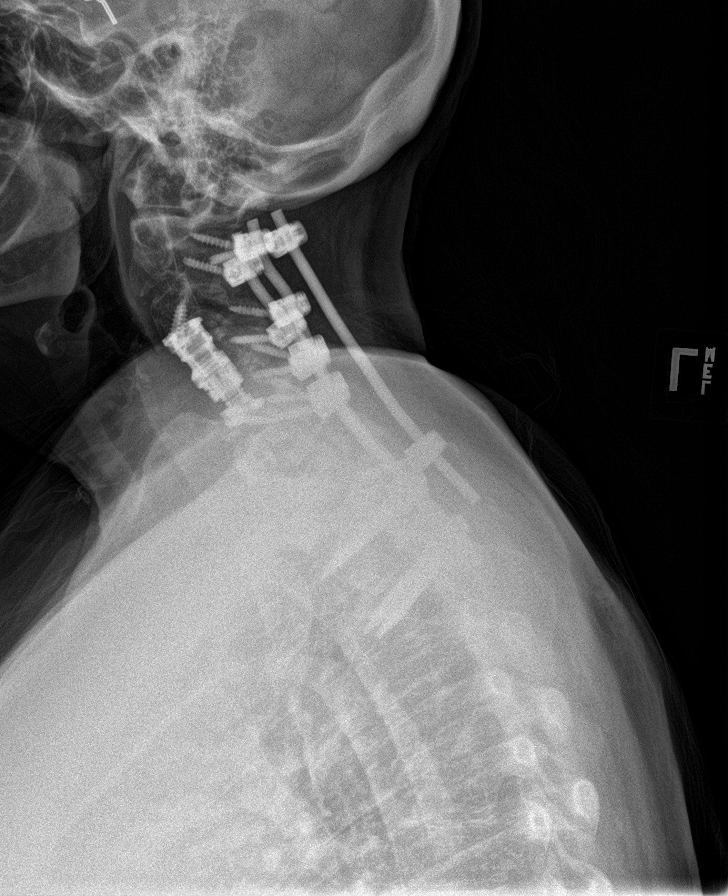
[im 8/10]
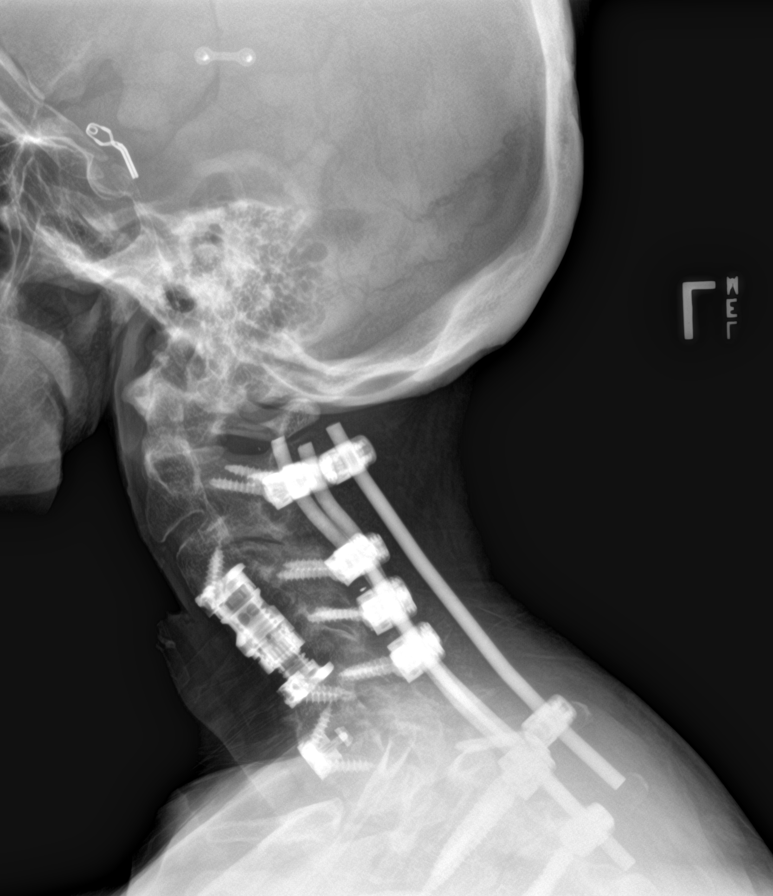
[im 9/10]
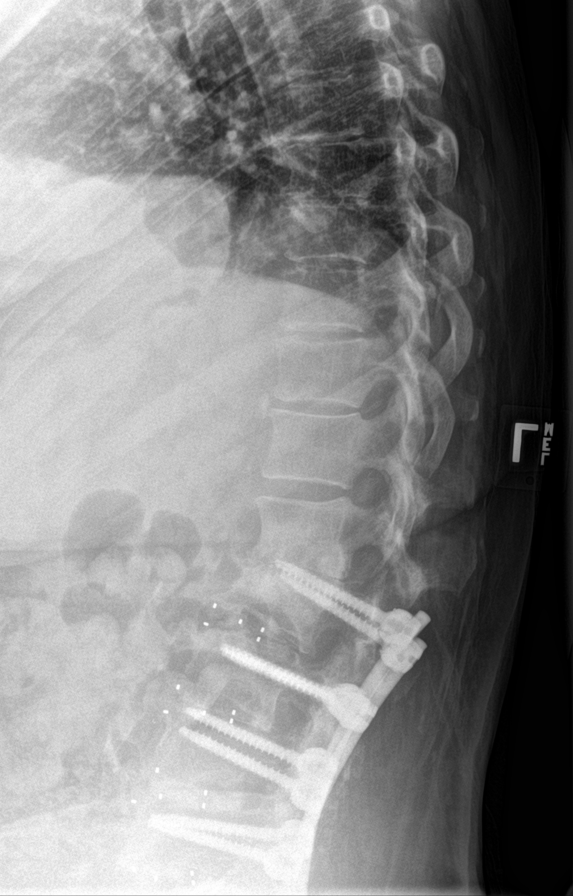
[im 10/10]
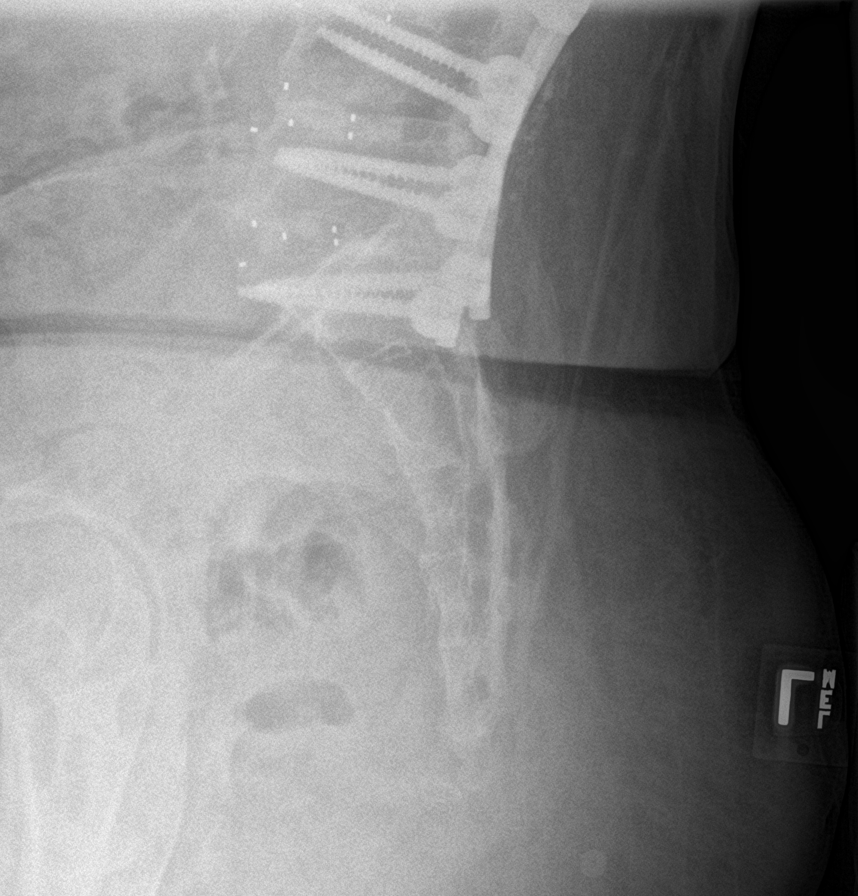

[10 of 10 positions shown; findings below may reference images not displayed]

FINDINGS: Posterior fusion from C2 into the upper thoracic spine. Prior
corpectomy C4 and C5 with anterior fusion C3-C6 and at C6-7.
Posterior fusion changes from L2-S1. Mild compression deformity
through the superior endplate of L2 with associated kyphosis.
Advanced degenerative disc disease at L1-2. Approximately 17 degrees
leftward scoliosis centered at L1. Approximately 15 degrees
rightward scoliosis in the midthoracic spine. No acute bony
abnormality.
IMPRESSION: Postoperative and degenerative changes as above.

Mild to moderate S-shaped thoracolumbar scoliosis with WOO angles
measured above.

## 2019-10-14 ENCOUNTER — Encounter: Payer: Self-pay | Admitting: Student in an Organized Health Care Education/Training Program

## 2019-10-17 ENCOUNTER — Other Ambulatory Visit: Payer: Self-pay | Admitting: Student in an Organized Health Care Education/Training Program

## 2019-10-17 DIAGNOSIS — M1712 Unilateral primary osteoarthritis, left knee: Secondary | ICD-10-CM

## 2019-10-17 DIAGNOSIS — M19011 Primary osteoarthritis, right shoulder: Secondary | ICD-10-CM

## 2019-10-17 NOTE — Progress Notes (Signed)
Please call patient to schedule.

## 2019-10-17 NOTE — Progress Notes (Unsigned)
Orders Placed This Encounter  Procedures  . Steroid Knee Blk (Schedule)    Local Anesthetic & Steroid injection.    Standing Status:   Future    Standing Expiration Date:   11/16/2019    Scheduling Instructions:     Side: left     Sedation: none     Timeframe: As soon as schedule allows    Order Specific Question:   Where will this procedure be performed?    Answer:   [facility]  . Shoulder injection (Schedule)    Standing Status:   Future    Standing Expiration Date:   11/16/2019    Scheduling Instructions:     Side: RIGHT     Sedation: without     Timeframe: As soon as schedule allows    Order Specific Question:   Where will this procedure be performed?    Answer:   [facility]    Comments:   Lisa Good   1. Primary osteoarthritis of left knee - Steroid Knee Blk (Schedule); Future  2. Primary osteoarthritis of right shoulder - Shoulder injection (Schedule); Future  Please call and schedule patient for above injections without sedation. Block 45 mins. Shoulder injection under fluoro

## 2019-10-18 NOTE — Progress Notes (Signed)
Scheduled patient for Wed 10-25-19 at 11:30 45 min procedure.

## 2019-10-19 ENCOUNTER — Other Ambulatory Visit: Payer: Self-pay

## 2019-10-19 ENCOUNTER — Encounter: Payer: Self-pay | Admitting: Gastroenterology

## 2019-10-19 ENCOUNTER — Telehealth: Payer: Self-pay

## 2019-10-19 ENCOUNTER — Ambulatory Visit (INDEPENDENT_AMBULATORY_CARE_PROVIDER_SITE_OTHER): Payer: Medicare Other | Admitting: Gastroenterology

## 2019-10-19 DIAGNOSIS — D509 Iron deficiency anemia, unspecified: Secondary | ICD-10-CM

## 2019-10-19 DIAGNOSIS — K5100 Ulcerative (chronic) pancolitis without complications: Secondary | ICD-10-CM | POA: Diagnosis not present

## 2019-10-19 DIAGNOSIS — K51919 Ulcerative colitis, unspecified with unspecified complications: Secondary | ICD-10-CM

## 2019-10-19 MED ORDER — NA SULFATE-K SULFATE-MG SULF 17.5-3.13-1.6 GM/177ML PO SOLN: 354.0000 mL | Freq: Once | ORAL | 0 refills | Status: AC

## 2019-10-19 NOTE — Telephone Encounter (Signed)
Called patient and scheduled patient for 11/10/2019. Went over instructions with patient mailed them and sent to [facility]. Sent prep to the pharmacy

## 2019-10-19 NOTE — Telephone Encounter (Signed)
colonoscopy for colitis and Iron def anemia. EGD for IDA. hematology referral for IDA. f/u 4-6 months. Called and left a message for call back to scheduled appointment. Did referral

## 2019-10-19 NOTE — Telephone Encounter (Signed)
Called and left a message for call back  

## 2019-10-19 NOTE — Progress Notes (Signed)
[name], MD [address]  [place]  [place], [place] [address]  Main: [phone]  Fax: [phone]   Primary Care Physician: [name], MD  Virtual Visit via Telephone Note  I connected with patient on 10/19/19 at 10:30 AM EST by telephone and verified that I am speaking with the correct person using two identifiers.   I discussed the limitations, risks, security and privacy concerns of performing an evaluation and management service by telephone and the availability of in person appointments. I also discussed with the patient that there may be a patient responsible charge related to this service. The patient expressed understanding and agreed to proceed.  Location of Patient: Home Location of Provider: Home Persons involved: Patient and provider only during the visit (nursing staff and front desk staff was involved in communicating with the patient prior to the appointment, reviewing medications and checking them in)  Video call was attempted but connection was unable to be made.  Converted to telephone visit.  History of Present Illness: Chief Complaint  Patient presents with  . New Patient (Initial Visit)  . Follow-up    Patient states she is feeling better no symptoms      HPI: Lisa Good is a 69 y.o. female being evaluated for post hospital follow-up of diarrhea and pancolitis.  Patient reports symptoms have completely resolved.  Is having 1 formed bowel movement a day.  No blood.  During hospitalization, patient had diarrhea and CT showed pancolitis.  GI panel and C. difficile was negative.  However, patient had received antibiotics right before the hospitalization and was treated for presumed C. difficile by infectious disease.  Reports history of a colonoscopy 3 to 4 years ago for routine screening and states it was normal.  No family history of colon cancer.  Was also found to have iron deficiency anemia on the hospital visit. Pt reports  having hemoglobin checked by primary care provider in November and value was 9.7.  Current Outpatient Medications  Medication Sig Dispense Refill  . Alpha-Lipoic Acid 100 MG TABS Take 300 mg by mouth daily.     Marland Kitchen amLODipine (NORVASC) 10 MG tablet Take 10 mg by mouth daily.    . ARIPiprazole (ABILIFY) 2 MG tablet     . atorvastatin (LIPITOR) 40 MG tablet Take 40 mg by mouth daily.    . Buprenorphine HCl (BELBUCA) 600 MCG FILM Place 1 Film inside cheek every 12 (twelve) hours. 60 each 2  . celecoxib (CELEBREX) 200 MG capsule     . gabapentin (NEURONTIN) 800 MG tablet Take 1 tablet (800 mg total) by mouth 4 (four) times daily. 120 tablet 3  . lisinopril (PRINIVIL,ZESTRIL) 40 MG tablet Take 40 mg by mouth daily.    . naloxone (NARCAN) nasal spray 4 mg/0.1 mL Place 4 mg into the nose once.     No current facility-administered medications for this visit.    Allergies as of 10/19/2019 - Review Complete 10/19/2019  Allergen Reaction Noted  . Contrast media [iodinated diagnostic agents] Hives 11/09/2018  . Cephalexin Nausea Only 01/28/2018    Review of Systems:    All systems reviewed and negative except where noted in HPI.   Observations/Objective:  Labs: CMP     Component Value Date/Time   NA 138 07/22/2019 0503   K 4.8 07/22/2019 0503   CL 111 07/22/2019 0503   CO2 21 (L) 07/22/2019 0503   GLUCOSE 89 07/22/2019 0503   BUN 16 07/22/2019 0503  CREATININE 0.87 07/22/2019 0503   CALCIUM 8.0 (L) 07/22/2019 0503   PROT 5.3 (L) 07/21/2019 0602   ALBUMIN 2.6 (L) 07/21/2019 0602   AST 20 07/21/2019 0602   ALT 9 07/21/2019 0602   ALKPHOS 77 07/21/2019 0602   BILITOT 0.7 07/21/2019 0602   GFRNONAA >60 07/22/2019 0503   GFRAA >60 07/22/2019 0503   Lab Results  Component Value Date   WBC 12.2 (H) 07/22/2019   HGB 7.8 (L) 07/22/2019   HCT 25.2 (L) 07/22/2019   MCV 93.2 07/22/2019   PLT 247 07/22/2019    Imaging Studies: DG SCOLIOSIS EVAL COMPLETE SPINE 2 OR 3 VIEWS  Result  Date: 10/12/2019 CLINICAL DATA:  Kyphoscoliosis EXAM: DG SCOLIOSIS EVAL COMPLETE SPINE 2-3V COMPARISON:  None. FINDINGS: Posterior fusion from C2 into the upper thoracic spine. Prior corpectomy C4 and C5 with anterior fusion C3-C6 and at C6-7. Posterior fusion changes from L2-S1. Mild compression deformity through the superior endplate of L2 with associated kyphosis. Advanced degenerative disc disease at L1-2. Approximately 17 degrees leftward scoliosis centered at L1. Approximately 15 degrees rightward scoliosis in the midthoracic spine. No acute bony abnormality. IMPRESSION: Postoperative and degenerative changes as above. Mild to moderate S-shaped thoracolumbar scoliosis with Cobb angles measured above. Electronically Signed   By: Rolm Baptise M.D.   On: 10/12/2019 09:33    Assessment and Plan:   Lisa Good is a 69 y.o. y/o female with recent hospitalization for diarrhea and pancolitis, also find to have iron deficiency anemia, here for follow-up  Assessment and Plan: Due to iron deficiency anemia and pancolitis noted on imaging, colonoscopy recommended for further evaluation Patient otherwise asymptomatic at this time Likely source of pancolitis was infectious diarrhea, but GI panel and C. difficile was negative.  However, patient did have antibiotic use prior to the hospitalization and was treated as presumed C. difficile during the hospitalization.  EGD also indicated for iron deficiency anemia  We will refer to hematology for IV iron transfusions, as patient not tolerating oral iron due to abdominal discomfort with it.    Follow Up Instructions: 2 to 3 months   I discussed the assessment and treatment plan with the patient. The patient was provided an opportunity to ask questions and all were answered. The patient agreed with the plan and demonstrated an understanding of the instructions.   The patient was advised to call back or seek an in-person evaluation if the symptoms worsen or  if the condition fails to improve as anticipated.  I provided 12 minutes of non-face-to-face time during this encounter. Additional time was spent in reviewing patient's chart, placing orders etc.   Virgel Manifold, MD  Speech recognition software was used to dictate this note.

## 2019-10-25 ENCOUNTER — Ambulatory Visit
Admission: RE | Admit: 2019-10-25 | Discharge: 2019-10-25 | Disposition: A | Discharge status: Home / Self Care | Payer: Medicare Other | Source: Ambulatory Visit | Attending: Student in an Organized Health Care Education/Training Program | Admitting: Student in an Organized Health Care Education/Training Program

## 2019-10-25 ENCOUNTER — Other Ambulatory Visit: Payer: Self-pay

## 2019-10-25 ENCOUNTER — Encounter: Payer: Self-pay | Admitting: Student in an Organized Health Care Education/Training Program

## 2019-10-25 ENCOUNTER — Ambulatory Visit (HOSPITAL_BASED_OUTPATIENT_CLINIC_OR_DEPARTMENT_OTHER): Payer: Medicare Other | Admitting: Student in an Organized Health Care Education/Training Program

## 2019-10-25 VITALS — BP 135/72 | HR 89 | Temp 98.1°F | Resp 18 | Ht 64.0 in | Wt 195.0 lb

## 2019-10-25 DIAGNOSIS — M19011 Primary osteoarthritis, right shoulder: Secondary | ICD-10-CM | POA: Diagnosis present

## 2019-10-25 DIAGNOSIS — M1712 Unilateral primary osteoarthritis, left knee: Secondary | ICD-10-CM

## 2019-10-25 MED ORDER — METHYLPREDNISOLONE ACETATE 40 MG/ML IJ SUSP
40.0000 mg | Freq: Once | INTRAMUSCULAR | Status: AC
  Administered 2019-10-25: 11:00:00 40 mg via INTRA_ARTICULAR
  Filled 2019-10-25: qty 1

## 2019-10-25 MED ORDER — ROPIVACAINE HCL 2 MG/ML IJ SOLN
9.0000 mL | Freq: Once | INTRAMUSCULAR | Status: AC
  Administered 2019-10-25: 9 mL via PERINEURAL
  Filled 2019-10-25: qty 10

## 2019-10-25 MED ORDER — LIDOCAINE HCL 2 % IJ SOLN
20.0000 mL | Freq: Once | INTRAMUSCULAR | Status: AC
  Administered 2019-10-25: 11:00:00 400 mg
  Filled 2019-10-25: qty 40

## 2019-10-25 MED ORDER — IOHEXOL 180 MG/ML  SOLN
10.0000 mL | Freq: Once | INTRAMUSCULAR | Status: DC
  Filled 2019-10-25: qty 20

## 2019-10-25 NOTE — Progress Notes (Signed)
Safety precautions to be maintained throughout the outpatient stay will include: orient to surroundings, keep bed in low position, maintain call bell within reach at all times, provide assistance with transfer out of bed and ambulation.  

## 2019-10-25 NOTE — Progress Notes (Signed)
PROVIDER NOTE: Information contained herein reflects review and annotations entered in association with encounter. Interpretation of such information and data should be left to medically-trained personnel. Information provided to patient can be located elsewhere in the medical record under "Patient Instructions". Document created using STT-dictation technology, any transcriptional errors that may result from process are unintentional.    Patient: Lisa Good  Service Category: Procedure  Provider: Gillis Santa, MD  DOB: 08-12-1951  DOS: 10/25/2019  Location: [facility]  MRN: [id number]  Setting: Ambulatory - outpatient  Referring Provider: [name], MD  Type: Established Patient  Specialty: Interventional Pain Management  PCP: [name], MD   Primary Reason for Visit: Interventional Pain Management Treatment. CC: Shoulder Pain (right pain in right deltoid, upper arm) and Knee Pain (left )  Procedure #1:  Anesthesia, Analgesia, Anxiolysis:  Type: Diagnostic Intra-Articular Local anesthetic and steroid Knee Injection #1  Region: Medial infrapatellar Knee Region Level: Knee Joint Laterality: Left knee  Type: Local Anesthesia Indication(s): Analgesia         Local Anesthetic: Lidocaine 1-2% Route: Infiltration ([place]/IM) IV Access: Declined Sedation: Declined   Position: Sitting   Indications: 1. Primary osteoarthritis of left knee   2. Primary osteoarthritis of right shoulder    Pain Score: Pre-procedure: 5 /10 Post-procedure: 3 (shoulder pain after rotation)/10   Pre-op Assessment:  Lisa Good is a 69 y.o. (year old), female patient, seen today for interventional treatment. She  has a past surgical history that includes Spine surgery; Joint replacement (Right); Tubal ligation; Brain surgery (2002); and Extracorporeal shock wave lithotripsy (Right, 04/27/2019). Lisa Good has a current medication list which includes the following prescription(s):  alpha-lipoic acid, amlodipine, aripiprazole, atorvastatin, belbuca, celecoxib, gabapentin, lisinopril, and naloxone, and the following Facility-Administered Medications: iohexol. Her primarily concern today is the Shoulder Pain (right pain in right deltoid, upper arm) and Knee Pain (left )  Initial Vital Signs:  Pulse/HCG Rate: 89ECG Heart Rate: 91 Temp: 98.1 F (36.7 C) Resp: 16 BP: (!) 146/89 SpO2: 100 %  BMI: Estimated body mass index is 33.47 kg/m as calculated from the following:   Height as of this encounter: 5' 4"  (1.626 m).   Weight as of this encounter: 195 lb (88.5 kg).  Risk Assessment: Allergies: Reviewed. She is allergic to contrast media [iodinated diagnostic agents] and cephalexin.  Allergy Precautions: None required Coagulopathies: Reviewed. None identified.  Blood-thinner therapy: None at this time Active Infection(s): Reviewed. None identified. Lisa Good is afebrile  Site Confirmation: Lisa Good was asked to confirm the procedure and laterality before marking the site Procedure checklist: Completed Consent: Before the procedure and under the influence of no sedative(s), amnesic(s), or anxiolytics, the patient was informed of the treatment options, risks and possible complications. To fulfill our ethical and legal obligations, as recommended by the American Medical Association's Code of Ethics, I have informed the patient of my clinical impression; the nature and purpose of the treatment or procedure; the risks, benefits, and possible complications of the intervention; the alternatives, including doing nothing; the risk(s) and benefit(s) of the alternative treatment(s) or procedure(s); and the risk(s) and benefit(s) of doing nothing. The patient was provided information about the general risks and possible complications associated with the procedure. These may include, but are not limited to: failure to achieve desired goals, infection, bleeding, organ or nerve damage,  allergic reactions, paralysis, and death. In addition, the patient was informed of those risks and complications associated to the procedure, such as failure to decrease  pain; infection; bleeding; organ or nerve damage with subsequent damage to sensory, motor, and/or autonomic systems, resulting in permanent pain, numbness, and/or weakness of one or several areas of the body; allergic reactions; (i.e.: anaphylactic reaction); and/or death. Furthermore, the patient was informed of those risks and complications associated with the medications. These include, but are not limited to: allergic reactions (i.e.: anaphylactic or anaphylactoid reaction(s)); adrenal axis suppression; blood sugar elevation that in diabetics may result in ketoacidosis or comma; water retention that in patients with history of congestive heart failure may result in shortness of breath, pulmonary edema, and decompensation with resultant heart failure; weight gain; swelling or edema; medication-induced neural toxicity; particulate matter embolism and blood vessel occlusion with resultant organ, and/or nervous system infarction; and/or aseptic necrosis of one or more joints. Finally, the patient was informed that Medicine is not an exact science; therefore, there is also the possibility of unforeseen or unpredictable risks and/or possible complications that may result in a catastrophic outcome. The patient indicated having understood very clearly. We have given the patient no guarantees and we have made no promises. Enough time was given to the patient to ask questions, all of which were answered to the patient's satisfaction. Lisa Good has indicated that she wanted to continue with the procedure. Attestation: I, the ordering provider, attest that I have discussed with the patient the benefits, risks, side-effects, alternatives, likelihood of achieving goals, and potential problems during recovery for the procedure that I have provided  informed consent. Date  Time: 10/25/2019 10:39 AM  Pre-Procedure Preparation:  Monitoring: As per clinic protocol. Respiration, ETCO2, SpO2, BP, heart rate and rhythm monitor placed and checked for adequate function Safety Precautions: Patient was assessed for positional comfort and pressure points before starting the procedure. Time-out: I initiated and conducted the "Time-out" before starting the procedure, as per protocol. The patient was asked to participate by confirming the accuracy of the "Time Out" information. Verification of the correct person, site, and procedure were performed and confirmed by me, the nursing staff, and the patient. "Time-out" conducted as per Joint Commission's Universal Protocol (UP.01.01.01). Time: 1122  Description of Procedure:          Target Area: Knee Joint Approach: Just above the Medial tibial plateau, lateral to the infrapatellar tendon. Area Prepped: Entire knee area, from the mid-thigh to the mid-shin. Prepping solution: DuraPrep (Iodine Povacrylex [0.7% available iodine] and Isopropyl Alcohol, 74% w/w) Safety Precautions: Aspiration looking for blood return was conducted prior to all injections. At no point did we inject any substances, as a needle was being advanced. No attempts were made at seeking any paresthesias. Safe injection practices and needle disposal techniques used. Medications properly checked for expiration dates. SDV (single dose vial) medications used. Description of the Procedure: Protocol guidelines were followed. The patient was placed in position over the fluoroscopy table. The target area was identified and the area prepped in the usual manner. Skin & deeper tissues infiltrated with local anesthetic. Appropriate amount of time allowed to pass for local anesthetics to take effect. The procedure needles were then advanced to the target area. Proper needle placement secured. Negative aspiration confirmed. Solution injected in intermittent  fashion, asking for systemic symptoms every 0.5cc of injectate. The needles were then removed and the area cleansed, making sure to leave some of the prepping solution back to take advantage of its long term bactericidal properties. Vitals:   10/25/19 1049 10/25/19 1117 10/25/19 1123 10/25/19 1126  BP: (!) 146/89 (!) 141/88 134/70 135/72  Pulse: 89     Resp: 16 18 16 18   Temp: 98.1 F (36.7 C)     TempSrc: Temporal     SpO2: 100% 99% 99% 99%  Weight: 195 lb (88.5 kg)     Height: 5' 4"  (1.626 m)       Start Time: 1123 hrs. End Time: 1125 hrs. Materials:  Needle(s) Type: Regular needle Gauge: 25G Length: 1.5-in Medication(s): Please see orders for medications and dosing details. 5 cc solution made of 4 cc of 0.2% ropivacaine, 1 cc of methylprednisolone, 40 mg/cc.  Injected into left knee intra-articular knee Imaging Guidance:          Type of Imaging Technique: None used Indication(s): N/A Exposure Time: No patient exposure Contrast: None used. Fluoroscopic Guidance: N/A Ultrasound Guidance: N/A Interpretation: N/A  Antibiotic Prophylaxis:   Anti-infectives (From admission, onward)   None     Indication(s): None identified  Post-operative Assessment:  Post-procedure Vital Signs:  Pulse/HCG Rate: 8988 Temp: 98.1 F (36.7 C) Resp: 18 BP: 135/72 SpO2: 99 %  EBL: None  Complications: No immediate post-treatment complications observed by team, or reported by patient.  Note: The patient tolerated the entire procedure well. A repeat set of vitals were taken after the procedure and the patient was kept under observation following institutional policy, for this type of procedure. Post-procedural neurological assessment was performed, showing return to baseline, prior to discharge. The patient was provided with post-procedure discharge instructions, including a section on how to identify potential problems. Should any problems arise concerning this procedure, the patient was  given instructions to immediately contact us, at any time, without hesitation. In any case, we plan to contact the patient by telephone for a follow-up status report regarding this interventional procedure.  Comments:  No additional relevant information.  Plan of Care   Medications ordered for procedure: Meds ordered this encounter  Medications  . iohexol (OMNIPAQUE) 180 MG/ML injection 10 mL    Must be Myelogram-compatible. If not available, you may substitute with a water-soluble, non-ionic, hypoallergenic, myelogram-compatible radiological contrast medium.  Marland Kitchen lidocaine (XYLOCAINE) 2 % (with pres) injection 400 mg  . ropivacaine (PF) 2 mg/mL (0.2%) (NAROPIN) injection 9 mL  . methylPREDNISolone acetate (DEPO-MEDROL) injection 40 mg  . methylPREDNISolone acetate (DEPO-MEDROL) injection 40 mg   Medications administered: We administered lidocaine, ropivacaine (PF) 2 mg/mL (0.2%), methylPREDNISolone acetate, and methylPREDNISolone acetate.  See the medical record for exact dosing, route, and time of administration.  Follow-up plan:   Return for Keep sch. appt.     Recent Visits Date Type Provider Dept  08/21/19 Office Visit Gillis Santa, MD [facility]  08/02/19 Office Visit Gillis Santa, MD [facility]  Showing recent visits within past 90 days and meeting all other requirements   Today's Visits Date Type Provider Dept  10/25/19 Procedure visit Gillis Santa, MD [facility]  Showing today's visits and meeting all other requirements   Future Appointments Date Type Provider Dept  11/21/19 Appointment Gillis Santa, MD [facility]  Showing future appointments within next 90 days and meeting all other requirements   Disposition: Discharge home  Discharge (Date  Time): 10/25/2019; 1145 hrs.   Primary Care Physician: [name], MD Location: [facility] Note by: Gillis Santa, MD Date: 10/25/2019;  Time: 11:58 AM  Disclaimer:  Medicine is not an exact science. The only guarantee in medicine is that nothing is guaranteed. It is important to note that the decision to proceed  with this intervention was based on the information collected from the patient. The Data and conclusions were drawn from the patient's questionnaire, the interview, and the physical examination. Because the information was provided in large part by the patient, it cannot be guaranteed that it has not been purposely or unconsciously manipulated. Every effort has been made to obtain as much relevant data as possible for this evaluation. It is important to note that the conclusions that lead to this procedure are derived in large part from the available data. Always take into account that the treatment will also be dependent on availability of resources and existing treatment guidelines, considered by other Pain Management Practitioners as being common knowledge and practice, at the time of the intervention. For Medico-Legal purposes, it is also important to point out that variation in procedural techniques and pharmacological choices are the acceptable norm. The indications, contraindications, technique, and results of the above procedure should only be interpreted and judged by a Board-Certified Interventional Pain Specialist with extensive familiarity and expertise in the same exact procedure and technique.

## 2019-10-25 NOTE — Patient Instructions (Addendum)
____________________________________________________________________________________________  Post-Procedure Discharge Instructions  Instructions:  Apply ice:   Purpose: This will minimize any swelling and discomfort after procedure.   When: Day of procedure, as soon as you get home.  How: Fill a plastic sandwich bag with crushed ice. Cover it with a small towel and apply to injection site.  How long: (15 min on, 15 min off) Apply for 15 minutes then remove x 15 minutes.  Repeat sequence on day of procedure, until you go to bed.  Apply heat:   Purpose: To treat any soreness and discomfort from the procedure.  When: Starting the next day after the procedure.  How: Apply heat to procedure site starting the day following the procedure.  How long: May continue to repeat daily, until discomfort goes away.  Food intake: Start with clear liquids (like water) and advance to regular food, as tolerated.   Physical activities: Keep activities to a minimum for the first 8 hours after the procedure. After that, then as tolerated.  Driving: If you have received any sedation, be responsible and do not drive. You are not allowed to drive for 24 hours after having sedation.  Blood thinner: (Applies only to those taking blood thinners) You may restart your blood thinner 6 hours after your procedure.  Insulin: (Applies only to Diabetic patients taking insulin) As soon as you can eat, you may resume your normal dosing schedule.  Infection prevention: Keep procedure site clean and dry. Shower daily and clean area with soap and water.  Post-procedure Pain Diary: Extremely important that this be done correctly and accurately. Recorded information will be used to determine the next step in treatment. For the purpose of accuracy, follow these rules:  Evaluate only the area treated. Do not report or include pain from an untreated area. For the purpose of this evaluation, ignore all other areas of pain,  except for the treated area.  After your procedure, avoid taking a long nap and attempting to complete the pain diary after you wake up. Instead, set your alarm clock to go off every hour, on the hour, for the initial 8 hours after the procedure. Document the duration of the numbing medicine, and the relief you are getting from it.  Do not go to sleep and attempt to complete it later. It will not be accurate. If you received sedation, it is likely that you were given a medication that may cause amnesia. Because of this, completing the diary at a later time may cause the information to be inaccurate. This information is needed to plan your care.  Follow-up appointment: Keep your post-procedure follow-up evaluation appointment after the procedure (usually 2 weeks for most procedures, 6 weeks for radiofrequencies). DO NOT FORGET to bring you pain diary with you.   Expect: (What should I expect to see with my procedure?)  From numbing medicine (AKA: Local Anesthetics): Numbness or decrease in pain. You may also experience some weakness, which if present, could last for the duration of the local anesthetic.  Onset: Full effect within 15 minutes of injected.  Duration: It will depend on the type of local anesthetic used. On the average, 1 to 8 hours.   From steroids (Applies only if steroids were used): Decrease in swelling or inflammation. Once inflammation is improved, relief of the pain will follow.  Onset of benefits: Depends on the amount of swelling present. The more swelling, the longer it will take for the benefits to be seen. In some cases, up to 10 days.  Duration: Steroids will stay in the system x 2 weeks. Duration of benefits will depend on multiple posibilities including persistent irritating factors.  Side-effects: If present, they may typically last 2 weeks (the duration of the steroids).  Frequent: Cramps (if they occur, drink Gatorade and take over-the-counter Magnesium 450-500 mg  once to twice a day); water retention with temporary weight gain; increases in blood sugar; decreased immune system response; increased appetite.  Occasional: Facial flushing (red, warm cheeks); mood swings; menstrual changes.  Uncommon: Long-term decrease or suppression of natural hormones; bone thinning. (These are more common with higher doses or more frequent use. This is why we prefer that our patients avoid having any injection therapies in other practices.)   Very Rare: Severe mood changes; psychosis; aseptic necrosis.  From procedure: Some discomfort is to be expected once the numbing medicine wears off. This should be minimal if ice and heat are applied as instructed.  Call if: (When should I call?)  You experience numbness and weakness that gets worse with time, as opposed to wearing off.  New onset bowel or bladder incontinence. (Applies only to procedures done in the spine)  Emergency Numbers:  Durning business hours (Monday - Thursday, 8:00 AM - 4:00 PM) (Friday, 9:00 AM - 12:00 Noon): [phone]  After hours: [phone]  NOTE: If you are having a problem and are unable connect with, or to talk to a provider, then go to your nearest urgent care or emergency department. If the problem is serious and urgent, please call 911. ____________________________________________________________________________________________    Trigger Point Injection Trigger points are areas where you have pain. A trigger point injection is a shot given in the trigger point to help relieve pain for a few days to a few months. Common places for trigger points include:  The neck.  The shoulders.  The upper back.  The lower back. A trigger point injection will not cure long-term (chronic) pain permanently. These injections do not always work for every person. For some people, they can help to relieve pain for a few days to a few months. Tell a health care provider about:  Any allergies  you have.  All medicines you are taking, including vitamins, herbs, eye drops, creams, and over-the-counter medicines.  Any problems you or family members have had with anesthetic medicines.  Any blood disorders you have.  Any surgeries you have had.  Any medical conditions you have. What are the risks? Generally, this is a safe procedure. However, problems may occur, including:  Infection.  Bleeding or bruising.  Allergic reaction to the injected medicine.  Irritation of the skin around the injection site. What happens before the procedure? Ask your health care provider about:  Changing or stopping your regular medicines. This is especially important if you are taking diabetes medicines or blood thinners.  Taking medicines such as aspirin and ibuprofen. These medicines can thin your blood. Do not take these medicines unless your health care provider tells you to take them.  Taking over-the-counter medicines, vitamins, herbs, and supplements. What happens during the procedure?   Your health care provider will feel for trigger points. A marker may be used to circle the area for the injection.  The skin over the trigger point will be washed with a germ-killing (antiseptic) solution.  A thin needle is used for the injection. You may feel pain or a twitching feeling when the needle enters the trigger point.  A numbing solution may be injected into the trigger point.  Sometimes a medicine to keep down inflammation is also injected.  Your health care provider may move the needle around the area where the trigger point is located until the tightness and twitching goes away.  After the injection, your health care provider may put gentle pressure over the injection site.  The injection site will be covered with a bandage (dressing). The procedure may vary among health care providers and hospitals. What can I expect after treatment? After treatment, you may have:  Soreness and  stiffness for 1-2 days.  A dressing. This can be taken off in a few hours or as told by your health care provider. Follow these instructions at home: Injection site care  Remove your dressing as told by your health care provider.  Check your injection site every day for signs of infection. Check for: ? Redness, swelling, or pain. ? Fluid or blood. ? Warmth. ? Pus or a bad smell. Managing pain, stiffness, and swelling  If directed, put ice on the affected area. ? Put ice in a plastic bag. ? Place a towel between your skin and the bag. ? Leave the ice on for 20 minutes, 2-3 times a day. General instructions  If you were asked to stop your regular medicines, ask your health care provider when you may start taking them again.  Return to your normal activities as told by your health care provider. Ask your health care provider what activities are safe for you.  Do not take baths, swim, or use a hot tub until your health care provider approves.  You may be asked to see an occupational or physical therapist for exercises that reduce muscle strain and stretch the area of the trigger point.  Keep all follow-up visits as told by your health care provider. This is important. Contact a health care provider if:  Your pain comes back, and it is worse than before the injection. You may need more injections.  You have chills or a fever.  The injection site becomes more painful, red, swollen, or warm to the touch. Summary  A trigger point injection is a shot given in the trigger point to help relieve pain for a few days to a few months.  Common places for trigger point injections are the neck, shoulder, upper back, and lower back.  These injections do not always work for every person, but for some people, the injections can help to relieve pain for a few days to a few months.  Contact a health care provider if symptoms come back or they are worse than before treatment. Also, get help if  the injection site becomes more painful, red, swollen, or warm to the touch. This information is not intended to replace advice given to you by your health care provider. Make sure you discuss any questions you have with your health care provider. Document Revised: 11/02/2018 Document Reviewed: 11/02/2018 [employer] Patient Education  [place]. Pain Management Discharge Instructions  General Discharge Instructions :  If you need to reach your doctor call: Monday-Friday 8:00 am - 4:00 pm at [phone] or toll free [phone].  After clinic hours [phone] to have operator reach doctor.  Bring all of your medication bottles to all your appointments in the pain clinic.  To cancel or reschedule your appointment with Pain Management please remember to call 24 hours in advance to avoid a fee.  Refer to the educational materials which you have been given on: General Risks, I had my Procedure. Discharge Instructions, Post  Sedation.  Post Procedure Instructions:  The drugs you were given will stay in your system until tomorrow, so for the next 24 hours you should not drive, make any legal decisions or drink any alcoholic beverages.  You may eat anything you prefer, but it is better to start with liquids then soups and crackers, and gradually work up to solid foods.  Please notify your doctor immediately if you have any unusual bleeding, trouble breathing or pain that is not related to your normal pain.  Depending on the type of procedure that was done, some parts of your body may feel week and/or numb.  This usually clears up by tonight or the next day.  Walk with the use of an assistive device or accompanied by an adult for the 24 hours.  You may use ice on the affected area for the first 24 hours.  Put ice in a Ziploc bag and cover with a towel and place against area 15 minutes on 15 minutes off.  You may switch to heat after 24 hours.

## 2019-10-25 NOTE — Progress Notes (Signed)
PROVIDER NOTE: Information contained herein reflects review and annotations entered in association with encounter. Interpretation of such information and data should be left to medically-trained personnel. Information provided to patient can be located elsewhere in the medical record under "Patient Instructions". Document created using STT-dictation technology, any transcriptional errors that may result from process are unintentional.    Patient: Lisa Good  Service Category: Procedure  Provider: Gillis Santa, MD  DOB: 12-20-1950  DOS: 10/25/2019  Location: [facility]  MRN: [id number]  Setting: Ambulatory - outpatient  Referring Provider: [name], MD  Type: Established Patient  Specialty: Interventional Pain Management  PCP: [name], MD   Primary Reason for Visit: Interventional Pain Management Treatment. CC: Shoulder Pain (right pain in right deltoid, upper arm) and Knee Pain (left )  Procedure #2:  Anesthesia, Analgesia, Anxiolysis:  Type: Diagnostic Glenohumeral Joint (shoulder) Injection #1  Primary Purpose: Diagnostic Region: Superior Shoulder Area Level:  Shoulder Target Area: Glenohumeral Joint (shoulder) Approach: Posterior approach. Laterality: Right-Sided  Type: Local Anesthesia  Local Anesthetic: Lidocaine 1-2%  Position: Supine   Indications: 1. Primary osteoarthritis of left knee   2. Primary osteoarthritis of right shoulder    Pain Score: Pre-procedure: 5 /10 Post-procedure: 3 (shoulder pain after rotation)/10   Pre-op Assessment:  Lisa Good is a 69 y.o. (year old), female patient, seen today for interventional treatment. She  has a past surgical history that includes Spine surgery; Joint replacement (Right); Tubal ligation; Brain surgery (2002); and Extracorporeal shock wave lithotripsy (Right, 04/27/2019). Lisa Good has a current medication list which includes the following prescription(s): alpha-lipoic acid, amlodipine,  aripiprazole, atorvastatin, belbuca, celecoxib, gabapentin, lisinopril, and naloxone, and the following Facility-Administered Medications: iohexol. Her primarily concern today is the Shoulder Pain (right pain in right deltoid, upper arm) and Knee Pain (left )  Initial Vital Signs:  Pulse/HCG Rate: 89ECG Heart Rate: 91 Temp: 98.1 F (36.7 C) Resp: 16 BP: (!) 146/89 SpO2: 100 %  BMI: Estimated body mass index is 33.47 kg/m as calculated from the following:   Height as of this encounter: 5' 4"  (1.626 m).   Weight as of this encounter: 195 lb (88.5 kg).  Risk Assessment: Allergies: Reviewed. She is allergic to contrast media [iodinated diagnostic agents] and cephalexin.  Allergy Precautions: None required Coagulopathies: Reviewed. None identified.  Blood-thinner therapy: None at this time Active Infection(s): Reviewed. None identified. Lisa Good is afebrile  Site Confirmation: Lisa Good was asked to confirm the procedure and laterality before marking the site Procedure checklist: Completed Consent: Before the procedure and under the influence of no sedative(s), amnesic(s), or anxiolytics, the patient was informed of the treatment options, risks and possible complications. To fulfill our ethical and legal obligations, as recommended by the American Medical Association's Code of Ethics, I have informed the patient of my clinical impression; the nature and purpose of the treatment or procedure; the risks, benefits, and possible complications of the intervention; the alternatives, including doing nothing; the risk(s) and benefit(s) of the alternative treatment(s) or procedure(s); and the risk(s) and benefit(s) of doing nothing. The patient was provided information about the general risks and possible complications associated with the procedure. These may include, but are not limited to: failure to achieve desired goals, infection, bleeding, organ or nerve damage, allergic reactions, paralysis, and  death. In addition, the patient was informed of those risks and complications associated to the procedure, such as failure to decrease pain; infection; bleeding; organ or nerve damage with subsequent damage to sensory,  motor, and/or autonomic systems, resulting in permanent pain, numbness, and/or weakness of one or several areas of the body; allergic reactions; (i.e.: anaphylactic reaction); and/or death. Furthermore, the patient was informed of those risks and complications associated with the medications. These include, but are not limited to: allergic reactions (i.e.: anaphylactic or anaphylactoid reaction(s)); adrenal axis suppression; blood sugar elevation that in diabetics may result in ketoacidosis or comma; water retention that in patients with history of congestive heart failure may result in shortness of breath, pulmonary edema, and decompensation with resultant heart failure; weight gain; swelling or edema; medication-induced neural toxicity; particulate matter embolism and blood vessel occlusion with resultant organ, and/or nervous system infarction; and/or aseptic necrosis of one or more joints. Finally, the patient was informed that Medicine is not an exact science; therefore, there is also the possibility of unforeseen or unpredictable risks and/or possible complications that may result in a catastrophic outcome. The patient indicated having understood very clearly. We have given the patient no guarantees and we have made no promises. Enough time was given to the patient to ask questions, all of which were answered to the patient's satisfaction. Lisa Good has indicated that she wanted to continue with the procedure. Attestation: I, the ordering provider, attest that I have discussed with the patient the benefits, risks, side-effects, alternatives, likelihood of achieving goals, and potential problems during recovery for the procedure that I have provided informed consent. Date  Time: 10/25/2019  10:39 AM  Pre-Procedure Preparation:  Monitoring: As per clinic protocol. Respiration, ETCO2, SpO2, BP, heart rate and rhythm monitor placed and checked for adequate function Safety Precautions: Patient was assessed for positional comfort and pressure points before starting the procedure. Time-out: I initiated and conducted the "Time-out" before starting the procedure, as per protocol. The patient was asked to participate by confirming the accuracy of the "Time Out" information. Verification of the correct person, site, and procedure were performed and confirmed by me, the nursing staff, and the patient. "Time-out" conducted as per Joint Commission's Universal Protocol (UP.01.01.01). Time: 1122  Description of Procedure:          Area Prepped: Entire shoulder Area Prepping solution: DuraPrep (Iodine Povacrylex [0.7% available iodine] and Isopropyl Alcohol, 74% w/w) Safety Precautions: Aspiration looking for blood return was conducted prior to all injections. At no point did we inject any substances, as a needle was being advanced. No attempts were made at seeking any paresthesias. Safe injection practices and needle disposal techniques used. Medications properly checked for expiration dates. SDV (single dose vial) medications used. Description of the Procedure: Protocol guidelines were followed. The patient was placed in position over the procedure table. The target area was identified and the area prepped in the usual manner. Skin & deeper tissues infiltrated with local anesthetic. Appropriate amount of time allowed to pass for local anesthetics to take effect. The procedure needles were then advanced to the target area. Proper needle placement secured. Negative aspiration confirmed. Solution injected in intermittent fashion, asking for systemic symptoms every 0.5cc of injectate. The needles were then removed and the area cleansed, making sure to leave some of the prepping solution back to take  advantage of its long term bactericidal properties.    Vitals:   10/25/19 1049 10/25/19 1117 10/25/19 1123 10/25/19 1126  BP: (!) 146/89 (!) 141/88 134/70 135/72  Pulse: 89     Resp: 16 18 16 18   Temp: 98.1 F (36.7 C)     TempSrc: Temporal     SpO2: 100% 99%  99% 99%  Weight: 195 lb (88.5 kg)     Height: 5' 4"  (1.626 m)       Start Time: 1123 hrs. End Time: 1125 hrs. Materials:  Needle(s) Type: Spinal Needle Gauge: 25G Length: 3.5-in Medication(s): Please see orders for medications and dosing details. 5 cc solution made of 4 cc of 0.2% ropivacaine, 1 cc of methylprednisolone, 40 mg/cc.  This was injected into the right glenohumeral joint under fluoroscopy. Imaging Guidance (Non-Spinal):          Type of Imaging Technique: Fluoroscopy Guidance (Non-Spinal) Indication(s): Assistance in needle guidance and placement for procedures requiring needle placement in or near specific anatomical locations not easily accessible without such assistance. Exposure Time: Please see nurses notes. Contrast: None used.  Contrast allergy Fluoroscopic Guidance: I was personally present during the use of fluoroscopy. "Tunnel Vision Technique" used to obtain the best possible view of the target area. Parallax error corrected before commencing the procedure. "Direction-depth-direction" technique used to introduce the needle under continuous pulsed fluoroscopy. Once target was reached, antero-posterior, oblique, and lateral fluoroscopic projection used confirm needle placement in all planes. Images permanently stored in EMR. Interpretation: No contrast injected. I personally interpreted the imaging intraoperatively. Adequate needle placement confirmed in multiple planes. Permanent images saved into the patient's record.  Antibiotic Prophylaxis:   Anti-infectives (From admission, onward)   None     Indication(s): None identified  Post-operative Assessment:  Post-procedure Vital Signs:  Pulse/HCG  Rate: 8988 Temp: 98.1 F (36.7 C) Resp: 18 BP: 135/72 SpO2: 99 %  EBL: None  Complications: No immediate post-treatment complications observed by team, or reported by patient.  Note: The patient tolerated the entire procedure well. A repeat set of vitals were taken after the procedure and the patient was kept under observation following institutional policy, for this type of procedure. Post-procedural neurological assessment was performed, showing return to baseline, prior to discharge. The patient was provided with post-procedure discharge instructions, including a section on how to identify potential problems. Should any problems arise concerning this procedure, the patient was given instructions to immediately contact us, at any time, without hesitation. In any case, we plan to contact the patient by telephone for a follow-up status report regarding this interventional procedure.  Comments:  No additional relevant information.  Plan of Care  Orders:  Orders Placed This Encounter  Procedures  . [facility] C-ARM 1-60 MIN NO REPORT    Intraoperative interpretation by procedural physician at [place].    Standing Status:   Standing    Number of Occurrences:   1    Order Specific Question:   Reason for exam:    Answer:   Assistance in needle guidance and placement for procedures requiring needle placement in or near specific anatomical locations not easily accessible without such assistance.   Medications ordered for procedure: Meds ordered this encounter  Medications  . iohexol (OMNIPAQUE) 180 MG/ML injection 10 mL    Must be Myelogram-compatible. If not available, you may substitute with a water-soluble, non-ionic, hypoallergenic, myelogram-compatible radiological contrast medium.  Marland Kitchen lidocaine (XYLOCAINE) 2 % (with pres) injection 400 mg  . ropivacaine (PF) 2 mg/mL (0.2%) (NAROPIN) injection 9 mL  . methylPREDNISolone acetate (DEPO-MEDROL) injection 40 mg  .  methylPREDNISolone acetate (DEPO-MEDROL) injection 40 mg   Medications administered: We administered lidocaine, ropivacaine (PF) 2 mg/mL (0.2%), methylPREDNISolone acetate, and methylPREDNISolone acetate.  See the medical record for exact dosing, route, and time of administration.  Follow-up plan:   Return  for Keep sch. appt.     Recent Visits Date Type Provider Dept  08/21/19 Office Visit Gillis Santa, MD [facility]  08/02/19 Office Visit Gillis Santa, MD [facility]  Showing recent visits within past 90 days and meeting all other requirements   Today's Visits Date Type Provider Dept  10/25/19 Procedure visit Gillis Santa, MD [facility]  Showing today's visits and meeting all other requirements   Future Appointments Date Type Provider Dept  11/21/19 Appointment Gillis Santa, MD [facility]  Showing future appointments within next 90 days and meeting all other requirements   Disposition: Discharge home  Discharge (Date  Time): 10/25/2019; 1145 hrs.   Primary Care Physician: [name], MD Location: [facility] Note by: Gillis Santa, MD Date: 10/25/2019; Time: 12:20 PM  Disclaimer:  Medicine is not an exact science. The only guarantee in medicine is that nothing is guaranteed. It is important to note that the decision to proceed with this intervention was based on the information collected from the patient. The Data and conclusions were drawn from the patient's questionnaire, the interview, and the physical examination. Because the information was provided in large part by the patient, it cannot be guaranteed that it has not been purposely or unconsciously manipulated. Every effort has been made to obtain as much relevant data as possible for this evaluation. It is important to note that the conclusions that lead to this procedure are derived in large part from the available data. Always take into  account that the treatment will also be dependent on availability of resources and existing treatment guidelines, considered by other Pain Management Practitioners as being common knowledge and practice, at the time of the intervention. For Medico-Legal purposes, it is also important to point out that variation in procedural techniques and pharmacological choices are the acceptable norm. The indications, contraindications, technique, and results of the above procedure should only be interpreted and judged by a Board-Certified Interventional Pain Specialist with extensive familiarity and expertise in the same exact procedure and technique.

## 2019-10-26 ENCOUNTER — Telehealth: Payer: Self-pay

## 2019-10-26 NOTE — Telephone Encounter (Signed)
Post procedure phone call.  LM 

## 2019-10-30 ENCOUNTER — Encounter: Payer: Self-pay | Admitting: Oncology

## 2019-10-30 ENCOUNTER — Other Ambulatory Visit: Payer: Self-pay

## 2019-10-30 NOTE — Progress Notes (Signed)
Patient reffered by Dr. Bonna Gains for Iron deficiency anemia. Patient aware of referral. She states that [facility] All questions answered and directions to cancer center were given.

## 2019-10-31 ENCOUNTER — Other Ambulatory Visit: Payer: Self-pay

## 2019-10-31 ENCOUNTER — Encounter: Payer: Self-pay | Admitting: Oncology

## 2019-10-31 ENCOUNTER — Inpatient Hospital Stay: Payer: Medicare Other

## 2019-10-31 ENCOUNTER — Inpatient Hospital Stay: Payer: Medicare Other | Attending: Oncology | Admitting: Oncology

## 2019-10-31 VITALS — BP 155/83 | HR 91 | Temp 97.3°F | Resp 20 | Wt 187.2 lb

## 2019-10-31 DIAGNOSIS — E538 Deficiency of other specified B group vitamins: Secondary | ICD-10-CM

## 2019-10-31 DIAGNOSIS — D509 Iron deficiency anemia, unspecified: Secondary | ICD-10-CM | POA: Diagnosis not present

## 2019-10-31 DIAGNOSIS — I10 Essential (primary) hypertension: Secondary | ICD-10-CM | POA: Diagnosis not present

## 2019-10-31 DIAGNOSIS — Z87891 Personal history of nicotine dependence: Secondary | ICD-10-CM

## 2019-10-31 DIAGNOSIS — D500 Iron deficiency anemia secondary to blood loss (chronic): Secondary | ICD-10-CM

## 2019-10-31 LAB — CBC WITH DIFFERENTIAL/PLATELET
Abs Immature Granulocytes: 0.02 10*3/uL (ref 0.00–0.07)
Basophils Absolute: 0.1 10*3/uL (ref 0.0–0.1)
Basophils Relative: 1 %
Eosinophils Absolute: 0.1 10*3/uL (ref 0.0–0.5)
Eosinophils Relative: 2 %
HCT: 41 % (ref 36.0–46.0)
Hemoglobin: 12.4 g/dL (ref 12.0–15.0)
Immature Granulocytes: 0 %
Lymphocytes Relative: 27 %
Lymphs Abs: 1.9 10*3/uL (ref 0.7–4.0)
MCH: 27.5 pg (ref 26.0–34.0)
MCHC: 30.2 g/dL (ref 30.0–36.0)
MCV: 90.9 fL (ref 80.0–100.0)
Monocytes Absolute: 0.4 10*3/uL (ref 0.1–1.0)
Monocytes Relative: 6 %
Neutro Abs: 4.5 10*3/uL (ref 1.7–7.7)
Neutrophils Relative %: 64 %
Platelets: 223 10*3/uL (ref 150–400)
RBC: 4.51 MIL/uL (ref 3.87–5.11)
RDW: 13.6 % (ref 11.5–15.5)
WBC: 7 10*3/uL (ref 4.0–10.5)
nRBC: 0 % (ref 0.0–0.2)

## 2019-10-31 LAB — VITAMIN B12: Vitamin B-12: 256 pg/mL (ref 180–914)

## 2019-10-31 LAB — IRON AND TIBC
Iron: 41 ug/dL (ref 28–170)
Saturation Ratios: 11 % (ref 10.4–31.8)
TIBC: 377 ug/dL (ref 250–450)
UIBC: 336 ug/dL

## 2019-10-31 LAB — FERRITIN: Ferritin: 15 ng/mL (ref 11–307)

## 2019-11-01 ENCOUNTER — Encounter: Payer: Self-pay | Admitting: Oncology

## 2019-11-01 DIAGNOSIS — D509 Iron deficiency anemia, unspecified: Secondary | ICD-10-CM

## 2019-11-01 HISTORY — DX: Iron deficiency anemia, unspecified: D50.9

## 2019-11-01 MED ORDER — POLYSACCHARIDE IRON COMPLEX 150 MG PO CAPS: 150.0000 mg | ORAL_CAPSULE | Freq: Every day | ORAL | 0 refills | Status: DC

## 2019-11-01 MED ORDER — VITAMIN B-12 1000 MCG PO TABS: 1000.0000 ug | ORAL_TABLET | Freq: Every day | ORAL | 1 refills | Status: DC

## 2019-11-01 NOTE — Progress Notes (Signed)
Hematology/Oncology Consult note [facility] Telephone:([phone] [phone]   Patient Care Team: [name], MD as PCP - General (Physician Assistant)  REFERRING PROVIDER: Virgel Manifold, MD CHIEF COMPLAINTS/REASON FOR VISIT:  Evaluation of iron deficiency anemia  HISTORY OF PRESENTING ILLNESS:  Lisa Good is a  69 y.o.  female with PMH listed below was seen in consultation at the request of Virgel Manifold, MD   for evaluation of iron deficiency anemia.   Patient was admitted in October 2020 due to pancolitis.  C. difficile testing was negative.  Patient was treated with antibiotics.  She also developed AKI and was improved after receiving supportive care.  She developed acute blood loss anemia with a hemoglobin 6.7.  Drop in the hemoglobin predominantly dilutional in addition on further testing she has iron deficiency anemia.  She received 1 unit of PRBC transfusion and also received IV iron. Upon discharge 07/22/2019, patient has a hemoglobin of 7.8. Patient was recently seen by gastroenterology Dr. Bonna Gains.  Patient was recommended to have EGD and colonoscopy for further evaluation.  Patient was referred to hematology for further evaluation of iron deficiency anemia and possible IV iron.  Patient reports not tolerating oral iron supplementation due to constipation.  Reviewed patient's recent labs that was done at [facility] clinic 10/24/2019 labs revealed anemia with hemoglobin of 12.4. 07/22/2019 iron panel shows iron saturation 6, ferritin 88, TIBC 186.  Patient reports feeling quite well today.  Fatigue level at baseline.  Denies any shortness of breath, chest pain.  Denies any bloody or black stool.    Review of Systems  Constitutional: Negative for appetite change, chills, fatigue and fever.  HENT:   Negative for hearing loss and voice change.   Eyes: Negative for eye problems.  Respiratory: Negative for chest  tightness and cough.   Cardiovascular: Negative for chest pain.  Gastrointestinal: Negative for abdominal distention, abdominal pain and blood in stool.  Endocrine: Negative for hot flashes.  Genitourinary: Negative for difficulty urinating and frequency.   Musculoskeletal: Negative for arthralgias.  Skin: Negative for itching and rash.  Neurological: Negative for extremity weakness.  Hematological: Negative for adenopathy.  Psychiatric/Behavioral: Negative for confusion.    MEDICAL HISTORY:  Past Medical History:  Diagnosis Date  . Allergy    seasonal  . Arthritis   . Depression   . GERD (gastroesophageal reflux disease)   . Hyperlipidemia   . Hypertension   . Neuromuscular disorder ([place])    peripheral neuropathy/feet  . Sleep apnea     SURGICAL HISTORY: Past Surgical History:  Procedure Laterality Date  . 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 EXTRACORPOREAL SHOCK WAVE LITHOTRIPSY Right 04/27/2019   Procedure: EXTRACORPOREAL SHOCK WAVE LITHOTRIPSY (ESWL);  Surgeon: Billey Co, MD;  Location: [facility] ORS;  Service: Urology;  Laterality: Right;  . JOINT REPLACEMENT Right    knee  . SPINE SURGERY     spinal fusion  . TUBAL LIGATION      SOCIAL HISTORY: Social History   Socioeconomic History  . Marital status: Married    Spouse name: Not on file  . Number of children: Not on file  . Years of education: Not on file  . Highest education level: Not on file  Occupational History  . Occupation: retired   Tobacco Use  . Smoking status: Former Smoker    Quit date: 10/29/2004    Years since quitting: 15.0  . Smokeless tobacco: Never Used  Substance and Sexual  Activity  . Alcohol use: Yes    Comment: rarely has a glass of wine  . Drug use: Never  . Sexual activity: Not Currently  Other Topics Concern  . Not on file  Social History Narrative  . Not on file   Social Determinants of Health   Financial Resource Strain: Low Risk   . Difficulty of Paying Living  Expenses: Not hard at all  Food Insecurity: No Food Insecurity  . Worried About Charity fundraiser in the Last Year: Never true  . Ran Out of Food in the Last Year: Never true  Transportation Needs: No Transportation Needs  . Lack of Transportation (Medical): No  . Lack of Transportation (Non-Medical): No  Physical Activity: Unknown  . Days of Exercise per Week: Patient refused  . Minutes of Exercise per Session: Patient refused  Stress: Unknown  . Feeling of Stress : Patient refused  Social Connections: Unknown  . Frequency of Communication with Friends and Family: Patient refused  . Frequency of Social Gatherings with Friends and Family: Patient refused  . Attends Religious Services: Patient refused  . Active Member of Clubs or Organizations: Patient refused  . Attends Archivist Meetings: Patient refused  . Marital Status: Patient refused  Intimate Partner Violence: Not At Risk  . Fear of Current or Ex-Partner: No  . Emotionally Abused: No  . Physically Abused: No  . Sexually Abused: No    FAMILY HISTORY: Family History  Problem Relation Age of Onset  . Hypertension Mother   . Hyperlipidemia Sister   . Hypertension Sister   . Depression Sister   . Depression Brother     ALLERGIES:  is allergic to contrast media [iodinated diagnostic agents] and cephalexin.  MEDICATIONS:  Current Outpatient Medications  Medication Sig Dispense Refill  . Alpha-Lipoic Acid 100 MG TABS Take 300 mg by mouth daily.     Marland Kitchen amLODipine (NORVASC) 10 MG tablet Take 10 mg by mouth daily.    . ARIPiprazole (ABILIFY) 2 MG tablet     . atorvastatin (LIPITOR) 40 MG tablet Take 40 mg by mouth daily.    . Buprenorphine HCl (BELBUCA) 600 MCG FILM Place 1 Film inside cheek every 12 (twelve) hours. 60 each 2  . celecoxib (CELEBREX) 200 MG capsule     . gabapentin (NEURONTIN) 800 MG tablet Take 1 tablet (800 mg total) by mouth 4 (four) times daily. 120 tablet 3  . lisinopril (PRINIVIL,ZESTRIL)  40 MG tablet Take 40 mg by mouth daily.    Marland Kitchen venlafaxine XR (EFFEXOR-XR) 150 MG 24 hr capsule     . naloxone (NARCAN) nasal spray 4 mg/0.1 mL Place 4 mg into the nose once.     No current facility-administered medications for this visit.     PHYSICAL EXAMINATION: ECOG PERFORMANCE STATUS: 0 - Asymptomatic Vitals:   10/31/19 1508  BP: (!) 155/83  Pulse: 91  Resp: 20  Temp: (!) 97.3 F (36.3 C)   Filed Weights   10/31/19 1508  Weight: 187 lb 3.2 oz (84.9 kg)    Physical Exam Constitutional:      General: She is not in acute distress. HENT:     Head: Normocephalic and atraumatic.  Eyes:     General: No scleral icterus.    Pupils: Pupils are equal, round, and reactive to light.  Cardiovascular:     Rate and Rhythm: Normal rate and regular rhythm.     Heart sounds: Normal heart sounds.  Pulmonary:  Effort: Pulmonary effort is normal. No respiratory distress.     Breath sounds: No wheezing.  Abdominal:     General: Bowel sounds are normal. There is no distension.     Palpations: Abdomen is soft. There is no mass.     Tenderness: There is no abdominal tenderness.  Musculoskeletal:        General: No deformity. Normal range of motion.     Cervical back: Normal range of motion and neck supple.  Skin:    General: Skin is warm and dry.     Findings: No erythema or rash.  Neurological:     Mental Status: She is alert and oriented to person, place, and time.     Cranial Nerves: No cranial nerve deficit.     Coordination: Coordination normal.  Psychiatric:        Mood and Affect: Mood normal.       CMP Latest Ref Rng & Units 07/22/2019  Glucose 70 - 99 mg/dL 89  BUN 8 - 23 mg/dL 16  Creatinine 0.44 - 1.00 mg/dL 0.87  Sodium 135 - 145 mmol/L 138  Potassium 3.5 - 5.1 mmol/L 4.8  Chloride 98 - 111 mmol/L 111  CO2 22 - 32 mmol/L 21(L)  Calcium 8.9 - 10.3 mg/dL 8.0(L)  Total Protein 6.5 - 8.1 g/dL -  Total Bilirubin 0.3 - 1.2 mg/dL -  Alkaline Phos 38 - 126 U/L -    AST 15 - 41 U/L -  ALT 0 - 44 U/L -   CBC Latest Ref Rng & Units 10/31/2019  WBC 4.0 - 10.5 K/uL 7.0  Hemoglobin 12.0 - 15.0 g/dL 12.4  Hematocrit 36.0 - 46.0 % 41.0  Platelets 150 - 400 K/uL 223     LABORATORY DATA:  I have reviewed the data as listed Lab Results  Component Value Date   WBC 7.0 10/31/2019   HGB 12.4 10/31/2019   HCT 41.0 10/31/2019   MCV 90.9 10/31/2019   PLT 223 10/31/2019   Recent Labs    07/20/19 1025 07/20/19 1025 07/20/19 1305 07/20/19 1305 07/20/19 1854 07/21/19 0602 07/22/19 0503  NA 133*   < > 133*  --   --  137 138  K 6.0*   < > 5.8*   < > 4.7 4.6 4.8  CL 95*   < > 104  --   --  107 111  CO2 21*   < > 24  --   --  23 21*  GLUCOSE 131*   < > 109*  --   --  95 89  BUN 46*   < > 41*  --   --  27* 16  CREATININE 2.10*   < > 1.82*  --   --  1.06* 0.87  CALCIUM 8.4*   < > 7.5*  --   --  7.6* 8.0*  GFRNONAA 24*   < > 28*  --   --  54* >60  GFRAA 27*   < > 33*  --   --  >60 >60  PROT 6.1*  --   --   --   --  5.3*  --   ALBUMIN 3.3*  --   --   --   --  2.6*  --   AST 32  --   --   --   --  20  --   ALT 15  --   --   --   --  9  --   ALKPHOS  100  --   --   --   --  77  --   BILITOT 0.5  --   --   --   --  0.7  --    < > = values in this interval not displayed.   Iron/TIBC/Ferritin/ %Sat    Component Value Date/Time   IRON 41 10/31/2019 1533   TIBC 377 10/31/2019 1533   FERRITIN 15 10/31/2019 1533   IRONPCTSAT 11 10/31/2019 1533     DG PAIN CLINIC C-ARM 1-60 MIN NO REPORT  Result Date: 10/25/2019 Fluoro was used, but no Radiologist interpretation will be provided. Please refer to "NOTES" tab for provider progress note.  DG SCOLIOSIS EVAL COMPLETE SPINE 2 OR 3 VIEWS  Result Date: 10/12/2019 CLINICAL DATA:  Kyphoscoliosis EXAM: DG SCOLIOSIS EVAL COMPLETE SPINE 2-3V COMPARISON:  None. FINDINGS: Posterior fusion from C2 into the upper thoracic spine. Prior corpectomy C4 and C5 with anterior fusion C3-C6 and at C6-7. Posterior fusion changes  from L2-S1. Mild compression deformity through the superior endplate of L2 with associated kyphosis. Advanced degenerative disc disease at L1-2. Approximately 17 degrees leftward scoliosis centered at L1. Approximately 15 degrees rightward scoliosis in the midthoracic spine. No acute bony abnormality. IMPRESSION: Postoperative and degenerative changes as above. Mild to moderate S-shaped thoracolumbar scoliosis with Cobb angles measured above. Electronically Signed   By: Rolm Baptise M.D.   On: 10/12/2019 09:33      ASSESSMENT & PLAN:  1. Iron deficiency anemia, unspecified iron deficiency anemia type   2. Low serum vitamin B12    Previous labs are reviewed and discussed with patient. Patient has history of iron deficiency.  Most recent CBC showed improved hemoglobin. I recommend check CBC, iron, TIBC ferritin. We discussed the option of IV Venofer treatments. Plan IV iron with Venofer 232m weekly x2 doses. Allergy reactions/infusion reaction including anaphylactic reaction discussed with patient. Other side effects include but not limited to high blood pressure, skin rash, weight gain, leg swelling, etc. patient voices understanding.  She would like to wait to see what is her current iron panel and hemoglobin before deciding whether to proceed with IV iron or continue oral iron supplementation.  She is leaning towards taking oral iron supplementation if iron levels are not too low.  Continue follow-up with gastroenterology.  Low normal vitamin B12 level.  Vitamin B12 was repeated, at the lower normal end.  I recommend patient to take oral vitamin B12 supplementation 1000 MCG daily.  Patient can follow-up in 4  months.  Orders Placed This Encounter  Procedures  . Ferritin    Standing Status:   Future    Number of Occurrences:   1    Standing Expiration Date:   10/30/2020  . CBC with Differential/Platelet    Standing Status:   Future    Number of Occurrences:   1    Standing Expiration  Date:   10/30/2020  . Iron and TIBC    Standing Status:   Future    Number of Occurrences:   1    Standing Expiration Date:   10/30/2020  . Vitamin B12    Standing Status:   Future    Number of Occurrences:   1    Standing Expiration Date:   10/30/2020    All questions were answered. The patient knows to call the clinic with any problems questions or concerns.  Cc TVirgel Manifold MD  Return of visit: 4 months Thank you for this kind referral  and the opportunity to participate in the care of this patient. A copy of today's note is routed to referring provider    Earlie Server, MD, PhD Hematology Oncology [facility] at [facility] Pager- [phone] 11/01/2019

## 2019-11-02 ENCOUNTER — Other Ambulatory Visit: Payer: Self-pay

## 2019-11-02 ENCOUNTER — Telehealth: Payer: Self-pay

## 2019-11-02 ENCOUNTER — Encounter: Payer: Self-pay | Admitting: Gastroenterology

## 2019-11-02 NOTE — Telephone Encounter (Signed)
Patient notified of lab results and medications sent to pharmacy. She prefers to start on oral iron and if it doesn't work she will call to set up venofer infusions.

## 2019-11-02 NOTE — Telephone Encounter (Signed)
Done..   Pt has been sched as requested.

## 2019-11-02 NOTE — Telephone Encounter (Signed)
-----   Message from Earlie Server, MD sent at 11/01/2019  5:20 PM EST ----- Please let patient know that her blood level has improved and is normal now.  She still does have some iron deficiency.  If she is able to tolerate oral iron supplementation, she can continue.  I have sent a prescription of Niferex to her pharmacy..  If she is not able to tolerate iron, I recommend her to proceed with IV Venofer weekly x2.  Her vitamin B12 level is normal but at the low normal end.  I recommend patient to take oral vitamin B12 1000 MCG daily.  Prescription was sent to pharmacy. Please schedule patient to follow-up in 4 months, lab MD.  Labs prior.  Labs are ordered.

## 2019-11-08 ENCOUNTER — Other Ambulatory Visit
Admission: RE | Admit: 2019-11-08 | Discharge: 2019-11-08 | Disposition: A | Discharge status: Home / Self Care | Payer: Medicare Other | Source: Ambulatory Visit | Attending: Gastroenterology | Admitting: Gastroenterology

## 2019-11-08 DIAGNOSIS — Z20822 Contact with and (suspected) exposure to covid-19: Secondary | ICD-10-CM | POA: Diagnosis not present

## 2019-11-08 DIAGNOSIS — Z01812 Encounter for preprocedural laboratory examination: Secondary | ICD-10-CM | POA: Diagnosis present

## 2019-11-08 NOTE — Anesthesia Preprocedure Evaluation (Addendum)
Anesthesia Evaluation  Patient identified by MRN, date of birth, ID band Patient awake    Reviewed: Allergy & Precautions, NPO status , Patient's Chart, lab work & pertinent test results  History of Anesthesia Complications Negative for: history of anesthetic complications  Airway Mallampati: II  TM Distance: >3 FB Neck ROM: Full    Dental   Pulmonary sleep apnea , former smoker,    breath sounds clear to auscultation       Cardiovascular hypertension, (-) angina(-) DOE  Rhythm:Regular Rate:Normal   HLD   Neuro/Psych PSYCHIATRIC DISORDERS Depression  Neuromuscular disease (Peripheral neuropathy, cervical stenosis)    GI/Hepatic PUD, GERD  Controlled,  Endo/Other    Renal/GU      Musculoskeletal  (+) Arthritis ,   Abdominal (+) + obese (BMI 33),   Peds  Hematology  (+) anemia ,   Anesthesia Other Findings   Reproductive/Obstetrics                            Anesthesia Physical Anesthesia Plan  ASA: II  Anesthesia Plan: General   Post-op Pain Management:    Induction: Intravenous  PONV Risk Score and Plan: 3 and Propofol infusion, TIVA and Treatment may vary due to age or medical condition  Airway Management Planned: Natural Airway and Nasal Cannula  Additional Equipment:   Intra-op Plan:   Post-operative Plan:   Informed Consent: I have reviewed the patients History and Physical, chart, labs and discussed the procedure including the risks, benefits and alternatives for the proposed anesthesia with the patient or authorized representative who has indicated his/her understanding and acceptance.       Plan Discussed with: CRNA and Anesthesiologist  Anesthesia Plan Comments:         Anesthesia Quick Evaluation

## 2019-11-09 LAB — SARS CORONAVIRUS 2 (TAT 6-24 HRS): SARS Coronavirus 2: NEGATIVE

## 2019-11-09 NOTE — Discharge Instructions (Signed)
General Anesthesia, Adult, Care After This sheet gives you information about how to care for yourself after your procedure. Your health care provider may also give you more specific instructions. If you have problems or questions, contact your health care provider. What can I expect after the procedure? After the procedure, the following side effects are common:  Pain or discomfort at the IV site.  Nausea.  Vomiting.  Sore throat.  Trouble concentrating.  Feeling cold or chills.  Weak or tired.  Sleepiness and fatigue.  Soreness and body aches. These side effects can affect parts of the body that were not involved in surgery. Follow these instructions at home:  For at least 24 hours after the procedure:  Have a responsible adult stay with you. It is important to have someone help care for you until you are awake and alert.  Rest as needed.  Do not: ? Participate in activities in which you could fall or become injured. ? Drive. ? Use heavy machinery. ? Drink alcohol. ? Take sleeping pills or medicines that cause drowsiness. ? Make important decisions or sign legal documents. ? Take care of children on your own. Eating and drinking  Follow any instructions from your health care provider about eating or drinking restrictions.  When you feel hungry, start by eating small amounts of foods that are soft and easy to digest (bland), such as toast. Gradually return to your regular diet.  Drink enough fluid to keep your urine pale yellow.  If you vomit, rehydrate by drinking water, juice, or clear broth. General instructions  If you have sleep apnea, surgery and certain medicines can increase your risk for breathing problems. Follow instructions from your health care provider about wearing your sleep device: ? Anytime you are sleeping, including during daytime naps. ? While taking prescription pain medicines, sleeping medicines, or medicines that make you drowsy.  Return to  your normal activities as told by your health care provider. Ask your health care provider what activities are safe for you.  Take over-the-counter and prescription medicines only as told by your health care provider.  If you smoke, do not smoke without supervision.  Keep all follow-up visits as told by your health care provider. This is important. Contact a health care provider if:  You have nausea or vomiting that does not get better with medicine.  You cannot eat or drink without vomiting.  You have pain that does not get better with medicine.  You are unable to pass urine.  You develop a skin rash.  You have a fever.  You have redness around your IV site that gets worse. Get help right away if:  You have difficulty breathing.  You have chest pain.  You have blood in your urine or stool, or you vomit blood. Summary  After the procedure, it is common to have a sore throat or nausea. It is also common to feel tired.  Have a responsible adult stay with you for the first 24 hours after general anesthesia. It is important to have someone help care for you until you are awake and alert.  When you feel hungry, start by eating small amounts of foods that are soft and easy to digest (bland), such as toast. Gradually return to your regular diet.  Drink enough fluid to keep your urine pale yellow.  Return to your normal activities as told by your health care provider. Ask your health care provider what activities are safe for you. This information is not   intended to replace advice given to you by your health care provider. Make sure you discuss any questions you have with your health care provider. Document Revised: 09/24/2017 Document Reviewed: 05/07/2017 Elsevier Patient Education  2020 Elsevier Inc.  

## 2019-11-10 ENCOUNTER — Ambulatory Visit: Payer: Medicare Other | Admitting: Anesthesiology

## 2019-11-10 ENCOUNTER — Encounter: Payer: Self-pay | Admitting: Gastroenterology

## 2019-11-10 ENCOUNTER — Other Ambulatory Visit: Payer: Self-pay

## 2019-11-10 ENCOUNTER — Encounter
Admission: RE | Disposition: A | Discharge status: Home / Self Care | Payer: Self-pay | Source: Home / Self Care | Attending: Gastroenterology

## 2019-11-10 ENCOUNTER — Ambulatory Visit
Admission: RE | Admit: 2019-11-10 | Discharge: 2019-11-10 | Disposition: A | Discharge status: Home / Self Care | Payer: Medicare Other | Attending: Gastroenterology | Admitting: Gastroenterology

## 2019-11-10 DIAGNOSIS — I10 Essential (primary) hypertension: Secondary | ICD-10-CM | POA: Insufficient documentation

## 2019-11-10 DIAGNOSIS — Z87891 Personal history of nicotine dependence: Secondary | ICD-10-CM | POA: Diagnosis not present

## 2019-11-10 DIAGNOSIS — K2950 Unspecified chronic gastritis without bleeding: Secondary | ICD-10-CM | POA: Diagnosis not present

## 2019-11-10 DIAGNOSIS — K635 Polyp of colon: Secondary | ICD-10-CM | POA: Diagnosis not present

## 2019-11-10 DIAGNOSIS — G629 Polyneuropathy, unspecified: Secondary | ICD-10-CM | POA: Diagnosis not present

## 2019-11-10 DIAGNOSIS — G4730 Sleep apnea, unspecified: Secondary | ICD-10-CM | POA: Insufficient documentation

## 2019-11-10 DIAGNOSIS — M1990 Unspecified osteoarthritis, unspecified site: Secondary | ICD-10-CM | POA: Diagnosis not present

## 2019-11-10 DIAGNOSIS — D122 Benign neoplasm of ascending colon: Secondary | ICD-10-CM | POA: Insufficient documentation

## 2019-11-10 DIAGNOSIS — F329 Major depressive disorder, single episode, unspecified: Secondary | ICD-10-CM | POA: Diagnosis not present

## 2019-11-10 DIAGNOSIS — K219 Gastro-esophageal reflux disease without esophagitis: Secondary | ICD-10-CM | POA: Diagnosis not present

## 2019-11-10 DIAGNOSIS — Z79899 Other long term (current) drug therapy: Secondary | ICD-10-CM | POA: Insufficient documentation

## 2019-11-10 DIAGNOSIS — K633 Ulcer of intestine: Secondary | ICD-10-CM | POA: Diagnosis not present

## 2019-11-10 DIAGNOSIS — E785 Hyperlipidemia, unspecified: Secondary | ICD-10-CM | POA: Insufficient documentation

## 2019-11-10 DIAGNOSIS — D509 Iron deficiency anemia, unspecified: Secondary | ICD-10-CM

## 2019-11-10 HISTORY — PX: POLYPECTOMY: SHX5525

## 2019-11-10 HISTORY — PX: COLONOSCOPY WITH PROPOFOL: SHX5780

## 2019-11-10 HISTORY — PX: ESOPHAGOGASTRODUODENOSCOPY (EGD) WITH PROPOFOL: SHX5813

## 2019-11-10 SURGERY — COLONOSCOPY WITH PROPOFOL
Anesthesia: General

## 2019-11-10 MED ORDER — ONDANSETRON HCL 4 MG/2ML IJ SOLN: 4.0000 mg | Freq: Once | INTRAMUSCULAR | Status: DC | PRN

## 2019-11-10 MED ORDER — GLYCOPYRROLATE 0.2 MG/ML IJ SOLN
INTRAMUSCULAR | Status: DC | PRN
  Administered 2019-11-10: .1 mg via INTRAVENOUS

## 2019-11-10 MED ORDER — PROPOFOL 10 MG/ML IV BOLUS
INTRAVENOUS | Status: DC | PRN
  Administered 2019-11-10 (×3): 30 mg via INTRAVENOUS
  Administered 2019-11-10: 70 mg via INTRAVENOUS
  Administered 2019-11-10 (×3): 30 mg via INTRAVENOUS

## 2019-11-10 MED ORDER — ACETAMINOPHEN 10 MG/ML IV SOLN: 1000.0000 mg | Freq: Once | INTRAVENOUS | Status: DC | PRN

## 2019-11-10 MED ORDER — LIDOCAINE HCL (CARDIAC) PF 100 MG/5ML IV SOSY
PREFILLED_SYRINGE | INTRAVENOUS | Status: DC | PRN
  Administered 2019-11-10: 40 mg via INTRAVENOUS

## 2019-11-10 MED ORDER — STERILE WATER FOR IRRIGATION IR SOLN
Status: DC | PRN
  Administered 2019-11-10: 11:00:00 50 mL

## 2019-11-10 MED ORDER — LACTATED RINGERS IV SOLN
100.0000 mL/h | INTRAVENOUS | Status: DC
  Administered 2019-11-10: 100 mL/h via INTRAVENOUS

## 2019-11-10 SURGICAL SUPPLY — 15 items
BLOCK BITE 60FR ADLT L/F GRN (MISCELLANEOUS) ×3 IMPLANT
CANISTER SUCT 1200ML W/VALVE (MISCELLANEOUS) ×3 IMPLANT
FCP ESCP3.2XJMB 240X2.8X (MISCELLANEOUS) ×2
FORCEPS BIOP RJ4 240 W/NDL (MISCELLANEOUS) ×1
FORCEPS ESCP3.2XJMB 240X2.8X (MISCELLANEOUS) ×2 IMPLANT
GOWN CVR UNV OPN BCK APRN NK (MISCELLANEOUS) ×4 IMPLANT
GOWN ISOL THUMB LOOP REG UNIV (MISCELLANEOUS) ×2
INJECTOR VARIJECT VIN23 (MISCELLANEOUS) ×2 IMPLANT
KIT ENDO PROCEDURE OLY (KITS) ×3 IMPLANT
MARKER SPOT ENDO TATTOO 5ML (MISCELLANEOUS) ×4 IMPLANT
PROBE APC STR FIRE (PROBE) ×3 IMPLANT
SPOT EX ENDOSCOPIC TATTOO (MISCELLANEOUS) ×2
TRAP ETRAP POLY (MISCELLANEOUS) ×3 IMPLANT
VARIJECT INJECTOR VIN23 (MISCELLANEOUS) ×3
WATER STERILE IRR 250ML POUR (IV SOLUTION) ×3 IMPLANT

## 2019-11-10 NOTE — H&P (Signed)
Jonathon Bellows, MD [address], [place], [place], [place], [address] [address], [place], [place], [place], [address] Phone: [phone]  Fax: [phone]  Primary Care Physician:  [name], MD   Pre-Procedure History & Physical: HPI:  Lisa Good is a 69 y.o. female is here for an endoscopy and colonoscopy    Past Medical History:  Diagnosis Date  . Allergy    seasonal  . Arthritis   . Depression   . GERD (gastroesophageal reflux disease)   . Hyperlipidemia   . Hypertension   . Iron deficiency anemia 11/01/2019  . Neuromuscular disorder ([place])    peripheral neuropathy/feet  . Sleep apnea    CPAP     Past Surgical History:  Procedure Laterality Date  . 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 EXTRACORPOREAL SHOCK WAVE LITHOTRIPSY Right 04/27/2019   Procedure: EXTRACORPOREAL SHOCK WAVE LITHOTRIPSY (ESWL);  Surgeon: Billey Co, MD;  Location: [facility] ORS;  Service: Urology;  Laterality: Right;  . JOINT REPLACEMENT Right    knee  . SPINAL FUSION  07/10/2019   C4-5 corpectomy with C6-7 ACDF, C2-T3 spinal fusion  . SPINE SURGERY     spinal fusion  . TUBAL LIGATION      Prior to Admission medications   Medication Sig Start Date End Date Taking? Authorizing Provider  Alpha-Lipoic Acid 100 MG TABS Take 300 mg by mouth daily.    Yes [provider]  amLODipine (NORVASC) 10 MG tablet Take 10 mg by mouth daily.   Yes [provider]  ARIPiprazole (ABILIFY) 2 MG tablet  10/15/19  Yes [provider]  atorvastatin (LIPITOR) 40 MG tablet Take 40 mg by mouth daily.   Yes [provider]  Buprenorphine HCl (BELBUCA) 600 MCG FILM Place 1 Film inside cheek every 12 (twelve) hours. 08/21/19  Yes Gillis Santa, MD  celecoxib (CELEBREX) 200 MG capsule  08/03/19  Yes [provider]  gabapentin (NEURONTIN) 800 MG tablet Take 1 tablet (800 mg total) by mouth 4 (four) times daily. 06/13/19  Yes Gillis Santa, MD  iron  polysaccharides (NIFEREX) 150 MG capsule Take 1 capsule (150 mg total) by mouth daily. 11/01/19  Yes Earlie Server, MD  lisinopril (PRINIVIL,ZESTRIL) 40 MG tablet Take 40 mg by mouth daily.   Yes [provider]  naloxone (NARCAN) nasal spray 4 mg/0.1 mL Place 4 mg into the nose once. 07/14/19  Yes [provider]  venlafaxine XR (EFFEXOR-XR) 150 MG 24 hr capsule  10/26/19  Yes [provider]  vitamin B-12 (CYANOCOBALAMIN) 1000 MCG tablet Take 1 tablet (1,000 mcg total) by mouth daily. 11/01/19  Yes Earlie Server, MD    Allergies as of 10/19/2019 - Review Complete 10/19/2019  Allergen Reaction Noted  . Contrast media [iodinated diagnostic agents] Hives 11/09/2018  . Cephalexin Nausea Only 01/28/2018    Family History  Problem Relation Age of Onset  . Hypertension Mother   . Hyperlipidemia Sister   . Hypertension Sister   . Depression Sister   . Depression Brother     Social History   Socioeconomic History  . Marital status: Married    Spouse name: Not on file  . Number of children: Not on file  . Years of education: Not on file  . Highest education level: Not on file  Occupational History  . Occupation: retired   Tobacco Use  . Smoking status: Former Smoker    Quit date: 10/29/2004    Years since  quitting: 15.0  . Smokeless tobacco: Never Used  Substance and Sexual Activity  . Alcohol use: Yes    Comment: rarely has a glass of wine  . Drug use: Never  . Sexual activity: Not Currently  Other Topics Concern  . Not on file  Social History Narrative  . Not on file   Social Determinants of Health   Financial Resource Strain: Low Risk   . Difficulty of Paying Living Expenses: Not hard at all  Food Insecurity: No Food Insecurity  . Worried About Charity fundraiser in the Last Year: Never true  . Ran Out of Food in the Last Year: Never true  Transportation Needs: No Transportation Needs  . Lack of Transportation (Medical): No  . Lack of Transportation  (Non-Medical): No  Physical Activity: Unknown  . Days of Exercise per Week: Patient refused  . Minutes of Exercise per Session: Patient refused  Stress: Unknown  . Feeling of Stress : Patient refused  Social Connections: Unknown  . Frequency of Communication with Friends and Family: Patient refused  . Frequency of Social Gatherings with Friends and Family: Patient refused  . Attends Religious Services: Patient refused  . Active Member of Clubs or Organizations: Patient refused  . Attends Archivist Meetings: Patient refused  . Marital Status: Patient refused  Intimate Partner Violence: Not At Risk  . Fear of Current or Ex-Partner: No  . Emotionally Abused: No  . Physically Abused: No  . Sexually Abused: No    Review of Systems: See HPI, otherwise negative ROS  Physical Exam: BP (!) 147/64   Pulse 91   Temp 97.9 F (36.6 C) (Temporal)   Ht 5' 4"  (1.626 m)   Wt 85 kg   SpO2 95%   BMI 32.18 kg/m  General:   Alert,  pleasant and cooperative in NAD Head:  Normocephalic and atraumatic. Neck:  Supple; no masses or thyromegaly. Lungs:  Clear throughout to auscultation, normal respiratory effort.    Heart:  +S1, +S2, Regular rate and rhythm, No edema. Abdomen:  Soft, nontender and nondistended. Normal bowel sounds, without guarding, and without rebound.   Neurologic:  Alert and  oriented x4;  grossly normal neurologically.  Impression/Plan: Lisa Good is here for an endoscopy and colonoscopy  to be performed for  evaluation of iron deficiency anemia    Risks, benefits, limitations, and alternatives regarding endoscopy have been reviewed with the patient.  Questions have been answered.  All parties agreeable.   Jonathon Bellows, MD  11/10/2019, 9:58 AM

## 2019-11-10 NOTE — Op Note (Signed)
[facility] Gastroenterology Patient Name: Lisa Good Procedure Date: 11/10/2019 10:04 AM MRN: [id number] Account #: [id number] Date of Birth: [birth year] Admit Type: Outpatient Age: 69 Room: [facility] OR ROOM 01 Gender: Female Note Status: Finalized Procedure:             Upper GI endoscopy Indications:           Iron deficiency anemia Providers:             Jonathon Bellows MD, MD Referring MD:          Precious Bard, MD (Referring MD) Medicines:             Monitored Anesthesia Care Complications:         No immediate complications. Procedure:             Pre-Anesthesia Assessment:                        - Prior to the procedure, a History and Physical was                         performed, and patient medications, allergies and                         sensitivities were reviewed. The patient's tolerance                         of previous anesthesia was reviewed.                        - The risks and benefits of the procedure and the                         sedation options and risks were discussed with the                         patient. All questions were answered and informed                         consent was obtained.                        - ASA Grade Assessment: III - A patient with severe                         systemic disease.                        After obtaining informed consent, the endoscope was                         passed under direct vision. Throughout the procedure,                         the patient's blood pressure, pulse, and oxygen                         saturations were monitored continuously. The was                         introduced through the mouth, and  advanced to the                         third part of duodenum. The upper GI endoscopy was                         accomplished with ease. The patient tolerated the                         procedure well. Findings:      The examined duodenum was normal. Biopsies for histology  were taken with       a cold forceps for evaluation of celiac disease.      Diffuse moderate inflammation characterized by congestion (edema) and       erythema was found in the gastric antrum. Biopsies were taken with a       cold forceps for histology.      Localized moderately erythematous mucosa without bleeding was found in       the cardia. This was biopsied with a cold forceps for histology.      The esophagus was normal.      The exam was otherwise without abnormality. Impression:            - Normal examined duodenum. Biopsied.                        - Gastritis. Biopsied.                        - Erythematous mucosa in the cardia. Biopsied.                        - Normal esophagus.                        - The examination was otherwise normal. Recommendation:        - Await pathology results.                        - Perform a colonoscopy today. Procedure Code(s):     --- Professional ---                        [id number], Esophagogastroduodenoscopy, flexible,                         transoral; with biopsy, single or multiple Diagnosis Code(s):     --- Professional ---                        K29.70, Gastritis, unspecified, without bleeding                        D50.9, Iron deficiency anemia, unspecified                        K31.89, Other diseases of stomach and duodenum CPT copyright 2019 American Medical Association. All rights reserved. The codes documented in this report are preliminary and upon coder review may  be revised to meet current compliance requirements. Jonathon Bellows, MD Jonathon Bellows MD, MD 11/10/2019 10:20:46 AM This report has been signed electronically. Number of Addenda: 0 Note Initiated On: 11/10/2019 10:04  AM Total Procedure Duration: 0 hours 6 minutes 43 seconds  Estimated Blood Loss:  Estimated blood loss: none.      [facility]

## 2019-11-10 NOTE — Op Note (Signed)
[facility] Gastroenterology Patient Name: Lisa Good Procedure Date: 11/10/2019 10:04 AM MRN: [id number] Account #: [id number] Date of Birth: January 04, 1951 Admit Type: Outpatient Age: 70 Room: [facility] OR ROOM 01 Gender: Female Note Status: Finalized Procedure:             Colonoscopy Indications:           Iron deficiency anemia Providers:             Jonathon Bellows MD, MD Medicines:             Monitored Anesthesia Care Complications:         No immediate complications. Procedure:             Pre-Anesthesia Assessment:                        - ASA Grade Assessment: III - A patient with severe                         systemic disease.                        After obtaining informed consent, the colonoscope was                         passed under direct vision. Throughout the procedure,                         the patient's blood pressure, pulse, and oxygen                         saturations were monitored continuously. The                         Colonoscope was introduced through the anus and                         advanced to the the cecum, identified by the                         appendiceal orifice. The colonoscopy was performed                         with ease. The patient tolerated the procedure well.                         The quality of the bowel preparation was good. Findings:      A 6 mm polyp was found in the distal ascending colon. The polyp was       sessile. The polyp was removed with a cold snare. Resection and       retrieval were complete.      A single (solitary) ten mm ulcer was found at the splenic flexure. No       bleeding was present. Stigmata of recent bleeding were present. Area was       tattooed with an injection of 5 mL of Spot (carbon black). I did not       biopsy as there was some blood on contact      The exam was otherwise without abnormality. Impression:            -  One 6 mm polyp in the distal ascending colon,         removed with a cold snare. Resected and retrieved.                        - A single (solitary) ulcer at the splenic flexure.                         Tattooed.                        - The examination was otherwise normal. Recommendation:        - Discharge patient to home (with escort).                        - Resume previous diet.                        - No ibuprofen, naproxen, or other non-steroidal                         anti-inflammatory drugs.                        - Perform a flexible sigmoidoscopy to check healing in                         6 weeks.                        - Return to GI office as previously scheduled. Procedure Code(s):     --- Professional ---                        [phone], Colonoscopy, flexible; with removal of                         tumor(s), polyp(s), or other lesion(s) by snare                         technique                        [id number], Colonoscopy, flexible; with directed submucosal                         injection(s), any substance Diagnosis Code(s):     --- Professional ---                        K63.5, Polyp of colon                        K63.3, Ulcer of intestine                        D50.9, Iron deficiency anemia, unspecified CPT copyright 2019 American Medical Association. All rights reserved. The codes documented in this report are preliminary and upon coder review may  be revised to meet current compliance requirements. Jonathon Bellows, MD Jonathon Bellows MD, MD 11/10/2019 10:54:32 AM This report has been signed electronically. Number of Addenda: 0 Note Initiated On: 11/10/2019 10:04 AM Scope Withdrawal Time: 0 hours  12 minutes 38 seconds  Total Procedure Duration: 0 hours 20 minutes 51 seconds  Estimated Blood Loss:  Estimated blood loss: none.      [facility]

## 2019-11-10 NOTE — Transfer of Care (Signed)
Immediate Anesthesia Transfer of Care Note  Patient: Lisa Good  Procedure(s) Performed: COLONOSCOPY WITH PROPOFOL (N/A ) ESOPHAGOGASTRODUODENOSCOPY (EGD) WITH PROPOFOL (N/A )  Patient Location: PACU  Anesthesia Type: General  Level of Consciousness: awake, alert  and patient cooperative  Airway and Oxygen Therapy: Patient Spontanous Breathing and Patient connected to supplemental oxygen  Post-op Assessment: Post-op Vital signs reviewed, Patient's Cardiovascular Status Stable, Respiratory Function Stable, Patent Airway and No signs of Nausea or vomiting  Post-op Vital Signs: Reviewed and stable  Complications: No apparent anesthesia complications

## 2019-11-10 NOTE — Anesthesia Postprocedure Evaluation (Signed)
Anesthesia Post Note  Patient: Lisa Good  Procedure(s) Performed: COLONOSCOPY WITH PROPOFOL (N/A ) ESOPHAGOGASTRODUODENOSCOPY (EGD) WITH PROPOFOL (N/A )     Patient location during evaluation: PACU Anesthesia Type: General Level of consciousness: awake and alert Pain management: pain level controlled Vital Signs Assessment: post-procedure vital signs reviewed and stable Respiratory status: spontaneous breathing, nonlabored ventilation, respiratory function stable and patient connected to nasal cannula oxygen Cardiovascular status: blood pressure returned to baseline and stable Postop Assessment: no apparent nausea or vomiting Anesthetic complications: no    Sharnice Bosler A  Rhone Ozaki

## 2019-11-13 ENCOUNTER — Encounter: Payer: Self-pay | Admitting: *Deleted

## 2019-11-14 ENCOUNTER — Encounter: Payer: Self-pay | Admitting: Student in an Organized Health Care Education/Training Program

## 2019-11-15 LAB — SURGICAL PATHOLOGY

## 2019-11-21 ENCOUNTER — Encounter: Payer: Self-pay | Admitting: Student in an Organized Health Care Education/Training Program

## 2019-11-21 ENCOUNTER — Ambulatory Visit
Payer: Medicare Other | Attending: Student in an Organized Health Care Education/Training Program | Admitting: Student in an Organized Health Care Education/Training Program

## 2019-11-21 ENCOUNTER — Other Ambulatory Visit: Payer: Self-pay

## 2019-11-21 DIAGNOSIS — Z981 Arthrodesis status: Secondary | ICD-10-CM

## 2019-11-21 DIAGNOSIS — G894 Chronic pain syndrome: Secondary | ICD-10-CM

## 2019-11-21 DIAGNOSIS — M4802 Spinal stenosis, cervical region: Secondary | ICD-10-CM | POA: Diagnosis not present

## 2019-11-21 DIAGNOSIS — M1712 Unilateral primary osteoarthritis, left knee: Secondary | ICD-10-CM | POA: Diagnosis not present

## 2019-11-21 DIAGNOSIS — M48062 Spinal stenosis, lumbar region with neurogenic claudication: Secondary | ICD-10-CM

## 2019-11-21 DIAGNOSIS — M19011 Primary osteoarthritis, right shoulder: Secondary | ICD-10-CM

## 2019-11-21 DIAGNOSIS — M961 Postlaminectomy syndrome, not elsewhere classified: Secondary | ICD-10-CM

## 2019-11-21 DIAGNOSIS — Z9889 Other specified postprocedural states: Secondary | ICD-10-CM | POA: Diagnosis not present

## 2019-11-21 MED ORDER — BELBUCA 600 MCG BU FILM: 1.0000 | ORAL_FILM | Freq: Two times a day (BID) | BUCCAL | 2 refills | Status: DC

## 2019-11-21 NOTE — Progress Notes (Signed)
Patient: Lisa Good  Service Category: E/M  Provider: Gillis Santa, MD  DOB: 02-15-1951  DOS: 11/21/2019  Location: Office  MRN: [id number]  Setting: Ambulatory outpatient  Referring Provider: No ref. provider found  Type: Established Patient  Specialty: Interventional Pain Management  PCP: Lisa Elk, MD  Location: Car  Delivery: TeleHealth     Virtual Encounter - Pain Management PROVIDER NOTE: Information contained herein reflects review and annotations entered in association with encounter. Interpretation of such information and data should be left to medically-trained personnel. Information provided to patient can be located elsewhere in the medical record under "Patient Instructions". Document created using STT-dictation technology, any transcriptional errors that may result from process are unintentional.    Contact & Pharmacy Preferred: [phone] Home: [phone] (home) Mobile: [phone] (mobile) E-mail: [email or web address]  [name]: [phone]: [phone]  Pre-screening  Lisa Good offered "in-person" vs "virtual" encounter. She indicated preferring virtual for this encounter.   Reason COVID-19*  Social distancing based on CDC and AMA recommendations.   I contacted Lisa Good on 11/21/2019 via telephone.      I clearly identified myself as BGillis Santa MD. I verified that I was speaking with the correct person using two identifiers (Name: Lisa Good and date of birth: 111/30/1952.  This visit was completed via telephone due to the restrictions of the COVID-19 pandemic. All issues as above were discussed and addressed but no physical exam was performed. If it was felt that the patient should be evaluated in the office, they were directed there. The patient verbally consented to this visit. Patient was unable to complete an audio/visual visit due to  Technical difficulties and/or Lack of internet. Due to the catastrophic nature of the COVID-19 pandemic, this visit was done through audio contact only.  Location of the patient: home address (see Epic for details)  Location of the provider: office  Consent I [facility] verbal [facility]. I informed Lisa Good of possible security and privacy concerns, risks, and limitations associated with providing "not-in-person" medical evaluation and management services. I also informed Lisa Good of the availability of "in-person" appointments. Finally, I informed her that there would be a charge for the virtual visit and that she could be  personally, fully or partially, financially responsible for it. Lisa Good understanding and agreed to proceed.   Historic Elements   Lisa Good a 69y.o. year old, female patient evaluated today after her last contact with our practice on 10/26/2019. Lisa Good has a past medical history of Allergy, Arthritis, Depression, GERD (gastroesophageal reflux disease), Hyperlipidemia, Hypertension, Iron deficiency anemia (11/01/2019), Neuromuscular disorder ([place], and Sleep apnea. She also  has a past surgical history that includes Spine surgery; Joint replacement (Right); Tubal ligation; Brain surgery (2002); Extracorporeal shock wave lithotripsy (Right, 04/27/2019); Spinal fusion (07/10/2019); Colonoscopy with propofol (N/A, 11/10/2019); Esophagogastroduodenoscopy (egd) with propofol (N/A, 11/10/2019); and polypectomy (11/10/2019). Lisa Good a current medication list which includes the following prescription(s): alpha-lipoic acid, amlodipine, aripiprazole, atorvastatin, celecoxib, gabapentin, lisinopril, venlafaxine, venlafaxine xr, vitamin b-12, belbuca, iron polysaccharides, and naloxone. She  reports that she quit smoking about 15 years ago. She has never used smokeless tobacco. She reports current alcohol use. She  reports that she does not use drugs. Lisa Good Good to contrast media [iodinated diagnostic agents] and cephalexin.   HPI  Today, she is being contacted  for both, medication management and a post-procedure assessment.   Evaluation of last interventional procedure  10/25/2019 Procedure:  Right posterior glenohumeral shoulder steroid injection, left intra-articular knee steroid injection  Sedation: Please see nurses note for DOS. When no sedatives are used, the analgesic levels obtained are directly associated to the effectiveness of the local anesthetics. However, when sedation is provided, the level of analgesia obtained during the initial 1 hour following the intervention, is believed to be the result of a combination of factors. These factors may include, but are not limited to: 1. The effectiveness of the local anesthetics used. 2. The effects of the analgesic(s) and/or anxiolytic(s) used. 3. The degree of discomfort experienced by the patient at the time of the procedure. 4. The patients ability and reliability in recalling and recording the events. 5. The presence and influence of possible secondary gains and/or psychosocial factors. Reported result: Relief experienced during the 1st hour after the procedure: 100 % (Ultra-Short Term Relief)            Interpretative annotation: Clinically appropriate result. Analgesia during this period is likely to be Local Anesthetic and/or IV Sedative (Analgesic/Anxiolytic) related.          Effects of local anesthetic: The analgesic effects attained during this period are directly associated to the localized infiltration of local anesthetics and therefore cary significant diagnostic value as to the etiological location, or anatomical origin, of the pain. Expected duration of relief is directly dependent on the pharmacodynamics of the local anesthetic used. Long-acting (4-6 hours) anesthetics used.  Reported result: Relief during the next 4 to 6  hour after the procedure: 100 % (Short-Term Relief)            Interpretative annotation: Clinically appropriate result. Analgesia during this period is likely to be Local Anesthetic-related.          Long-term benefit: Defined as the period of time past the expected duration of local anesthetics (1 hour for short-acting and 4-6 hours for long-acting). With the possible exception of prolonged sympathetic blockade from the local anesthetics, benefits during this period are typically attributed to, or associated with, other factors such as analgesic sensory neuropraxia, antiinflammatory effects, or beneficial biochemical changes provided by agents other than the local anesthetics.  Reported result: Extended relief following procedure: 100 %(lasting 3 weeks) (Long-Term Relief)            Interpretative annotation: Clinically appropriate result. Good relief.   Good benefit with left knee steroid injection, return of knee pain last week interested in repeating injection #2.  Patient also obtain benefit with the right shoulder injection however states that it only lasted approximately 2 weeks but did notice significant benefit in pain and range of motion.  Discussed repeating utilizing the anterior approach under fluoroscopy.  Risks and benefits reviewed and patient like to proceed.  Pharmacotherapy Assessment  Analgesic:  11/02/2019  4   08/21/2019  Belbuca 600 MCG Film  60.00  30 Bi Lat   [id number]   Nor (2541)   2  1.20 mg  Medicare   [place]     Monitoring: [place] PMP: PDMP reviewed during this encounter.       Pharmacotherapy: No side-effects or adverse reactions reported. Compliance: No problems identified. Effectiveness: Clinically acceptable. Plan: Refer to "POC".  UDS:  tatus:  Final result  Visible to patient:  Yes (MyChart)  Next appt:  02/23/2020 at 11:15 AM in Oncology ([facility])  Ref Range & Units 6 mo ago  Buprenorphine  1 - 10 ng/mL 2   Comment: This test was developed and its  performance characteristics  determined by [facility]. It has not been cleared or approved  by the Food and Drug Administration.   Norbuprenorphine Not Estab. ng/mL 1   Comment: This test was developed and its performance characteristics  determined by [facility]. It has not been cleared or approved  by the Food and Drug Administration.   Resulting Agency  [facility]        Laboratory Chemistry Profile   Renal Lab Results  Component Value Date   BUN 16 07/22/2019   CREATININE 0.87 07/22/2019   GFRAA >60 07/22/2019   GFRNONAA >60 07/22/2019    Hepatic Lab Results  Component Value Date   AST 20 07/21/2019   ALT 9 07/21/2019   ALBUMIN 2.6 (L) 07/21/2019   ALKPHOS 77 07/21/2019    Electrolytes Lab Results  Component Value Date   NA 138 07/22/2019   K 4.8 07/22/2019   CL 111 07/22/2019   CALCIUM 8.0 (L) 07/22/2019   MG 2.1 07/21/2019   PHOS 3.4 07/21/2019    Bone No results found for: VD25OH, ME268TM1DQQ, IW9798XQ1, JH4174YC1, 25OHVITD1, 25OHVITD2, 25OHVITD3, TESTOFREE, TESTOSTERONE  Coagulation Lab Results  Component Value Date   PLT 223 10/31/2019    Cardiovascular Lab Results  Component Value Date   HGB 12.4 10/31/2019   HCT 41.0 10/31/2019    Inflammation (CRP: Acute Phase) (ESR: Chronic Phase) Lab Results  Component Value Date   LATICACIDVEN 1.4 07/20/2019      Note: Above Lab results reviewed.  Imaging  DG PAIN CLINIC C-ARM 1-60 MIN NO REPORT Fluoro was used, but no Radiologist interpretation will be provided.  Please refer to "NOTES" tab for provider progress note.  Assessment  The primary encounter diagnosis was Primary osteoarthritis of left knee. Diagnoses of Primary osteoarthritis of right shoulder, Hx of cervical spine surgery, Foraminal stenosis of cervical region, Cervical stenosis of spinal canal, Postlaminectomy syndrome of lumbosacral region, History of fusion of lumbar spine, Spinal stenosis of lumbar region with neurogenic claudication, and Chronic  pain syndrome were also pertinent to this visit.  Plan of Care   I am having Timiya Slimp maintain [name], amLODipine, atorvastatin, lisinopril, gabapentin, naloxone, celecoxib, ARIPiprazole, venlafaxine XR, vitamin B-12, iron polysaccharides, venlafaxine, and Belbuca.  1. Refill Belbuca as below, no change in dose 2. Repeat glenohumeral joint injection under fluoroscopy utilizing the anterior approach #2 3. Repeat left knee intra-articular steroid injection #2  Pharmacotherapy (Medications Ordered): Meds ordered this encounter  Medications  . Buprenorphine HCl (BELBUCA) 600 MCG FILM    Sig: Place 1 Film inside cheek every 12 (twelve) hours.    Dispense:  60 each    Refill:  2   Orders:  Orders Placed This Encounter  Procedures  . SHOULDER INJECTION    Standing Status:   Future    Standing Expiration Date:   12/21/2019    Scheduling Instructions:     Side: right     Sedation: Without Sedation.     Timeframe: As soon as schedule allows    Order Specific Question:   Where will this procedure be performed?    Answer:   [facility]    Comments:   Quiera Diffee  . KNEE INJECTION    Local Anesthetic & Steroid injection.    Standing Status:   Future    Standing Expiration Date:   12/21/2019    Scheduling Instructions:     Side: left  Sedation: None     Timeframe: As soon as schedule allows    Order Specific Question:   Where will this procedure be performed?    Answer:   [facility]   Follow-up plan:   Return in about 6 days (around 11/27/2019) for Right shoulder and left knee injection without sedation (30 minutes).       Recent Visits Date Type Provider Dept  10/25/19 Procedure visit Lisa Santa, MD [facility]  Showing recent visits within past 90 days and meeting all other requirements   Today's Visits Date Type Provider Dept  11/21/19 Office Visit Lisa Santa, MD [facility]  Showing today's visits and meeting all  other requirements   Future Appointments No visits were found meeting these conditions.  Showing future appointments within next 90 days and meeting all other requirements   I discussed the assessment and treatment plan with the patient. The patient was provided an opportunity to ask questions and all were answered. The patient agreed with the plan and demonstrated an understanding of the instructions.  Patient advised to call back or seek an in-person evaluation if the symptoms or condition worsens.  Duration of encounter: 25 minutes.  Note by: Lisa Santa, MD Date: 11/21/2019; Time: 1:56 PM

## 2019-11-28 ENCOUNTER — Telehealth: Payer: Self-pay

## 2019-11-28 NOTE — Telephone Encounter (Signed)
Spoke with pt and informed her of biopsy results and Dr. Georgeann Oppenheim recommendations. Pt understands and agrees.

## 2019-11-28 NOTE — Telephone Encounter (Signed)
-----   Message from Jonathon Bellows, MD sent at 11/26/2019 10:22 AM EST ----- Inform  1. Noted gastritis with intestinmal metaplasia - repeat EGD in 6 months for gastric mapping  2. 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 Ulcer was seen on colonoscopy - suggest we repeat procedure in a few months - discuss at follow up   Dr Jonathon Bellows MD,MRCP [facility]) Gastroenterology/Hepatology Pager: [phone]

## 2019-12-04 ENCOUNTER — Ambulatory Visit: Admission: RE | Admit: 2019-12-04 | Payer: Medicare Other | Source: Ambulatory Visit

## 2019-12-04 ENCOUNTER — Ambulatory Visit
Admission: RE | Admit: 2019-12-04 | Discharge: 2019-12-04 | Disposition: A | Discharge status: Home / Self Care | Payer: Medicare Other | Source: Ambulatory Visit | Attending: Student in an Organized Health Care Education/Training Program | Admitting: Student in an Organized Health Care Education/Training Program

## 2019-12-04 ENCOUNTER — Other Ambulatory Visit: Payer: Self-pay

## 2019-12-04 ENCOUNTER — Encounter: Payer: Self-pay | Admitting: Student in an Organized Health Care Education/Training Program

## 2019-12-04 ENCOUNTER — Ambulatory Visit (HOSPITAL_BASED_OUTPATIENT_CLINIC_OR_DEPARTMENT_OTHER): Payer: Medicare Other | Admitting: Student in an Organized Health Care Education/Training Program

## 2019-12-04 VITALS — BP 143/85 | HR 88 | Temp 97.2°F | Resp 12 | Ht 63.0 in | Wt 192.0 lb

## 2019-12-04 DIAGNOSIS — M5416 Radiculopathy, lumbar region: Secondary | ICD-10-CM | POA: Insufficient documentation

## 2019-12-04 DIAGNOSIS — M1712 Unilateral primary osteoarthritis, left knee: Secondary | ICD-10-CM

## 2019-12-04 DIAGNOSIS — G8929 Other chronic pain: Secondary | ICD-10-CM | POA: Insufficient documentation

## 2019-12-04 MED ORDER — METHYLPREDNISOLONE ACETATE 40 MG/ML IJ SUSP
40.0000 mg | Freq: Once | INTRAMUSCULAR | Status: AC
  Administered 2019-12-04: 40 mg via INTRA_ARTICULAR
  Filled 2019-12-04: qty 1

## 2019-12-04 MED ORDER — IOHEXOL 180 MG/ML  SOLN
10.0000 mL | Freq: Once | INTRAMUSCULAR | Status: AC
  Administered 2019-12-04: 10 mL via EPIDURAL
  Filled 2019-12-04: qty 20

## 2019-12-04 MED ORDER — ROPIVACAINE HCL 2 MG/ML IJ SOLN
4.0000 mL | Freq: Once | INTRAMUSCULAR | Status: AC
  Administered 2019-12-04: 4 mL via INTRA_ARTICULAR
  Filled 2019-12-04: qty 10

## 2019-12-04 MED ORDER — DEXAMETHASONE SODIUM PHOSPHATE 10 MG/ML IJ SOLN
10.0000 mg | Freq: Once | INTRAMUSCULAR | Status: AC
  Administered 2019-12-04: 10 mg
  Filled 2019-12-04: qty 1

## 2019-12-04 MED ORDER — SODIUM CHLORIDE 0.9% FLUSH
2.0000 mL | Freq: Once | INTRAVENOUS | Status: AC
  Administered 2019-12-04: 10 mL

## 2019-12-04 MED ORDER — METHYLPREDNISOLONE ACETATE 40 MG/ML IJ SUSP: 40.0000 mg | Freq: Once | INTRAMUSCULAR | Status: DC

## 2019-12-04 MED ORDER — LIDOCAINE HCL 2 % IJ SOLN
20.0000 mL | Freq: Once | INTRAMUSCULAR | Status: AC
  Administered 2019-12-04: 400 mg
  Filled 2019-12-04: qty 20

## 2019-12-04 NOTE — Progress Notes (Signed)
PROVIDER NOTE: Information contained herein reflects review and annotations entered in association with encounter. Interpretation of such information and data should be left to medically-trained personnel. Information provided to patient can be located elsewhere in the medical record under "Patient Instructions". Document created using STT-dictation technology, any transcriptional errors that may result from process are unintentional.    Patient: Lisa Good  Service Category: Procedure  Provider: Gillis Santa, MD  DOB: 04-17-1951  DOS: 12/04/2019  Location: [facility]  MRN: [id number]  Setting: Ambulatory - outpatient  Referring Provider: [name], MD  Type: Established Patient  Specialty: Interventional Pain Management  PCP: [name], MD   Primary Reason for Visit: Interventional Pain Management Treatment. CC: Back Pain (lower 7/10), Shoulder Pain (2/10 States its better), and Knee Pain (5/10 left)  Procedure:          Anesthesia, Analgesia, Anxiolysis:  Type: Therapeutic Intra-Articular Local anesthetic and steroid Knee Injection #2  Region: Medial infrapatellar Knee Region Level: Knee Joint Laterality: Left knee  Type: Local Anesthesia Indication(s): Analgesia         Local Anesthetic: Lidocaine 1-2% Route: Infiltration ([place]/IM) IV Access: Declined Sedation: Declined   Position: Sitting   Indications: Left knee osteoarthritis  Pain Score: Pre-procedure: 6 /10 Post-procedure: 6 /10   Pre-op Assessment:  Lisa Good is a 69 y.o. (year old), female patient, seen today for interventional treatment. She  has a past surgical history that includes Spine surgery; Joint replacement (Right); Tubal ligation; Brain surgery (2002); Extracorporeal shock wave lithotripsy (Right, 04/27/2019); Spinal fusion (07/10/2019); Colonoscopy with propofol (N/A, 11/10/2019); Esophagogastroduodenoscopy (egd) with propofol (N/A, 11/10/2019); and polypectomy (11/10/2019). Ms.  Good has a current medication list which includes the following prescription(s): alpha-lipoic acid, amlodipine, aripiprazole, aripiprazole, atorvastatin, belbuca, celecoxib, gabapentin, lisinopril, naloxone, venlafaxine, venlafaxine xr, vitamin b-12, and iron polysaccharides, and the following Facility-Administered Medications: dexamethasone, iohexol, lidocaine, methylprednisolone acetate, ropivacaine (pf) 2 mg/ml (0.2%), ropivacaine (pf) 2 mg/ml (0.2%), and sodium chloride flush. Her primarily concern today is the Back Pain (lower 7/10), Shoulder Pain (2/10 States its better), and Knee Pain (5/10 left)  Initial Vital Signs:  Pulse/HCG Rate: 88  Temp: (!) 97.2 F (36.2 C) Resp: 16 BP: (!) 135/96 SpO2: 98 %  BMI: Estimated body mass index is 34.01 kg/m as calculated from the following:   Height as of this encounter: 5' 3"  (1.6 m).   Weight as of this encounter: 192 lb (87.1 kg).  Risk Assessment: Allergies: Reviewed. She is allergic to contrast media [iodinated diagnostic agents] and cephalexin.  Allergy Precautions: None required Coagulopathies: Reviewed. None identified.  Blood-thinner therapy: None at this time Active Infection(s): Reviewed. None identified. Lisa Good is afebrile  Site Confirmation: Lisa Good was asked to confirm the procedure and laterality before marking the site Procedure checklist: Completed Consent: Before the procedure and under the influence of no sedative(s), amnesic(s), or anxiolytics, the patient was informed of the treatment options, risks and possible complications. To fulfill our ethical and legal obligations, as recommended by the American Medical Association's Code of Ethics, I have informed the patient of my clinical impression; the nature and purpose of the treatment or procedure; the risks, benefits, and possible complications of the intervention; the alternatives, including doing nothing; the risk(s) and benefit(s) of the alternative treatment(s) or  procedure(s); and the risk(s) and benefit(s) of doing nothing. The patient was provided information about the general risks and possible complications associated with the procedure. These may include, but are not limited to: failure to  achieve desired goals, infection, bleeding, organ or nerve damage, allergic reactions, paralysis, and death. In addition, the patient was informed of those risks and complications associated to the procedure, such as failure to decrease pain; infection; bleeding; organ or nerve damage with subsequent damage to sensory, motor, and/or autonomic systems, resulting in permanent pain, numbness, and/or weakness of one or several areas of the body; allergic reactions; (i.e.: anaphylactic reaction); and/or death. Furthermore, the patient was informed of those risks and complications associated with the medications. These include, but are not limited to: allergic reactions (i.e.: anaphylactic or anaphylactoid reaction(s)); adrenal axis suppression; blood sugar elevation that in diabetics may result in ketoacidosis or comma; water retention that in patients with history of congestive heart failure may result in shortness of breath, pulmonary edema, and decompensation with resultant heart failure; weight gain; swelling or edema; medication-induced neural toxicity; particulate matter embolism and blood vessel occlusion with resultant organ, and/or nervous system infarction; and/or aseptic necrosis of one or more joints. Finally, the patient was informed that Medicine is not an exact science; therefore, there is also the possibility of unforeseen or unpredictable risks and/or possible complications that may result in a catastrophic outcome. The patient indicated having understood very clearly. We have given the patient no guarantees and we have made no promises. Enough time was given to the patient to ask questions, all of which were answered to the patient's satisfaction. Lisa Good has  indicated that she wanted to continue with the procedure. Attestation: I, the ordering provider, attest that I have discussed with the patient the benefits, risks, side-effects, alternatives, likelihood of achieving goals, and potential problems during recovery for the procedure that I have provided informed consent. Date  Time: 12/04/2019  9:58 AM  Pre-Procedure Preparation:  Monitoring: As per clinic protocol. Respiration, ETCO2, SpO2, BP, heart rate and rhythm monitor placed and checked for adequate function Safety Precautions: Patient was assessed for positional comfort and pressure points before starting the procedure. Time-out: I initiated and conducted the "Time-out" before starting the procedure, as per protocol. The patient was asked to participate by confirming the accuracy of the "Time Out" information. Verification of the correct person, site, and procedure were performed and confirmed by me, the nursing staff, and the patient. "Time-out" conducted as per Joint Commission's Universal Protocol (UP.01.01.01). Time:    Description of Procedure:          Target Area: Knee Joint Approach: Just above the Medial tibial plateau, lateral to the infrapatellar tendon. Area Prepped: Entire knee area, from the mid-thigh to the mid-shin. Prepping solution: DuraPrep (Iodine Povacrylex [0.7% available iodine] and Isopropyl Alcohol, 74% w/w) Safety Precautions: Aspiration looking for blood return was conducted prior to all injections. At no point did we inject any substances, as a needle was being advanced. No attempts were made at seeking any paresthesias. Safe injection practices and needle disposal techniques used. Medications properly checked for expiration dates. SDV (single dose vial) medications used. Description of the Procedure: Protocol guidelines were followed. The patient was placed in position over the fluoroscopy table. The target area was identified and the area prepped in the usual manner.  Skin & deeper tissues infiltrated with local anesthetic. Appropriate amount of time allowed to pass for local anesthetics to take effect. The procedure needles were then advanced to the target area. Proper needle placement secured. Negative aspiration confirmed. Solution injected in intermittent fashion, asking for systemic symptoms every 0.5cc of injectate. The needles were then removed and the area cleansed, making sure  to leave some of the prepping solution back to take advantage of its long term bactericidal properties. Vitals:   12/04/19 1000  BP: (!) 135/96  Pulse: 88  Resp: 16  Temp: (!) 97.2 F (36.2 C)  SpO2: 98%  Weight: 192 lb (87.1 kg)  Height: 5' 3"  (1.6 m)    Start Time:   hrs. End Time:   hrs. Materials:  Needle(s) Type: Regular needle Gauge: 25G Length: 1.5-in Medication(s): Please see orders for medications and dosing details. 4 cc solution made of 3 cc of 0.2% ropivacaine, 1 cc of methylprednisolone, 40 mg/cc. Imaging Guidance:          Type of Imaging Technique: None used Indication(s): N/A Exposure Time: No patient exposure Contrast: None used. Fluoroscopic Guidance: N/A Ultrasound Guidance: N/A Interpretation: N/A  Antibiotic Prophylaxis:   Anti-infectives (From admission, onward)   None     Indication(s): None identified  Post-operative Assessment:  Post-procedure Vital Signs:  Pulse/HCG Rate: 88  Temp: (!) 97.2 F (36.2 C) Resp: 16 BP: (!) 135/96 SpO2: 98 %  EBL: None  Complications: No immediate post-treatment complications observed by team, or reported by patient.  Note: The patient tolerated the entire procedure well. A repeat set of vitals were taken after the procedure and the patient was kept under observation following institutional policy, for this type of procedure. Post-procedural neurological assessment was performed, showing return to baseline, prior to discharge. The patient was provided with post-procedure discharge instructions,  including a section on how to identify potential problems. Should any problems arise concerning this procedure, the patient was given instructions to immediately contact us, at any time, without hesitation. In any case, we plan to contact the patient by telephone for a follow-up status report regarding this interventional procedure.  Comments:  No additional relevant information.  Plan of Care  Orders:  Orders Placed This Encounter  Procedures  . [facility] C-ARM 1-60 MIN NO REPORT    Intraoperative interpretation by procedural physician at [place].    Standing Status:   Standing    Number of Occurrences:   1    Order Specific Question:   Reason for exam:    Answer:   Assistance in needle guidance and placement for procedures requiring needle placement in or near specific anatomical locations not easily accessible without such assistance.   Medications ordered for procedure: Meds ordered this encounter  Medications  . iohexol (OMNIPAQUE) 180 MG/ML injection 10 mL    Must be Myelogram-compatible. If not available, you may substitute with a water-soluble, non-ionic, hypoallergenic, myelogram-compatible radiological contrast medium.  Marland Kitchen lidocaine (XYLOCAINE) 2 % (with pres) injection 400 mg  . ropivacaine (PF) 2 mg/mL (0.2%) (NAROPIN) injection 4 mL  . DISCONTD: methylPREDNISolone acetate (DEPO-MEDROL) injection 40 mg  . ropivacaine (PF) 2 mg/mL (0.2%) (NAROPIN) injection 4 mL  . methylPREDNISolone acetate (DEPO-MEDROL) injection 40 mg  . sodium chloride flush (NS) 0.9 % injection 2 mL  . dexamethasone (DECADRON) injection 10 mg   Medications administered: Phallon Sparling had no medications administered during this visit.  See the medical record for exact dosing, route, and time of administration.  Follow-up plan:   No follow-ups on file.    Recent Visits Date Type Provider Dept  11/21/19 Office Visit Gillis Santa, MD [facility]  10/25/19 Procedure visit  Gillis Santa, MD [facility]  Showing recent visits within past 90 days and meeting all other requirements   Today's Visits Date Type Provider Dept  12/04/19  Procedure visit Gillis Santa, MD [facility]  Showing today's visits and meeting all other requirements   Future Appointments Date Type Provider Dept  02/06/20 Appointment Gillis Santa, MD [facility]  Showing future appointments within next 90 days and meeting all other requirements   Disposition: Discharge home  Discharge (Date  Time): 12/04/2019;   hrs.   Primary Care Physician: [name], MD Location: [facility] Note by: Gillis Santa, MD Date: 12/04/2019; Time: 10:39 AM  Disclaimer:  Medicine is not an exact science. The only guarantee in medicine is that nothing is guaranteed. It is important to note that the decision to proceed with this intervention was based on the information collected from the patient. The Data and conclusions were drawn from the patient's questionnaire, the interview, and the physical examination. Because the information was provided in large part by the patient, it cannot be guaranteed that it has not been purposely or unconsciously manipulated. Every effort has been made to obtain as much relevant data as possible for this evaluation. It is important to note that the conclusions that lead to this procedure are derived in large part from the available data. Always take into account that the treatment will also be dependent on availability of resources and existing treatment guidelines, considered by other Pain Management Practitioners as being common knowledge and practice, at the time of the intervention. For Medico-Legal purposes, it is also important to point out that variation in procedural techniques and pharmacological choices are the acceptable norm. The indications, contraindications, technique, and results of the above procedure  should only be interpreted and judged by a Board-Certified Interventional Pain Specialist with extensive familiarity and expertise in the same exact procedure and technique.

## 2019-12-04 NOTE — Patient Instructions (Signed)

## 2019-12-04 NOTE — Progress Notes (Signed)
PROVIDER NOTE: Information contained herein reflects review and annotations entered in association with encounter. Interpretation of such information and data should be left to medically-trained personnel. Information provided to patient can be located elsewhere in the medical record under "Patient Instructions". Document created using STT-dictation technology, any transcriptional errors that may result from process are unintentional.    Patient: Lisa Good  Service Category: Procedure  Provider: Gillis Santa, MD  DOB: 1951/08/02  DOS: 12/04/2019  Location: [facility]  MRN: [id number]  Setting: Ambulatory - outpatient  Referring Provider: [name], MD  Type: Established Patient  Specialty: Interventional Pain Management  PCP: [name], MD   Primary Reason for Visit: Interventional Pain Management Treatment. CC: Back Pain (lower 7/10), Shoulder Pain (2/10 States its better), and Knee Pain (5/10 left)  Procedure:          Anesthesia, Analgesia, Anxiolysis:  Type: Diagnostic Epidural Steroid Injection #1  Region: Caudal Level: Sacrococcygeal   Laterality: Midline       Type: Local Anesthesia  Local Anesthetic: Lidocaine 1-2%  Position: Prone   Indications: 1. Chronic radicular lumbar pain   2. Primary osteoarthritis of left knee    Pain Score: Pre-procedure: 6 /10 Post-procedure: 3 /10   Pre-op Assessment:  Lisa Good is a 69 y.o. (year old), female patient, seen today for interventional treatment. She  has a past surgical history that includes Spine surgery; Joint replacement (Right); Tubal ligation; Brain surgery (2002); Extracorporeal shock wave lithotripsy (Right, 04/27/2019); Spinal fusion (07/10/2019); Colonoscopy with propofol (N/A, 11/10/2019); Esophagogastroduodenoscopy (egd) with propofol (N/A, 11/10/2019); and polypectomy (11/10/2019). Lisa Good has a current medication list which includes the following prescription(s): alpha-lipoic acid,  amlodipine, aripiprazole, aripiprazole, atorvastatin, belbuca, celecoxib, gabapentin, lisinopril, naloxone, venlafaxine, venlafaxine xr, vitamin b-12, and iron polysaccharides. Her primarily concern today is the Back Pain (lower 7/10), Shoulder Pain (2/10 States its better), and Knee Pain (5/10 left)  Initial Vital Signs:  Pulse/HCG Rate: 88ECG Heart Rate: 88 Temp: (!) 97.2 F (36.2 C) Resp: 16 BP: (!) 135/96 SpO2: 98 %  BMI: Estimated body mass index is 34.01 kg/m as calculated from the following:   Height as of this encounter: 5' 3"  (1.6 m).   Weight as of this encounter: 192 lb (87.1 kg).  Risk Assessment: Allergies: Reviewed. She is allergic to contrast media [iodinated diagnostic agents] and cephalexin.  Allergy Precautions: None required Coagulopathies: Reviewed. None identified.  Blood-thinner therapy: None at this time Active Infection(s): Reviewed. None identified. Lisa Good is afebrile  Site Confirmation: Lisa Good was asked to confirm the procedure and laterality before marking the site Procedure checklist: Completed Consent: Before the procedure and under the influence of no sedative(s), amnesic(s), or anxiolytics, the patient was informed of the treatment options, risks and possible complications. To fulfill our ethical and legal obligations, as recommended by the American Medical Association's Code of Ethics, I have informed the patient of my clinical impression; the nature and purpose of the treatment or procedure; the risks, benefits, and possible complications of the intervention; the alternatives, including doing nothing; the risk(s) and benefit(s) of the alternative treatment(s) or procedure(s); and the risk(s) and benefit(s) of doing nothing. The patient was provided information about the general risks and possible complications associated with the procedure. These may include, but are not limited to: failure to achieve desired goals, infection, bleeding, organ or nerve  damage, allergic reactions, paralysis, and death. In addition, the patient was informed of those risks and complications associated to Spine-related procedures, such  as failure to decrease pain; infection (i.e.: Meningitis, epidural or intraspinal abscess); bleeding (i.e.: epidural hematoma, subarachnoid hemorrhage, or any other type of intraspinal or peri-dural bleeding); organ or nerve damage (i.e.: Any type of peripheral nerve, nerve root, or spinal cord injury) with subsequent damage to sensory, motor, and/or autonomic systems, resulting in permanent pain, numbness, and/or weakness of one or several areas of the body; allergic reactions; (i.e.: anaphylactic reaction); and/or death. Furthermore, the patient was informed of those risks and complications associated with the medications. These include, but are not limited to: allergic reactions (i.e.: anaphylactic or anaphylactoid reaction(s)); adrenal axis suppression; blood sugar elevation that in diabetics may result in ketoacidosis or comma; water retention that in patients with history of congestive heart failure may result in shortness of breath, pulmonary edema, and decompensation with resultant heart failure; weight gain; swelling or edema; medication-induced neural toxicity; particulate matter embolism and blood vessel occlusion with resultant organ, and/or nervous system infarction; and/or aseptic necrosis of one or more joints. Finally, the patient was informed that Medicine is not an exact science; therefore, there is also the possibility of unforeseen or unpredictable risks and/or possible complications that may result in a catastrophic outcome. The patient indicated having understood very clearly. We have given the patient no guarantees and we have made no promises. Enough time was given to the patient to ask questions, all of which were answered to the patient's satisfaction. Lisa Good has indicated that she wanted to continue with the  procedure. Attestation: I, the ordering provider, attest that I have discussed with the patient the benefits, risks, side-effects, alternatives, likelihood of achieving goals, and potential problems during recovery for the procedure that I have provided informed consent. Date  Time: 12/04/2019  9:58 AM  Pre-Procedure Preparation:  Monitoring: As per clinic protocol. Respiration, ETCO2, SpO2, BP, heart rate and rhythm monitor placed and checked for adequate function Safety Precautions: Patient was assessed for positional comfort and pressure points before starting the procedure. Time-out: I initiated and conducted the "Time-out" before starting the procedure, as per protocol. The patient was asked to participate by confirming the accuracy of the "Time Out" information. Verification of the correct person, site, and procedure were performed and confirmed by me, the nursing staff, and the patient. "Time-out" conducted as per Joint Commission's Universal Protocol (UP.01.01.01). Time: 1048  Description of Procedure:          Target Area: Caudal Epidural Canal. Approach: Midline approach. Area Prepped: Entire Posterior Sacrococcygeal Region Prepping solution: DuraPrep (Iodine Povacrylex [0.7% available iodine] and Isopropyl Alcohol, 74% w/w) Safety Precautions: Aspiration looking for blood return was conducted prior to all injections. At no point did we inject any substances, as a needle was being advanced. No attempts were made at seeking any paresthesias. Safe injection practices and needle disposal techniques used. Medications properly checked for expiration dates. SDV (single dose vial) medications used. Description of the Procedure: Protocol guidelines were followed. The patient was placed in position over the fluoroscopy table. The target area was identified and the area prepped in the usual manner. Skin & deeper tissues infiltrated with local anesthetic. Appropriate amount of time allowed to pass for  local anesthetics to take effect. The procedure needles were then advanced to the target area. Proper needle placement secured. Negative aspiration confirmed. Solution injected in intermittent fashion, asking for systemic symptoms every 0.5cc of injectate. The needles were then removed and the area cleansed, making sure to leave some of the prepping solution back to take advantage of  its long term bactericidal properties. Vitals:   12/04/19 1000 12/04/19 1050 12/04/19 1052 12/04/19 1054  BP: (!) 135/96 139/87 138/84 (!) 143/85  Pulse: 88     Resp: 16 14 12 12   Temp: (!) 97.2 F (36.2 C)     SpO2: 98% 96% 96% 96%  Weight: 192 lb (87.1 kg)     Height: 5' 3"  (1.6 m)       Start Time: 1049 hrs. End Time: 1053 hrs. Materials:  Needle(s) Type: Epidural needle Gauge: 22G Length: 3.5-in Medication(s): Please see orders for medications and dosing details. 5 cc solution made of 2 cc of preservative-free saline, 2 cc of 0.2% ropivacaine, 1 cc of Decadron 10 mg/cc.  Imaging Guidance (Spinal):          Type of Imaging Technique: Fluoroscopy Guidance (Spinal) Indication(s): Assistance in needle guidance and placement for procedures requiring needle placement in or near specific anatomical locations not easily accessible without such assistance. Exposure Time: Please see nurses notes. Contrast: Before injecting any contrast, we confirmed that the patient did not have an allergy to iodine, shellfish, or radiological contrast. Once satisfactory needle placement was completed at the desired level, radiological contrast was injected. Contrast injected under live fluoroscopy. No contrast complications. See chart for type and volume of contrast used. Fluoroscopic Guidance: I was personally present during the use of fluoroscopy. "Tunnel Vision Technique" used to obtain the best possible view of the target area. Parallax error corrected before commencing the procedure. "Direction-depth-direction" technique used  to introduce the needle under continuous pulsed fluoroscopy. Once target was reached, antero-posterior, oblique, and lateral fluoroscopic projection used confirm needle placement in all planes. Images permanently stored in EMR. Interpretation: I personally interpreted the imaging intraoperatively. Adequate needle placement confirmed in multiple planes. Appropriate spread of contrast into desired area was observed. No evidence of afferent or efferent intravascular uptake. No intrathecal or subarachnoid spread observed. Permanent images saved into the patient's record.  Antibiotic Prophylaxis:   Anti-infectives (From admission, onward)   None     Indication(s): None identified  Post-operative Assessment:  Post-procedure Vital Signs:  Pulse/HCG Rate: 8889 Temp: (!) 97.2 F (36.2 C) Resp: 12 BP: (!) 143/85 SpO2: 96 %  EBL: None  Complications: No immediate post-treatment complications observed by team, or reported by patient.  Note: The patient tolerated the entire procedure well. A repeat set of vitals were taken after the procedure and the patient was kept under observation following institutional policy, for this type of procedure. Post-procedural neurological assessment was performed, showing return to baseline, prior to discharge. The patient was provided with post-procedure discharge instructions, including a section on how to identify potential problems. Should any problems arise concerning this procedure, the patient was given instructions to immediately contact us, at any time, without hesitation. In any case, we plan to contact the patient by telephone for a follow-up status report regarding this interventional procedure.  Comments:  No additional relevant information.  Plan of Care  Orders:  Orders Placed This Encounter  Procedures  . DG PAIN [facility] C-ARM 1-60 MIN NO REPORT    Intraoperative interpretation by procedural physician at [place].    Standing Status:    Standing    Number of Occurrences:   1    Order Specific Question:   Reason for exam:    Answer:   Assistance in needle guidance and placement for procedures requiring needle placement in or near specific anatomical locations not easily accessible without such assistance.   Medications  ordered for procedure: Meds ordered this encounter  Medications  . iohexol (OMNIPAQUE) 180 MG/ML injection 10 mL    Must be Myelogram-compatible. If not available, you may substitute with a water-soluble, non-ionic, hypoallergenic, myelogram-compatible radiological contrast medium.  Marland Kitchen lidocaine (XYLOCAINE) 2 % (with pres) injection 400 mg  . ropivacaine (PF) 2 mg/mL (0.2%) (NAROPIN) injection 4 mL  . DISCONTD: methylPREDNISolone acetate (DEPO-MEDROL) injection 40 mg  . ropivacaine (PF) 2 mg/mL (0.2%) (NAROPIN) injection 4 mL  . methylPREDNISolone acetate (DEPO-MEDROL) injection 40 mg  . sodium chloride flush (NS) 0.9 % injection 2 mL  . dexamethasone (DECADRON) injection 10 mg   Medications administered: We administered iohexol, lidocaine, ropivacaine (PF) 2 mg/mL (0.2%), ropivacaine (PF) 2 mg/mL (0.2%), methylPREDNISolone acetate, sodium chloride flush, and dexamethasone.  See the medical record for exact dosing, route, and time of administration.  Follow-up plan:   Return for Keep sch. appt.     Recent Visits Date Type Provider Dept  11/21/19 Office Visit Gillis Santa, MD [facility]  10/25/19 Procedure visit Gillis Santa, MD [facility]  Showing recent visits within past 90 days and meeting all other requirements   Today's Visits Date Type Provider Dept  12/04/19 Procedure visit Gillis Santa, MD [facility]  Showing today's visits and meeting all other requirements   Future Appointments Date Type Provider Dept  02/06/20 Appointment Gillis Santa, MD [facility]  Showing future appointments within next 90 days and meeting all other requirements    Disposition: Discharge home  Discharge (Date  Time): 12/04/2019; 1105 hrs.   Primary Care Physician: [name], MD Location: [facility] Note by: Gillis Santa, MD Date: 12/04/2019; Time: 12:40 PM  Disclaimer:  Medicine is not an exact science. The only guarantee in medicine is that nothing is guaranteed. It is important to note that the decision to proceed with this intervention was based on the information collected from the patient. The Data and conclusions were drawn from the patient's questionnaire, the interview, and the physical examination. Because the information was provided in large part by the patient, it cannot be guaranteed that it has not been purposely or unconsciously manipulated. Every effort has been made to obtain as much relevant data as possible for this evaluation. It is important to note that the conclusions that lead to this procedure are derived in large part from the available data. Always take into account that the treatment will also be dependent on availability of resources and existing treatment guidelines, considered by other Pain Management Practitioners as being common knowledge and practice, at the time of the intervention. For Medico-Legal purposes, it is also important to point out that variation in procedural techniques and pharmacological choices are the acceptable norm. The indications, contraindications, technique, and results of the above procedure should only be interpreted and judged by a Board-Certified Interventional Pain Specialist with extensive familiarity and expertise in the same exact procedure and technique.

## 2019-12-05 ENCOUNTER — Telehealth: Payer: Self-pay

## 2019-12-05 NOTE — Telephone Encounter (Signed)
Patient states after numbness wore off that she had some pain, but states it is feeling better this better. Instructed that it could take 3-10 for steroid. To work. Patient with understanding.

## 2019-12-12 LAB — HM DEXA SCAN: HM Dexa Scan: NORMAL

## 2019-12-18 ENCOUNTER — Telehealth: Payer: Self-pay | Admitting: Student in an Organized Health Care Education/Training Program

## 2019-12-18 ENCOUNTER — Encounter: Payer: Self-pay | Admitting: Student in an Organized Health Care Education/Training Program

## 2019-12-18 NOTE — Telephone Encounter (Signed)
Scheduled patient for 11 am Tues. She will need Nurse chart call

## 2019-12-18 NOTE — Progress Notes (Signed)
Patient is having Left knee replacement by Dr Marry Guan

## 2019-12-18 NOTE — Telephone Encounter (Signed)
Called patient and updated chart.

## 2019-12-19 ENCOUNTER — Other Ambulatory Visit: Payer: Self-pay

## 2019-12-19 ENCOUNTER — Encounter: Payer: Self-pay | Admitting: Student in an Organized Health Care Education/Training Program

## 2019-12-19 ENCOUNTER — Ambulatory Visit
Payer: Medicare Other | Attending: Student in an Organized Health Care Education/Training Program | Admitting: Student in an Organized Health Care Education/Training Program

## 2019-12-19 DIAGNOSIS — M48062 Spinal stenosis, lumbar region with neurogenic claudication: Secondary | ICD-10-CM

## 2019-12-19 DIAGNOSIS — M961 Postlaminectomy syndrome, not elsewhere classified: Secondary | ICD-10-CM

## 2019-12-19 DIAGNOSIS — Z981 Arthrodesis status: Secondary | ICD-10-CM

## 2019-12-19 DIAGNOSIS — Z9889 Other specified postprocedural states: Secondary | ICD-10-CM

## 2019-12-19 DIAGNOSIS — M4802 Spinal stenosis, cervical region: Secondary | ICD-10-CM

## 2019-12-19 DIAGNOSIS — G894 Chronic pain syndrome: Secondary | ICD-10-CM

## 2019-12-19 DIAGNOSIS — M19011 Primary osteoarthritis, right shoulder: Secondary | ICD-10-CM

## 2019-12-19 DIAGNOSIS — M1712 Unilateral primary osteoarthritis, left knee: Secondary | ICD-10-CM

## 2019-12-19 MED ORDER — OXYCODONE-ACETAMINOPHEN 10-325 MG PO TABS: 1.0000 | ORAL_TABLET | ORAL | 0 refills | Status: DC | PRN

## 2019-12-19 NOTE — Progress Notes (Signed)
Patient: Lisa Good  Service Category: E/M  Provider: Gillis Santa, MD  DOB: [birth year]  DOS: 12/19/2019  Location: Office  MRN: [id number]  Setting: Ambulatory outpatient  Referring Provider: [name], MD  Type: Established Patient  Specialty: Interventional Pain Management  PCP: [name], MD  Location: Home  Delivery: TeleHealth     Virtual Encounter - Pain Management PROVIDER NOTE: Information contained herein reflects review and annotations entered in association with encounter. Interpretation of such information and data should be left to medically-trained personnel. Information provided to patient can be located elsewhere in the medical record under "Patient Instructions". Document created using STT-dictation technology, any transcriptional errors that may result from process are unintentional.    Contact & Pharmacy Preferred: [phone] Home: [phone] (home) Mobile: [phone] (mobile) E-mail: [email or web address]  [name]Phone: [phone]: [phone]  Pre-screening  Ms. [name] offered "in-person" vs "virtual" encounter. She indicated preferring virtual for this encounter.   Reason COVID-19*  Social distancing based on CDC and AMA recommendations.   I contacted Marji Paglia on 12/19/2019 via telephone.      I clearly identified myself as BGillis Santa MD. I verified that I was speaking with the correct person using two identifiers (Name: Lisa Good and date of birth: [birth year].  This visit was completed via telephone due to the restrictions of the COVID-19 pandemic. All issues as above were discussed and addressed but no physical exam was performed. If it was felt that the patient should be evaluated in the office, they were directed there. The patient verbally consented to this visit. Patient was unable to complete an audio/visual visit due to  Technical difficulties and/or Lack of internet. Due to the catastrophic nature of the COVID-19 pandemic, this visit was done through audio contact only.  Location of the patient: home address (see Epic for details)  Location of the provider: office  Consent I [facility] verbal [facility]. I informed [name] of possible security and privacy concerns, risks, and limitations associated with providing "not-in-person" medical evaluation and management services. I also informed [name] of the availability of "in-person" appointments. Finally, I informed her that there would be a charge for the virtual visit and that she could be  personally, fully or partially, financially responsible for it. Ms. DMcginnisexpressed understanding and agreed to proceed.   Historic Elements   [name] a 69y.o. year old, female patient evaluated today after her last contact with our practice on 12/18/2019. Lisa Good has a past medical history of Allergy, Arthritis, Depression, GERD (gastroesophageal reflux disease), Hyperlipidemia, Hypertension, Iron deficiency anemia (11/01/2019), Neuromuscular disorder ([place], and Sleep apnea. She also  has a past surgical history that includes Spine surgery; Joint replacement (Right); Tubal ligation; Brain surgery (2002); Extracorporeal shock wave lithotripsy (Right, 04/27/2019); Spinal fusion (07/10/2019); Colonoscopy with propofol (N/A, 11/10/2019); Esophagogastroduodenoscopy (egd) with propofol (N/A, 11/10/2019); and polypectomy (11/10/2019). Ms. DSchlerethhas a current medication list which includes the following prescription(s): alpha-lipoic acid, amlodipine, aripiprazole, atorvastatin, celecoxib, vitamin d3, gabapentin, lisinopril, naloxone, venlafaxine xr, vitamin b-12, aripiprazole, [START ON 12/27/2019] oxycodone-acetaminophen, [START ON 01/26/2020] oxycodone-acetaminophen, and venlafaxine. She  reports that she quit smoking about  15 years ago. She has never used smokeless tobacco. She reports current alcohol use. She reports that she does not use drugs. Lisa Good allergic to contrast media [iodinated diagnostic agents] and cephalexin.  HPI  Today, she is being contacted for medication management.  Patient is scheduled for left knee replacement surgery on 01/12/2020 with Dr. Marry Guan.  She is currently on belbuca for management of her chronic pain.  I will have the patient transition to oxycodone next week and discontinue her belbuca prior to her knee surgery.  As we did for her previous cervical spine surgery, she will continue oxycodone postoperatively for her acute pain management.  After her acute pain from her left knee replacement has improved, we will transition back to Belbuca at previous dose.  Pharmacotherapy Assessment  Analgesic: 11/30/2019  4   11/21/2019  Belbuca 600 MCG Film  60.00  30 Bi Lat   [id number]   Nor (2541)   0  1.20 mg  Medicare   [place]   Monitoring: [place] PMP: PDMP reviewed during this encounter.       Pharmacotherapy: No side-effects or adverse reactions reported. Compliance: No problems identified. Effectiveness: Clinically acceptable. Plan: Refer to "POC".  UDS:    Ref Range & Units 7 mo ago  Buprenorphine 1 - 10 ng/mL 2   Comment: This test was developed and its performance characteristics  determined by [facility]. It has not been cleared or approved  by the Food and Drug Administration.   Norbuprenorphine Not Estab. ng/mL 1   Comment: This test was developed and its performance characteristics  determined by [facility]. It has not been cleared or approved  by the Food and Drug Administration.   Resulting Agency  [facility]    Narrative Performed by: Maryan Puls Performed at: [address]  [address], [place], [place] [id number]  Lab Director: Rush Farmer MD, Phone: [phone]    Laboratory Chemistry Profile   Renal Lab Results  Component Value Date   BUN 16 07/22/2019    CREATININE 0.87 07/22/2019   GFRAA >60 07/22/2019   GFRNONAA >60 07/22/2019    Hepatic Lab Results  Component Value Date   AST 20 07/21/2019   ALT 9 07/21/2019   ALBUMIN 2.6 (L) 07/21/2019   ALKPHOS 77 07/21/2019    Electrolytes Lab Results  Component Value Date   NA 138 07/22/2019   K 4.8 07/22/2019   CL 111 07/22/2019   CALCIUM 8.0 (L) 07/22/2019   MG 2.1 07/21/2019   PHOS 3.4 07/21/2019    Bone No results found for: VD25OH, VD125OH2TOT, FX8329VB1, YO0600KH9, 25OHVITD1, 25OHVITD2, 25OHVITD3, TESTOFREE, TESTOSTERONE  Inflammation (CRP: Acute Phase) (ESR: Chronic Phase) Lab Results  Component Value Date   LATICACIDVEN 1.4 07/20/2019      Note: Above Lab results reviewed.   Assessment  The primary encounter diagnosis was Primary osteoarthritis of left knee. Diagnoses of Primary osteoarthritis of right shoulder, Hx of cervical spine surgery, Foraminal stenosis of cervical region, Cervical stenosis of spinal canal, Postlaminectomy syndrome of lumbosacral region, History of fusion of lumbar spine, Spinal stenosis of lumbar region with neurogenic claudication, and Chronic pain syndrome were also pertinent to this visit.  Plan of Care   Ms. Roshawn Dargan has a current medication list which includes the following long-term medication(s): amlodipine, aripiprazole, atorvastatin, gabapentin, lisinopril, and aripiprazole.   Discontinue belbuca and start oxycodone 10 mg every 4-6 hours as needed for perioperative pain pain management.  Will transition back to [facility] after patient's acute postsurgical pain has improved.  Pharmacotherapy (Medications Ordered): Meds ordered this encounter  Medications  . oxyCODONE-acetaminophen (PERCOCET) 10-325 MG tablet    Sig: Take 1 tablet by mouth every 4 (four) hours as needed for  pain. Must last 30 days.    Dispense:  150 tablet    Refill:  0    Chronic Pain. (STOP Act - Not applicable). Fill one day early if closed on scheduled refill date.  Maximum 5 tablets/day  . oxyCODONE-acetaminophen (PERCOCET) 10-325 MG tablet    Sig: Take 1 tablet by mouth every 4 (four) hours as needed for pain. Must last 30 days.    Dispense:  150 tablet    Refill:  0    Chronic Pain. (STOP Act - Not applicable). Fill one day early if closed on scheduled refill date. Maximum 5 tablets/day   Follow-up plan:   Return for Keep sch. appt.    Recent Visits Date Type Provider Dept  12/04/19 Procedure visit Gillis Santa, MD [facility]  11/21/19 Office Visit Gillis Santa, MD [facility]  10/25/19 Procedure visit Gillis Santa, MD [facility]  Showing recent visits within past 90 days and meeting all other requirements   Today's Visits Date Type Provider Dept  12/19/19 Office Visit Gillis Santa, MD [facility]  Showing today's visits and meeting all other requirements   Future Appointments Date Type Provider Dept  02/06/20 Appointment Gillis Santa, MD [facility]  Showing future appointments within next 90 days and meeting all other requirements   I discussed the assessment and treatment plan with the patient. The patient was provided an opportunity to ask questions and all were answered. The patient agreed with the plan and demonstrated an understanding of the instructions.  Patient advised to call back or seek an in-person evaluation if the symptoms or condition worsens.  Duration of encounter:25 minutes.  Note by: Gillis Santa, MD Date: 12/19/2019; Time: 11:27 AM

## 2019-12-22 NOTE — Discharge Instructions (Signed)
Instructions after Total Knee Replacement   Pius Byrom P. Sylas Twombly, Jr., M.D.     Dept. of Orthopaedics & Sports Medicine  [facility]  [address]  [place], [place]  [address]  Phone: [phone]   Fax: [phone]    DIET: Drink plenty of non-alcoholic fluids. Resume your normal diet. Include foods high in fiber.  ACTIVITY:  You may use crutches or a walker with weight-bearing as tolerated, unless instructed otherwise. You may be weaned off of the walker or crutches by your Physical Therapist.  Do NOT place pillows under the knee. Anything placed under the knee could limit your ability to straighten the knee.   Continue doing gentle exercises. Exercising will reduce the pain and swelling, increase motion, and prevent muscle weakness.   Please continue to use the TED compression stockings for 6 weeks. You may remove the stockings at night, but should reapply them in the morning. Do not drive or operate any equipment until instructed.  WOUND CARE:  Continue to use the PolarCare or ice packs periodically to reduce pain and swelling. You may bathe or shower after the staples are removed at the first office visit following surgery.  MEDICATIONS: You may resume your regular medications. Please take the pain medication as prescribed on the medication. Do not take pain medication on an empty stomach. You have been given a prescription for a blood thinner (Lovenox or Coumadin). Please take the medication as instructed. (NOTE: After completing a 2 week course of Lovenox, take one Enteric-coated aspirin once a day. This along with elevation will help reduce the possibility of phlebitis in your operated leg.) Do not drive or drink alcoholic beverages when taking pain medications.  CALL THE OFFICE FOR: Temperature above 101 degrees Excessive bleeding or drainage on the dressing. Excessive swelling, coldness, or paleness of the toes. Persistent nausea and vomiting.  FOLLOW-UP:  You  should have an appointment to return to the office in 10-14 days after surgery. Arrangements have been made for continuation of Physical Therapy (either home therapy or outpatient therapy).   [facility] Department Directory         [email or web address]       [email or web address]          Cardiology  Appointments: [place] - [phone] [place] - [phone]  Endocrinology  Appointments: [place] - [phone] [place] - [phone]  Gastroenterology  Appointments: [place] - [phone] [place] - [phone]        General Surgery   Appointments: [place] - [phone]  Internal Medicine/Family Medicine  Appointments: [place] - [phone] [place] - [phone] [place] - [phone]  Metabolic and Weigh Loss Surgery  Appointments: [place] - [phone]        Neurology  Appointments: [place] - [phone] [place] - [phone]  Neurosurgery  Appointments: [place] - [phone]  Obstetrics & Gynecology  Appointments: [place] - [phone] [place] - [phone]        Pediatrics  Appointments: [place] - [phone] [place] - [phone]  Physiatry  Appointments: [place] -[phone]  Physical Therapy  Appointments: [place] - [phone] [place] - [phone]        Podiatry  Appointments: [place] - [phone] [place] - [phone]  Pulmonology  Appointments: [place] - [phone]  Rheumatology  Appointments: [place] - [phone]        [place] Location: [facility]  [address] , [place]  [address]  [place] Location: [facility] [address] [place], [place]  [address]  [place] Location: [facility] [address] [place], [place]  [address]    

## 2020-01-02 ENCOUNTER — Other Ambulatory Visit: Payer: Self-pay

## 2020-01-02 ENCOUNTER — Other Ambulatory Visit
Admission: RE | Admit: 2020-01-02 | Discharge: 2020-01-02 | Disposition: A | Discharge status: Home / Self Care | Payer: Medicare Other | Source: Ambulatory Visit | Attending: Orthopedic Surgery | Admitting: Orthopedic Surgery

## 2020-01-02 ENCOUNTER — Encounter
Admission: RE | Admit: 2020-01-02 | Discharge: 2020-01-02 | Disposition: A | Discharge status: Home / Self Care | Payer: Medicare Other | Source: Ambulatory Visit | Attending: Orthopedic Surgery | Admitting: Orthopedic Surgery

## 2020-01-02 DIAGNOSIS — Z01818 Encounter for other preprocedural examination: Secondary | ICD-10-CM | POA: Insufficient documentation

## 2020-01-02 LAB — COMPREHENSIVE METABOLIC PANEL
ALT: 13 U/L (ref 0–44)
AST: 19 U/L (ref 15–41)
Albumin: 3.8 g/dL (ref 3.5–5.0)
Alkaline Phosphatase: 95 U/L (ref 38–126)
Anion gap: 11 (ref 5–15)
BUN: 26 mg/dL — ABNORMAL HIGH (ref 8–23)
CO2: 27 mmol/L (ref 22–32)
Calcium: 9.3 mg/dL (ref 8.9–10.3)
Chloride: 102 mmol/L (ref 98–111)
Creatinine, Ser: 1.02 mg/dL — ABNORMAL HIGH (ref 0.44–1.00)
GFR calc Af Amer: >60 mL/min (ref >60)
GFR calc non Af Amer: 56 mL/min — ABNORMAL LOW (ref >60)
Glucose, Bld: 119 mg/dL — ABNORMAL HIGH (ref 70–99)
Potassium: 4.4 mmol/L (ref 3.5–5.1)
Sodium: 140 mmol/L (ref 135–145)
Total Bilirubin: 0.6 mg/dL (ref 0.3–1.2)
Total Protein: 7 g/dL (ref 6.5–8.1)

## 2020-01-02 LAB — CBC
HCT: 41.3 % (ref 36.0–46.0)
Hemoglobin: 13.3 g/dL (ref 12.0–15.0)
MCH: 28.4 pg (ref 26.0–34.0)
MCHC: 32.2 g/dL (ref 30.0–36.0)
MCV: 88.1 fL (ref 80.0–100.0)
Platelets: 271 10*3/uL (ref 150–400)
RBC: 4.69 MIL/uL (ref 3.87–5.11)
RDW: 15.1 % (ref 11.5–15.5)
WBC: 6.2 10*3/uL (ref 4.0–10.5)
nRBC: 0 % (ref 0.0–0.2)

## 2020-01-02 LAB — URINALYSIS, ROUTINE W REFLEX MICROSCOPIC
Bacteria, UA: NONE SEEN
Bilirubin Urine: NEGATIVE
Glucose, UA: NEGATIVE mg/dL
Hgb urine dipstick: NEGATIVE
Ketones, ur: 5 mg/dL — AB
Nitrite: NEGATIVE
Protein, ur: NEGATIVE mg/dL
Specific Gravity, Urine: 1.032 — ABNORMAL HIGH (ref 1.005–1.030)
pH: 5 (ref 5.0–8.0)

## 2020-01-02 LAB — TYPE AND SCREEN
ABO/RH(D): O POS
Antibody Screen: NEGATIVE

## 2020-01-02 LAB — APTT: aPTT: 30 seconds (ref 24–36)

## 2020-01-02 LAB — PROTIME-INR
INR: 1.1 (ref 0.8–1.2)
Prothrombin Time: 13.7 seconds (ref 11.4–15.2)

## 2020-01-02 LAB — C-REACTIVE PROTEIN: CRP: 0.7 mg/dL (ref <1.0)

## 2020-01-02 LAB — SEDIMENTATION RATE: Sed Rate: 43 mm/hr — ABNORMAL HIGH (ref 0–30)

## 2020-01-02 LAB — SURGICAL PCR SCREEN
MRSA, PCR: NEGATIVE
Staphylococcus aureus: NEGATIVE

## 2020-01-02 MED ORDER — CELECOXIB 400 MG PO CAPS: 400.0000 mg | ORAL_CAPSULE | Freq: Once | ORAL | Status: DC

## 2020-01-02 NOTE — Patient Instructions (Signed)
Your COVID test is scheduled on: Wednesday 01/10/2020 Drive up in front of [facility] entrance between [place] procedure is scheduled on: Friday 01/12/2020 [facility] (Main Entrance) take elevator to 2nd floor and check in at Surgery Information desk. To find out your arrival time, call [phone] 1:00-3:00 PM on Thursday 01/11/2020  Remember: Instructions that are not followed completely may result in serious medical risk, up to and including death, or upon the discretion of your surgeon and anesthesiologist your surgery may need to be rescheduled.   __x__ 1. Do not eat food (including mints, candies, chewing gum) after midnight the night before your procedure. You may drink clear liquids up to 2 hours before you are scheduled to arrive at the hospital for your procedure.  Do not drink anything within 2 hours of your scheduled arrival to the hospital.  Approved clear liquids:  --Water or Apple juice without pulp  --Clear carbohydrate beverage such as Gatorade or Powerade  --Black Coffee or Clear Tea (No milk, no creamers, do not add anything to the coffee or tea)   __x__ 2. Finish your provided Ensure pre-surgery drink 2 hours before your scheduled arrival time on the day of surgery.  __x__ 3. No alcohol or smoking for 24 hours before or after surgery.  __x__ 4. Notify your doctor if there is any change in your medical condition (cold, fever, infections).  __x__ 5. On the morning of surgery brush your teeth with toothpaste and water.  You may rinse your mouth with mouthwash if you wish.  Do not swallow any toothpaste or mouthwash.  Please read over the following fact sheets that you were given: [facility] Preparing for Surgery, MRSA Information, How to Use an Incentive Spirometer, Guide to Total Joint Replacement   __x__ Use provided CHG Soap as directed on instruction sheet.  Do not wear jewelry, make-up, hairpins, clips or nail polish on the day of surgery. Do  not wear lotions, powders, deodorant, or perfumes.  Do not shave below the face/neck 48 hours prior to surgery.  Do not bring valuables to the hospital.  [facility] is not responsible for any belongings or valuables.   Eyeglasses may not be worn into surgery.  If available, please bring a plastic case for your eyeglasses. For patients admitted to the hospital, discharge time is determined by your treatment team. For patients discharged after surgery, you will NOT be permitted to drive yourself home.  __x__ Take these medicines on the morning of surgery with a SMALL SIP OF WATER:  1. Amlodipine (Norvasc)   2. Aripiprazole (Abilify)  3. Atorvastatin (Lipitor)  4. Gabapentin (Neurontin)  5. Pantoprazole (Protonix)  6. Venlafaxine (Effexor)  SKIP ONLY ON THE MORNING OF SURGERY: Lisinopril (Prinivil).  You do NOT need to stop taking this ahead of time.  __x__ Use your Albuterol inhalers on the day of surgery before arriving to hospital.  _n/a_ Follow recommendations from Cardiologist, Pulmonologist or PCP regarding stopping Aspirin, Coumadin, Plavix, Eliquis, Effient, Pradaxa, and Pletal.  __x__ STARTING TODAY UNTIL AFTER SURGERY: Do not take any Anti-inflammatories such as Advil, Ibuprofen, Motrin, Aleve, Naproxen, Naprosyn, BC/Goodies powders or aspirin products. You may take Tylenol if needed.   __x__ STARTING TODAY: Do not take any over the counter supplements or vitamins (Alpha Lipoic Acid) until after surgery. You may continue to take your Vitamin D and Vitamin B.

## 2020-01-03 LAB — URINE CULTURE
Culture: NO GROWTH
Special Requests: NORMAL

## 2020-01-04 LAB — IGE: IgE (Immunoglobulin E), Serum: 2 IU/mL — ABNORMAL LOW (ref 6–495)

## 2020-01-10 ENCOUNTER — Other Ambulatory Visit: Payer: Self-pay

## 2020-01-10 ENCOUNTER — Other Ambulatory Visit
Admission: RE | Admit: 2020-01-10 | Discharge: 2020-01-10 | Disposition: A | Discharge status: Home / Self Care | Payer: Medicare Other | Source: Ambulatory Visit | Attending: Orthopedic Surgery | Admitting: Orthopedic Surgery

## 2020-01-10 LAB — SARS CORONAVIRUS 2 (TAT 6-24 HRS): SARS Coronavirus 2: NEGATIVE

## 2020-01-11 ENCOUNTER — Encounter: Payer: Self-pay | Admitting: Orthopedic Surgery

## 2020-01-11 NOTE — H&P (Signed)
ORTHOPAEDIC HISTORY & PHYSICAL Progress Notes  Lisa Good, [place] - 01/02/2020 10:45 AM EDT [facility] Chief Complaint:   Chief Complaint  Patient presents with  . Pre-op Exam  H&P left TKA 04/09   History of Present Illness:   Lisa Good is a 69 y.o. female that presents to clinic today for her preoperative history and evaluation. Patient presents unaccompanied. The patient is scheduled to undergo a left total knee arthroplasty on 01/12/20 by Dr. Marry Guan. Her pain began several years ago. The pain is located along the medial and lateral aspects of the knee. She reports associated increased pain with weightbearing, some swelling, and some giving way of the knee. She denies associated numbness or tingling.   The patient's symptoms have progressed to the point that they decrease her quality of life. The patient has previously undergone conservative treatment including NSAIDS and injections to the knee without adequate control of her symptoms.  She denies history of blood clots. She denies significant cardiac history. Patient has a history of lumbar fusion. Also of note, the patient is 5 months status post C4-5 corpectomy with C6-7 ACDF, C2-T3 posterior spinal fusion for cervical myelopathy by Dr. Izora Ribas. She is also being followed for lumbar spinal stenosis. Patient is seen by pain management for her back and knee. Per the patient, pain management instructed her to stop taking her buprenorphine and take oxycodone 10-325 instead  Past Medical, Surgical, Family, Social History, Allergies, Medications:   Past Medical History:  Past Medical History:  Diagnosis Date  . Allergic rhinitis  . Arthritis  . BiPAP (biphasic positive airway pressure) dependence  . Chondromalacia of patella  . DDD (degenerative disc disease), lumbar  . Depression  . Fibromyalgia  . GERD (gastroesophageal reflux disease)  . Hypertension  . Peripheral neuropathy   . Seasonal allergies  . Sleep apnea  uses CPAP  . Thyroid disease   Past Surgical History:  Past Surgical History:  Procedure Laterality Date  . ABDOMINOPLASTY  . ANALYSIS PROGRAMMABLE SPINAL INFUSION PUMP N/A 07/10/2019  Procedure: LAM W/O FACETEC FORAMOT/DSKC 1/2 VRT SEG, CERVICAL; Surgeon: Ricard Dillon, MD; Location: [facility]; Service: Neurosurgery; Laterality: N/A;  . ARTHRODESIS ANTERIOR CERVICLE SPINE N/A 07/10/2019  Procedure: C4-5 CORPECTOMY WITH C6-7 ACDF, C2-T3 POSTERIOR SPINAL FUSION; Surgeon: Ricard Dillon, MD; Location: [facility]; Service: Neurosurgery; Laterality: N/A;  . ARTHRODESIS ANTERIOR CERVICLE SPINE N/A 07/10/2019  Procedure: ARTHRODESIS, ANTERIOR INTERBODY TECHNIQUE, INCLUDING MINIMAL DISCECTOMY TO PREPARE INTERSPACE (OTHER THAN DECOMPRESSION); EACH ADDITIONAL INTERSPACE (LIST IN ADDITION TO CODE FOR PRIMARY PROCEDURE); Surgeon: Ricard Dillon, MD; Location: [facility]; Service: Neurosurgery; Laterality: N/A;  . AUGMENTATION MAMMAPLASTY  . BACK SURGERY  spinal fusion  . CEREBRAL ANEURYSM REPAIR 2002  . CORPECTOMY CERVICLE W/DECOMPRESSION BY ANTERIOR APPROACH N/A 07/10/2019  Procedure: VERTEBRAL CORPECTOMY (VERTEBRAL BODY RESECTION), PARTIAL OR COMPLETE, ANTERIOR APPROACH WITH DECOMPRESSION OF SPINAL CORD AND/OR NERVE ROOT(S); CERVICAL, SINGLE SEGMENT; Surgeon: Ricard Dillon, MD; Location: [facility]; Service: Neurosurgery; Laterality: N/A;  . CORPECTOMY VERTEBRAE PARTIAL/COMPLETE BY ANTERIOR CERVICLE APPROACH N/A 07/10/2019  Procedure: VERTEBRAL CORPECTOMY, PARTIAL OR COMPLETE, ANTERIOR APPROACH W/DECOMPRESSION SPINAL CORD AND/OR NERVE ROOT; CERVICAL, EACH ADDITIONAL SEGMENT (LIST IN ADDITION TO PRIMARY PROCEDURE); Surgeon: Ricard Dillon, MD; Location: [facility]; Service: Neurosurgery; Laterality: N/A;  . ENDOMETRIAL ABLATION W/ NOVASURE  . FUSION CERVICLE SPINE ANTERIOR ONE LEVEL N/A 07/10/2019  Procedure: ARTHRODESIS,  ANTERIOR INTERBODY TECHNIQUE, INCLUDING MINIMAL DISCECTOMY TO PREPARE INTERSPACE (OTHER THAN FOR DECOMPRESSION); CERVICAL BELOW C2;  Surgeon: Ricard Dillon, MD; Location: [facility]; Service: Neurosurgery; Laterality: N/A;  . INSTRUMENTATION POSTERIOR SPINE 13/MORE VERTEBRAL SEGMENTS N/A 07/10/2019  Procedure: POSTERIOR SEGMENTAL INSTRUMENTATION (EG, PEDICLE FIXATION, DUAL RODS WITH MULTIPLE HOOKS AND SUBLAMINAR WIRES); 7 TO 12 VERTEBRAL SEGMENTS (LIST IN ADDITION TO PRIMARY PROCEDURE); Surgeon: Ricard Dillon, MD; Location: [facility]; Service: Neurosurgery; Laterality: N/A;  . INTRAOPERATIVE FLUOROSCOPY N/A 07/10/2019  Procedure: FLUOROSCOPY; Surgeon: Ricard Dillon, MD; Location: [facility]; Service: Neurosurgery; Laterality: N/A;  . JOINT REPLACEMENT Right  knee  . lumbar spine fusion surgery 2018  . POSTERIOR THORACIC SPINE FUSION/ARTHRODESIS ONE LEVEL N/A 07/10/2019  Procedure: ARTHRODESIS, POSTERIOR OR POSTEROLATERAL TECHNIQUE, SINGLE LEVEL; CERVICAL BELOW C2 SEGMENT; Surgeon: Ricard Dillon, MD; Location: [facility]; Service: Neurosurgery; Laterality: N/A;  . REMOVAL ANTERIOR CERVICLE SPINAL HARDWARE N/A 07/10/2019  Procedure: INSERTION OF INTERVERTEBRAL BIOMECHANICAL DEVICE WITH INTEGRAL ANTERIOR INSTRUMENTATION, TO VERTEBRAL CORPECTOMY DEFECT, IN CONJUNCTION WITH INTERBODY ARTHRODESIS (LIST CODE FOR PRIMARY PROCEDURE); Surgeon: Ricard Dillon, MD; Location: [facility]; Service: Neurosurgery; Laterality: N/A;  . suprapubic sling  . TONSILLECTOMY   Current Medications:  Current Outpatient Medications  Medication Sig Dispense Refill  . albuterol 90 mcg/actuation inhaler Inhale 2 inhalations into the lungs every 4 (four) hours as needed for Wheezing 1 Inhaler 1  . alpha lipoic acid 100 mg Cap Take 300 mg by mouth 2 (two) times daily  . amLODIPine (NORVASC) 10 MG tablet Take 1 tablet (10 mg total) by mouth once daily 90 tablet 0  . ARIPiprazole (ABILIFY)  5 MG tablet Take 5 mg by mouth once daily  . atorvastatin (LIPITOR) 40 MG tablet Take 1 tablet (40 mg total) by mouth once daily 90 tablet 1  . gabapentin (NEURONTIN) 800 MG tablet Take 800 mg by mouth 4 (four) times daily  . inhalational spacer (AEROCHAMBER) spacer Use as instructed. 1 each 2  . levoFLOXacin (LEVAQUIN) 500 MG tablet Take 1 tablet (500 mg total) by mouth once daily for 10 days 10 tablet 0  . lisinopriL (ZESTRIL) 40 MG tablet Take 1 tablet (40 mg total) by mouth nightly 90 tablet 0  . mucus clearing device (FLUTTER) Devi bipap at bedtime  . oxyCODONE-acetaminophen (PERCOCET) 10-325 mg tablet Take by mouth  . pantoprazole (PROTONIX) 40 MG DR tablet Take 1 tablet (40 mg total) by mouth once daily 90 tablet 1  . predniSONE (DELTASONE) 5 MG tablet Take 30 mg on day one decreasing medication by 5 mg each day until medication is finished. 21 tablet 0  . triamcinolone 0.1 % cream Apply topically 2 (two) times daily Apply to affected areas 2 times per day as need (may use for 7-10 days, then take 7 days off, may repeat as needed 30 g 0  . venlafaxine (EFFEXOR-XR) 150 MG XR capsule Take 1 capsule (150 mg total) by mouth once daily 90 capsule 1  . amoxicillin (AMOXIL) 500 MG capsule Take 4 tablets 1 hour prior to dental procedure  . BELBUCA 600 mcg Film Place 1 Film inside cheek every 12 (twelve) hours as needed  . naloxone (NARCAN) 4 mg/actuation nasal spray 1 spray (4 mg total) by Intranasal route once as needed (if not breathing.) for up to 1 dose (Patient not taking: Reported on 01/02/2020 ) 2 each 0   No current facility-administered medications for this visit.   Allergies:  Allergies  Allergen Reactions  . Iodinated Contrast Media Anaphylaxis, Hives, Itching and Swelling  . Cephalexin Nausea  nausea  . Duloxetine Hcl Swelling  .  Iodine Hives and Itching  Constrast Dye  . Pregabalin Swelling  . Spironolacton-Hydrochlorothiaz Rash  Only with IV injection.   . Clarithromycin  Nausea   Social History:  Social History   Socioeconomic History  . Marital status: Legally Separated  Spouse name: Jori Moll  . Number of children: 3  . Years of education: 62  . Highest education level: High school graduate  Occupational History  . Occupation: Retired  Scientific laboratory technician  . Financial resource strain: Not on file  . Food insecurity  Worry: Not on file  Inability: Not on file  . Transportation needs  Medical: Not on file  Non-medical: Not on file  Tobacco Use  . Smoking status: Former Smoker  Packs/day: 2.00  Years: 30.00  Pack years: 60.00  Quit date: 06/27/2001  Years since quitting: 18.5  . Smokeless tobacco: Never Used  Substance and Sexual Activity  . Alcohol use: Yes  Frequency: Monthly or less  Comment: rarely  . Drug use: Never  . Sexual activity: Not on file  Lifestyle  . Physical activity  Days per week: Not on file  Minutes per session: Not on file  . Stress: Not on file  Relationships  . Social Medical illustrator on phone: Not on file  Gets together: Not on file  Attends religious service: Not on file  Active member of club or organization: Not on file  Attends meetings of clubs or organizations: Not on file  Relationship status: Not on file  Other Topics Concern  . Not on file  Social History Narrative  . Not on file   Family History:  Family History  Problem Relation Age of Onset  . Diabetes Mother   Review of Systems:   A 10+ ROS was performed, reviewed, and the pertinent orthopaedic findings are documented in the HPI.   Physical Examination:   BP 126/76  Ht 162.6 cm (5' 4" )  Wt 88.5 kg (195 lb)  BMI 33.47 kg/m   Patient is a well-developed, well-nourished female in no acute distress. Patient has normal mood and affect. Patient is alert and oriented to person, place, and time.   HEENT: Atraumatic, normocephalic. Pupils equal and reactive to light. Extraocular motion intact. Noninjected sclera.  Cardiovascular: Regular  rate and rhythm, with no murmurs, rubs, or gallops. Distal pulses palpable.  Respiratory: Lungs clear to auscultation bilaterally. Faint crackles in the left lower lung field that cleared after coughing.   Left Knee: Soft tissue swelling: mild Effusion: none Erythema: none Crepitance: mild Tenderness: medial, lateral Alignment: relative valgus Mediolateral laxity: lateral pseudolaxity Posterior sag: negative Patellar tracking: Good tracking without evidence of subluxation or tilt Atrophy: No significant atrophy.  Quadriceps tone was fair to good. Range of motion: 0/0/121 degrees Hamstrings are tight.   Sensation intact over the saphenous and lateral sural cutaneous distributions. Intact but decreased over the superficial fibular and deep fibular nerve distributions.  Tests Performed/Reviewed:  X-rays  No new radiographs were obtained today. Previous radiographs were reviewed of the left knee and revealed severe loss of lateral compartment joint space with osteophyte formation. Medial compartment reveals mild loss of joint space with osteophyte formation. Patellofemoral joint reveals mild loss of joint space with osteophyte formation. No fractures or dislocations noted.  Impression:   ICD-10-CM  1. Primary osteoarthritis of left knee M17.12   Plan:   The patient has end-stage degenerative changes of the left knee. It was explained to the patient that the condition is progressive in nature. Having failed conservative treatment,  the patient has elected to proceed with a total joint arthroplasty. The patient will undergo a total joint arthroplasty with Dr. Marry Guan. The risks of surgery, including blood clot and infection, were discussed with the patient. Measures to reduce these risks, including the use of anticoagulation, perioperative antibiotics, and early ambulation were discussed. The importance of postoperative physical therapy was discussed with the patient. The patient elects to  proceed with surgery. The patient is instructed to stop all blood thinners prior to surgery. The patient is instructed to call the hospital the day before surgery to learn of the proper arrival time.   Contact our office with any questions or concerns. Follow up as indicated, or sooner should any new problems arise, if conditions worsen, or if they are otherwise concerned.   Lisa Fudge, PA [facility] [address] [place], [place] [address] Phone: [phone]  This note was generated in part with voice recognition software and I apologize for any typographical errors that were not detected and corrected.   Electronically signed by Lisa Fudge, PA at 01/02/2020 12:55 PM EDT

## 2020-01-12 ENCOUNTER — Inpatient Hospital Stay: Payer: Medicare Other | Admitting: Family

## 2020-01-12 ENCOUNTER — Inpatient Hospital Stay
Admission: RE | Admit: 2020-01-12 | Discharge: 2020-01-14 | DRG: 470 | Disposition: A | Discharge status: Home / Self Care | Payer: Medicare Other | Attending: Orthopedic Surgery | Admitting: Orthopedic Surgery

## 2020-01-12 ENCOUNTER — Encounter
Admission: RE | Disposition: A | Discharge status: Home / Self Care | Payer: Self-pay | Source: Home / Self Care | Attending: Orthopedic Surgery

## 2020-01-12 ENCOUNTER — Inpatient Hospital Stay: Payer: Medicare Other

## 2020-01-12 ENCOUNTER — Other Ambulatory Visit: Payer: Self-pay

## 2020-01-12 ENCOUNTER — Encounter: Payer: Self-pay | Admitting: Orthopedic Surgery

## 2020-01-12 DIAGNOSIS — Z91041 Radiographic dye allergy status: Secondary | ICD-10-CM | POA: Diagnosis not present

## 2020-01-12 DIAGNOSIS — Z881 Allergy status to other antibiotic agents status: Secondary | ICD-10-CM | POA: Diagnosis not present

## 2020-01-12 DIAGNOSIS — Z20822 Contact with and (suspected) exposure to covid-19: Secondary | ICD-10-CM | POA: Diagnosis present

## 2020-01-12 DIAGNOSIS — Z87891 Personal history of nicotine dependence: Secondary | ICD-10-CM

## 2020-01-12 DIAGNOSIS — G894 Chronic pain syndrome: Secondary | ICD-10-CM | POA: Diagnosis present

## 2020-01-12 DIAGNOSIS — M797 Fibromyalgia: Secondary | ICD-10-CM | POA: Diagnosis present

## 2020-01-12 DIAGNOSIS — J302 Other seasonal allergic rhinitis: Secondary | ICD-10-CM | POA: Diagnosis present

## 2020-01-12 DIAGNOSIS — G2581 Restless legs syndrome: Secondary | ICD-10-CM | POA: Diagnosis present

## 2020-01-12 DIAGNOSIS — Z6833 Body mass index (BMI) 33.0-33.9, adult: Secondary | ICD-10-CM

## 2020-01-12 DIAGNOSIS — G4733 Obstructive sleep apnea (adult) (pediatric): Secondary | ICD-10-CM | POA: Diagnosis present

## 2020-01-12 DIAGNOSIS — E039 Hypothyroidism, unspecified: Secondary | ICD-10-CM | POA: Diagnosis present

## 2020-01-12 DIAGNOSIS — Z96659 Presence of unspecified artificial knee joint: Secondary | ICD-10-CM

## 2020-01-12 DIAGNOSIS — Z7289 Other problems related to lifestyle: Secondary | ICD-10-CM

## 2020-01-12 DIAGNOSIS — E785 Hyperlipidemia, unspecified: Secondary | ICD-10-CM | POA: Diagnosis present

## 2020-01-12 DIAGNOSIS — I10 Essential (primary) hypertension: Secondary | ICD-10-CM | POA: Diagnosis present

## 2020-01-12 DIAGNOSIS — Z9689 Presence of other specified functional implants: Secondary | ICD-10-CM | POA: Diagnosis present

## 2020-01-12 DIAGNOSIS — M48061 Spinal stenosis, lumbar region without neurogenic claudication: Secondary | ICD-10-CM | POA: Diagnosis present

## 2020-01-12 DIAGNOSIS — Z981 Arthrodesis status: Secondary | ICD-10-CM | POA: Diagnosis not present

## 2020-01-12 DIAGNOSIS — D509 Iron deficiency anemia, unspecified: Secondary | ICD-10-CM | POA: Diagnosis present

## 2020-01-12 DIAGNOSIS — F411 Generalized anxiety disorder: Secondary | ICD-10-CM | POA: Diagnosis present

## 2020-01-12 DIAGNOSIS — Z888 Allergy status to other drugs, medicaments and biological substances status: Secondary | ICD-10-CM

## 2020-01-12 DIAGNOSIS — M1712 Unilateral primary osteoarthritis, left knee: Secondary | ICD-10-CM | POA: Diagnosis present

## 2020-01-12 DIAGNOSIS — M25762 Osteophyte, left knee: Secondary | ICD-10-CM | POA: Diagnosis present

## 2020-01-12 DIAGNOSIS — Z79899 Other long term (current) drug therapy: Secondary | ICD-10-CM | POA: Diagnosis not present

## 2020-01-12 DIAGNOSIS — G629 Polyneuropathy, unspecified: Secondary | ICD-10-CM | POA: Diagnosis present

## 2020-01-12 DIAGNOSIS — F329 Major depressive disorder, single episode, unspecified: Secondary | ICD-10-CM | POA: Diagnosis present

## 2020-01-12 DIAGNOSIS — K219 Gastro-esophageal reflux disease without esophagitis: Secondary | ICD-10-CM | POA: Diagnosis present

## 2020-01-12 HISTORY — PX: KNEE ARTHROPLASTY: SHX992

## 2020-01-12 IMAGING — DX DG KNEE 1-2V PORT*L*
2 series · 2 of 2 positions shown · non-contrast
Comparison: None.

CLINICAL DATA: Total knee replacement

EXAM:
PORTABLE LEFT KNEE - 1-2 VIEW

[knee ap]
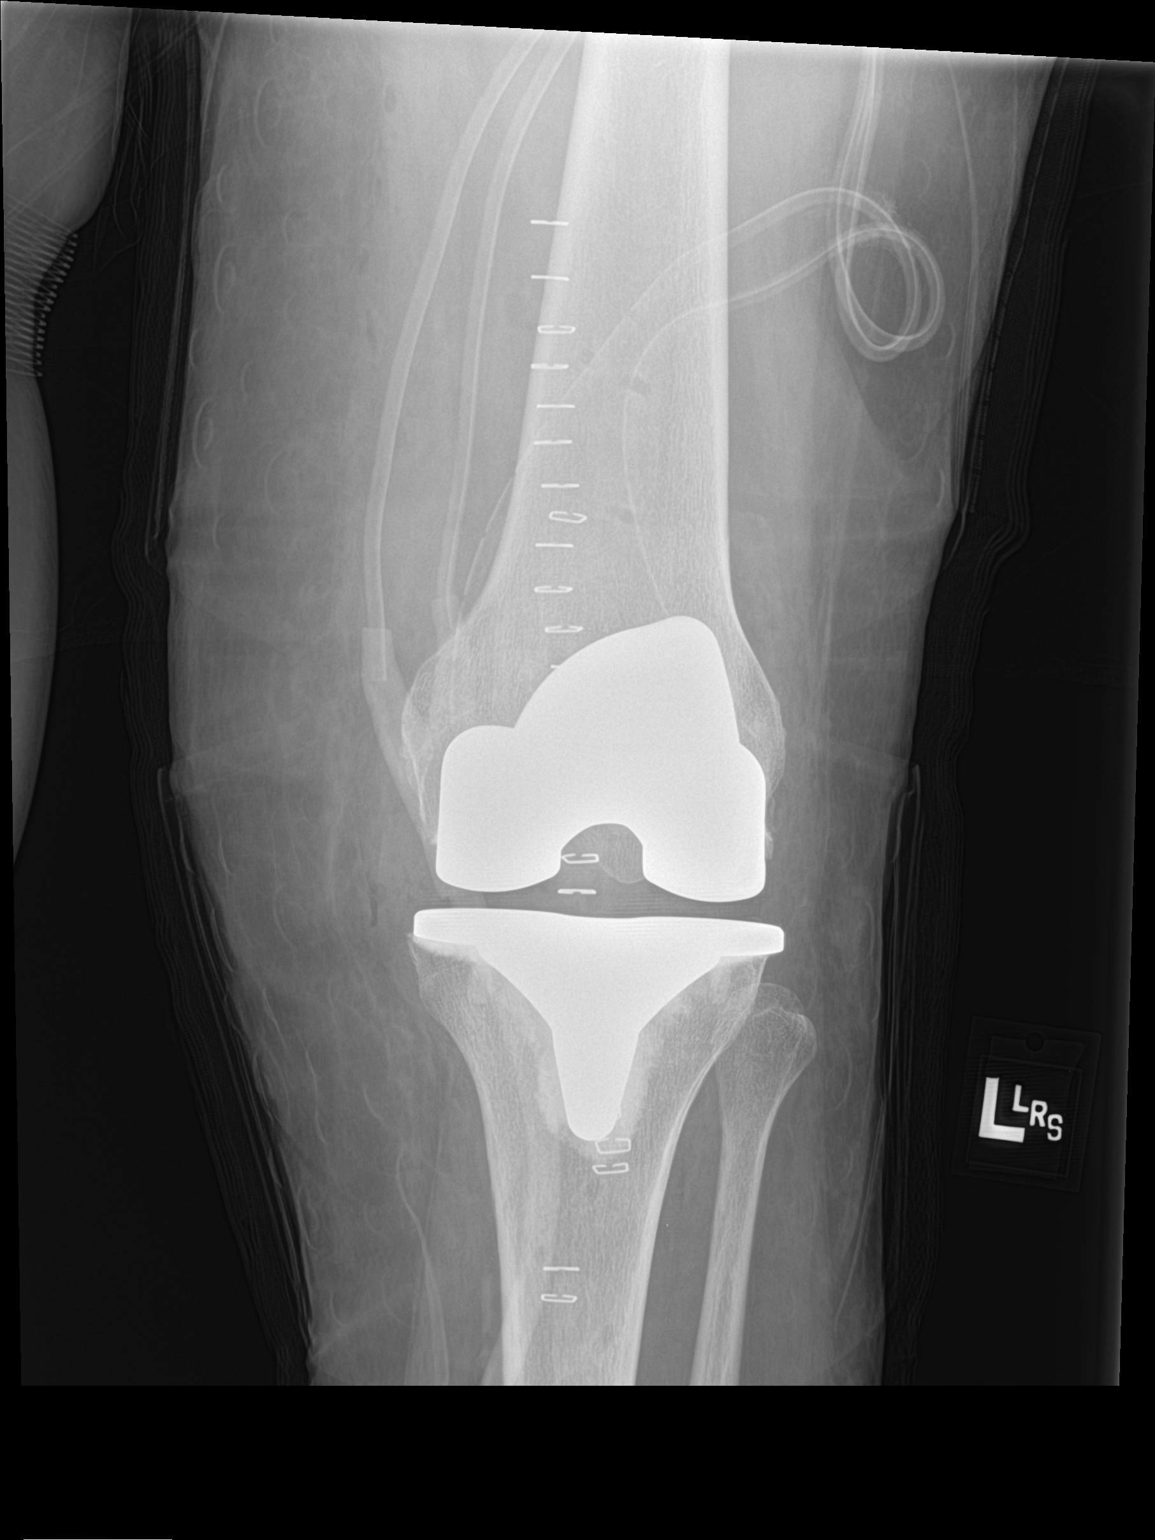

[knee lat]
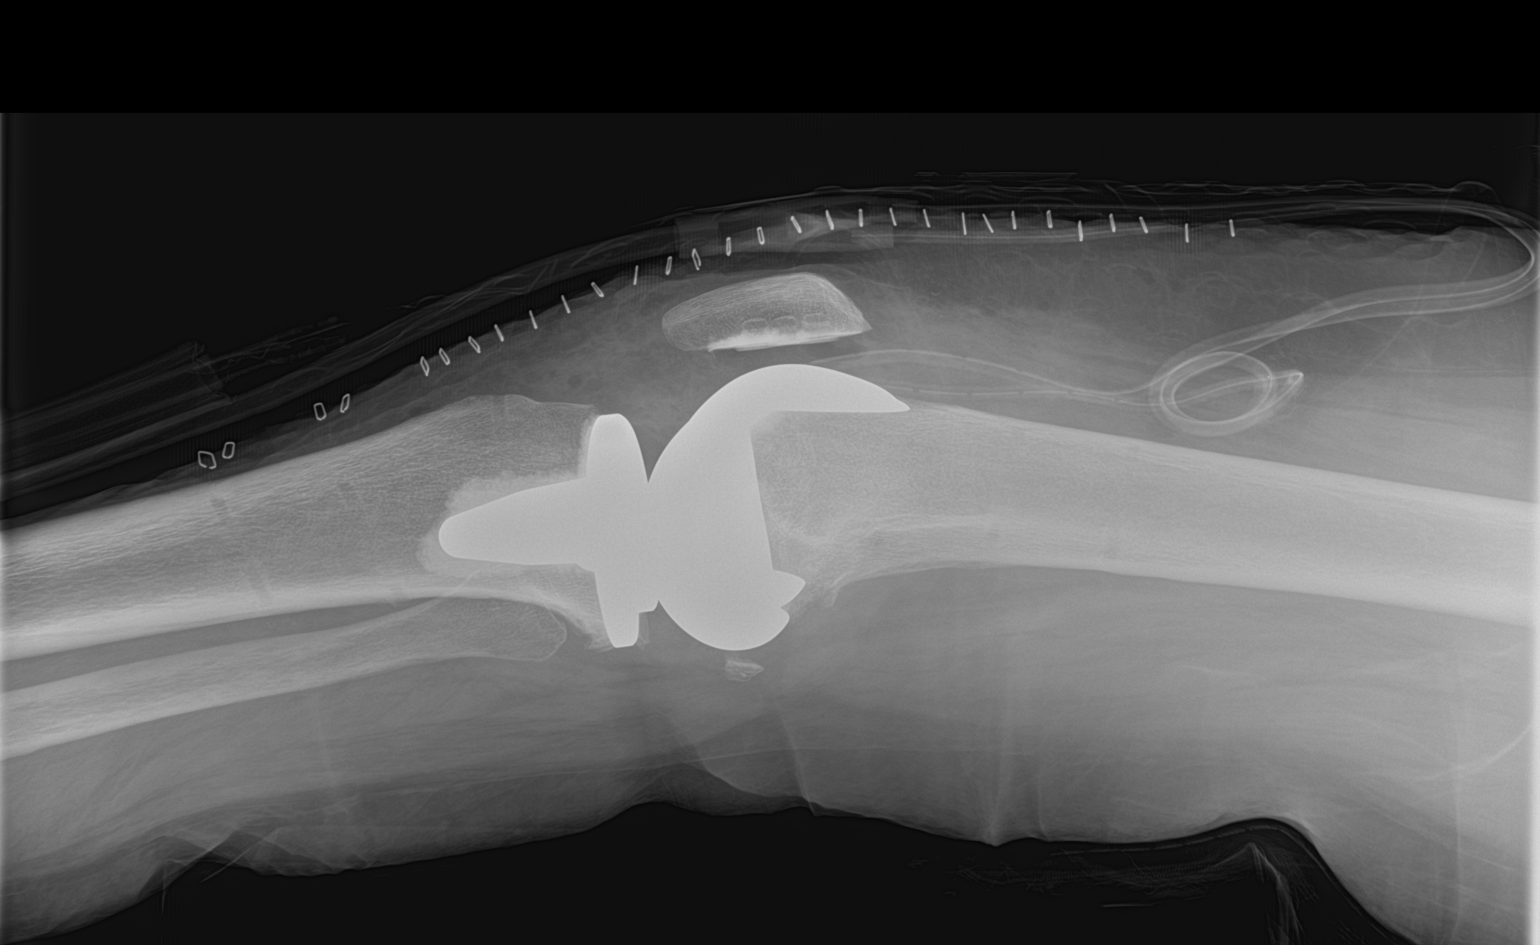

[2 of 2 positions shown; findings below may reference images not displayed]

FINDINGS: Total knee replacement in satisfactory position alignment. No
fracture or complication. Soft tissue drains in the suprapatellar
bursa.
IMPRESSION: Satisfactory left knee replacement.

## 2020-01-12 SURGERY — ARTHROPLASTY, KNEE, TOTAL, USING IMAGELESS COMPUTER-ASSISTED NAVIGATION
Anesthesia: General | Site: Knee | Laterality: Left

## 2020-01-12 MED ORDER — MENTHOL 3 MG MT LOZG: 1.0000 | LOZENGE | OROMUCOSAL | Status: DC | PRN

## 2020-01-12 MED ORDER — CEFAZOLIN SODIUM-DEXTROSE 2-4 GM/100ML-% IV SOLN
INTRAVENOUS | Status: AC
  Filled 2020-01-12: qty 100

## 2020-01-12 MED ORDER — HYDROMORPHONE HCL 1 MG/ML IJ SOLN
INTRAMUSCULAR | Status: AC
  Administered 2020-01-12: 0.5 mg via INTRAVENOUS
  Filled 2020-01-12: qty 1

## 2020-01-12 MED ORDER — PHENYLEPHRINE HCL (PRESSORS) 10 MG/ML IV SOLN
INTRAVENOUS | Status: AC
  Filled 2020-01-12: qty 1

## 2020-01-12 MED ORDER — CEFAZOLIN SODIUM-DEXTROSE 2-4 GM/100ML-% IV SOLN
2.0000 g | INTRAVENOUS | Status: AC
  Administered 2020-01-12: 08:00:00 2 g via INTRAVENOUS

## 2020-01-12 MED ORDER — BUPIVACAINE HCL (PF) 0.25 % IJ SOLN
INTRAMUSCULAR | Status: DC | PRN
  Administered 2020-01-12: 60 mL

## 2020-01-12 MED ORDER — DEXAMETHASONE SODIUM PHOSPHATE 10 MG/ML IJ SOLN
INTRAMUSCULAR | Status: AC
  Filled 2020-01-12: qty 1

## 2020-01-12 MED ORDER — METOCLOPRAMIDE HCL 5 MG/ML IJ SOLN: 5.0000 mg | Freq: Three times a day (TID) | INTRAMUSCULAR | Status: DC | PRN

## 2020-01-12 MED ORDER — TRANEXAMIC ACID-NACL 1000-0.7 MG/100ML-% IV SOLN: 1000.0000 mg | Freq: Once | INTRAVENOUS | Status: AC

## 2020-01-12 MED ORDER — CEFAZOLIN SODIUM-DEXTROSE 2-4 GM/100ML-% IV SOLN
2.0000 g | Freq: Four times a day (QID) | INTRAVENOUS | Status: AC
  Administered 2020-01-12 – 2020-01-13 (×4): 2 g via INTRAVENOUS
  Filled 2020-01-12 (×5): qty 100

## 2020-01-12 MED ORDER — CHLORHEXIDINE GLUCONATE 4 % EX LIQD: 60.0000 mL | Freq: Once | CUTANEOUS | Status: DC

## 2020-01-12 MED ORDER — ONDANSETRON HCL 4 MG/2ML IJ SOLN
INTRAMUSCULAR | Status: AC
  Filled 2020-01-12: qty 2

## 2020-01-12 MED ORDER — DIPHENHYDRAMINE HCL 12.5 MG/5ML PO ELIX: 12.5000 mg | ORAL_SOLUTION | ORAL | Status: DC | PRN

## 2020-01-12 MED ORDER — CELECOXIB 200 MG PO CAPS
ORAL_CAPSULE | ORAL | Status: AC
  Administered 2020-01-12: 07:00:00 400 mg
  Filled 2020-01-12: qty 2

## 2020-01-12 MED ORDER — ACETAMINOPHEN 10 MG/ML IV SOLN
INTRAVENOUS | Status: DC | PRN
  Administered 2020-01-12: 1000 mg via INTRAVENOUS

## 2020-01-12 MED ORDER — FERROUS SULFATE 325 (65 FE) MG PO TABS
325.0000 mg | ORAL_TABLET | Freq: Two times a day (BID) | ORAL | Status: DC
  Administered 2020-01-12 – 2020-01-14 (×3): 325 mg via ORAL
  Filled 2020-01-12 (×4): qty 1

## 2020-01-12 MED ORDER — PROPOFOL 500 MG/50ML IV EMUL
INTRAVENOUS | Status: AC
  Filled 2020-01-12: qty 50

## 2020-01-12 MED ORDER — DEXAMETHASONE SODIUM PHOSPHATE 10 MG/ML IJ SOLN
INTRAMUSCULAR | Status: AC
  Administered 2020-01-12: 07:00:00 8 mg via INTRAVENOUS
  Filled 2020-01-12: qty 1

## 2020-01-12 MED ORDER — BISACODYL 10 MG RE SUPP
10.0000 mg | Freq: Every day | RECTAL | Status: DC | PRN
  Administered 2020-01-14: 10 mg via RECTAL
  Filled 2020-01-12: qty 1

## 2020-01-12 MED ORDER — DEXMEDETOMIDINE HCL IN NACL 80 MCG/20ML IV SOLN
INTRAVENOUS | Status: AC
  Filled 2020-01-12: qty 20

## 2020-01-12 MED ORDER — FENTANYL CITRATE (PF) 100 MCG/2ML IJ SOLN
INTRAMUSCULAR | Status: AC
  Filled 2020-01-12: qty 2

## 2020-01-12 MED ORDER — ARIPIPRAZOLE 5 MG PO TABS
5.0000 mg | ORAL_TABLET | Freq: Every day | ORAL | Status: DC
  Administered 2020-01-14: 5 mg via ORAL
  Filled 2020-01-12 (×2): qty 1

## 2020-01-12 MED ORDER — DEXMEDETOMIDINE HCL IN NACL 200 MCG/50ML IV SOLN
INTRAVENOUS | Status: DC | PRN
  Administered 2020-01-12 (×3): 4 ug via INTRAVENOUS

## 2020-01-12 MED ORDER — SODIUM CHLORIDE 0.9 % IV SOLN: INTRAVENOUS | Status: DC

## 2020-01-12 MED ORDER — HYDROMORPHONE HCL 1 MG/ML IJ SOLN
0.5000 mg | INTRAMUSCULAR | Status: DC | PRN
  Administered 2020-01-12: 0.5 mg via INTRAVENOUS
  Filled 2020-01-12: qty 1

## 2020-01-12 MED ORDER — TRAMADOL HCL 50 MG PO TABS
50.0000 mg | ORAL_TABLET | ORAL | Status: DC | PRN
  Administered 2020-01-12 – 2020-01-13 (×3): 100 mg via ORAL
  Administered 2020-01-14: 50 mg via ORAL
  Filled 2020-01-12 (×2): qty 2
  Filled 2020-01-12: qty 1
  Filled 2020-01-12: qty 2

## 2020-01-12 MED ORDER — METOCLOPRAMIDE HCL 10 MG PO TABS
10.0000 mg | ORAL_TABLET | Freq: Three times a day (TID) | ORAL | Status: DC
  Administered 2020-01-12 – 2020-01-14 (×7): 10 mg via ORAL
  Filled 2020-01-12 (×8): qty 1

## 2020-01-12 MED ORDER — TRANEXAMIC ACID-NACL 1000-0.7 MG/100ML-% IV SOLN
INTRAVENOUS | Status: AC
  Administered 2020-01-12: 1000 mg via INTRAVENOUS
  Filled 2020-01-12: qty 100

## 2020-01-12 MED ORDER — OXYCODONE HCL 5 MG PO TABS
10.0000 mg | ORAL_TABLET | ORAL | Status: DC | PRN
  Administered 2020-01-13 – 2020-01-14 (×8): 10 mg via ORAL
  Filled 2020-01-12 (×8): qty 2

## 2020-01-12 MED ORDER — PROPOFOL 10 MG/ML IV BOLUS
INTRAVENOUS | Status: AC
  Filled 2020-01-12: qty 20

## 2020-01-12 MED ORDER — HYDROMORPHONE HCL 1 MG/ML IJ SOLN
0.5000 mg | INTRAMUSCULAR | Status: AC | PRN
  Administered 2020-01-12 (×2): 0.5 mg via INTRAVENOUS

## 2020-01-12 MED ORDER — NEOMYCIN-POLYMYXIN B GU 40-200000 IR SOLN
Status: DC | PRN
  Administered 2020-01-12: 14 mL

## 2020-01-12 MED ORDER — ALUM & MAG HYDROXIDE-SIMETH 200-200-20 MG/5ML PO SUSP: 30.0000 mL | ORAL | Status: DC | PRN

## 2020-01-12 MED ORDER — ONDANSETRON HCL 4 MG/2ML IJ SOLN
INTRAMUSCULAR | Status: DC | PRN
  Administered 2020-01-12: 4 mg via INTRAVENOUS

## 2020-01-12 MED ORDER — CELECOXIB 200 MG PO CAPS
200.0000 mg | ORAL_CAPSULE | Freq: Two times a day (BID) | ORAL | Status: DC
  Administered 2020-01-13 – 2020-01-14 (×3): 200 mg via ORAL
  Filled 2020-01-12 (×4): qty 1

## 2020-01-12 MED ORDER — VITAMIN B-12 1000 MCG PO TABS
1000.0000 ug | ORAL_TABLET | Freq: Every day | ORAL | Status: DC
  Administered 2020-01-13 – 2020-01-14 (×2): 1000 ug via ORAL
  Filled 2020-01-12 (×2): qty 1

## 2020-01-12 MED ORDER — DEXAMETHASONE SODIUM PHOSPHATE 10 MG/ML IJ SOLN
INTRAMUSCULAR | Status: DC | PRN
  Administered 2020-01-12: 10 mg via INTRAVENOUS

## 2020-01-12 MED ORDER — PANTOPRAZOLE SODIUM 40 MG PO TBEC
40.0000 mg | DELAYED_RELEASE_TABLET | Freq: Two times a day (BID) | ORAL | Status: DC
  Administered 2020-01-12 – 2020-01-14 (×4): 40 mg via ORAL
  Filled 2020-01-12 (×4): qty 1

## 2020-01-12 MED ORDER — LACTATED RINGERS IV SOLN: INTRAVENOUS | Status: DC

## 2020-01-12 MED ORDER — VITAMIN D 25 MCG (1000 UNIT) PO TABS
1000.0000 [IU] | ORAL_TABLET | Freq: Every day | ORAL | Status: DC
  Administered 2020-01-13 – 2020-01-14 (×2): 1000 [IU] via ORAL
  Filled 2020-01-12 (×2): qty 1

## 2020-01-12 MED ORDER — OXYCODONE HCL 5 MG PO TABS
5.0000 mg | ORAL_TABLET | ORAL | Status: DC | PRN
  Administered 2020-01-12 (×2): 5 mg via ORAL
  Filled 2020-01-12 (×2): qty 1

## 2020-01-12 MED ORDER — MIDAZOLAM HCL 2 MG/2ML IJ SOLN
INTRAMUSCULAR | Status: AC
  Filled 2020-01-12: qty 2

## 2020-01-12 MED ORDER — FENTANYL CITRATE (PF) 100 MCG/2ML IJ SOLN: 25.0000 ug | INTRAMUSCULAR | Status: DC | PRN

## 2020-01-12 MED ORDER — ALBUTEROL SULFATE HFA 108 (90 BASE) MCG/ACT IN AERS: 2.0000 | INHALATION_SPRAY | RESPIRATORY_TRACT | Status: DC | PRN

## 2020-01-12 MED ORDER — PHENYLEPHRINE HCL (PRESSORS) 10 MG/ML IV SOLN
INTRAVENOUS | Status: DC | PRN
  Administered 2020-01-12: 100 ug via INTRAVENOUS

## 2020-01-12 MED ORDER — ENOXAPARIN SODIUM 30 MG/0.3ML ~~LOC~~ SOLN
30.0000 mg | Freq: Two times a day (BID) | SUBCUTANEOUS | Status: DC
  Administered 2020-01-12 – 2020-01-14 (×4): 30 mg via SUBCUTANEOUS
  Filled 2020-01-12 (×4): qty 0.3

## 2020-01-12 MED ORDER — ONDANSETRON HCL 4 MG PO TABS: 4.0000 mg | ORAL_TABLET | Freq: Four times a day (QID) | ORAL | Status: DC | PRN

## 2020-01-12 MED ORDER — LISINOPRIL 20 MG PO TABS
40.0000 mg | ORAL_TABLET | Freq: Every day | ORAL | Status: DC
  Administered 2020-01-13 – 2020-01-14 (×2): 40 mg via ORAL
  Filled 2020-01-12 (×2): qty 2

## 2020-01-12 MED ORDER — OXYCODONE HCL 5 MG PO TABS
ORAL_TABLET | ORAL | Status: AC
  Administered 2020-01-12: 5 mg via ORAL
  Filled 2020-01-12: qty 1

## 2020-01-12 MED ORDER — LIDOCAINE HCL (CARDIAC) PF 100 MG/5ML IV SOSY
PREFILLED_SYRINGE | INTRAVENOUS | Status: DC | PRN
  Administered 2020-01-12: 60 mg via INTRAVENOUS

## 2020-01-12 MED ORDER — GABAPENTIN 400 MG PO CAPS
800.0000 mg | ORAL_CAPSULE | Freq: Four times a day (QID) | ORAL | Status: DC
  Administered 2020-01-12 – 2020-01-14 (×7): 800 mg via ORAL
  Filled 2020-01-12 (×8): qty 2

## 2020-01-12 MED ORDER — DEXAMETHASONE SODIUM PHOSPHATE 10 MG/ML IJ SOLN: 8.0000 mg | Freq: Once | INTRAMUSCULAR | Status: AC

## 2020-01-12 MED ORDER — VENLAFAXINE HCL ER 150 MG PO CP24
150.0000 mg | ORAL_CAPSULE | Freq: Every day | ORAL | Status: DC
  Administered 2020-01-13 – 2020-01-14 (×2): 150 mg via ORAL
  Filled 2020-01-12 (×2): qty 1

## 2020-01-12 MED ORDER — SODIUM CHLORIDE 0.9 % IV SOLN
INTRAVENOUS | Status: DC | PRN
  Administered 2020-01-12: 60 mL

## 2020-01-12 MED ORDER — METOCLOPRAMIDE HCL 10 MG PO TABS: 5.0000 mg | ORAL_TABLET | Freq: Three times a day (TID) | ORAL | Status: DC | PRN

## 2020-01-12 MED ORDER — GABAPENTIN 300 MG PO CAPS: 300.0000 mg | ORAL_CAPSULE | Freq: Once | ORAL | Status: DC

## 2020-01-12 MED ORDER — AMLODIPINE BESYLATE 10 MG PO TABS
10.0000 mg | ORAL_TABLET | Freq: Every day | ORAL | Status: DC
  Administered 2020-01-13 – 2020-01-14 (×2): 10 mg via ORAL
  Filled 2020-01-12 (×2): qty 1

## 2020-01-12 MED ORDER — FENTANYL CITRATE (PF) 100 MCG/2ML IJ SOLN
25.0000 ug | INTRAMUSCULAR | Status: DC | PRN
  Administered 2020-01-12 (×3): 25 ug via INTRAVENOUS

## 2020-01-12 MED ORDER — TRANEXAMIC ACID-NACL 1000-0.7 MG/100ML-% IV SOLN
1000.0000 mg | INTRAVENOUS | Status: AC
  Administered 2020-01-12: 1000 mg via INTRAVENOUS

## 2020-01-12 MED ORDER — FENTANYL CITRATE (PF) 100 MCG/2ML IJ SOLN
INTRAMUSCULAR | Status: AC
  Administered 2020-01-12: 11:00:00 25 ug via INTRAVENOUS
  Filled 2020-01-12: qty 2

## 2020-01-12 MED ORDER — ONDANSETRON HCL 4 MG/2ML IJ SOLN: 4.0000 mg | Freq: Four times a day (QID) | INTRAMUSCULAR | Status: DC | PRN

## 2020-01-12 MED ORDER — PROPOFOL 10 MG/ML IV BOLUS
INTRAVENOUS | Status: DC | PRN
  Administered 2020-01-12: 130 mg via INTRAVENOUS

## 2020-01-12 MED ORDER — EPHEDRINE SULFATE 50 MG/ML IJ SOLN
INTRAMUSCULAR | Status: DC | PRN
  Administered 2020-01-12: 10 mg via INTRAVENOUS

## 2020-01-12 MED ORDER — ATORVASTATIN CALCIUM 20 MG PO TABS
40.0000 mg | ORAL_TABLET | Freq: Every day | ORAL | Status: DC
  Administered 2020-01-13 – 2020-01-14 (×2): 40 mg via ORAL
  Filled 2020-01-12 (×2): qty 2

## 2020-01-12 MED ORDER — EPHEDRINE 5 MG/ML INJ
INTRAVENOUS | Status: AC
  Filled 2020-01-12: qty 10

## 2020-01-12 MED ORDER — ONDANSETRON HCL 4 MG/2ML IJ SOLN: 4.0000 mg | Freq: Once | INTRAMUSCULAR | Status: DC | PRN

## 2020-01-12 MED ORDER — SEVOFLURANE IN SOLN
RESPIRATORY_TRACT | Status: AC
  Filled 2020-01-12: qty 250

## 2020-01-12 MED ORDER — ACETAMINOPHEN 10 MG/ML IV SOLN
1000.0000 mg | Freq: Four times a day (QID) | INTRAVENOUS | Status: AC
  Administered 2020-01-12 – 2020-01-13 (×4): 1000 mg via INTRAVENOUS
  Filled 2020-01-12 (×4): qty 100

## 2020-01-12 MED ORDER — ALBUTEROL SULFATE (2.5 MG/3ML) 0.083% IN NEBU: 2.5000 mg | INHALATION_SOLUTION | RESPIRATORY_TRACT | Status: DC | PRN

## 2020-01-12 MED ORDER — ACETAMINOPHEN 10 MG/ML IV SOLN
INTRAVENOUS | Status: AC
  Filled 2020-01-12: qty 100

## 2020-01-12 MED ORDER — MIDAZOLAM HCL 2 MG/2ML IJ SOLN
INTRAMUSCULAR | Status: DC | PRN
  Administered 2020-01-12: 1 mg via INTRAVENOUS

## 2020-01-12 MED ORDER — ENSURE ENLIVE PO LIQD: 296.0000 mL | Freq: Once | ORAL | Status: DC

## 2020-01-12 MED ORDER — FENTANYL CITRATE (PF) 100 MCG/2ML IJ SOLN
INTRAMUSCULAR | Status: DC | PRN
  Administered 2020-01-12 (×4): 50 ug via INTRAVENOUS

## 2020-01-12 MED ORDER — PHENOL 1.4 % MT LIQD: 1.0000 | OROMUCOSAL | Status: DC | PRN

## 2020-01-12 MED ORDER — ROCURONIUM BROMIDE 100 MG/10ML IV SOLN
INTRAVENOUS | Status: DC | PRN
  Administered 2020-01-12: 30 mg via INTRAVENOUS
  Administered 2020-01-12: 50 mg via INTRAVENOUS

## 2020-01-12 MED ORDER — FLEET ENEMA 7-19 GM/118ML RE ENEM
1.0000 | ENEMA | Freq: Once | RECTAL | Status: AC | PRN
  Administered 2020-01-14: 1 via RECTAL

## 2020-01-12 MED ORDER — TRANEXAMIC ACID-NACL 1000-0.7 MG/100ML-% IV SOLN
INTRAVENOUS | Status: AC
  Filled 2020-01-12: qty 100

## 2020-01-12 MED ORDER — ACETAMINOPHEN 325 MG PO TABS: 325.0000 mg | ORAL_TABLET | Freq: Four times a day (QID) | ORAL | Status: DC | PRN

## 2020-01-12 MED ORDER — MAGNESIUM HYDROXIDE 400 MG/5ML PO SUSP
30.0000 mL | Freq: Every day | ORAL | Status: DC
  Administered 2020-01-13 – 2020-01-14 (×2): 30 mL via ORAL
  Filled 2020-01-12 (×2): qty 30

## 2020-01-12 MED ORDER — SENNOSIDES-DOCUSATE SODIUM 8.6-50 MG PO TABS
1.0000 | ORAL_TABLET | Freq: Two times a day (BID) | ORAL | Status: DC
  Administered 2020-01-12 – 2020-01-14 (×4): 1 via ORAL
  Filled 2020-01-12 (×4): qty 1

## 2020-01-12 SURGICAL SUPPLY — 73 items
ATTUNE MED DOME PAT 38 KNEE (Knees) ×1 IMPLANT
ATTUNE PS FEM LT SZ 5 CEM KNEE (Femur) ×1 IMPLANT
ATTUNE PSRP INSR SZ5 5 KNEE (Insert) ×1 IMPLANT
BASE TIBIA ATTUNE KNEE SYS SZ6 (Knees) IMPLANT
BATTERY INSTRU NAVIGATION (MISCELLANEOUS) ×8 IMPLANT
BLADE SAW 70X12.5 (BLADE) ×2 IMPLANT
BLADE SAW 90X13X1.19 OSCILLAT (BLADE) ×2 IMPLANT
BLADE SAW 90X25X1.19 OSCILLAT (BLADE) ×2 IMPLANT
CANISTER SUCT 3000ML PPV (MISCELLANEOUS) ×2 IMPLANT
CEMENT HV SMART SET (Cement) ×2 IMPLANT
COOLER POLAR GLACIER W/PUMP (MISCELLANEOUS) ×2 IMPLANT
COVER WAND RF STERILE (DRAPES) ×2 IMPLANT
CUFF TOURN SGL QUICK 24 (TOURNIQUET CUFF)
CUFF TOURN SGL QUICK 30 (TOURNIQUET CUFF) ×1
CUFF TRNQT CYL 24X4X16.5-23 (TOURNIQUET CUFF) IMPLANT
CUFF TRNQT CYL 30X4X21-28X (TOURNIQUET CUFF) IMPLANT
DRAPE 3/4 80X56 (DRAPES) ×2 IMPLANT
DRSG DERMACEA 8X12 NADH (GAUZE/BANDAGES/DRESSINGS) ×2 IMPLANT
DRSG OPSITE POSTOP 4X14 (GAUZE/BANDAGES/DRESSINGS) ×2 IMPLANT
DRSG TEGADERM 4X4.75 (GAUZE/BANDAGES/DRESSINGS) ×2 IMPLANT
DURAPREP 26ML APPLICATOR (WOUND CARE) ×3 IMPLANT
ELECT REM PT RETURN 9FT ADLT (ELECTROSURGICAL) ×2
ELECTRODE REM PT RTRN 9FT ADLT (ELECTROSURGICAL) ×1 IMPLANT
EX-PIN ORTHOLOCK NAV 4X150 (PIN) ×4 IMPLANT
GLOVE BIO SURGEON STRL SZ7.5 (GLOVE) ×4 IMPLANT
GLOVE BIOGEL M STRL SZ7.5 (GLOVE) ×4 IMPLANT
GLOVE BIOGEL PI IND STRL 7.5 (GLOVE) ×1 IMPLANT
GLOVE BIOGEL PI INDICATOR 7.5 (GLOVE) ×1
GLOVE INDICATOR 8.0 STRL GRN (GLOVE) ×2 IMPLANT
GOWN STRL REUS W/ TWL LRG LVL3 (GOWN DISPOSABLE) ×2 IMPLANT
GOWN STRL REUS W/ TWL XL LVL3 (GOWN DISPOSABLE) ×1 IMPLANT
GOWN STRL REUS W/TWL LRG LVL3 (GOWN DISPOSABLE) ×2
GOWN STRL REUS W/TWL XL LVL3 (GOWN DISPOSABLE) ×1
HEMOVAC 400CC 10FR (MISCELLANEOUS) ×2 IMPLANT
HOLDER FOLEY CATH W/STRAP (MISCELLANEOUS) ×2 IMPLANT
HOOD PEEL AWAY FLYTE STAYCOOL (MISCELLANEOUS) ×4 IMPLANT
KIT TURNOVER KIT A (KITS) ×2 IMPLANT
KNIFE SCULPS 14X20 (INSTRUMENTS) ×2 IMPLANT
LABEL OR SOLS (LABEL) ×2 IMPLANT
MANIFOLD NEPTUNE II (INSTRUMENTS) ×2 IMPLANT
NDL SAFETY ECLIPSE 18X1.5 (NEEDLE) ×1 IMPLANT
NDL SPNL 20GX3.5 QUINCKE YW (NEEDLE) ×2 IMPLANT
NEEDLE HYPO 18GX1.5 SHARP (NEEDLE) ×1
NEEDLE SPNL 20GX3.5 QUINCKE YW (NEEDLE) ×4 IMPLANT
NS IRRIG 500ML POUR BTL (IV SOLUTION) ×2 IMPLANT
PACK TOTAL KNEE (MISCELLANEOUS) ×2 IMPLANT
PAD WRAPON POLAR KNEE (MISCELLANEOUS) ×1 IMPLANT
PENCIL SMOKE EVACUATOR COATED (MISCELLANEOUS) ×2 IMPLANT
PENCIL SMOKE ULTRAEVAC 22 CON (MISCELLANEOUS) ×2 IMPLANT
PIN DRILL QUICK PACK ×2 IMPLANT
PIN FIXATION 1/8DIA X 3INL (PIN) ×6 IMPLANT
PULSAVAC PLUS IRRIG FAN TIP (DISPOSABLE) ×2
SOL .9 NS 3000ML IRR  AL (IV SOLUTION) ×1
SOL .9 NS 3000ML IRR UROMATIC (IV SOLUTION) ×1 IMPLANT
SOL PREP PVP 2OZ (MISCELLANEOUS) ×2
SOLUTION PREP PVP 2OZ (MISCELLANEOUS) ×1 IMPLANT
SPONGE DRAIN TRACH 4X4 STRL 2S (GAUZE/BANDAGES/DRESSINGS) ×2 IMPLANT
STAPLER SKIN PROX 35W (STAPLE) ×2 IMPLANT
STOCKINETTE IMPERV 14X48 (MISCELLANEOUS) IMPLANT
STRAP TIBIA SHORT (MISCELLANEOUS) ×2 IMPLANT
SUCTION FRAZIER HANDLE 10FR (MISCELLANEOUS) ×1
SUCTION TUBE FRAZIER 10FR DISP (MISCELLANEOUS) ×1 IMPLANT
SUT VIC AB 0 CT1 36 (SUTURE) ×4 IMPLANT
SUT VIC AB 1 CT1 36 (SUTURE) ×4 IMPLANT
SUT VIC AB 2-0 CT2 27 (SUTURE) ×2 IMPLANT
SYR 20ML LL LF (SYRINGE) ×2 IMPLANT
SYR 30ML LL (SYRINGE) ×4 IMPLANT
TIBIA ATTUNE KNEE SYS BASE SZ6 (Knees) ×2 IMPLANT
TIP FAN IRRIG PULSAVAC PLUS (DISPOSABLE) ×1 IMPLANT
TOWEL OR 17X26 4PK STRL BLUE (TOWEL DISPOSABLE) ×2 IMPLANT
TOWER CARTRIDGE SMART MIX (DISPOSABLE) ×2 IMPLANT
TRAY FOLEY MTR SLVR 16FR STAT (SET/KITS/TRAYS/PACK) ×2 IMPLANT
WRAPON POLAR PAD KNEE (MISCELLANEOUS) ×2

## 2020-01-12 NOTE — Anesthesia Procedure Notes (Signed)
Procedure Name: Intubation Date/Time: 01/12/2020 7:29 AM Performed by: Gentry Fitz, CRNA Pre-anesthesia Checklist: Patient identified, Emergency Drugs available, Suction available and Patient being monitored Patient Re-evaluated:Patient Re-evaluated prior to induction Oxygen Delivery Method: Circle system utilized Preoxygenation: Pre-oxygenation with 100% oxygen Induction Type: IV induction Ventilation: Mask ventilation without difficulty Laryngoscope Size: McGraph and 3 Grade View: Grade I Tube type: Oral Tube size: 7.0 mm Number of attempts: 1 Airway Equipment and Method: Stylet Placement Confirmation: ETT inserted through vocal cords under direct vision,  positive ETCO2 and breath sounds checked- equal and bilateral Secured at: 22 cm Tube secured with: Tape Dental Injury: Teeth and Oropharynx as per pre-operative assessment  Future Recommendations: Recommend- induction with short-acting agent, and alternative techniques readily available

## 2020-01-12 NOTE — Progress Notes (Signed)
Pain medication given per order and policy. Refused bone foam. Pt called [facility] to speak with MD and Pt berated this Probation officer. Polarcare filled, foot pumps, towel rolls under ankle.
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
## 2020-01-12 NOTE — Evaluation (Signed)
Physical Therapy Evaluation Patient Details Name: Lisa Good MRN: [id number] DOB: 1951-09-02 Today's Date: 01/12/2020   History of Present Illness  Per chart review: Pt is a 69 y/o F with degenerative arthrosis of the left knee and is s/p elective L TKA. PMH includes: HTN, anxiety, depression, GERD, HLD, peripheral neuropathy, R TKA, and spinal fusion.    Clinical Impression  Pt was lethargic but pleasant and motivated to participate during the session. On several occasions t/o the session, pt fell asleep but was easily awoken. Pt stated that she did not sleep at all last night and was tired from that. Pt denied any symptoms besides knee pain and feeling tired t/o the session. Pt was found on supplemental O2 and SpO2 and HR were WNL t/o session. Pt was able to complete bed mobility, transfers, and ambulation without physical assistance and demonstrated good understanding of cueing for sequencing. Pt will benefit from HHPT services upon discharge to safely address deficits listed in patient problem list for decreased caregiver assistance and eventual return to PLOF.       Follow Up Recommendations Home health PT;Supervision for mobility/OOB    Equipment Recommendations  None recommended by PT    Recommendations for Other Services       Precautions / Restrictions Precautions Precautions: Knee;Fall Precaution Booklet Issued: Yes (comment) Required Braces or Orthoses: (Able to complete SLR w/out extensor lag, knee immobilizer not needed) Restrictions Weight Bearing Restrictions: Yes LLE Weight Bearing: Weight bearing as tolerated      Mobility  Bed Mobility Overal bed mobility: Needs Assistance Bed Mobility: Supine to Sit;Sit to Supine     Supine to sit: Supervision;HOB elevated Sit to supine: Supervision   General bed mobility comments: No physical assistance or cueing needed but stand by assist secondary to lethragy  Transfers Overall transfer level: Needs  assistance Equipment used: Rolling walker (2 wheeled) Transfers: Sit to/from Stand Sit to Stand: Min guard         General transfer comment: No physical assistance needed. Cueing provided with foot placement for stand to sit transfer  Ambulation/Gait Ambulation/Gait assistance: Min guard Gait Distance (Feet): 5 Feet Assistive device: Rolling walker (2 wheeled) Gait Pattern/deviations: Step-to pattern;Decreased step length - right;Decreased step length - left;Antalgic     General Gait Details: Pt took several small steps from EOB to the recliner. Pt provided cueing for RW use and LLE positioning  Stairs            Wheelchair Mobility    Modified Rankin (Stroke Patients Only)       Balance Overall balance assessment: Needs assistance Sitting-balance support: Feet supported;No upper extremity supported Sitting balance-Leahy Scale: Fair     Standing balance support: Bilateral upper extremity supported;During functional activity Standing balance-Leahy Scale: Fair Standing balance comment: Mod BUE assit through RW during amb. In static standing, pt able to stand with light assist from a single UE through the RW                             Pertinent Vitals/Pain Pain Assessment: 0-10 Pain Score: 7  Pain Location: L knee Pain Descriptors / Indicators: Aching;Sore Pain Intervention(s): Limited activity within patient's tolerance;Monitored during session;Repositioned;Ice applied    Home Living Family/patient expects to be discharged to:: Private residence Living Arrangements: Alone(with 5 small dogs) Available Help at Discharge: Family(with prompting, pt reports "ex husband" is caring for her dogs for a couple days and she has a dtr Investment banker, corporate)  who may be able to help very little) Type of Home: House Home Access: Stairs to enter Entrance Stairs-Rails: None Entrance Stairs-Number of Steps: 2 Home Layout: Multi-level;Able to live on main level with  bedroom/bathroom Home Equipment: Gilford Rile - 2 wheels;Walker - 4 wheels;Bedside commode;Shower seat - built in;Cane - single point      Prior Function Level of Independence: Independent with assistive device(s)         Comments: Pt stated that when she is out in the community, she tends to push a "buggy". Ind with ADL's, 1 fall 4 months ago when she tripped over a rug     Hand Dominance   Dominant Hand: Right    Extremity/Trunk Assessment   Upper Extremity Assessment Upper Extremity Assessment: Overall WFL for tasks assessed    Lower Extremity Assessment Lower Extremity Assessment: Generalized weakness;LLE deficits/detail;Defer to PT evaluation LLE Deficits / Details: expected post-op strength/ROM deficits LLE Sensation: WNL;history of peripheral neuropathy       Communication   Communication: No difficulties  Cognition Arousal/Alertness: Lethargic;Suspect due to medications Behavior During Therapy: [facility] for tasks assessed/performed Overall Cognitive Status: Within Functional Limits for tasks assessed                                 General Comments: Pt sleepy, requires occasional cues to improve alertness, oriented, follows commands      General Comments      Exercises Total Joint Exercises Ankle Circles/Pumps: AROM;Strengthening;Both;10 reps Quad Sets: Strengthening;Both;10 reps Gluteal Sets: Strengthening;Both;10 reps Straight Leg Raises: AROM;Strengthening;Both;5 reps Long Arc Quad: AROM;Both;10 reps Knee Flexion: AROM;Both;10 reps Goniometric ROM: L knee AROM: 1-77 deg Marching in Standing: AROM;Strengthening;Both;10 reps Other Exercises Other Exercises: HEP education Other Exercises: Education for WBS Other Exercises: standing weight shifting Other Exercises: education on foot placement during transfers Other Exercises: Pt instructed in falls prevention, pet care considerations and strategies, home/routines modifications, AE/DME for ADL,  compression stocking mgt, and polar care mgt - handout provided to support recall and carryover   Assessment/Plan    PT Assessment Patient needs continued PT services  PT Problem List Decreased strength;Decreased mobility;Decreased safety awareness;Decreased range of motion;Decreased knowledge of precautions;Decreased activity tolerance;Decreased balance;Decreased knowledge of use of DME;Pain       PT Treatment Interventions DME instruction;Therapeutic exercise;Gait training;Balance training;Stair training;Functional mobility training;Therapeutic activities;Patient/family education    PT Goals (Current goals can be found in the Care Plan section)  Acute Rehab PT Goals Patient Stated Goal: go home and return to PLOF PT Goal Formulation: With patient Time For Goal Achievement: 01/25/20 Potential to Achieve Goals: Good    Frequency BID   Barriers to discharge Decreased caregiver support;Other (comment)(5 dogs) Pt stated that she is on her own and has 5 dogs (including 1 puppy)    Co-evaluation               AM-PAC PT "6 Clicks" Mobility  Outcome Measure Help needed turning from your back to your side while in a flat bed without using bedrails?: A Little Help needed moving from lying on your back to sitting on the side of a flat bed without using bedrails?: A Little Help needed moving to and from a bed to a chair (including a wheelchair)?: A Little Help needed standing up from a chair using your arms (e.g., wheelchair or bedside chair)?: A Little Help needed to walk in hospital room?: A Little Help needed climbing 3-5 steps with a  railing? : A Lot 6 Click Score: 17    End of Session Equipment Utilized During Treatment: Gait belt;Oxygen Activity Tolerance: Patient tolerated treatment well Patient left: in chair;with call bell/phone within reach;with chair alarm set;with SCD's reapplied(Polar care applied) Nurse Communication: Mobility status;Precautions;Weight bearing  status PT Visit Diagnosis: Other abnormalities of gait and mobility (R26.89);Muscle weakness (generalized) (M62.81);Pain Pain - Right/Left: Left Pain - part of body: Knee    Time: 1402-1501 PT Time Calculation (min) (ACUTE ONLY): 59 min   Charges:              Annabelle Harman, SPT 01/12/20 5:25 PM

## 2020-01-12 NOTE — Evaluation (Signed)
Occupational Therapy Evaluation Patient Details Name: Lisa Good MRN: [id number] DOB: Feb 25, 1951 Today's Date: 01/12/2020    History of Present Illness Per chart review: Pt is a 69 y/o F with degenerative arthrosis of the left knee and is s/p elective L TKA. PMH includes: HTN, anxiety, depression, GERD, HLD, peripheral neuropathy, R TKA, and spinal fusion.   Clinical Impression   Pt seen for OT evaluation this date, POD#1 from above surgery. Pt was independent in all ADL prior to surgery and is eager to return to PLOF with less pain and improved safety and independence. Pt lives alone with her 5 small poodles in a 2 story home but is able to live on the main floor per report. 2 steps and no rails to enter home. Pt currently requires minimal assist for LB dressing and bathing while in seated position due to pain and limited AROM of L knee as well as Min-Mod assist for compression stockings and polar care mgt. Pt instructed in pet care considerations and strategies, polar care mgt, falls prevention strategies, home/routines modifications, DME/AE for LB bathing and dressing tasks, and compression stocking mgt. Handout provided to support recall and carryover. Pt drowsy during session - pt attributes it to recent medication. Pt would benefit from skilled OT services including additional instruction in dressing techniques with or without assistive devices for dressing and bathing skills to support recall and carryover prior to discharge and ultimately to maximize safety, independence, and minimize falls risk and caregiver burden. Recommend HHOT services at this time.      Follow Up Recommendations  Home health OT    Equipment Recommendations  3 in 1 bedside commode    Recommendations for Other Services       Precautions / Restrictions Precautions Precautions: Knee;Fall Precaution Booklet Issued: Yes (comment) Required Braces or Orthoses: (Able to complete SLR, knee immobilizer not  needed) Restrictions Weight Bearing Restrictions: Yes LLE Weight Bearing: Weight bearing as tolerated      Mobility Bed Mobility Overal bed mobility: Needs Assistance Bed Mobility: Supine to Sit;Sit to Supine     Supine to sit: Supervision;HOB elevated Sit to supine: Supervision   General bed mobility comments: No physical assistance or cueing needed  Transfers Overall transfer level: Needs assistance Equipment used: Rolling walker (2 wheeled) Transfers: Sit to/from Stand Sit to Stand: Min guard         General transfer comment: No physical assistance needed. Cueing provided with foot placement for stand to sit transfer    Balance Overall balance assessment: Needs assistance Sitting-balance support: Feet supported;No upper extremity supported Sitting balance-Leahy Scale: Fair     Standing balance support: Bilateral upper extremity supported;During functional activity Standing balance-Leahy Scale: Fair Standing balance comment: Mod BUE assit through RW during amb. In static standing, pt able to stand with light assist from a single UE through the RW                           ADL either performed or assessed with clinical judgement   ADL Overall ADL's : Needs assistance/impaired                                       General ADL Comments: CGA to Min A for LB ADL tasks from sit to stand position 2/2 difficulty reaching her feet to thread garments over feet, CGA for ADL mobility  Vision Baseline Vision/History: Wears glasses Wears Glasses: At all times Patient Visual Report: No change from baseline       Perception     Praxis      Pertinent Vitals/Pain Pain Assessment: 0-10 Pain Score: 7  Pain Location: L knee Pain Descriptors / Indicators: Aching;Sore Pain Intervention(s): Limited activity within patient's tolerance;Monitored during session;Repositioned;Ice applied     Hand Dominance Right   Extremity/Trunk Assessment  Upper Extremity Assessment Upper Extremity Assessment: Overall WFL for tasks assessed   Lower Extremity Assessment Lower Extremity Assessment: Generalized weakness;LLE deficits/detail;Defer to PT evaluation LLE Deficits / Details: expected post-op strength/ROM deficits LLE Sensation: WNL;history of peripheral neuropathy       Communication Communication Communication: No difficulties   Cognition Arousal/Alertness: Lethargic;Suspect due to medications Behavior During Therapy: WFL for tasks assessed/performed Overall Cognitive Status: Within Functional Limits for tasks assessed                                 General Comments: Pt sleepy, requires occasional cues to improve alertness, oriented, follows commands   General Comments       Exercises Other Exercises Other Exercises: Pt instructed in falls prevention, pet care considerations and strategies, home/routines modifications, AE/DME for ADL, compression stocking mgt, and polar care mgt - handout provided to support recall and carryover   Shoulder Instructions      Home Living Family/patient expects to be discharged to:: Private residence Living Arrangements: Alone(with 5 small dogs) Available Help at Discharge: Family(with prompting, pt reports "ex husband" is caring for her dogs for a couple days and she has a dtr Investment banker, corporate) who may be able to help very little) Type of Home: House Home Access: Stairs to enter [employer] of Steps: 2 Entrance Stairs-Rails: None Home Layout: Multi-level;Able to live on main level with bedroom/bathroom     Bathroom Shower/Tub: Walk-in shower   Bathroom Toilet: Handicapped height     Home Equipment: Environmental consultant - 2 wheels;Walker - 4 wheels;Bedside commode;Shower seat - built in;Cane - single point          Prior Functioning/Environment Level of Independence: Independent with assistive device(s)        Comments: Pt stated that when she is out in the community, she  tends to push a "buggy". Ind with ADL's, 1 fall 4 months ago when she tripped over a rug        OT Problem List: Decreased strength;Decreased range of motion;Decreased knowledge of use of DME or AE;Impaired balance (sitting and/or standing);Decreased safety awareness;Decreased knowledge of precautions;Pain      OT Treatment/Interventions: Self-care/ADL training;Therapeutic exercise;Therapeutic activities;DME and/or AE instruction;Patient/family education;Balance training    OT Goals(Current goals can be found in the care plan section) Acute Rehab OT Goals Patient Stated Goal: go home and return to PLOF OT Goal Formulation: With patient Time For Goal Achievement: 01/26/20 Potential to Achieve Goals: Good ADL Goals Pt Will Perform Lower Body Dressing: with supervision;sit to/from stand;with adaptive equipment Pt Will Transfer to Toilet: with supervision;ambulating(BSC over toilet, LRAD for amb) Additional ADL Goal #1: Pt will verbalize plan for managing compression stockings Additional ADL Goal #2: Pt will verbalize plan for managing polar care Additional ADL Goal #3: Pt will verbalize plan for pet care mgt for dogs  OT Frequency: Min 2X/week   Barriers to D/C: Decreased caregiver support          Co-evaluation  AM-PAC OT "6 Clicks" Daily Activity     Outcome Measure Help from another person eating meals?: None Help from another person taking care of personal grooming?: None Help from another person toileting, which includes using toliet, bedpan, or urinal?: A Little Help from another person bathing (including washing, rinsing, drying)?: A Little Help from another person to put on and taking off regular upper body clothing?: None Help from another person to put on and taking off regular lower body clothing?: A Little 6 Click Score: 21   End of Session    Activity Tolerance: Patient tolerated treatment well Patient left: in bed;with call bell/phone within  reach;with bed alarm set;with SCD's reapplied;Other (comment)(polar care in place)  OT Visit Diagnosis: Other abnormalities of gait and mobility (R26.89);Muscle weakness (generalized) (M62.81);Pain Pain - Right/Left: Left Pain - part of body: Knee                Time: 9355-2174 OT Time Calculation (min): 18 min Charges:  OT General Charges $OT Visit: 1 Visit OT Evaluation $OT Eval Moderate Complexity: 1 Mod OT Treatments $Self Care/Home Management : 8-22 mins  Jeni Salles, MPH, MS, OTR/L ascom [phone] 01/12/20, 4:34 PM

## 2020-01-12 NOTE — Transfer of Care (Signed)
Immediate Anesthesia Transfer of Care Note  Patient: Lisa Good  Procedure(s) Performed: COMPUTER ASSISTED TOTAL KNEE ARTHROPLASTY (Left Knee)  Patient Location: PACU  Anesthesia Type:General  Level of Consciousness: awake and drowsy  Airway & Oxygen Therapy: Patient Spontanous Breathing and Patient connected to face mask oxygen  Post-op Assessment: Report given to RN and Post -op Vital signs reviewed and stable  Post vital signs: Reviewed and stable  Last Vitals:  Vitals Value Taken Time  BP 141/61 01/12/20 1105  Temp 36.3 C 01/12/20 1105  Pulse 120 01/12/20 1109  Resp 16 01/12/20 1109  SpO2 97 % 01/12/20 1109  Vitals shown include unvalidated device data.  Last Pain:  Vitals:   01/12/20 0620  TempSrc: Tympanic  PainSc: 4          Complications: No apparent anesthesia complications

## 2020-01-12 NOTE — TOC Benefit Eligibility Note (Signed)
Transition of Care [facility]) Benefit Eligibility Note    Patient Details  Name: Lisa Good MRN: [id number] Date of Birth: [birth year]   Medication/Dose: Enoxaparin 32m once daily for 14 days  Covered?: Yes  Tier: Other(Tier 4)  Prescription Coverage Preferred Pharmacy: [facility]  Spoke with Person/Company/Phone Number:: Talisha with [employer] at [phone]  Co-Pay: $39.56 estimated copay for 30 day supply.  Prior Approval: Yes([place] Limit of 11 syringes for 28 day period.  PA required: [phone]  Deductible: Met($445 deductible met.  In initial coverage stage.)    HDannette BarbaraPhone Number: [phone] [phone]/06/2020, 3:59 PM

## 2020-01-12 NOTE — TOC Progression Note (Signed)
Transition of Care [facility]) - Progression Note    Patient Details  Name: Lisa Good MRN: [id number] Date of Birth: [birth year]  Transition of Care [facility]) CM/SW [place], RN Phone Number: 01/12/2020, 3:30 PM  Clinical Narrative:    Requested the price of Lovenox will provide price once obtained        Expected Discharge Plan and Services                                                 Social Determinants of Health (SDOH) Interventions    Readmission Risk Interventions No flowsheet data found.

## 2020-01-12 NOTE — Op Note (Signed)
OPERATIVE NOTE  DATE OF SURGERY:  01/12/2020  PATIENT NAME:  Lisa Good   DOB: Feb 10, 1951  MRN: [id number]  PRE-OPERATIVE DIAGNOSIS: Degenerative arthrosis of the left knee, primary  POST-OPERATIVE DIAGNOSIS:  Same  PROCEDURE:  Left total knee arthroplasty using computer-assisted navigation  SURGEON:  Marciano Sequin. M.D.  ANESTHESIA: general  ESTIMATED BLOOD LOSS: 50 mL  FLUIDS REPLACED: 1400 mL of crystalloid  TOURNIQUET TIME: 96 minutes  DRAINS: 2 medium Hemovac drains  SOFT TISSUE RELEASES: Anterior cruciate ligament, posterior cruciate ligament, deep medial collateral ligament, patellofemoral ligament  IMPLANTS UTILIZED: DePuy Attune size 5 posterior stabilized femoral component (cemented), size 6 rotating platform tibial component (cemented), 38 mm medialized dome patella (cemented), and a 5 mm stabilized rotating platform polyethylene insert.  INDICATIONS FOR SURGERY: Lisa Good is a 69 y.o. year old female with a long history of progressive knee pain. X-rays demonstrated severe degenerative changes in tricompartmental fashion. The patient had not seen any significant improvement despite conservative nonsurgical intervention. After discussion of the risks and benefits of surgical intervention, the patient expressed understanding of the risks benefits and agree with plans for total knee arthroplasty.   The risks, benefits, and alternatives were discussed at length including but not limited to the risks of infection, bleeding, nerve injury, stiffness, blood clots, the need for revision surgery, cardiopulmonary complications, among others, and they were willing to proceed.  PROCEDURE IN DETAIL: The patient was brought into the operating room and, after adequate general anesthesia was achieved, a tourniquet was placed on the patient's upper thigh. The patient's knee and leg were cleaned and prepped with alcohol and DuraPrep and draped in the usual sterile fashion. A  "timeout" was performed as per usual protocol. The lower extremity was exsanguinated using an Esmarch, and the tourniquet was inflated to 300 mmHg. An anterior longitudinal incision was made followed by a standard mid vastus approach. The deep fibers of the medial collateral ligament were elevated in a subperiosteal fashion off of the medial flare of the tibia so as to maintain a continuous soft tissue sleeve. The patella was subluxed laterally and the patellofemoral ligament was incised. Inspection of the knee demonstrated severe degenerative changes with full-thickness loss of articular cartilage. Osteophytes were debrided using a rongeur. Anterior and posterior cruciate ligaments were excised. Two 4.0 mm Schanz pins were inserted in the femur and into the tibia for attachment of the array of trackers used for computer-assisted navigation. Hip center was identified using a circumduction technique. Distal landmarks were mapped using the computer. The distal femur and proximal tibia were mapped using the computer. The distal femoral cutting guide was positioned using computer-assisted navigation so as to achieve a 5 distal valgus cut. The femur was sized and it was felt that a size 5 femoral component was appropriate. A size 5 femoral cutting guide was positioned and the anterior cut was performed and verified using the computer. This was followed by completion of the posterior and chamfer cuts. Femoral cutting guide for the central box was then positioned in the center box cut was performed.  Attention was then directed to the proximal tibia. Medial and lateral menisci were excised. The extramedullary tibial cutting guide was positioned using computer-assisted navigation so as to achieve a 0 varus-valgus alignment and 3 posterior slope. The cut was performed and verified using the computer. The proximal tibia was sized and it was felt that a size 6 tibial tray was appropriate. Tibial and femoral trials were  inserted followed by insertion  of a 5 mm polyethylene insert. This allowed for excellent mediolateral soft tissue balancing both in flexion and in full extension. Finally, the patella was cut and prepared so as to accommodate a 38 mm medialized dome patella. A patella trial was placed and the knee was placed through a range of motion with excellent patellar tracking appreciated. The femoral trial was removed after debridement of posterior osteophytes. The central post-hole for the tibial component was reamed followed by insertion of a keel punch. Tibial trials were then removed. Cut surfaces of bone were irrigated with copious amounts of normal saline with antibiotic solution using pulsatile lavage and then suctioned dry. Polymethylmethacrylate cement was prepared in the usual fashion using a vacuum mixer. Cement was applied to the cut surface of the proximal tibia as well as along the undersurface of a size 6 rotating platform tibial component. Tibial component was positioned and impacted into place. Excess cement was removed using [employer] fast streamer. Cement was then applied to the cut surfaces of the femur as well as along the posterior flanges of the size 5 femoral component. The femoral component was positioned and impacted into place. Excess cement was removed using [employer] fast streamer. A 5 mm polyethylene trial was inserted and the knee was brought into full extension with steady axial compression applied. Finally, cement was applied to the backside of a 38 mm medialized dome patella and the patellar component was positioned and patellar clamp applied. Excess cement was removed using [employer] fast streamer. After adequate curing of the cement, the tourniquet was deflated after a total tourniquet time of 96 minutes. Hemostasis was achieved using electrocautery. The knee was irrigated with copious amounts of normal saline with antibiotic solution using pulsatile lavage and then suctioned dry. 20 mL of 1.3% Exparel and 60 mL  of 0.25% Marcaine in 40 mL of normal saline was injected along the posterior capsule, medial and lateral gutters, and along the arthrotomy site. A 5 mm stabilized rotating platform polyethylene insert was inserted and the knee was placed through a range of motion with excellent mediolateral soft tissue balancing appreciated and excellent patellar tracking noted. 2 medium drains were placed in the wound bed and brought out through separate stab incisions. The medial parapatellar portion of the incision was reapproximated using interrupted sutures of #1 Vicryl. Subcutaneous tissue was approximated in layers using first #0 Vicryl followed #2-0 Vicryl. The skin was approximated with skin staples. A sterile dressing was applied.  The patient tolerated the procedure well and was transported to the recovery room in stable condition.    Leighanna Kirn P. Holley Bouche., M.D.

## 2020-01-12 NOTE — H&P (Signed)
The patient has been re-examined, and the chart reviewed, and there have been no interval changes to the documented history and physical.    The risks, benefits, and alternatives have been discussed at length. The patient expressed understanding of the risks benefits and agreed with plans for surgical intervention.  Gaylin Bulthuis P. Ra Pfiester, Jr. M.D.    

## 2020-01-12 NOTE — TOC Initial Note (Signed)
Transition of Care [facility]) - Initial/Assessment Note    Patient Details  Name: Lisa Good MRN: [id number] Date of Birth: 08/31/1951  [facility]) CM/SW Contact:    Su Hilt, RN Phone Number: 01/12/2020, 3:41 PM  Clinical Narrative:    Met with the patient to discuss DC plan and needs She lives alone with 5 small dogs, I asked if she has someone to help with the dogs, she said it was not needed I encouraged her to be careful with the dogs and do not trip over them, II asked who would provide transportation and she said she drives, and I explained That she will not be allowed to drive for a bit, she said her daughter would provide transportation, She has a RW at home and has had a BSC but did not need it, I explained that she would need to use Lovenox for 24 days and That I requested the price and it would be provided to her once obtained.  She is up to date with her PCP and [facility], she can afford her medication.  Will continue to monitor for needs                Expected Discharge Plan: [place] Barriers to Discharge: Continued Medical Work up   Patient Goals and CMS Choice Patient states their goals for this hospitalization and ongoing recovery are:: go home      Expected Discharge Plan and Services Expected Discharge Plan: [place]   Discharge Planning Services: CM Consult   Living arrangements for the past 2 months: Single Family Home                 DME Arranged: N/A         HH Arranged: PT HH Agency: [facility] [employer] Date [place]: 01/12/20 Time [place]: 29 Representative spoke with at [place]: [place] Arrangements/Services Living arrangements for the past 2 months: [place] with:: Self Patient language and need for interpreter reviewed:: Yes Do you feel safe going back to the place where you live?: Yes       Need for Family Participation in Patient Care: No (Comment) Care giver support system in place?: Yes (comment) Current home services: DME(RW, BSC) Criminal Activity/Legal Involvement Pertinent to Current Situation/Hospitalization: No - Comment as needed  Activities of Daily Living Home Assistive Devices/Equipment: None ADL Screening (condition at time of admission) Patient's cognitive ability adequate to safely complete daily activities?: Yes Is the patient deaf or have difficulty hearing?: No Does the patient have difficulty seeing, even when wearing glasses/contacts?: No Does the patient have difficulty concentrating, remembering, or making decisions?: No Patient able to express need for assistance with ADLs?: Yes Does the patient have difficulty dressing or bathing?: No Independently performs ADLs?: Yes (appropriate for developmental age) Does the patient have difficulty walking or climbing stairs?: Yes Weakness of Legs: Left Weakness of Arms/Hands: None  Permission Sought/Granted   Permission granted to share information with : Yes, Verbal Permission Granted              Emotional Assessment Appearance:: Appears stated age Attitude/Demeanor/Rapport: Engaged Affect (typically observed): Appropriate Orientation: : Oriented to  Time, Oriented to Situation, Oriented to Place, Oriented to Self Alcohol / Substance Use: Not Applicable Psych Involvement: No (comment)  Admission diagnosis:  Total knee replacement status [Z96.659] Patient Active Problem List  Diagnosis Date Noted  . 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 Chronic radicular lumbar pain 12/04/2019  . Iron deficiency anemia 11/01/2019  . Primary osteoarthritis of left knee 10/17/2019  . Primary osteoarthritis of right shoulder 10/17/2019  . Hx of cervical spine surgery 08/21/2019  . Pancolitis ([place]) 07/20/2019  . Kyphosis of cervical region 07/14/2019  . Cervical spondylosis with myelopathy 06/28/2019   . Chronic low back pain 06/28/2019  . Cervical spondylosis without myelopathy 11/09/2018  . Foraminal stenosis of cervical region 11/09/2018  . Cervical stenosis of spinal canal 11/09/2018  . History of fusion of lumbar spine 11/09/2018  . 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 Postlaminectomy syndrome of lumbosacral region 11/09/2018  . Chronic pain syndrome 11/09/2018  . Severe obesity (BMI 35.0-39.9) with comorbidity ([place]) 08/18/2018  . Primary osteoarthritis of right knee 02/17/2017  . Hyperlipidemia 12/02/2016  . Incontinence in female 10/05/2016  . Spondylolisthesis, lumbar region 05/05/2016  . 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  . OSA (obstructive sleep apnea) 05/31/2015  . Hypothyroidism 02/09/2014  . Sleep disorder 02/09/2014  . Asymptomatic hyperuricemia 08/29/2013  . Polyarthritis 08/29/2013  . Essential hypertension 06/28/2013  . SUI (stress urinary incontinence, female) 06/20/2013  . Insomnia 05/22/2013  . 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  . Peripheral neuropathy 05/25/2012  . Venous (peripheral) insufficiency 05/25/2012  . Depression 10/03/2009  . Restless legs syndrome (RLS) 10/03/2009  . Fibromyalgia 02/18/2009  . Gastro-esophageal reflux disease with esophagitis 10/23/2008  . Generalized anxiety disorder 10/23/2008  . GERD (gastroesophageal reflux disease) 10/23/2008  . Pure hypercholesterolemia 10/23/2008   PCP:  [name], MD Pharmacy[facility]   [id number] [place]Phone: [phone]: [phone]    Social Determinants of Health (SDOH) Interventions    Readmission Risk Interventions No flowsheet data found.

## 2020-01-13 NOTE — Progress Notes (Signed)
Physical Therapy Treatment Patient Details Name: Lisa Good MRN: [id number] DOB: October 24, 1950 Today's Date: 01/13/2020    History of Present Illness Per chart review: Pt is a 70 y/o F with degenerative arthrosis of the left knee and is s/p elective L TKA. PMH includes: HTN, anxiety, depression, GERD, HLD, peripheral neuropathy, R TKA, and spinal fusion.    PT Comments    Pt found in bed on 1L O2, 99% SpO2 at beginning of session. Reports decreased pain since yesterday. Pt stated that before beginning PT she needed to use the bathroom. Was able to transition from supine to sitting EOB mod I with bed rail assist. Pt performed standing pivot transfer towards R side with mod I, requiring VC and min BUE assist of RW. O2 removed for session, able to maintain SpO2 ~ 97%. Continued session with gait training. Pt able to ambulate 120 ft with RW. Required VC from PTA to maintain proper body positioning relevant to RW, decrease forward trunk lean and increase stride length. Pt reports increased discomfort with ambulation but states that it is not intolerable and that it feels good to move. Pt instructed with exercises to improve strengthening and ROM of L LE. L knee felxion ROM measured at 94 degrees. Pt left sitting up in chair with chair alarm in place and call bell near by. Nurse notified that pt not requiring continued O2. Pt will benefit from home health to continue to progress ROM, strength and return to prior level of function.  Follow Up Recommendations  Home health PT     Equipment Recommendations  None recommended by PT    Recommendations for Other Services       Precautions / Restrictions Precautions Precautions: Knee;Fall Precaution Booklet Issued: Yes (comment) Restrictions Weight Bearing Restrictions: Yes LLE Weight Bearing: Weight bearing as tolerated    Mobility  Bed Mobility Overal bed mobility: Modified Independent Bed Mobility: Supine to Sit     Supine to sit: Modified  independent (Device/Increase time);HOB elevated     General bed mobility comments: Pt able to exit R side of bed w/o need for physical assist from PTA.   Transfers Overall transfer level: Needs assistance Equipment used: Rolling walker (2 wheeled) Transfers: Sit to/from Stand Sit to Stand: Supervision         General transfer comment: Pt required cueing for hand placement when trasnfering.   Ambulation/Gait Ambulation/Gait assistance: Supervision Gait Distance (Feet): 120 Feet Assistive device: Rolling walker (2 wheeled) Gait Pattern/deviations: Decreased stride length;Trunk flexed;Antalgic Gait velocity: decreased    General Gait Details: Pt has tendency to push RW ahead of herself. Required VC to maintain correct positioning during ambulation.    Stairs             Wheelchair Mobility    Modified Rankin (Stroke Patients Only)       Balance Overall balance assessment: Needs assistance Sitting-balance support: Feet supported;No upper extremity supported Sitting balance-Leahy Scale: Good Sitting balance - Comments: No LOB with sitting EOB    Standing balance support: Bilateral upper extremity supported;During functional activity Standing balance-Leahy Scale: Good Standing balance comment: Pt able to maintain standing balance w/o use of UE support for ~ 1 min. Mod BUE assit through RW during amb.                              Cognition Arousal/Alertness: Awake/alert Behavior During Therapy: WFL for tasks assessed/performed Overall Cognitive Status: Within Functional Limits for tasks  assessed                                 General Comments: Pt A&O x 4 today. Able to follow commands and participate in exercises as tol.       Exercises Total Joint Exercises Ankle Circles/Pumps: AROM;Strengthening;Both;20 reps Quad Sets: Left;Strengthening;10 reps Short Arc Quad: Left;Strengthening;10 reps Heel Slides: AAROM;Left;15 reps Straight  Leg Raises: AROM;Strengthening;10 reps;Left Goniometric ROM: L knee, 94 degrees flexion.     General Comments General comments (skin integrity, edema, etc.): Post-surgical edema in L LE       Pertinent Vitals/Pain Pain Assessment: 0-10 Pain Score: 6  Pain Location: L knee Pain Descriptors / Indicators: Aching;Sore Pain Intervention(s): Premedicated before session;Ice applied;Limited activity within patient's tolerance;Monitored during session    Home Living                      Prior Function            PT Goals (current goals can now be found in the care plan section) Acute Rehab PT Goals Patient Stated Goal: go home and return to PLOF PT Goal Formulation: With patient Time For Goal Achievement: 01/25/20 Potential to Achieve Goals: Good Progress towards PT goals: Progressing toward goals    Frequency    BID      PT Plan Current plan remains appropriate    Co-evaluation              AM-PAC PT "6 Clicks" Mobility   Outcome Measure  Help needed turning from your back to your side while in a flat bed without using bedrails?: None Help needed moving from lying on your back to sitting on the side of a flat bed without using bedrails?: A Little Help needed moving to and from a bed to a chair (including a wheelchair)?: A Little Help needed standing up from a chair using your arms (e.g., wheelchair or bedside chair)?: None Help needed to walk in hospital room?: A Little Help needed climbing 3-5 steps with a railing? : A Little 6 Click Score: 20    End of Session Equipment Utilized During Treatment: Gait belt Activity Tolerance: Patient tolerated treatment well Patient left: in chair;with call bell/phone within reach;with chair alarm set Nurse Communication: Mobility status;Precautions;Weight bearing status PT Visit Diagnosis: Other abnormalities of gait and mobility (R26.89);Muscle weakness (generalized) (M62.81);Pain Pain - Right/Left: Left Pain -  part of body: Knee     Time: 1140-1200 PT Time Calculation (min) (ACUTE ONLY): 20 min  Charges:  $Gait Training: 8-22 mins                    Julaine Fusi PTA 01/13/20, 1:22 PM

## 2020-01-13 NOTE — Progress Notes (Signed)
   Subjective: 1 Day Post-Op Procedure(s) (LRB): COMPUTER ASSISTED TOTAL KNEE ARTHROPLASTY (Left) Patient reports pain as moderate.   Patient is well, and has had no acute complaints or problems Denies any CP, SOB, ABD pain. We will continue therapy today.  Plan is to go Home after hospital stay.  Objective: Vital signs in last 24 hours: Temp:  [97.4 F (36.3 C)-98.5 F (36.9 C)] 97.8 F (36.6 C) (04/10 0431) Pulse Rate:  [85-117] 85 (04/10 0431) Resp:  [9-29] 18 (04/10 0431) BP: (109-144)/(53-78) 134/65 (04/10 0431) SpO2:  [94 %-100 %] 100 % (04/10 0431)  Intake/Output from previous day: 04/09 0701 - 04/10 0700 In: 3163.5 [P.O.:240; I.V.:2423.5; IV Piggyback:500] Out: 1120 [Urine:900; Drains:170; Blood:50] Intake/Output this shift: No intake/output data recorded.  No results for input(s): HGB in the last 72 hours. No results for input(s): WBC, RBC, HCT, PLT in the last 72 hours. No results for input(s): NA, K, CL, CO2, BUN, CREATININE, GLUCOSE, CALCIUM in the last 72 hours. No results for input(s): LABPT, INR in the last 72 hours.  EXAM General - Patient is Alert, Appropriate and Oriented Extremity - Neurovascular intact Sensation intact distally Intact pulses distally Dorsiflexion/Plantar flexion intact No cellulitis present Compartment soft  Bone foam in place, knee in full extension Dressing - dressing C/D/I and no drainage, Hemovac intact without drainage Motor Function - intact, moving foot and toes well on exam.   Past Medical History:  Diagnosis Date  . Allergy    seasonal  . Arthritis   . Depression   . GERD (gastroesophageal reflux disease)   . Hyperlipidemia   . Hypertension   . Iron deficiency anemia 11/01/2019  . Neuromuscular disorder ([place])    peripheral neuropathy/feet  . Sleep apnea    CPAP     Assessment/Plan:   1 Day Post-Op Procedure(s) (LRB): COMPUTER ASSISTED TOTAL KNEE ARTHROPLASTY (Left) Active Problems:   Total knee replacement  status  Estimated body mass index is 32.61 kg/m as calculated from the following:   Height as of this encounter: 5' 3"  (1.6 m).   Weight as of this encounter: 83.5 kg. Advance diet Up with therapy  Needs bowel movement Vital signs are stable Pain controlled. We will remove Hemovac and change dressing on Sunday. Care management to assist with discharge with home with home health PT  DVT Prophylaxis - Lovenox, Foot Pumps and TED hose Weight-Bearing as tolerated to left leg   T. Rachelle Hora, PA-C [place] 01/13/2020, 7:02 AM

## 2020-01-13 NOTE — Progress Notes (Signed)
Physical Therapy Treatment Patient Details Name: Lisa Good MRN: [id number] DOB: 11-19-1950 Today's Date: 01/13/2020    History of Present Illness Per chart review: Pt is a 69 y/o F with degenerative arthrosis of the left knee and is s/p elective L TKA. PMH includes: HTN, anxiety, depression, GERD, HLD, peripheral neuropathy, R TKA, and spinal fusion.    PT Comments    Pt found seated in chair upon arrival. States that pain in L knee is a 5/10 on NPS, has remained about the same since this AM. Pt was able to ambulate 200 ft with RW this session. Continues to require VC to decrease forward trunk lean while ambulating, but shows improvement in body awareness relative to walker. Pt demonstrated ability to manipulate 4 stairs with use of 1 hand rail on R side in order to mimic home environment and ensure safe return home. Pt was able to perform all transfers and bed mobility at a mod I level. Pt was left in bed with alarm on and call bell near by at conclusion of treatment session. Ice reapplied. Pt would benefit from home PT following d/c to continue to improve upon ROM, strength and functional gait pattern in order to facilitate return to PLOF.     Follow Up Recommendations  Home health PT     Equipment Recommendations  None recommended by PT    Recommendations for Other Services       Precautions / Restrictions Precautions Precautions: Knee;Fall Precaution Booklet Issued: Yes (comment) Restrictions Weight Bearing Restrictions: Yes LLE Weight Bearing: Weight bearing as tolerated    Mobility  Bed Mobility Overal bed mobility: Modified Independent Bed Mobility: Supine to Sit;Sit to Supine     Supine to sit: Modified independent (Device/Increase time);HOB elevated Sit to supine: HOB elevated;Modified independent (Device/Increase time)   General bed mobility comments: Pt able to perform all bed mobility w/o need for physical assist.   Transfers Overall transfer level: Needs  assistance Equipment used: Rolling walker (2 wheeled) Transfers: Sit to/from Stand Sit to Stand: Supervision         General transfer comment: Pt did well with transfers. Used BUE's on chair arms to transition from sit to stand.  Ambulation/Gait Ambulation/Gait assistance: Supervision Gait Distance (Feet): 200 Feet Assistive device: Rolling walker (2 wheeled) Gait Pattern/deviations: Decreased stride length;Trunk flexed;Antalgic Gait velocity: decreased    General Gait Details: Pt continues to require VC to decrease forward trunk lean. Shows improvement of body awareness relative to RW as compared to this AM.    Stairs Stairs: Yes Stairs assistance: Min guard Stair Management: One rail Right Number of Stairs: 4 General stair comments: Pt doesn't have railings at home, but does have single post on R side of stairs she uses when ascending/descending steps. Simulated home environment wi/ use of 1 HR.   Wheelchair Mobility    Modified Rankin (Stroke Patients Only)       Balance Overall balance assessment: Needs assistance Sitting-balance support: Feet supported;No upper extremity supported Sitting balance-Leahy Scale: Good Sitting balance - Comments: No LOB sitting.    Standing balance support: Bilateral upper extremity supported;During functional activity Standing balance-Leahy Scale: Good Standing balance comment: Mod BUE assit through RW during amb.                              Cognition Arousal/Alertness: Awake/alert Behavior During Therapy: WFL for tasks assessed/performed Overall Cognitive Status: Within Functional Limits for tasks assessed  General Comments: Pt A&O x 4 today. Able to follow commands and participate in exercises as tol.       Exercises Total Joint Exercises Ankle Circles/Pumps: AROM;Strengthening;Both;20 reps Quad Sets: Left;Strengthening;10 reps Short Arc Quad: Left;Strengthening;10  reps Heel Slides: AAROM;Left;15 reps Straight Leg Raises: AROM;Strengthening;10 reps;Left Goniometric ROM: L knee, 94 degrees flexion.     General Comments General comments (skin integrity, edema, etc.): Post-surgical swelling noted in L LE      Pertinent Vitals/Pain Pain Assessment: 0-10 Pain Score: 5  Pain Location: L knee Pain Descriptors / Indicators: Aching;Sore Pain Intervention(s): Limited activity within patient's tolerance;Monitored during session;Ice applied;Premedicated before session    Home Living                      Prior Function            PT Goals (current goals can now be found in the care plan section) Acute Rehab PT Goals Patient Stated Goal: go home and return to PLOF PT Goal Formulation: With patient Time For Goal Achievement: 01/25/20 Potential to Achieve Goals: Good Progress towards PT goals: Progressing toward goals    Frequency    BID      PT Plan Current plan remains appropriate    Co-evaluation              AM-PAC PT "6 Clicks" Mobility   Outcome Measure  Help needed turning from your back to your side while in a flat bed without using bedrails?: None Help needed moving from lying on your back to sitting on the side of a flat bed without using bedrails?: A Little Help needed moving to and from a bed to a chair (including a wheelchair)?: A Little Help needed standing up from a chair using your arms (e.g., wheelchair or bedside chair)?: None Help needed to walk in hospital room?: A Little Help needed climbing 3-5 steps with a railing? : A Little 6 Click Score: 20    End of Session Equipment Utilized During Treatment: Gait belt Activity Tolerance: Patient tolerated treatment well Patient left: in bed;with call bell/phone within reach;with bed alarm set Nurse Communication: Mobility status;Precautions;Weight bearing status PT Visit Diagnosis: Other abnormalities of gait and mobility (R26.89);Muscle weakness  (generalized) (M62.81);Pain Pain - Right/Left: Left Pain - part of body: Knee     Time: 0354-6568 PT Time Calculation (min) (ACUTE ONLY): 16 min  Charges:  $Gait Training: 8-22 mins                     Julaine Fusi PTA 01/13/20, 3:28 PM

## 2020-01-14 MED ORDER — TRAMADOL HCL 50 MG PO TABS: 50.0000 mg | ORAL_TABLET | Freq: Four times a day (QID) | ORAL | 0 refills | Status: DC | PRN

## 2020-01-14 MED ORDER — ENOXAPARIN SODIUM 40 MG/0.4ML ~~LOC~~ SOLN: 40.0000 mg | SUBCUTANEOUS | 0 refills | Status: DC

## 2020-01-14 MED ORDER — SENNOSIDES-DOCUSATE SODIUM 8.6-50 MG PO TABS: 1.0000 | ORAL_TABLET | Freq: Two times a day (BID) | ORAL | 0 refills | Status: DC

## 2020-01-14 MED ORDER — CELECOXIB 200 MG PO CAPS: 200.0000 mg | ORAL_CAPSULE | Freq: Two times a day (BID) | ORAL | 0 refills | Status: AC

## 2020-01-14 NOTE — Progress Notes (Signed)
Physical Therapy Treatment Patient Details Name: Lisa Good MRN: [id number] DOB: 03/20/1951 Today's Date: 01/14/2020    History of Present Illness Per chart review: Pt is a 69 y/o F with degenerative arthrosis of the left knee and is s/p elective L TKA. PMH includes: HTN, anxiety, depression, GERD, HLD, peripheral neuropathy, R TKA, and spinal fusion.    PT Comments    Pt reports increased pain today but agrees to gait.  Awaiting BM and discharge home.  Bed mobility wihtout assist.  She is able to stand and walk 120' with RW and heavy WB through arms on RW.  No LOB or buckling noted.  Seated AROM and stretching.  Pt returned to supine and awaiting enema with nursing.  Pt reports no further questions or concerns regarding therapy and discharge.   Follow Up Recommendations  Home health PT     Equipment Recommendations  None recommended by PT    Recommendations for Other Services       Precautions / Restrictions Restrictions Weight Bearing Restrictions: Yes LLE Weight Bearing: Weight bearing as tolerated    Mobility  Bed Mobility Overal bed mobility: Modified Independent                Transfers Overall transfer level: Needs assistance Equipment used: Rolling walker (2 wheeled) Transfers: Sit to/from Stand Sit to Stand: Supervision            Ambulation/Gait Ambulation/Gait assistance: Supervision Gait Distance (Feet): 140 Feet Assistive device: Rolling walker (2 wheeled) Gait Pattern/deviations: Step-through pattern;Decreased step length - right;Decreased step length - left;Decreased stance time - left Gait velocity: decreased    General Gait Details: heavy reliance on walker today due to pain but no LOB or buckling   Stairs         General stair comments: stated she was comfortable with training yesterday.   Wheelchair Mobility    Modified Rankin (Stroke Patients Only)       Balance Overall balance assessment: Needs  assistance Sitting-balance support: Feet supported;No upper extremity supported Sitting balance-Leahy Scale: Good     Standing balance support: Bilateral upper extremity supported;During functional activity Standing balance-Leahy Scale: Good                              Cognition Arousal/Alertness: Awake/alert Behavior During Therapy: WFL for tasks assessed/performed Overall Cognitive Status: Within Functional Limits for tasks assessed                                 General Comments: Pt A&O x 4 today. Able to follow commands and participate in exercises as tol.       Exercises Total Joint Exercises Short Arc Quad: Left;Strengthening;10 reps Heel Slides: AAROM;Left;15 reps Goniometric ROM: 4-95    General Comments        Pertinent Vitals/Pain Pain Assessment: 0-10 Pain Score: 8  Pain Location: L knee Pain Descriptors / Indicators: Aching;Sore Pain Intervention(s): Limited activity within patient's tolerance;Monitored during session;Ice applied    Home Living                      Prior Function            PT Goals (current goals can now be found in the care plan section) Progress towards PT goals: Progressing toward goals    Frequency  PT Plan Current plan remains appropriate    Co-evaluation              AM-PAC PT "6 Clicks" Mobility   Outcome Measure  Help needed turning from your back to your side while in a flat bed without using bedrails?: None Help needed moving from lying on your back to sitting on the side of a flat bed without using bedrails?: None Help needed moving to and from a bed to a chair (including a wheelchair)?: A Little Help needed standing up from a chair using your arms (e.g., wheelchair or bedside chair)?: A Little Help needed to walk in hospital room?: A Little Help needed climbing 3-5 steps with a railing? : A Little 6 Click Score: 20    End of Session Equipment Utilized  During Treatment: Gait belt Activity Tolerance: Patient tolerated treatment well Patient left: in bed;with call bell/phone within reach;with bed alarm set Nurse Communication: Mobility status Pain - Right/Left: Left Pain - part of body: Knee     Time: 7654-6503 PT Time Calculation (min) (ACUTE ONLY): 18 min  Charges:  $Gait Training: 8-22 mins                    Chesley Noon, PTA 01/14/20, 2:11 PM

## 2020-01-14 NOTE — Plan of Care (Signed)
  Problem: Education: Goal: Knowledge of General Education information will improve Description: Including pain rating scale, medication(s)/side effects and non-pharmacologic comfort measures Outcome: Progressing   Problem: Health Behavior/Discharge Planning: Goal: Ability to manage health-related needs will improve Outcome: Progressing   Problem: Clinical Measurements: Goal: Ability to maintain clinical measurements within normal limits will improve Outcome: Progressing Goal: Will remain free from infection Outcome: Progressing Goal: Diagnostic test results will improve Outcome: Progressing Goal: Respiratory complications will improve Outcome: Progressing Goal: Cardiovascular complication will be avoided Outcome: Progressing   Problem: Activity: Goal: Risk for activity intolerance will decrease Outcome: Progressing   Problem: Nutrition: Goal: Adequate nutrition will be maintained Outcome: Progressing   Problem: Coping: Goal: Level of anxiety will decrease Outcome: Progressing   Problem: Elimination: Goal: Will not experience complications related to urinary retention Outcome: Progressing

## 2020-01-14 NOTE — Progress Notes (Signed)
Pt not able to tolerate bone foam.

## 2020-01-14 NOTE — Discharge Summary (Signed)
Physician Discharge Summary  Patient ID: Lisa Good MRN: [id number] DOB/AGE: 1950-12-12 69 y.o.  Admit date: 01/12/2020 Discharge date: 01/14/2020  Admission Diagnoses:  Total knee replacement status [Z96.659]   Discharge Diagnoses: Patient Active Problem List   Diagnosis Date Noted  . 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 Chronic radicular lumbar pain 12/04/2019  . Iron deficiency anemia 11/01/2019  . Primary osteoarthritis of left knee 10/17/2019  . Primary osteoarthritis of right shoulder 10/17/2019  . Hx of cervical spine surgery 08/21/2019  . Pancolitis ([place]) 07/20/2019  . Kyphosis of cervical region 07/14/2019  . Cervical spondylosis with myelopathy 06/28/2019  . Chronic low back pain 06/28/2019  . Cervical spondylosis without myelopathy 11/09/2018  . Foraminal stenosis of cervical region 11/09/2018  . Cervical stenosis of spinal canal 11/09/2018  . History of fusion of lumbar spine 11/09/2018  . 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 Postlaminectomy syndrome of lumbosacral region 11/09/2018  . Chronic pain syndrome 11/09/2018  . Severe obesity (BMI 35.0-39.9) with comorbidity ([place]) 08/18/2018  . Primary osteoarthritis of right knee 02/17/2017  . Hyperlipidemia 12/02/2016  . Incontinence in female 10/05/2016  . Spondylolisthesis, lumbar region 05/05/2016  . 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  . OSA (obstructive sleep apnea) 05/31/2015  . Hypothyroidism 02/09/2014  . Sleep disorder 02/09/2014  . Asymptomatic hyperuricemia 08/29/2013  . Polyarthritis 08/29/2013  . Essential hypertension 06/28/2013  . SUI (stress urinary incontinence, female) 06/20/2013  . Insomnia 05/22/2013  . 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  . Peripheral neuropathy 05/25/2012  . Venous (peripheral) insufficiency 05/25/2012  . Depression 10/03/2009  . Restless legs syndrome (RLS) 10/03/2009  . Fibromyalgia 02/18/2009  . Gastro-esophageal reflux disease with  esophagitis 10/23/2008  . Generalized anxiety disorder 10/23/2008  . GERD (gastroesophageal reflux disease) 10/23/2008  . Pure hypercholesterolemia 10/23/2008    Past Medical History:  Diagnosis Date  . Allergy    seasonal  . Arthritis   . Depression   . GERD (gastroesophageal reflux disease)   . Hyperlipidemia   . Hypertension   . Iron deficiency anemia 11/01/2019  . Neuromuscular disorder ([place])    peripheral neuropathy/feet  . Sleep apnea    CPAP      Transfusion: None   Consultants (if any):   Discharged Condition: Improved  Hospital Course: Lisa Good is an 69 y.o. female who was admitted 01/12/2020 with a diagnosis of left knee osteoarthritis and went to the operating room on 01/12/2020 and underwent the above named procedures.    Surgeries: Procedure(s): COMPUTER ASSISTED TOTAL KNEE ARTHROPLASTY on 01/12/2020 Patient tolerated the surgery well. Taken to PACU where she was stabilized and then transferred to the orthopedic floor.  Started on Lovenox 30 mg q 12 hrs. Foot pumps applied bilaterally at 80 mm. Heels elevated on bed with rolled towels. No evidence of DVT. Negative [place]. Physical therapy started on day #1 for gait training and transfer. OT started day #1 for ADL and assisted devices.  Patient's foley was d/c on day #1. Patient's IV and hemovac was d/c on day #2.  Patient able to ambulate 200 feet on postop day 1 as well as complete stairs on postop day 1.  On post op day #2 patient was stable and ready for discharge to home with home health PT.  Implants: : DePuy Attune size 5 posterior stabilized femoral component (cemented), size 6 rotating platform tibial component (cemented), 38 mm medialized dome patella (cemented), and a 5 mm stabilized rotating platform polyethylene insert.  She was given perioperative  antibiotics:  Anti-infectives (From admission, onward)   Start     Dose/Rate Route Frequency Ordered Stop   01/12/20 1400  ceFAZolin (ANCEF) IVPB 2g/100  mL premix     2 g 200 mL/hr over 30 Minutes Intravenous Every 6 hours 01/12/20 1252 01/13/20 0855   01/12/20 0622  ceFAZolin (ANCEF) 2-4 GM/100ML-% IVPB    Note to Pharmacy: Milinda Cave   : cabinet override      01/12/20 0622 01/12/20 0748   01/12/20 0615  ceFAZolin (ANCEF) IVPB 2g/100 mL premix     2 g 200 mL/hr over 30 Minutes Intravenous On call to O.R. 01/12/20 7741 01/12/20 0731    .  She was given sequential compression devices, early ambulation, and Lovenox, TEDs for DVT prophylaxis.  She benefited maximally from the hospital stay and there were no complications.    Recent vital signs:  Vitals:   01/13/20 2352 01/14/20 0817  BP: (!) 144/68 136/69  Pulse: 90 84  Resp: 18 16  Temp: 98.4 F (36.9 C) (!) 97.5 F (36.4 C)  SpO2: 98% 98%    Recent laboratory studies:  Lab Results  Component Value Date   HGB 13.3 01/02/2020   HGB 12.4 10/31/2019   HGB 7.8 (L) 07/22/2019   Lab Results  Component Value Date   WBC 6.2 01/02/2020   PLT 271 01/02/2020   Lab Results  Component Value Date   INR 1.1 01/02/2020   Lab Results  Component Value Date   NA 140 01/02/2020   K 4.4 01/02/2020   CL 102 01/02/2020   CO2 27 01/02/2020   BUN 26 (H) 01/02/2020   CREATININE 1.02 (H) 01/02/2020   GLUCOSE 119 (H) 01/02/2020    Discharge Medications:   Allergies as of 01/14/2020      Reactions   Contrast Media [iodinated Diagnostic Agents] Hives   Cephalexin Nausea Only      Medication List    TAKE these medications   albuterol 108 (90 Base) MCG/ACT inhaler Commonly known as: VENTOLIN HFA Inhale 2 puffs into the lungs every 4 (four) hours as needed for wheezing or shortness of breath.   Alpha-Lipoic Acid 600 MG Caps Take 1,200 mg by mouth daily.   amLODipine 10 MG tablet Commonly known as: NORVASC Take 10 mg by mouth daily.   ARIPiprazole 5 MG tablet Commonly known as: ABILIFY Take 5 mg by mouth daily.   atorvastatin 40 MG tablet Commonly known as:  LIPITOR Take 40 mg by mouth daily.   celecoxib 200 MG capsule Commonly known as: CELEBREX Take 1 capsule (200 mg total) by mouth 2 (two) times daily for 10 days.   enoxaparin 40 MG/0.4ML injection Commonly known as: Lovenox Inject 0.4 mLs (40 mg total) into the skin daily for 14 days.   gabapentin 800 MG tablet Commonly known as: NEURONTIN Take 1 tablet (800 mg total) by mouth 4 (four) times daily.   lisinopril 40 MG tablet Commonly known as: ZESTRIL Take 40 mg by mouth daily.   oxyCODONE-acetaminophen 10-325 MG tablet Commonly known as: Percocet Take 1 tablet by mouth every 4 (four) hours as needed for pain. Must last 30 days. Start taking on: January 26, 2020 What changed: Another medication with the same name was removed. Continue taking this medication, and follow the directions you see here.   pantoprazole 40 MG tablet Commonly known as: PROTONIX Take 40 mg by mouth daily.   predniSONE 5 MG tablet Commonly known as: DELTASONE Take 5 mg by mouth daily  with breakfast.   senna-docusate 8.6-50 MG tablet Commonly known as: Senokot-S Take 1 tablet by mouth 2 (two) times daily.   traMADol 50 MG tablet Commonly known as: ULTRAM Take 1-2 tablets (50-100 mg total) by mouth every 6 (six) hours as needed for moderate pain.   venlafaxine XR 150 MG 24 hr capsule Commonly known as: EFFEXOR-XR Take 150 mg by mouth daily with breakfast.   vitamin B-12 1000 MCG tablet Commonly known as: CYANOCOBALAMIN Take 1 tablet (1,000 mcg total) by mouth daily.   Vitamin D3 25 MCG (1000 UT) Caps Take 1,000 Units by mouth daily.            Durable Medical Equipment  (From admission, onward)         Start     Ordered   01/12/20 1253  DME Walker rolling  Once    Question:  Patient needs a walker to treat with the following condition  Answer:  Total knee replacement status   01/12/20 1252   01/12/20 1253  DME Bedside commode  Once    Question:  Patient needs a bedside commode to  treat with the following condition  Answer:  Total knee replacement status   01/12/20 1252          Diagnostic Studies: DG Knee Left Port  Result Date: 01/12/2020 CLINICAL DATA:  Total knee replacement EXAM: PORTABLE LEFT KNEE - 1-2 VIEW COMPARISON:  None. FINDINGS: Total knee replacement in satisfactory position alignment. No fracture or complication. Soft tissue drains in the suprapatellar bursa. IMPRESSION: Satisfactory left knee replacement. Electronically Signed   By: Franchot Gallo M.D.   On: 01/12/2020 11:28    Disposition:     Follow-up Information    [facility] On 01/26/2020.   Specialty: Orthopedic Surgery Why: at 9:45am Contact information: [place] [address] [phone]        Dereck Leep, MD On 02/27/2020.   Specialty: Orthopedic Surgery Why: at 2:30pm Contact information: [place] [address] [phone]            Signed: [place] 01/14/2020, 10:18 AM

## 2020-01-14 NOTE — Progress Notes (Signed)
   Subjective: 2 Days Post-Op Procedure(s) (LRB): COMPUTER ASSISTED TOTAL KNEE ARTHROPLASTY (Left) Patient reports pain as 7 on 0-10 scale.   Patient is well, and has had no acute complaints or problems Denies any CP, SOB, ABD pain. We will continue therapy today.  Plan is to go Home after hospital stay.  Objective: Vital signs in last 24 hours: Temp:  [97.5 F (36.4 C)-98.4 F (36.9 C)] 97.5 F (36.4 C) (04/11 0817) Pulse Rate:  [84-95] 84 (04/11 0817) Resp:  [16-18] 16 (04/11 0817) BP: (126-144)/(68-69) 136/69 (04/11 0817) SpO2:  [95 %-98 %] 98 % (04/11 0817)  Intake/Output from previous day: 04/10 0701 - 04/11 0700 In: 560 [P.O.:360; IV Piggyback:200] Out: 130 [Drains:130] Intake/Output this shift: No intake/output data recorded.  No results for input(s): HGB in the last 72 hours. No results for input(s): WBC, RBC, HCT, PLT in the last 72 hours. No results for input(s): NA, K, CL, CO2, BUN, CREATININE, GLUCOSE, CALCIUM in the last 72 hours. No results for input(s): LABPT, INR in the last 72 hours.  EXAM General - Patient is Alert, Appropriate and Oriented Extremity - Neurovascular intact Sensation intact distally Intact pulses distally Dorsiflexion/Plantar flexion intact No cellulitis present Compartment soft  Dressing removed, Hemovac removed.  Moderate drainage to honeycomb, new honeycomb was placed.  Bone foam reapplied Dressing - dressing C/D/I and no drainage Motor Function - intact, moving foot and toes well on exam.   Past Medical History:  Diagnosis Date  . Allergy    seasonal  . Arthritis   . Depression   . GERD (gastroesophageal reflux disease)   . Hyperlipidemia   . Hypertension   . Iron deficiency anemia 11/01/2019  . Neuromuscular disorder ([place])    peripheral neuropathy/feet  . Sleep apnea    CPAP     Assessment/Plan:   2 Days Post-Op Procedure(s) (LRB): COMPUTER ASSISTED TOTAL KNEE ARTHROPLASTY (Left) Active Problems:   Total knee  replacement status  Estimated body mass index is 32.61 kg/m as calculated from the following:   Height as of this encounter: 5' 3"  (1.6 m).   Weight as of this encounter: 83.5 kg. Advance diet Up with therapy  Needs bowel movement Vital signs are stable Pain controlled. Discharge home with home health PT today pending bowel movement  DVT Prophylaxis - Lovenox, Foot Pumps and TED hose Weight-Bearing as tolerated to left leg   T. Rachelle Hora, PA-C [place] 01/14/2020, 10:12 AM

## 2020-01-16 NOTE — Anesthesia Postprocedure Evaluation (Signed)
Anesthesia Post Note  Patient: Lisa Good  Procedure(s) Performed: COMPUTER ASSISTED TOTAL KNEE ARTHROPLASTY (Left Knee)  Patient location during evaluation: PACU Anesthesia Type: Spinal Level of consciousness: awake and alert Pain management: pain level controlled Vital Signs Assessment: post-procedure vital signs reviewed and stable Respiratory status: spontaneous breathing, nonlabored ventilation, respiratory function stable and patient connected to nasal cannula oxygen Cardiovascular status: blood pressure returned to baseline and stable Postop Assessment: no apparent nausea or vomiting Anesthetic complications: no     Last Vitals:  Vitals:   01/13/20 2352 01/14/20 0817  BP: (!) 144/68 136/69  Pulse: 90 84  Resp: 18 16  Temp: 36.9 C (!) 36.4 C  SpO2: 98% 98%    Last Pain:  Vitals:   01/14/20 1014  TempSrc:   PainSc: [place]

## 2020-02-06 ENCOUNTER — Other Ambulatory Visit: Payer: Self-pay

## 2020-02-06 ENCOUNTER — Ambulatory Visit
Payer: Medicare Other | Attending: Student in an Organized Health Care Education/Training Program | Admitting: Student in an Organized Health Care Education/Training Program

## 2020-02-06 ENCOUNTER — Encounter: Payer: Self-pay | Admitting: Student in an Organized Health Care Education/Training Program

## 2020-02-06 VITALS — BP 148/65 | HR 89 | Temp 97.3°F | Resp 14 | Ht 63.0 in | Wt 184.0 lb

## 2020-02-06 DIAGNOSIS — M19011 Primary osteoarthritis, right shoulder: Secondary | ICD-10-CM

## 2020-02-06 DIAGNOSIS — Z9889 Other specified postprocedural states: Secondary | ICD-10-CM | POA: Insufficient documentation

## 2020-02-06 DIAGNOSIS — M961 Postlaminectomy syndrome, not elsewhere classified: Secondary | ICD-10-CM | POA: Diagnosis present

## 2020-02-06 DIAGNOSIS — G894 Chronic pain syndrome: Secondary | ICD-10-CM

## 2020-02-06 DIAGNOSIS — M1712 Unilateral primary osteoarthritis, left knee: Secondary | ICD-10-CM | POA: Insufficient documentation

## 2020-02-06 DIAGNOSIS — M4802 Spinal stenosis, cervical region: Secondary | ICD-10-CM

## 2020-02-06 MED ORDER — ORPHENADRINE CITRATE 30 MG/ML IJ SOLN
30.0000 mg | Freq: Once | INTRAMUSCULAR | Status: AC
  Administered 2020-02-06: 60 mg via INTRAMUSCULAR

## 2020-02-06 MED ORDER — XTAMPZA ER 9 MG PO C12A: 1.0000 | EXTENDED_RELEASE_CAPSULE | Freq: Two times a day (BID) | ORAL | 0 refills | Status: AC

## 2020-02-06 MED ORDER — KETOROLAC TROMETHAMINE 30 MG/ML IJ SOLN
30.0000 mg | Freq: Once | INTRAMUSCULAR | Status: AC
  Administered 2020-02-06: 30 mg via INTRAMUSCULAR

## 2020-02-06 MED ORDER — OXYCODONE-ACETAMINOPHEN 10-325 MG PO TABS: 1.0000 | ORAL_TABLET | ORAL | 0 refills | Status: AC | PRN

## 2020-02-06 MED ORDER — ORPHENADRINE CITRATE 30 MG/ML IJ SOLN
INTRAMUSCULAR | Status: AC
  Filled 2020-02-06: qty 2

## 2020-02-06 MED ORDER — KETOROLAC TROMETHAMINE 30 MG/ML IJ SOLN
INTRAMUSCULAR | Status: AC
  Filled 2020-02-06: qty 1

## 2020-02-06 NOTE — Progress Notes (Signed)
Nursing Pain Medication Assessment:  Safety precautions to be maintained throughout the outpatient stay will include: orient to surroundings, keep bed in low position, maintain call bell within reach at all times, provide assistance with transfer out of bed and ambulation.  Medication Inspection Compliance: Lisa Good did not comply with our request to bring her pills to be counted. She was reminded that bringing the medication bottles, even when empty, is a requirement.  Medication: None brought in. Pill/Patch Count: None available to be counted. Bottle Appearance: No container available. Did not bring bottle(s) to appointment. Filled Date: N/A Last Medication intake:  Today

## 2020-02-06 NOTE — Progress Notes (Signed)
PROVIDER NOTE: Information contained herein reflects review and annotations entered in association with encounter. Interpretation of such information and data should be left to medically-trained personnel. Information provided to patient can be located elsewhere in the medical record under "Patient Instructions". Document created using STT-dictation technology, any transcriptional errors that may result from process are unintentional.    Patient: Lisa Good  Service Category: E/M  Provider: Gillis Santa, MD  DOB: 28-Dec-1950  DOS: 02/06/2020  Referring Provider: [name], MD  MRN: [id number]  Setting: Ambulatory outpatient  PCP: [name], MD  Type: Established Patient  Specialty: Interventional Pain Management    Location: Office  Delivery: Face-to-face     Primary Reason(s) for Visit: Encounter for prescription drug management. (Level of risk: moderate)  CC: Back Pain (lower), Knee Pain (left), and Foot Pain (left)  HPI  Lisa Good is a 69 y.o. year old, female patient, who comes today for a medication management evaluation. She has Cervical spondylosis without myelopathy; Foraminal stenosis of cervical region; Cervical stenosis of spinal canal; History of fusion of lumbar spine; Spinal stenosis of lumbar region with neurogenic claudication; Postlaminectomy syndrome of lumbosacral region; Chronic pain syndrome; Pancolitis ([place]); Hx of cervical spine surgery; Primary osteoarthritis of left knee; Primary osteoarthritis of right shoulder; Iron deficiency anemia; Chronic radicular lumbar pain; Asymptomatic hyperuricemia; Cervical spondylosis with myelopathy; Chronic low back pain; Depression; Essential hypertension; Gastro-esophageal reflux disease with esophagitis; Generalized anxiety disorder; GERD (gastroesophageal reflux disease); Hyperlipidemia; Hypothyroidism; Incontinence in female; Insomnia; Kyphosis of cervical region; OSA (obstructive sleep apnea); Peripheral neuropathy;  Polyarthritis; Pure hypercholesterolemia; Restless legs syndrome (RLS); S/P cerebral aneurysm repair; Schwannoma; Severe obesity (BMI 35.0-39.9) with comorbidity ([place]); Sleep disorder; SUI (stress urinary incontinence, female); Venous (peripheral) insufficiency; Fibromyalgia; Spondylolisthesis, lumbar region; Primary osteoarthritis of right knee; and Total knee replacement status on their problem list. Her primarily concern today is the Back Pain (lower), Knee Pain (left), and Foot Pain (left)  Pain Assessment: Location: Lower Back Radiating: denies Onset: More than a month ago Duration: Chronic pain Quality: Burning, Shooting, Stabbing Severity: 7 /10 (subjective, self-reported pain score)  Note: Reported level is compatible with observation.                         When using our objective Pain Scale, levels between 6 and 10/10 are said to belong in an emergency room, as it progressively worsens from a 6/10, described as severely limiting, requiring emergency care not usually available at an outpatient pain management facility. At a 6/10 level, communication becomes difficult and requires great effort. Assistance to reach the emergency department may be required. Facial flushing and profuse sweating along with potentially dangerous increases in heart rate and blood pressure will be evident. Effect on ADL:   Timing: Constant Modifying factors: rest BP: (!) 148/65  HR: 89  Lisa Good was last scheduled for an appointment on 12/19/2019 for medication management. During today's appointment we reviewed Ms. Manner's chronic pain status, as well as her outpatient medication regimen.  Patient follows up today status post left total knee arthroplasty on 01/12/2020.  She states that she is having a difficult time managing her postoperative left knee pain.  She has pain with weightbearing.  Given her increased pain, she is utilizing more of her chronic pain medication that I have prescribed to her.  She is  also endorsing low back pain that is radiating into bilateral legs.  The patient  reports no history of drug use. Her  body mass index is 32.59 kg/m.  Further details on both, my assessment(s), as well as the proposed treatment plan, please see below.  Controlled Substance Pharmacotherapy Assessment REMS (Risk Evaluation and Mitigation Strategy)  Analgesic: 01/27/2020  4   12/19/2019  Oxycodone-Acetaminophen 10-325  150.00  30 Bi Lat   [id number]   Nor (2541)   0  75.00 MME  Medicare   [place]     Landis Martins, RN  02/06/2020 11:44 AM  Sign when Signing Visit Nursing Pain Medication Assessment:  Safety precautions to be maintained throughout the outpatient stay will include: orient to surroundings, keep bed in low position, maintain call bell within reach at all times, provide assistance with transfer out of bed and ambulation.  Medication Inspection Compliance: Lisa Good did not comply with our request to bring her pills to be counted. She was reminded that bringing the medication bottles, even when empty, is a requirement.  Medication: None brought in. Pill/Patch Count: None available to be counted. Bottle Appearance: No container available. Did not bring bottle(s) to appointment. Filled Date: N/A Last Medication intake:  Today   Pharmacokinetics: Liberation and absorption (onset of action): WNL Distribution (time to peak effect): WNL Metabolism and excretion (duration of action): WNL         Pharmacodynamics: Desired effects: Analgesia: Lisa Good reports 50% benefit. Functional ability: Patient reports that medication allows her to accomplish basic ADLs Clinically meaningful improvement in function (CMIF): Sustained CMIF goals met Perceived effectiveness: Described as relatively effective but with some room for improvement Undesirable effects: Side-effects or Adverse reactions: None reported Monitoring: [place] PMP: PDMP not reviewed this encounter. Online review of the past 35-month period conducted. Compliant with practice rules and regulations Last UDS on record: No results found for: SUMMARY UDS interpretation: Compliant          Medication Assessment Form: Reviewed. Patient indicates being compliant with therapy Treatment compliance: Compliant Risk Assessment Profile: Aberrant behavior: See initial evaluations. None observed or detected today Comorbid factors increasing risk of overdose: See initial evaluation. No additional risks detected today Opioid risk tool (ORT):  Opioid Risk  12/04/2019  Alcohol 0  Illegal Drugs 0  Rx Drugs 0  Alcohol 0  Illegal Drugs 0  Rx Drugs 0  Age between 16-45 years  -  History of Preadolescent Sexual Abuse -  Psychological Disease 2  Depression 1  Opioid Risk Tool Scoring 3  Opioid Risk Interpretation Low Risk    ORT Scoring interpretation table:  Score <3 = Low Risk for SUD  Score between 4-7 = Moderate Risk for SUD  Score >8 = High Risk for Opioid Abuse   Risk of substance use disorder (SUD): Low  Risk Mitigation Strategies:  Patient Counseling: Covered Patient-Prescriber Agreement (PPA): Present and active  Notification to other healthcare providers: Done  Pharmacologic Plan: Continue oxycodone as prescribed, will add Xtampza ER for long-acting analgesic             Laboratory Chemistry Profile   Renal Lab Results  Component Value Date   BUN 26 (H) 01/02/2020   CREATININE 1.02 (H) 01/02/2020   GFRAA >60 01/02/2020   GFRNONAA 56 (L) 01/02/2020   SPECGRAV 1.020 04/20/2019   PHUR 5.0 04/20/2019   PROTEINUR NEGATIVE 01/02/2020     Electrolytes Lab Results  Component Value Date   NA 140 01/02/2020   K 4.4 01/02/2020   CL 102 01/02/2020   CALCIUM 9.3 01/02/2020   MG 2.1 07/21/2019   PHOS 3.4  07/21/2019     Hepatic Lab Results  Component Value Date   AST 19 01/02/2020   ALT 13 01/02/2020   ALBUMIN 3.8 01/02/2020   ALKPHOS 95 01/02/2020     ID Lab Results  Component Value Date   HIV NON  REACTIVE 07/20/2019   [place] NEGATIVE 01/10/2020   STAPHAUREUS NEGATIVE 01/02/2020   MRSAPCR NEGATIVE 01/02/2020     Bone No results found for: VD25OH, YH062BJ6EGB, TD1761YW7, PX1062IR4, 25OHVITD1, 25OHVITD2, 25OHVITD3, TESTOFREE, TESTOSTERONE   Endocrine Lab Results  Component Value Date   GLUCOSE 119 (H) 01/02/2020   GLUCOSEU NEGATIVE 01/02/2020     Neuropathy Lab Results  Component Value Date   VITAMINB12 256 10/31/2019   FOLATE 30.0 07/22/2019   HIV NON REACTIVE 07/20/2019     CNS No results found for: COLORCSF, APPEARCSF, RBCCOUNTCSF, WBCCSF, POLYSCSF, LYMPHSCSF, EOSCSF, PROTEINCSF, GLUCCSF, JCVIRUS, CSFOLI, IGGCSF, LABACHR, ACETBL, LABACHR, ACETBL   Inflammation (CRP: Acute  ESR: Chronic) Lab Results  Component Value Date   CRP 0.7 01/02/2020   ESRSEDRATE 43 (H) 01/02/2020   LATICACIDVEN 1.4 07/20/2019     Rheumatology No results found for: RF, ANA, LABURIC, URICUR, LYMEIGGIGMAB, LYMEABIGMQN, HLAB27   Coagulation Lab Results  Component Value Date   INR 1.1 01/02/2020   LABPROT 13.7 01/02/2020   APTT 30 01/02/2020   PLT 271 01/02/2020     Cardiovascular Lab Results  Component Value Date   HGB 13.3 01/02/2020   HCT 41.3 01/02/2020     Screening Lab Results  Component Value Date   SARSCOV2NAA NEGATIVE 01/10/2020   COVIDSOURCE NASOPHARYNGEAL 03/08/2019   STAPHAUREUS NEGATIVE 01/02/2020   MRSAPCR NEGATIVE 01/02/2020   HIV NON REACTIVE 07/20/2019     Cancer No results found for: CEA, CA125, LABCA2   Allergens No results found for: ALMOND, APPLE, ASPARAGUS, AVOCADO, BANANA, BARLEY, BASIL, BAYLEAF, GREENBEAN, LIMABEAN, WHITEBEAN, BEEFIGE, REDBEET, BLUEBERRY, BROCCOLI, CABBAGE, MELON, CARROT, CASEIN, CASHEWNUT, CAULIFLOWER, CELERY     Note: Lab results reviewed.   Recent Diagnostic Imaging Results  DG Knee Left Port CLINICAL DATA:  Total knee replacement  EXAM: PORTABLE LEFT KNEE - 1-2 VIEW  COMPARISON:  None.  FINDINGS: Total knee  replacement in satisfactory position alignment. No fracture or complication. Soft tissue drains in the suprapatellar bursa.  IMPRESSION: Satisfactory left knee replacement.  Electronically Signed   By: Franchot Gallo M.D.   On: 01/12/2020 11:28  Complexity Note: Imaging results reviewed. Results shared with Ms. Sturgess, using Layman's terms.                               Meds   Current Outpatient Medications:  .  Alpha-Lipoic Acid 600 MG CAPS, Take 1,200 mg by mouth daily. , Disp: , Rfl:  .  amLODipine (NORVASC) 10 MG tablet, Take 10 mg by mouth daily., Disp: , Rfl:  .  ARIPiprazole (ABILIFY) 5 MG tablet, Take 5 mg by mouth daily. , Disp: , Rfl:  .  atorvastatin (LIPITOR) 40 MG tablet, Take 40 mg by mouth daily., Disp: , Rfl:  .  Cholecalciferol (VITAMIN D3) 25 MCG (1000 UT) CAPS, Take 1,000 Units by mouth daily. , Disp: , Rfl:  .  gabapentin (NEURONTIN) 800 MG tablet, Take 1 tablet (800 mg total) by mouth 4 (four) times daily., Disp: 120 tablet, Rfl: 3 .  lisinopril (PRINIVIL,ZESTRIL) 40 MG tablet, Take 40 mg by mouth daily., Disp: , Rfl:  .  [START ON 02/17/2020] oxyCODONE-acetaminophen (PERCOCET) 10-325 MG  tablet, Take 1 tablet by mouth every 4 (four) hours as needed for pain. Must last 30 days., Disp: 150 tablet, Rfl: 0 .  [START ON 03/18/2020] oxyCODONE-acetaminophen (PERCOCET) 10-325 MG tablet, Take 1 tablet by mouth every 4 (four) hours as needed for pain. Must last 30 days., Disp: 150 tablet, Rfl: 0 .  pantoprazole (PROTONIX) 40 MG tablet, Take 40 mg by mouth daily., Disp: , Rfl:  .  senna-docusate (SENOKOT-S) 8.6-50 MG tablet, Take 1 tablet by mouth 2 (two) times daily., Disp: 15 tablet, Rfl: 0 .  venlafaxine XR (EFFEXOR-XR) 150 MG 24 hr capsule, Take 150 mg by mouth daily with breakfast. , Disp: , Rfl:  .  vitamin B-12 (CYANOCOBALAMIN) 1000 MCG tablet, Take 1 tablet (1,000 mcg total) by mouth daily., Disp: 90 tablet, Rfl: 1 .  albuterol (VENTOLIN HFA) 108 (90 Base) MCG/ACT  inhaler, Inhale 2 puffs into the lungs every 4 (four) hours as needed for wheezing or shortness of breath. , Disp: , Rfl:  .  enoxaparin (LOVENOX) 40 MG/0.4ML injection, Inject 0.4 mLs (40 mg total) into the skin daily for 14 days., Disp: 5.6 mL, Rfl: 0 .  oxyCODONE ER (XTAMPZA ER) 9 MG C12A, Take 1 capsule by mouth 2 (two) times daily., Disp: 60 capsule, Rfl: 0 .  [START ON 03/07/2020] oxyCODONE ER (XTAMPZA ER) 9 MG C12A, Take 1 capsule by mouth 2 (two) times daily., Disp: 60 capsule, Rfl: 0 .  predniSONE (DELTASONE) 5 MG tablet, Take 5 mg by mouth daily with breakfast., Disp: , Rfl:   ROS  Constitutional: Denies any fever or chills Gastrointestinal: No reported hemesis, hematochezia, vomiting, or acute GI distress Musculoskeletal: Denies any acute onset joint swelling, redness, loss of ROM, or weakness Neurological: No reported episodes of acute onset apraxia, aphasia, dysarthria, agnosia, amnesia, paralysis, loss of coordination, or loss of consciousness  Allergies  Ms. Corado is allergic to contrast media [iodinated diagnostic agents] and cephalexin.  [place]  Drug: Ms. Roll  reports no history of drug use. Alcohol:  reports current alcohol use. Tobacco:  reports that she quit smoking about 15 years ago. She has never used smokeless tobacco. Medical:  has a past medical history of Allergy, Arthritis, Depression, GERD (gastroesophageal reflux disease), Hyperlipidemia, Hypertension, Iron deficiency anemia (11/01/2019), Neuromuscular disorder ([place]), and Sleep apnea. Surgical: Ms. Vasco  has a past surgical history that includes Spine surgery; Joint replacement (Right); Tubal ligation; Brain surgery (2002); Extracorporeal shock wave lithotripsy (Right, 04/27/2019); Spinal fusion (07/10/2019); Colonoscopy with propofol (N/A, 11/10/2019); Esophagogastroduodenoscopy (egd) with propofol (N/A, 11/10/2019); polypectomy (11/10/2019); and Knee Arthroplasty (Left, 01/12/2020). Family: family history includes  Depression in her brother and sister; Hyperlipidemia in her sister; Hypertension in her mother and sister.  Constitutional Exam  General appearance: Well nourished, well developed, and well hydrated. In no apparent acute distress Vitals:   02/06/20 1137  BP: (!) 148/65  Pulse: 89  Resp: 14  Temp: (!) 97.3 F (36.3 C)  TempSrc: Temporal  SpO2: 100%  Weight: 184 lb (83.5 kg)  Height: 5' 3"  (1.6 m)   BMI Assessment: Estimated body mass index is 32.59 kg/m as calculated from the following:   Height as of this encounter: 5' 3"  (1.6 m).   Weight as of this encounter: 184 lb (83.5 kg).  BMI interpretation table: BMI level Category Range association with higher incidence of chronic pain  <18 kg/m2 Underweight   18.5-24.9 kg/m2 Ideal body weight   25-29.9 kg/m2 Overweight Increased incidence by 20%  30-34.9 kg/m2 Obese (Class  I) Increased incidence by 68%  35-39.9 kg/m2 Severe obesity (Class II) Increased incidence by 136%  >40 kg/m2 Extreme obesity (Class III) Increased incidence by 254%   Patient's current BMI Ideal Body weight  Body mass index is 32.59 kg/m. Ideal body weight: 52.4 kg (115 lb 8.3 oz) Adjusted ideal body weight: 64.8 kg (142 lb 14.6 oz)   BMI Readings from Last 4 Encounters:  02/06/20 32.59 kg/m  01/12/20 32.61 kg/m  01/02/20 32.59 kg/m  12/04/19 34.01 kg/m   Wt Readings from Last 4 Encounters:  02/06/20 184 lb (83.5 kg)  01/12/20 184 lb 1.4 oz (83.5 kg)  01/02/20 184 lb (83.5 kg)  12/04/19 192 lb (87.1 kg)    Psych/Mental status: Alert, oriented x 3 (person, place, & time)       Eyes: PERLA Respiratory: No evidence of acute respiratory distress  Cervical Spine Exam  Skin & Axial Inspection: Well healed scar from previous spine surgery detected Alignment: Symmetrical Functional ROM: Pain restricted ROM      Stability: No instability detected Muscle Tone/Strength: Functionally intact. No obvious neuro-muscular anomalies detected. Sensory  (Neurological): Musculoskeletal pain pattern Palpation: No palpable anomalies              Upper Extremity (UE) Exam    Side: Right upper extremity  Side: Left upper extremity  Skin & Extremity Inspection: Skin color, temperature, and hair growth are WNL. No peripheral edema or cyanosis. No masses, redness, swelling, asymmetry, or associated skin lesions. No contractures.  Skin & Extremity Inspection: Skin color, temperature, and hair growth are WNL. No peripheral edema or cyanosis. No masses, redness, swelling, asymmetry, or associated skin lesions. No contractures.  Functional ROM: Unrestricted ROM          Functional ROM: Unrestricted ROM          Muscle Tone/Strength: Functionally intact. No obvious neuro-muscular anomalies detected.  Muscle Tone/Strength: Functionally intact. No obvious neuro-muscular anomalies detected.  Sensory (Neurological): Unimpaired          Sensory (Neurological): Unimpaired          Palpation: No palpable anomalies              Palpation: No palpable anomalies              Provocative Test(s):  Phalen's test: deferred Tinel's test: deferred Apley's scratch test (touch opposite shoulder):  Action 1 (Across chest): deferred Action 2 (Overhead): deferred Action 3 (LB reach): deferred   Provocative Test(s):  Phalen's test: deferred Tinel's test: deferred Apley's scratch test (touch opposite shoulder):  Action 1 (Across chest): deferred Action 2 (Overhead): deferred Action 3 (LB reach): deferred    Thoracic Spine Area Exam  Skin & Axial Inspection: No masses, redness, or swelling Alignment: Symmetrical Functional ROM: Unrestricted ROM Stability: No instability detected Muscle Tone/Strength: Functionally intact. No obvious neuro-muscular anomalies detected. Sensory (Neurological): Unimpaired Muscle strength & Tone: No palpable anomalies  Lumbar Exam  Skin & Axial Inspection: No masses, redness, or swelling Alignment: Symmetrical Functional ROM: Pain  restricted ROM       Stability: No instability detected Muscle Tone/Strength: Functionally intact. No obvious neuro-muscular anomalies detected. Sensory (Neurological): Dermatomal pain pattern Palpation: No palpable anomalies       Provocative Tests: Hyperextension/rotation test: (+) bilaterally for facet joint pain. Lumbar quadrant test (Kemp's test): (+) due to pain. Lateral bending test: (+) due to pain. Patrick's Maneuver: deferred today  FABER* test: deferred today                   S-I anterior distraction/compression test: deferred today         S-I lateral compression test: deferred today         S-I Thigh-thrust test: deferred today         S-I Gaenslen's test: deferred today         *(Flexion, ABduction and External Rotation)  Gait & Posture Assessment  Ambulation: Limited Gait: Relatively normal for age and body habitus Posture: WNL   Lower Extremity Exam    Side: Right lower extremity  Side: Left lower extremity  Stability: No instability observed          Stability: No instability observed          Skin & Extremity Inspection: Skin color, temperature, and hair growth are WNL. No peripheral edema or cyanosis. No masses, redness, swelling, asymmetry, or associated skin lesions. No contractures.  Skin & Extremity Inspection: Evidence of prior arthroplastic surgery surgical Steri-Strips in place  Functional ROM: Unrestricted ROM                  Functional ROM: Pain restricted ROM for hip and knee joints          Muscle Tone/Strength: Functionally intact. No obvious neuro-muscular anomalies detected.  Muscle Tone/Strength: Functionally intact. No obvious neuro-muscular anomalies detected.  Sensory (Neurological): Unimpaired        Sensory (Neurological): Musculoskeletal pain pattern        DTR: Patellar: deferred today Achilles: deferred today Plantar: deferred today  DTR: Patellar: deferred today Achilles: deferred today Plantar: deferred today   Palpation: No palpable anomalies  Palpation: No palpable anomalies   Assessment   Status Diagnosis  Controlled Having a Flare-up Controlled 1. Primary osteoarthritis of right shoulder   2. Primary osteoarthritis of left knee   3. Chronic pain syndrome   4. Hx of cervical spine surgery   5. Foraminal stenosis of cervical region   6. Cervical stenosis of spinal canal   7. Postlaminectomy syndrome of lumbosacral region      1.  Given acute on chronic left knee pain secondary to her recent total left knee arthroplasty, recommend adding a long-acting opioid analgesics such as Xtampza ER as below.  This will help reduce her baseline pain hopefully she can utilize her oxycodone for breakthrough.  We will likely wean/discontinue at her next follow-up as this is meant to be for short-term only given her acute on chronic pain. 2.  Refill oxycodone as below, 10 mg every 4 hours as needed, quantity 150/month.  We will likely wean and transition to buprenorphine as patient was on before, Belbuca 600 mcg film twice daily after her acute postoperative pain improves. 3.  Continue gabapentin, alpha lipoic acid, Effexor as prescribed. 4.  IM Norflex and Toradol for pain flare  Plan of Care  Pharmacotherapy (Medications Ordered): Meds ordered this encounter  Medications  . oxyCODONE-acetaminophen (PERCOCET) 10-325 MG tablet    Sig: Take 1 tablet by mouth every 4 (four) hours as needed for pain. Must last 30 days.    Dispense:  150 tablet    Refill:  0    Chronic Pain. (STOP Act - Not applicable). Fill one day early if closed on scheduled refill date. Maximum 5 tablets/day  . oxyCODONE-acetaminophen (PERCOCET) 10-325 MG tablet    Sig: Take 1 tablet by mouth every 4 (four) hours as needed for  pain. Must last 30 days.    Dispense:  150 tablet    Refill:  0    Chronic Pain. (STOP Act - Not applicable). Fill one day early if closed on scheduled refill date. Maximum 5 tablets/day  . oxyCODONE ER (XTAMPZA  ER) 9 MG C12A    Sig: Take 1 capsule by mouth 2 (two) times daily.    Dispense:  60 capsule    Refill:  0  . oxyCODONE ER (XTAMPZA ER) 9 MG C12A    Sig: Take 1 capsule by mouth 2 (two) times daily.    Dispense:  60 capsule    Refill:  0  . orphenadrine (NORFLEX) injection 30 mg  . ketorolac (TORADOL) 30 MG/ML injection 30 mg   Medications administered today: We administered orphenadrine and ketorolac. Planned follow-up:   Return in about 8 weeks (around 04/02/2020) for Medication Management, in person.   Recent Visits Date Type Provider Dept  12/19/19 Office Visit Gillis Santa, MD [facility]  12/04/19 Procedure visit Gillis Santa, MD [facility]  11/21/19 Office Visit Gillis Santa, MD [facility]  Showing recent visits within past 90 days and meeting all other requirements   Today's Visits Date Type Provider Dept  02/06/20 Office Visit Gillis Santa, MD [facility]  Showing today's visits and meeting all other requirements   Future Appointments Date Type Provider Dept  04/02/20 Appointment Gillis Santa, MD [facility]  Showing future appointments within next 90 days and meeting all other requirements   Primary Care Physician: [name], MD Location: [facility] Note by: Gillis Santa, MD Date: 02/06/2020; Time: 1:03 PM  Note: This dictation was prepared with Dragon dictation. Any transcriptional errors that may result from this process are unintentional.

## 2020-02-06 NOTE — Patient Instructions (Signed)
You were given 2 prescriptions each for Percocet and Xtampza.

## 2020-02-19 ENCOUNTER — Telehealth: Payer: Self-pay | Admitting: *Deleted

## 2020-02-19 NOTE — Telephone Encounter (Signed)
Why can't she fill the one dated for 02-07-20?

## 2020-02-19 NOTE — Telephone Encounter (Signed)
The one you are referring to is her every 30 day prescription, right?  She thought you were going to write an additional prescripotion.

## 2020-02-19 NOTE — Telephone Encounter (Signed)
I called [facility], her script for Oxy/Acet dated 02-07-20 is there, but cannot be filled yet because she filled the last one on 01-27-20. I called patient to tell her this, she said you agreed to cover her post op pain with an additional prescription.

## 2020-02-22 ENCOUNTER — Telehealth: Payer: Self-pay | Admitting: *Deleted

## 2020-02-22 ENCOUNTER — Other Ambulatory Visit: Payer: Self-pay | Admitting: Student in an Organized Health Care Education/Training Program

## 2020-02-22 ENCOUNTER — Telehealth: Payer: Self-pay | Admitting: Oncology

## 2020-02-22 NOTE — Telephone Encounter (Signed)
Patient left voicemail on this date stating that she would have to cancel her appts on 02-23-20 and on 02-27-20. Patient stated in the voicemail that she would phone back at a later time to reschedule.

## 2020-02-22 NOTE — Telephone Encounter (Signed)
I called patient to inform her that Dr. Holley Raring will not be prescribing any additional pain meds. She became very angry. I explained to her that pharmacy may not fill it because it has not been 30 days since the last script. She stated "tell Dr. Holley Raring he is responsible for whatever happens to me this weekend, I'm already depressed." She repeated this to me several times. [place] [employer] called, Dr. Holley Raring notified.

## 2020-02-22 NOTE — Telephone Encounter (Signed)
[employer] [facility] contacted, they will send an officer to her home.

## 2020-02-22 NOTE — Telephone Encounter (Signed)
Please advise 

## 2020-02-22 NOTE — Telephone Encounter (Signed)
Please call this patient so she can explain exactly what happened. She had surgery and DR. Lateef said to use her regular meds for breakthru pain and he would write a new script to cover her post op pain, which was supposed to take care of regular until time to fill on May 5. The one for may 5 is post op meds.   So she filled one on 4-24 that was her regular meds, then script was supposed to be sent in for post op, which is May 5th? The pharmacy will not let her fill this script so she has been taking her regular meds. She will be out of meds completely this weekend. Her regular script cannot be filled until 5-24? Not sure on that. She has not been able to stay ahead of the pain because she has not had much post op relief.   Please help

## 2020-02-23 ENCOUNTER — Inpatient Hospital Stay: Payer: Medicare Other

## 2020-02-26 ENCOUNTER — Inpatient Hospital Stay: Payer: Medicare Other | Admitting: Oncology

## 2020-02-27 ENCOUNTER — Inpatient Hospital Stay: Payer: Medicare Other | Admitting: Oncology

## 2020-03-08 ENCOUNTER — Encounter: Payer: Self-pay | Admitting: Student in an Organized Health Care Education/Training Program

## 2020-03-12 ENCOUNTER — Ambulatory Visit
Payer: Medicare Other | Attending: Student in an Organized Health Care Education/Training Program | Admitting: Student in an Organized Health Care Education/Training Program

## 2020-03-12 ENCOUNTER — Telehealth: Payer: Self-pay | Admitting: *Deleted

## 2020-03-12 ENCOUNTER — Other Ambulatory Visit: Payer: Self-pay

## 2020-03-12 ENCOUNTER — Encounter: Payer: Self-pay | Admitting: Student in an Organized Health Care Education/Training Program

## 2020-03-12 DIAGNOSIS — G8929 Other chronic pain: Secondary | ICD-10-CM

## 2020-03-12 DIAGNOSIS — M1712 Unilateral primary osteoarthritis, left knee: Secondary | ICD-10-CM | POA: Diagnosis not present

## 2020-03-12 DIAGNOSIS — G894 Chronic pain syndrome: Secondary | ICD-10-CM

## 2020-03-12 DIAGNOSIS — Z9889 Other specified postprocedural states: Secondary | ICD-10-CM | POA: Diagnosis not present

## 2020-03-12 DIAGNOSIS — Z981 Arthrodesis status: Secondary | ICD-10-CM

## 2020-03-12 DIAGNOSIS — M19011 Primary osteoarthritis, right shoulder: Secondary | ICD-10-CM

## 2020-03-12 DIAGNOSIS — M961 Postlaminectomy syndrome, not elsewhere classified: Secondary | ICD-10-CM

## 2020-03-12 DIAGNOSIS — M48062 Spinal stenosis, lumbar region with neurogenic claudication: Secondary | ICD-10-CM

## 2020-03-12 DIAGNOSIS — M4802 Spinal stenosis, cervical region: Secondary | ICD-10-CM

## 2020-03-12 DIAGNOSIS — M5416 Radiculopathy, lumbar region: Secondary | ICD-10-CM

## 2020-03-12 MED ORDER — BELBUCA 600 MCG BU FILM: 1.0000 | ORAL_FILM | Freq: Two times a day (BID) | BUCCAL | 1 refills | Status: AC

## 2020-03-12 NOTE — Progress Notes (Signed)
Patient: Lisa Good  Service Category: E/M  Provider: Gillis Santa, MD  DOB: 1950/12/27  DOS: 03/12/2020  Location: Office  MRN: [id number]  Setting: Ambulatory outpatient  Referring Provider: [name], MD  Type: Established Patient  Specialty: Interventional Pain Management  PCP: [name], MD  Location: Home  Delivery: TeleHealth     Virtual Encounter - Pain Management PROVIDER NOTE: Information contained herein reflects review and annotations entered in association with encounter. Interpretation of such information and data should be left to medically-trained personnel. Information provided to patient can be located elsewhere in the medical record under "Patient Instructions". Document created using STT-dictation technology, any transcriptional errors that may result from process are unintentional.    Contact & Pharmacy Preferred: [phone] Home: [phone] (home) Mobile: [phone] (mobile) E-mail: [email or web address]  [name]: [phone]: [phone]  Pre-screening  Ms. Speights offered "in-person" vs "virtual" encounter. She indicated preferring virtual for this encounter.   Reason COVID-19*  Social distancing based on CDC and AMA recommendations.   I contacted Giovana Theiss on 03/12/2020 via Televisit.      I clearly identified myself as BGillis Santa MD. I verified that I was speaking with the correct person using two identifiers (Name: SHaidyn Kilburg and date of birth: 105-21-1952.  Consent I sought verbal [facility]. I informed Ms. Dedic of possible security and privacy concerns, risks, and limitations associated with providing "not-in-person" medical evaluation and management services. I also informed Ms. Starry of the availability of "in-person" appointments. Finally, I informed her that there  would be a charge for the virtual visit and that she could be  personally, fully or partially, financially responsible for it. Ms. DWoodfordexpressed understanding and agreed to proceed.   Historic Elements   Ms. SAlexx Giambrais a 69y.o. year old, female patient evaluated today after her last contact with our practice on 03/12/2020. Ms. DRosten has a past medical history of Allergy, Arthritis, Depression, GERD (gastroesophageal reflux disease), Hyperlipidemia, Hypertension, Iron deficiency anemia (11/01/2019), Neuromuscular disorder ([place], and Sleep apnea. She also  has a past surgical history that includes Spine surgery; Joint replacement (Right); Tubal ligation; Brain surgery (2002); Extracorporeal shock wave lithotripsy (Right, 04/27/2019); Spinal fusion (07/10/2019); Colonoscopy with propofol (N/A, 11/10/2019); Esophagogastroduodenoscopy (egd) with propofol (N/A, 11/10/2019); polypectomy (11/10/2019); and Knee Arthroplasty (Left, 01/12/2020). Ms. DHammadhas a current medication list which includes the following prescription(s): alpha-lipoic acid, amlodipine, aripiprazole, atorvastatin, vitamin d3, gabapentin, oxycodone-acetaminophen, [START ON 03/18/2020] oxycodone-acetaminophen, senna-docusate, venlafaxine xr, vitamin b-12, albuterol, belbuca, enoxaparin, lisinopril, xtampza er, pantoprazole, and prednisone. She  reports that she quit smoking about 15 years ago. She has never used smokeless tobacco. She reports current alcohol use. She reports that she does not use drugs. Ms. DKlammeris allergic to contrast media [iodinated diagnostic agents] and cephalexin.   HPI  Today, she is being contacted for medication management.   Patient follows up virtually for medication management.  She states that she would like to transition back to her belbuca which is a long-acting opioid analgesic.  She states that the oxycodone is not as effective for chronic pain control as belbuca is.  Given that the patient has recovered from  her surgery and her acute pain from surgery has mainly resolved, reasonable to transition back to belbuca at her previous dose of 600 mcg twice daily.  Pharmacotherapy Assessment  Analgesic: Discontinue oxycodone.  Start belbuca previous dose of 600 mcg twice daily  Monitoring: [place] PMP: PDMP reviewed during this encounter.       Pharmacotherapy: No side-effects or adverse reactions reported. Compliance: No problems identified. Effectiveness: Clinically acceptable. Plan: Refer to "POC".  UDS: No results found for: SUMMARY  Laboratory Chemistry Profile   Renal Lab Results  Component Value Date   BUN 26 (H) 01/02/2020   CREATININE 1.02 (H) 01/02/2020   GFRAA >60 01/02/2020   GFRNONAA 56 (L) 01/02/2020     Hepatic Lab Results  Component Value Date   AST 19 01/02/2020   ALT 13 01/02/2020   ALBUMIN 3.8 01/02/2020   ALKPHOS 95 01/02/2020     Electrolytes Lab Results  Component Value Date   NA 140 01/02/2020   K 4.4 01/02/2020   CL 102 01/02/2020   CALCIUM 9.3 01/02/2020   MG 2.1 07/21/2019   PHOS 3.4 07/21/2019     Bone No results found for: VD25OH, VD125OH2TOT, TM5465KP5, WS5681EX5, 25OHVITD1, 25OHVITD2, 25OHVITD3, TESTOFREE, TESTOSTERONE   Inflammation (CRP: Acute Phase) (ESR: Chronic Phase) Lab Results  Component Value Date   CRP 0.7 01/02/2020   ESRSEDRATE 43 (H) 01/02/2020   LATICACIDVEN 1.4 07/20/2019       Note: Above Lab results reviewed.   Imaging  DG Knee Left Port CLINICAL DATA:  Total knee replacement  EXAM: PORTABLE LEFT KNEE - 1-2 VIEW  COMPARISON:  None.  FINDINGS: Total knee replacement in satisfactory position alignment. No fracture or complication. Soft tissue drains in the suprapatellar bursa.  IMPRESSION: Satisfactory left knee replacement.  Electronically Signed   By: Franchot Gallo M.D.   On: 01/12/2020 11:28  Assessment  The primary encounter diagnosis was Primary osteoarthritis of left knee. Diagnoses of Chronic pain  syndrome, Primary osteoarthritis of right shoulder, Hx of cervical spine surgery, Foraminal stenosis of cervical region, Cervical stenosis of spinal canal, Postlaminectomy syndrome of lumbosacral region, History of fusion of lumbar spine, Spinal stenosis of lumbar region with neurogenic claudication, and Chronic radicular lumbar pain were also pertinent to this visit.  Plan of Care   Ms. Ltanya Gowan has a current medication list which includes the following long-term medication(s): amlodipine, aripiprazole, atorvastatin, gabapentin, albuterol, enoxaparin, and lisinopril.  Pharmacotherapy (Medications Ordered): Meds ordered this encounter  Medications  . Buprenorphine HCl (BELBUCA) 600 MCG FILM    Sig: Place 1 Film inside cheek every 12 (twelve) hours.    Dispense:  60 each    Refill:  1    For chronic pain syndrome  Follow-up plan:   Return in about 8 weeks (around 05/07/2020) for Medication Management, in person, (UDS).    Recent Visits Date Type Provider Dept  02/06/20 Office Visit Gillis Santa, MD [facility]  12/19/19 Office Visit Gillis Santa, MD [facility]  Showing recent visits within past 90 days and meeting all other requirements   Today's Visits Date Type Provider Dept  03/12/20 Telemedicine Gillis Santa, MD [facility]  Showing today's visits and meeting all other requirements   Future Appointments Date Type Provider Dept  04/02/20 Appointment Gillis Santa, MD [facility]  Showing future appointments within next 90 days and meeting all other requirements   I discussed the assessment and treatment plan with the patient. The patient was provided an opportunity to ask questions and all were answered. The patient agreed with the plan and demonstrated an understanding of the instructions.  Patient advised to call back or seek an in-person evaluation if the symptoms  or condition worsens.  Duration of encounter: 30 minutes.  Note  by: Gillis Santa, MD Date: 03/12/2020; Time: 3:17 PM

## 2020-03-15 ENCOUNTER — Encounter: Payer: Self-pay | Admitting: Student in an Organized Health Care Education/Training Program

## 2020-04-01 ENCOUNTER — Telehealth: Payer: Self-pay | Admitting: Student in an Organized Health Care Education/Training Program

## 2020-04-01 NOTE — Telephone Encounter (Signed)
Patient lvmail at 1:17 today saying she is out of gabapentin. Next appt is August 2

## 2020-04-02 ENCOUNTER — Encounter: Payer: Medicare Other | Admitting: Student in an Organized Health Care Education/Training Program

## 2020-04-02 MED ORDER — GABAPENTIN 800 MG PO TABS: 800.0000 mg | ORAL_TABLET | Freq: Four times a day (QID) | ORAL | 3 refills | Status: DC

## 2020-04-05 ENCOUNTER — Other Ambulatory Visit: Payer: Self-pay | Admitting: Neurosurgery

## 2020-04-05 DIAGNOSIS — M545 Low back pain, unspecified: Secondary | ICD-10-CM

## 2020-04-05 DIAGNOSIS — Z981 Arthrodesis status: Secondary | ICD-10-CM

## 2020-04-05 DIAGNOSIS — G8929 Other chronic pain: Secondary | ICD-10-CM

## 2020-04-09 ENCOUNTER — Other Ambulatory Visit: Payer: Self-pay | Admitting: Neurosurgery

## 2020-04-09 DIAGNOSIS — Z981 Arthrodesis status: Secondary | ICD-10-CM

## 2020-04-09 DIAGNOSIS — M545 Low back pain, unspecified: Secondary | ICD-10-CM

## 2020-04-09 DIAGNOSIS — M4316 Spondylolisthesis, lumbar region: Secondary | ICD-10-CM

## 2020-04-09 DIAGNOSIS — G8929 Other chronic pain: Secondary | ICD-10-CM

## 2020-04-09 DIAGNOSIS — M419 Scoliosis, unspecified: Secondary | ICD-10-CM

## 2020-04-23 ENCOUNTER — Ambulatory Visit
Admission: RE | Admit: 2020-04-23 | Discharge: 2020-04-23 | Disposition: A | Discharge status: Home / Self Care | Payer: Medicare Other | Source: Ambulatory Visit | Attending: Neurosurgery | Admitting: Neurosurgery

## 2020-04-23 ENCOUNTER — Other Ambulatory Visit: Payer: Medicare Other

## 2020-04-23 ENCOUNTER — Ambulatory Visit: Payer: Medicare Other

## 2020-04-23 ENCOUNTER — Other Ambulatory Visit: Payer: Self-pay

## 2020-04-23 DIAGNOSIS — M419 Scoliosis, unspecified: Secondary | ICD-10-CM | POA: Diagnosis not present

## 2020-04-23 DIAGNOSIS — M5136 Other intervertebral disc degeneration, lumbar region: Secondary | ICD-10-CM | POA: Diagnosis present

## 2020-04-23 DIAGNOSIS — M545 Low back pain, unspecified: Secondary | ICD-10-CM

## 2020-04-23 DIAGNOSIS — Z981 Arthrodesis status: Secondary | ICD-10-CM | POA: Insufficient documentation

## 2020-04-23 DIAGNOSIS — M4316 Spondylolisthesis, lumbar region: Secondary | ICD-10-CM | POA: Insufficient documentation

## 2020-04-23 DIAGNOSIS — G8929 Other chronic pain: Secondary | ICD-10-CM | POA: Diagnosis present

## 2020-04-23 IMAGING — MR MR THORACIC SPINE W/O CM
5 of 6 series · 31 of 48 positions shown · non-contrast
Comparison: Spine radiographs [DATE]

CLINICAL DATA: Diffuse back pain for 5 years with progression.
Bilateral leg weakness. Multiple spinal surgeries including
cervicothoracic fusion and lumbar fusion.

EXAM:
MRI THORACIC SPINE WITHOUT CONTRAST
TECHNIQUE: Multiplanar, multisequence MR imaging of the thoracic spine was
performed. No intravenous contrast was administered.

[Series 16: T1 · sagittal · 5.0mm · 1.41mm/px · 4 of 9 slices shown (1 of 2)]
[im 1/9]
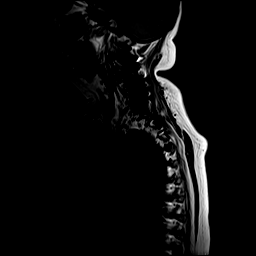
[im 3/9]
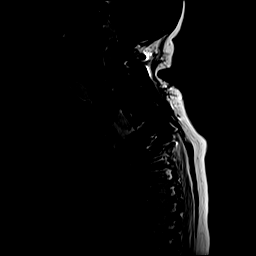
[im 6/9]
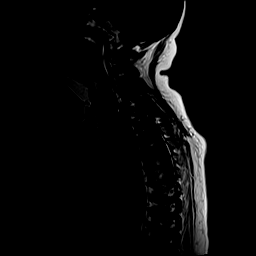
[im 9/9]
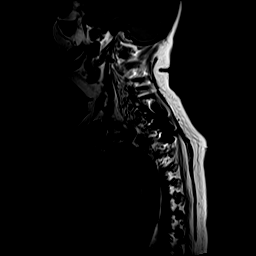

[Series 17: T2 · sagittal · 3.0mm · 1.06mm/px · 6 of 19 slices shown (1 of 2)]
[im 1/19]
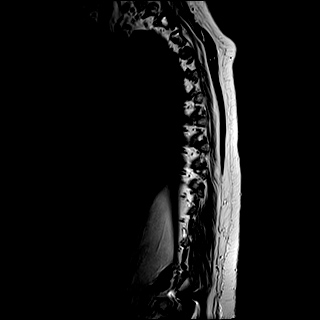
[im 4/19]
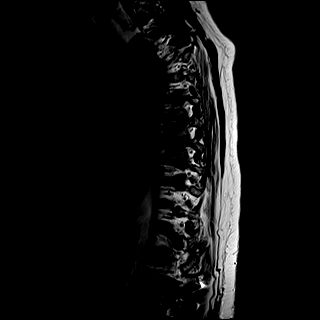
[im 8/19]
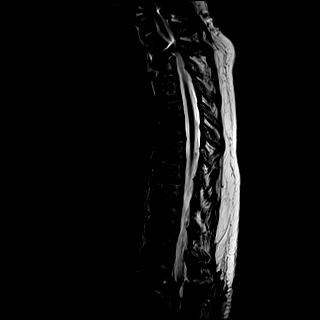
[im 11/19]
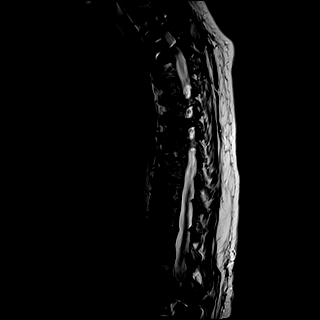
[im 15/19]
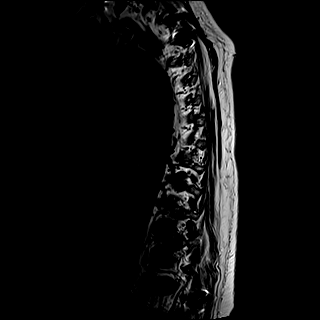
[im 19/19]
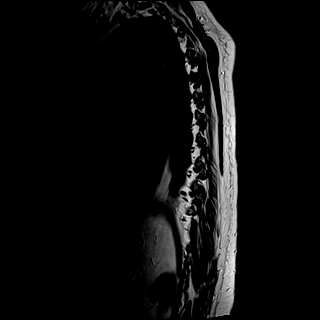

[Series 18: T1 · sagittal · 3.0mm · 1.06mm/px · 6 of 19 slices shown (2 of 2)]
[im 1/19]
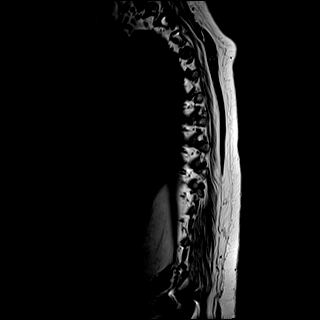
[im 4/19]
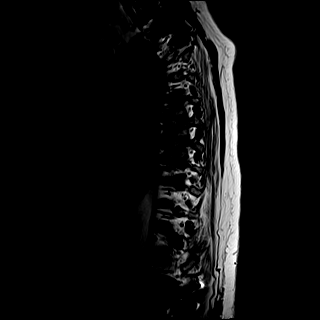
[im 8/19]
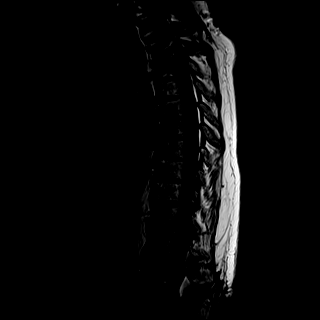
[im 11/19]
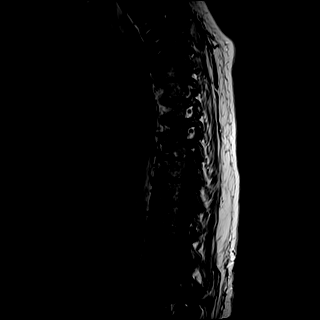
[im 15/19]
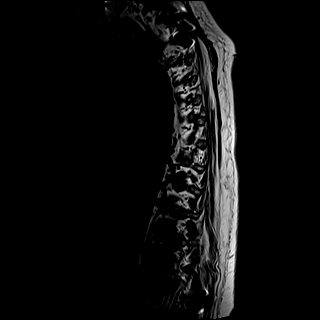
[im 19/19]
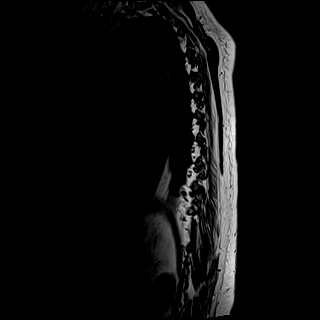

[Series 19: STIR · sagittal · 3.0mm · 0.53mm/px · 6 of 19 slices shown]
[im 1/19]
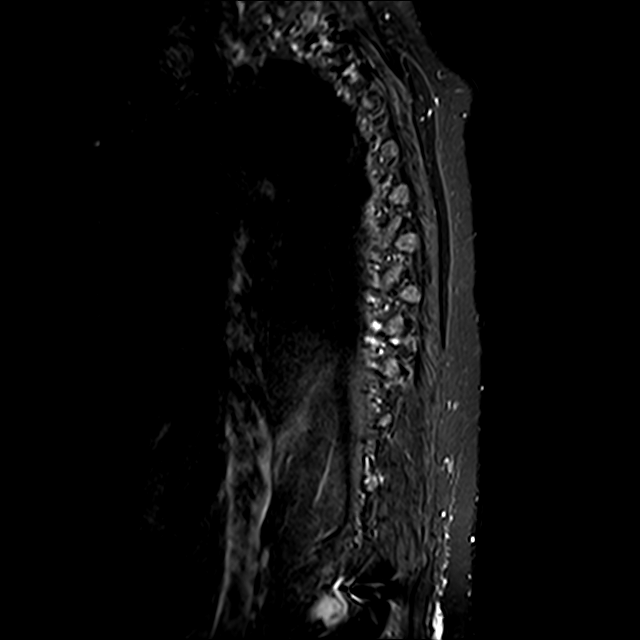
[im 4/19]
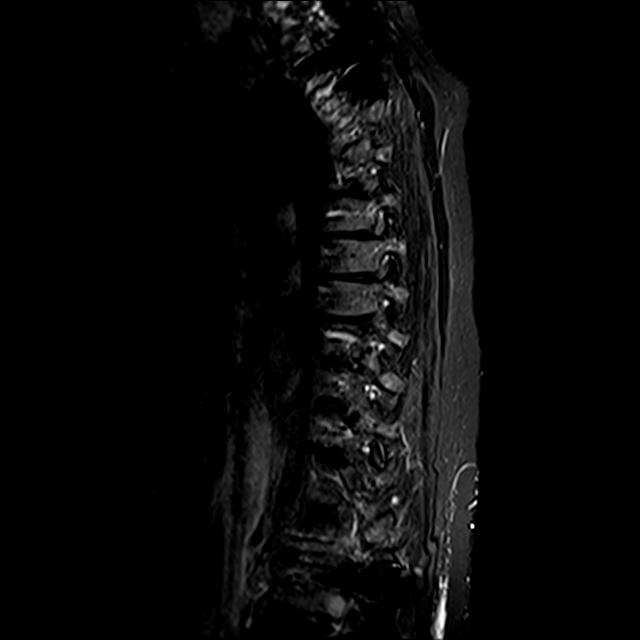
[im 8/19]
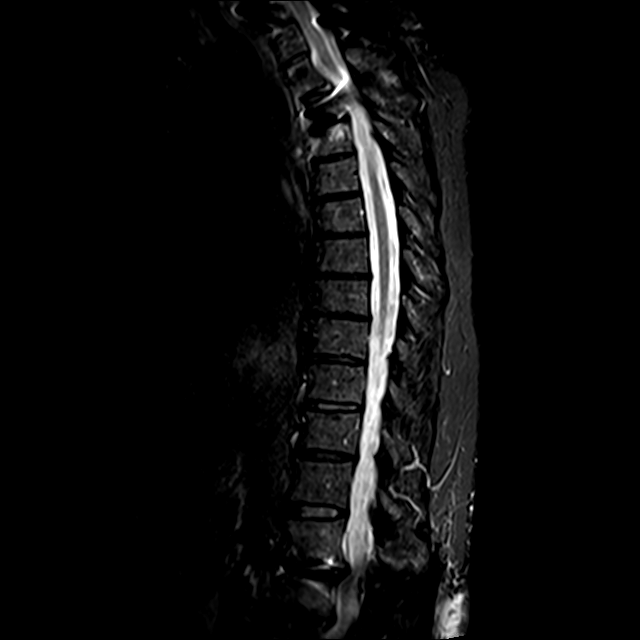
[im 11/19]
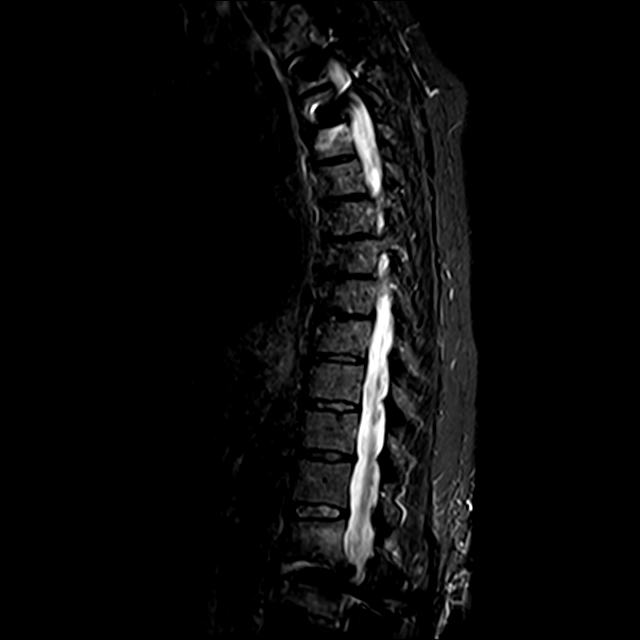
[im 15/19]
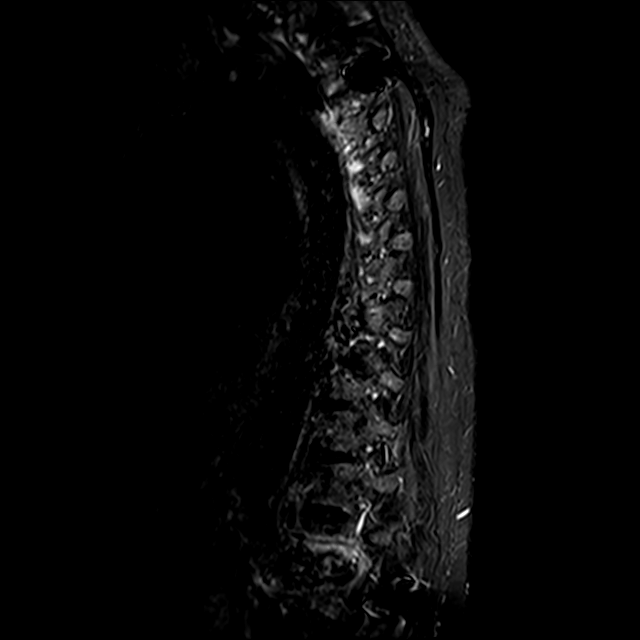
[im 19/19]
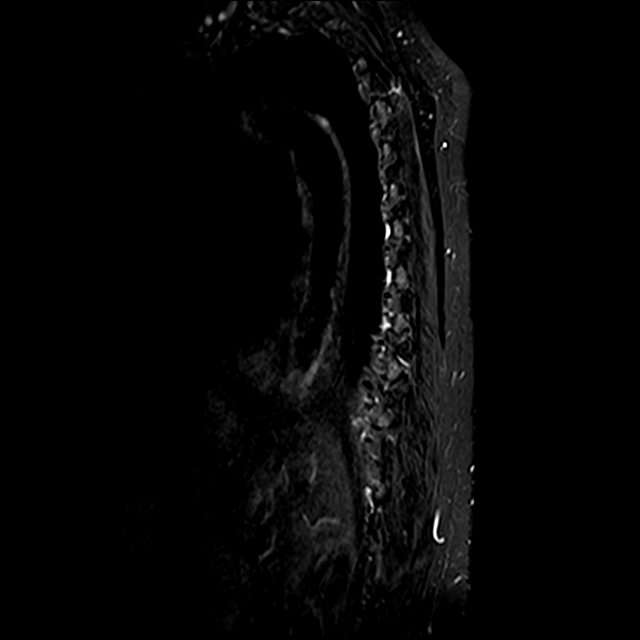

[Series 20: T2 · axial · 4.0mm · 0.59mm/px · z∈[-170,+44]mm · 9 of 39 slices shown (2 of 2)]
[im 1/39]
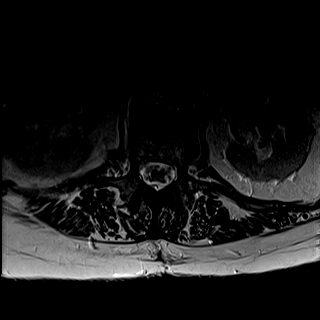
[im 7/39]
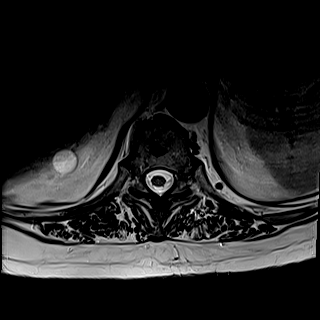
[im 13/39]
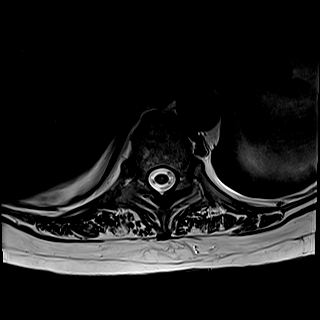
[im 16/39]
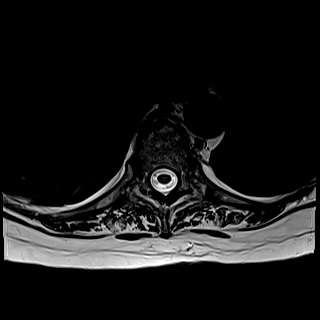
[im 20/39]
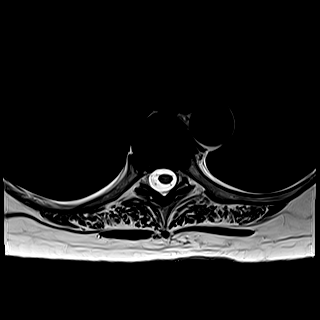
[im 23/39]
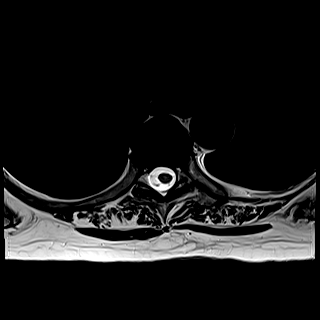
[im 26/39]
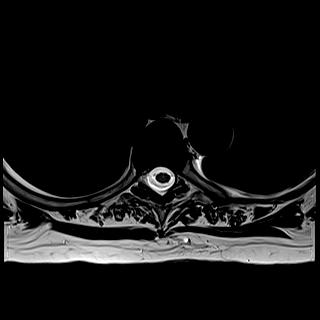
[im 32/39]
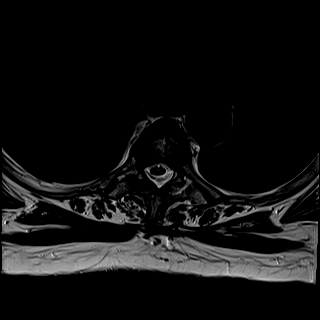
[im 39/39]
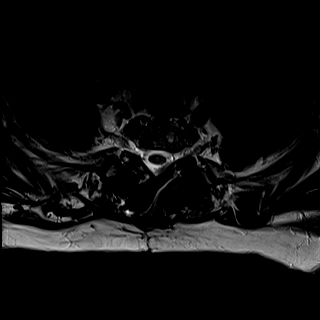

[31 of 48 positions shown; findings below may reference images not displayed]

FINDINGS: Alignment: No significant listhesis is present. There is some
straightening of the normal thoracic kyphosis. Mild rightward
curvature is centered at T7-8.

Vertebrae: Vertebral body heights are maintained. Superior endplate
Schmorl's node is present at T11. Chronic endplate marrow changes
are noted at T7-8 and T8-9, left greater than right. Signal loss is
cyst seated with posterior pedicle screws at T2 and T3 hemangioma is
noted inferiorly on the left at T1.

Cord: Normal signal is present throughout the thoracic spinal cord
to the conus medullaris which terminates at L1-2. Normal cord
morphology is present.

Paraspinal and other soft tissues: Paraspinous soft tissues are
within normal limits. The visualized lung fields are clear. 14 mm
simple cyst is present at the upper pole of the right kidney.

Disc levels:

Minimal disc bulging is present at T7-8 and T8-9. No significant
stenosis is present. The central canal is patent. No significant
foraminal narrowing is present in the thoracic spine.
IMPRESSION: 1. Minimal disc bulging at T7-8 and T8-9 without significant central
canal or foraminal stenosis.
2. Posterior fusion hardware at T2 and T3.
3. No acute abnormality or focal lesion in the thoracic spine to
explain the patient's symptoms.

## 2020-04-25 ENCOUNTER — Other Ambulatory Visit: Payer: Self-pay | Admitting: Oncology

## 2020-05-06 ENCOUNTER — Encounter: Payer: Self-pay | Admitting: Student in an Organized Health Care Education/Training Program

## 2020-05-06 ENCOUNTER — Other Ambulatory Visit: Payer: Self-pay

## 2020-05-06 ENCOUNTER — Telehealth: Payer: Self-pay | Admitting: *Deleted

## 2020-05-06 ENCOUNTER — Ambulatory Visit
Payer: Medicare Other | Attending: Student in an Organized Health Care Education/Training Program | Admitting: Student in an Organized Health Care Education/Training Program

## 2020-05-06 VITALS — BP 145/77 | Temp 98.7°F | Resp 18 | Ht 64.0 in | Wt 185.0 lb

## 2020-05-06 DIAGNOSIS — M961 Postlaminectomy syndrome, not elsewhere classified: Secondary | ICD-10-CM | POA: Diagnosis present

## 2020-05-06 DIAGNOSIS — K219 Gastro-esophageal reflux disease without esophagitis: Secondary | ICD-10-CM

## 2020-05-06 DIAGNOSIS — G8929 Other chronic pain: Secondary | ICD-10-CM | POA: Diagnosis present

## 2020-05-06 DIAGNOSIS — M19011 Primary osteoarthritis, right shoulder: Secondary | ICD-10-CM

## 2020-05-06 DIAGNOSIS — G894 Chronic pain syndrome: Secondary | ICD-10-CM | POA: Diagnosis present

## 2020-05-06 DIAGNOSIS — M5416 Radiculopathy, lumbar region: Secondary | ICD-10-CM | POA: Insufficient documentation

## 2020-05-06 DIAGNOSIS — M4802 Spinal stenosis, cervical region: Secondary | ICD-10-CM | POA: Diagnosis present

## 2020-05-06 DIAGNOSIS — Z981 Arthrodesis status: Secondary | ICD-10-CM | POA: Diagnosis present

## 2020-05-06 DIAGNOSIS — M48062 Spinal stenosis, lumbar region with neurogenic claudication: Secondary | ICD-10-CM | POA: Diagnosis present

## 2020-05-06 DIAGNOSIS — M1712 Unilateral primary osteoarthritis, left knee: Secondary | ICD-10-CM | POA: Diagnosis not present

## 2020-05-06 DIAGNOSIS — Z9889 Other specified postprocedural states: Secondary | ICD-10-CM

## 2020-05-06 MED ORDER — OXYCODONE-ACETAMINOPHEN 10-325 MG PO TABS: 1.0000 | ORAL_TABLET | ORAL | 0 refills | Status: DC | PRN

## 2020-05-06 MED ORDER — PANTOPRAZOLE SODIUM 40 MG PO TBEC: 40.0000 mg | DELAYED_RELEASE_TABLET | Freq: Every day | ORAL | 1 refills | Status: DC

## 2020-05-06 NOTE — Patient Instructions (Addendum)
In anticipation of your surgery on August 30, recommend that you discontinue your belbuca 2 weeks from today which would be August 16.  You can start oxycodone at that time.  Optimize postoperative pain management, it is in your best interest to take the lowest effective dose possible prior to your surgery to decrease your tolerance.  You will be provided a prescription for 2 months for oxycodone 10 mg maximum 5 tablets a day, quantity 150/month.   Discuss intraoperative ketamine infusion with your anesthesia care team day of surgery to help with post-op pain.

## 2020-05-06 NOTE — Progress Notes (Signed)
Nursing Pain Medication Assessment:  Safety precautions to be maintained throughout the outpatient stay will include: orient to surroundings, keep bed in low position, maintain call bell within reach at all times, provide assistance with transfer out of bed and ambulation.  Medication Inspection Compliance: Lisa Good did not comply with our request to bring her pills to be counted. She was reminded that bringing the medication bottles, even when empty, is a requirement.  Medication: None brought in. Pill/Patch Count: None available to be counted. Bottle Appearance: No container available. Did not bring bottle(s) to appointment. Filled Date: N/A Last Medication intake:  Yesterday

## 2020-05-06 NOTE — Progress Notes (Signed)
PROVIDER NOTE: Information contained herein reflects review and annotations entered in association with encounter. Interpretation of such information and data should be left to medically-trained personnel. Information provided to patient can be located elsewhere in the medical record under "Patient Instructions". Document created using STT-dictation technology, any transcriptional errors that may result from process are unintentional.    Patient: Lisa Good  Service Category: E/M  Provider: Gillis Santa, MD  DOB: 02/22/1951  DOS: 05/06/2020  Specialty: Interventional Pain Management  MRN: [id number]  Setting: Ambulatory outpatient  PCP: [name], MD  Type: Established Patient    Referring Provider: [name], MD  Location: Office  Delivery: Face-to-face     HPI  Reason for encounter: [name], a 69 y.o. year old female, is here today for evaluation and management of her Chronic radicular lumbar pain [M54.16, G89.29]. [name] primary complain today is Back Pain (low) Last encounter: Practice (04/01/2020). My last encounter with her was on 04/01/2020. Pertinent problems: [name] does not have any pertinent problems on file. Pain Assessment: Severity of   is reported as a 5 /10. Location: Back Lower/denies. Onset: More than a month ago. Quality: Constant, Aching. Timing: Constant. Modifying factor(s): medications, rest. Vitals:  height is 5' 4"  (1.626 m) and weight is 185 lb (83.9 kg). Her oral temperature is 98.7 F (37.1 C). Her blood pressure is 145/77 (abnormal). Her respiration is 18 and oxygen saturation is 98%.   Patient presents today for medication management for chronic pain syndrome.  No significant changes in her medical history since her last visit with me.  Of note she is having an extensive spine surgery including T10 to pelvis posterior spinal fusion along with L1-L2 lumbar interbody fusion with Dr. Cari Lisa Good on 06/03/2020.  She continues her Belbuca  600 mcg twice daily.  In anticipation of her surgery later this month, instructed the patient to discontinue her belbuca in 2 weeks, May 20, 2020.  I provided her with a prescription of oxycodone, 10 mg every 4 to 6 hours as needed, quantity 150; not to exceed 5 tablets a day.  Given that belbuca is a partial opioid agonist, it is recommended that she discontinue this prior to her extensive lumbar spine surgery.  She will continue oxycodone 10 mg every 4-6 hours as needed which is her chronic pain equivalent.  Surgery team can prescribe what ever they deem is appropriate from a postoperative pain standpoint that she can take on top of her oxycodone that I am prescribing.  Patient endorsed understanding.  We will also obtain urine toxicology screen for medication compliance monitoring.  Patient wanted that she needs to bring in medications at every visit.  Pharmacotherapy Assessment   Analgesic: Belbuca 600 mcg film twice daily Monitoring: [place] PMP: PDMP not reviewed this encounter.       Pharmacotherapy: No side-effects or adverse reactions reported. Compliance: No problems identified. Effectiveness: Clinically acceptable.  Lisa Rochester, RN  05/06/2020  1:11 PM  Sign when Signing Visit Nursing Pain Medication Assessment:  Safety precautions to be maintained throughout the outpatient stay will include: orient to surroundings, keep bed in low position, maintain call bell within reach at all times, provide assistance with transfer out of bed and ambulation.  Medication Inspection Compliance: [name] did not comply with our request to bring her pills to be counted. She was reminded that bringing the medication bottles, even when empty, is a requirement.  Medication: None brought in. Pill/Patch Count: None available to be  counted. Bottle Appearance: No container available. Did not bring bottle(s) to appointment. Filled Date: N/A Last Medication intake:  Yesterday    UDS: No results  found for: SUMMARY   ROS  Constitutional: Denies any fever or chills Gastrointestinal: No reported hemesis, hematochezia, vomiting, or acute GI distress Musculoskeletal: Mid back, low back pain, bilateral leg pain, neck pain Neurological: Paresthesias of lower extremities  Medication Review  Alpha-Lipoic Acid, Buprenorphine HCl, Vitamin D3, amLODipine, atorvastatin, enoxaparin, gabapentin, lisinopril, oxyCODONE-acetaminophen, pantoprazole, senna-docusate, venlafaxine XR, and vitamin B-12  History Review  Allergy: [name] is allergic to contrast media [iodinated diagnostic agents] and cephalexin. Drug: [name]  reports no history of drug use. Alcohol:  reports current alcohol use. Tobacco:  reports that she quit smoking about 15 years ago. She has never used smokeless tobacco. Social: [name]  reports that she quit smoking about 15 years ago. She has never used smokeless tobacco. She reports current alcohol use. She reports that she does not use drugs. Medical:  has a past medical history of Allergy, Arthritis, Depression, GERD (gastroesophageal reflux disease), Hyperlipidemia, Hypertension, Iron deficiency anemia (11/01/2019), Neuromuscular disorder ([place]), and Sleep apnea. Surgical: [name]  has a past surgical history that includes Spine surgery; Joint replacement (Right); Tubal ligation; Brain surgery (2002); Extracorporeal shock wave lithotripsy (Right, 04/27/2019); Spinal fusion (07/10/2019); Colonoscopy with propofol (N/A, 11/10/2019); Esophagogastroduodenoscopy (egd) with propofol (N/A, 11/10/2019); polypectomy (11/10/2019); and Knee Arthroplasty (Left, 01/12/2020). Family: family history includes Depression in her brother and sister; Hyperlipidemia in her sister; Hypertension in her mother and sister.  Laboratory Chemistry Profile   Renal Lab Results  Component Value Date   BUN 26 (H) 01/02/2020   CREATININE 1.02 (H) 01/02/2020   GFRAA >60 01/02/2020   GFRNONAA 56 (L) 01/02/2020      Hepatic Lab Results  Component Value Date   AST 19 01/02/2020   ALT 13 01/02/2020   ALBUMIN 3.8 01/02/2020   ALKPHOS 95 01/02/2020     Electrolytes Lab Results  Component Value Date   NA 140 01/02/2020   K 4.4 01/02/2020   CL 102 01/02/2020   CALCIUM 9.3 01/02/2020   MG 2.1 07/21/2019   PHOS 3.4 07/21/2019     Bone No results found for: VD25OH, VD125OH2TOT, HQ4696EX5, MW4132GM0, 25OHVITD1, 25OHVITD2, 25OHVITD3, TESTOFREE, TESTOSTERONE   Inflammation (CRP: Acute Phase) (ESR: Chronic Phase) Lab Results  Component Value Date   CRP 0.7 01/02/2020   ESRSEDRATE 43 (H) 01/02/2020   LATICACIDVEN 1.4 07/20/2019       Note: Above Lab results reviewed.  Recent Imaging Review  MR LUMBAR SPINE WO CONTRAST CLINICAL DATA:  Diffuse back pain with progression. Bilateral leg weakness.  EXAM: MRI LUMBAR SPINE WITHOUT CONTRAST  TECHNIQUE: Multiplanar, multisequence MR imaging of the lumbar spine was performed. No intravenous contrast was administered.  COMPARISON:  Lumbar spine radiographs 10/12/2019. CT abdomen pelvis 07/20/2019  FINDINGS: Segmentation: 5 non rib-bearing lumbar type vertebral bodies are present. The lowest fully formed vertebral body is L5.  Alignment: Straightening of the normal lumbar lordosis is present following fusion L2-S1.  Vertebrae: Chronic sclerotic changes are present at L1-2. Signal is distorted by hardware L2-S1. Vertebral body heights are maintained.  Conus medullaris and cauda equina: Conus extends to the L1-2 level. Conus and cauda equina appear normal.  Paraspinal and other soft tissues: Limited imaging the abdomen is unremarkable. There is no significant adenopathy. No solid organ lesions are present.  Disc levels:  Posterior canal is decompressed fell laminectomy at L2, L3, L4, and  L5. Foramina are widely patent at L2-3, L3-4, L4-5, and L5-S1.  Adjacent level disease is present at L1-2 broad-based disc protrusion and  advanced facet hypertrophy resulting in moderate to severe central canal stenosis. Mild right foraminal narrowing is present.  IMPRESSION: 1. L2-S1 fusion following fusion with decompression of the central canal and foramina at these levels. 2. Adjacent level disease at L1-2 with moderate to severe central canal stenosis and mild right foraminal narrowing. This is the most likely etiology of the patient's progressive pain and bilateral lower extremity weakness.  Electronically Signed   By: San Morelle M.D.   On: 04/24/2020 09:47 MR THORACIC SPINE WO CONTRAST CLINICAL DATA:  Diffuse back pain for 5 years with progression. Bilateral leg weakness. Multiple spinal surgeries including cervicothoracic fusion and lumbar fusion.  EXAM: MRI THORACIC SPINE WITHOUT CONTRAST  TECHNIQUE: Multiplanar, multisequence MR imaging of the thoracic spine was performed. No intravenous contrast was administered.  COMPARISON:  Spine radiographs 10/12/2019  FINDINGS: Alignment: No significant listhesis is present. There is some straightening of the normal thoracic kyphosis. Mild rightward curvature is centered at T7-8.  Vertebrae: Vertebral body heights are maintained. Superior endplate Schmorl's node is present at T11. Chronic endplate marrow changes are noted at T7-8 and T8-9, left greater than right. Signal loss is cyst seated with posterior pedicle screws at T2 and T3 hemangioma is noted inferiorly on the left at T1.  Cord: Normal signal is present throughout the thoracic spinal cord to the conus medullaris which terminates at L1-2. Normal cord morphology is present.  Paraspinal and other soft tissues: Paraspinous soft tissues are within normal limits. The visualized lung fields are clear. 14 mm simple cyst is present at the upper pole of the right kidney.  Disc levels:  Minimal disc bulging is present at T7-8 and T8-9. No significant stenosis is present. The central canal is  patent. No significant foraminal narrowing is present in the thoracic spine.  IMPRESSION: 1. Minimal disc bulging at T7-8 and T8-9 without significant central canal or foraminal stenosis. 2. Posterior fusion hardware at T2 and T3. 3. No acute abnormality or focal lesion in the thoracic spine to explain the patient's symptoms.  Electronically Signed   By: San Morelle M.D.   On: 04/24/2020 09:42 Note: Reviewed        Physical Exam  General appearance: alert, cooperative and in mild distress Mental status: Alert, oriented x 3 (person, place, & time)       Respiratory: No evidence of acute respiratory distress Eyes: PERLA Vitals: BP (!) 145/77   Temp 98.7 F (37.1 C) (Oral)   Resp 18   Ht 5' 4"  (1.626 m)   Wt 185 lb (83.9 kg)   SpO2 98%   BMI 31.76 kg/m  BMI: Estimated body mass index is 31.76 kg/m as calculated from the following:   Height as of this encounter: 5' 4"  (1.626 m).   Weight as of this encounter: 185 lb (83.9 kg). Ideal: Ideal body weight: 54.7 kg (120 lb 9.5 oz) Adjusted ideal body weight: 66.4 kg (146 lb 5.7 oz)   Cervical Spine Area Exam  Skin & Axial Inspection: Well healed scar from previous spine surgery detected Alignment: Symmetrical Functional ROM: Decreased ROM, bilaterally Stability: No instability detected Muscle Tone/Strength: Functionally intact. No obvious neuro-muscular anomalies detected. Sensory (Neurological): Dermatomal pain pattern Palpation: No palpable anomalies             Lumbar Spine Area Exam  Skin & Axial Inspection: Well healed scar  from previous spine surgery detected Alignment: Symmetrical Functional ROM: Pain restricted ROM affecting both sides Stability: No instability detected Muscle Tone/Strength: Functionally intact. No obvious neuro-muscular anomalies detected. Sensory (Neurological): Dermatomal pain pattern  Lower Extremity Exam    Side: Right lower extremity  Side: Left lower extremity  Stability: No  instability observed          Stability: No instability observed          Skin & Extremity Inspection: Skin color, temperature, and hair growth are WNL. No peripheral edema or cyanosis. No masses, redness, swelling, asymmetry, or associated skin lesions. No contractures.  Skin & Extremity Inspection: Skin color, temperature, and hair growth are WNL. No peripheral edema or cyanosis. No masses, redness, swelling, asymmetry, or associated skin lesions. No contractures.  Functional ROM: Decreased ROM for hip and knee joints          Functional ROM: Decreased ROM for hip and knee joints          Muscle Tone/Strength: Functionally intact. No obvious neuro-muscular anomalies detected.  Muscle Tone/Strength: Functionally intact. No obvious neuro-muscular anomalies detected.  Sensory (Neurological): Neurogenic pain pattern        Sensory (Neurological): Neurogenic pain pattern        DTR: Patellar: deferred today Achilles: deferred today Plantar: deferred today  DTR: Patellar: deferred today Achilles: deferred today Plantar: deferred today  Palpation: No palpable anomalies  Palpation: No palpable anomalies    Assessment   Status Diagnosis  Persistent Persistent Persistent 1. Chronic radicular lumbar pain   2. Spinal stenosis of lumbar region with neurogenic claudication   3. Postlaminectomy syndrome of lumbosacral region   4. History of fusion of lumbar spine   5. Primary osteoarthritis of left knee   6. Primary osteoarthritis of right shoulder   7. Hx of cervical spine surgery   8. Foraminal stenosis of cervical region   9. Cervical stenosis of spinal canal   10. Gastroesophageal reflux disease without esophagitis   11. Chronic pain syndrome      Plan of Care   Ms. Deniss Mis has a current medication list which includes the following long-term medication(s): amlodipine, atorvastatin, gabapentin, lisinopril, pantoprazole, and enoxaparin.  1.  Discontinue belbuca in 2 weeks, May 20, 2020 in anticipation of surgery August 30.  Start oxycodone as below.  Patient instructed to take lowest effective dose possible prior to surgery so that she can reduce her tolerance and that these medications can be utilized effectively in the postoperative period to help manage her pain.  I also recommend the anesthesia care team consider intraoperative ketamine infusion to help with postoperative pain and which may be opioid sparing as well.  2.  Prescription for oxycodone as below for 2 months.  3.  Protonix as below for GERD.  4.  Urine toxicology screen for medication compliance and monitoring given chronic pain syndrome.  Pharmacotherapy (Medications Ordered): Meds ordered this encounter  Medications  . pantoprazole (PROTONIX) 40 MG tablet    Sig: Take 1 tablet (40 mg total) by mouth daily.    Dispense:  30 tablet    Refill:  1  . oxyCODONE-acetaminophen (PERCOCET) 10-325 MG tablet    Sig: Take 1 tablet by mouth every 4 (four) hours as needed for pain. Must last 30 days.    Dispense:  150 tablet    Refill:  0    Chronic Pain. (STOP Act - Not applicable). Fill one day early if closed on scheduled refill date. Maximum, 5  tablets/day  . oxyCODONE-acetaminophen (PERCOCET) 10-325 MG tablet    Sig: Take 1 tablet by mouth every 4 (four) hours as needed for pain. Must last 30 days.    Dispense:  150 tablet    Refill:  0    Chronic Pain. (STOP Act - Not applicable). Fill one day early if closed on scheduled refill date. Maximum, 5 tablets/day   Orders:  Orders Placed This Encounter  Procedures  . ToxASSURE Select 13 (MW), Urine    Volume: 30 ml(s). Minimum 3 ml of urine is needed. Document temperature of fresh sample. Indications: Long term (current) use of opiate analgesic [phone])    Order Specific Question:   Release to patient    Answer:   Immediate   Follow-up plan:   Return in about 2 months (around 07/09/2020) for Medication Management, in person.   Recent Visits Date  Type Provider Dept  03/12/20 Telemedicine Lisa Santa, MD [facility]  02/06/20 Office Visit Lisa Santa, MD [facility]  Showing recent visits within past 90 days and meeting all other requirements Today's Visits Date Type Provider Dept  05/06/20 Office Visit Lisa Santa, MD [facility]  Showing today's visits and meeting all other requirements Future Appointments No visits were found meeting these conditions. Showing future appointments within next 90 days and meeting all other requirements  I discussed the assessment and treatment plan with the patient. The patient was provided an opportunity to ask questions and all were answered. The patient agreed with the plan and demonstrated an understanding of the instructions.  Patient advised to call back or seek an in-person evaluation if the symptoms or condition worsens.  Duration of encounter: 35 minutes.  Note by: Lisa Santa, MD Date: 05/06/2020; Time: 2:29 PM

## 2020-05-07 ENCOUNTER — Encounter: Payer: Medicare Other | Admitting: Student in an Organized Health Care Education/Training Program

## 2020-05-08 LAB — TOXASSURE SELECT 13 (MW), URINE

## 2020-05-16 ENCOUNTER — Ambulatory Visit: Payer: Medicare Other | Admitting: Urology

## 2020-05-20 ENCOUNTER — Telehealth: Payer: Self-pay | Admitting: Student in an Organized Health Care Education/Training Program

## 2020-05-20 ENCOUNTER — Other Ambulatory Visit: Payer: Self-pay | Admitting: Student in an Organized Health Care Education/Training Program

## 2020-05-20 ENCOUNTER — Telehealth: Payer: Self-pay

## 2020-05-20 MED ORDER — BELBUCA 600 MCG BU FILM: 1.0000 | ORAL_FILM | Freq: Two times a day (BID) | BUCCAL | 0 refills | Status: DC

## 2020-05-20 NOTE — Telephone Encounter (Signed)
Returned patients phone call. She states that her back surgery has been changed to Oct 01/2020 and does not need the prescription for Oxycodone. She does say that she needs another prescription for Belbuca. Pharmacy [facility].   Please let me know if this is something you would do, so I can call and let her know. Thanks

## 2020-05-20 NOTE — Telephone Encounter (Signed)
Called pharmacy and discontinued 3 prescriptions for Oxycodone 10/368m. Patient back surgery has been reschedule for 07/09/2020. Dr LHolley Raringto prescribe Belbuca. Patient will need a F2F approx. 3 weeks before her surgery. Called Lisa Good and informed her of the above.patient with understanding.

## 2020-05-20 NOTE — Telephone Encounter (Signed)
Patient's shoulder surgery has been pushed out to October. She will need her meds switched back to [facility]. Please call patient when she can pick up. Last visit 05-06-20

## 2020-05-20 NOTE — Progress Notes (Signed)
Please see nursing communication.  Patient surgery has been rescheduled to early October.  She was supposed to transition from belbuca to oxycodone given her upcoming surgery but given that it has been postponed, will defer transitioning to oxycodone at this time and have her continue belbuca as she was on previously.  Oxycodone prescriptions have been canceled from pharmacy and have been documented.  New prescription for belbuca as below.  Patient instructed to follow-up face-to-face 3 to 4 weeks prior to her scheduled surgery to have her discontinue belbuca and will write new prescription for oxycodone at that time for perioperative pain management.  PMP checked and appropriate.  Recommend next visit prior to 06/24/2020.   Requested Prescriptions   Signed Prescriptions Disp Refills  . Buprenorphine HCl (BELBUCA) 600 MCG FILM 60 each 0    Sig: Place 1 Film inside cheek 2 (two) times daily.

## 2020-06-04 ENCOUNTER — Other Ambulatory Visit: Payer: Self-pay | Admitting: Student in an Organized Health Care Education/Training Program

## 2020-06-04 DIAGNOSIS — K219 Gastro-esophageal reflux disease without esophagitis: Secondary | ICD-10-CM

## 2020-06-13 ENCOUNTER — Encounter: Payer: Self-pay | Admitting: Student in an Organized Health Care Education/Training Program

## 2020-06-13 ENCOUNTER — Other Ambulatory Visit: Payer: Self-pay

## 2020-06-13 ENCOUNTER — Ambulatory Visit
Payer: Medicare Other | Attending: Student in an Organized Health Care Education/Training Program | Admitting: Student in an Organized Health Care Education/Training Program

## 2020-06-13 VITALS — BP 155/68 | HR 91 | Temp 97.5°F | Resp 16 | Ht 63.0 in | Wt 189.0 lb

## 2020-06-13 DIAGNOSIS — N183 Chronic kidney disease, stage 3 unspecified: Secondary | ICD-10-CM | POA: Insufficient documentation

## 2020-06-13 DIAGNOSIS — Z981 Arthrodesis status: Secondary | ICD-10-CM | POA: Insufficient documentation

## 2020-06-13 DIAGNOSIS — M4802 Spinal stenosis, cervical region: Secondary | ICD-10-CM

## 2020-06-13 DIAGNOSIS — G894 Chronic pain syndrome: Secondary | ICD-10-CM | POA: Diagnosis present

## 2020-06-13 DIAGNOSIS — M1712 Unilateral primary osteoarthritis, left knee: Secondary | ICD-10-CM | POA: Diagnosis present

## 2020-06-13 DIAGNOSIS — M961 Postlaminectomy syndrome, not elsewhere classified: Secondary | ICD-10-CM

## 2020-06-13 DIAGNOSIS — M19011 Primary osteoarthritis, right shoulder: Secondary | ICD-10-CM

## 2020-06-13 DIAGNOSIS — M5416 Radiculopathy, lumbar region: Secondary | ICD-10-CM

## 2020-06-13 DIAGNOSIS — Z9889 Other specified postprocedural states: Secondary | ICD-10-CM | POA: Diagnosis present

## 2020-06-13 DIAGNOSIS — M48062 Spinal stenosis, lumbar region with neurogenic claudication: Secondary | ICD-10-CM

## 2020-06-13 DIAGNOSIS — G8929 Other chronic pain: Secondary | ICD-10-CM | POA: Diagnosis present

## 2020-06-13 MED ORDER — OXYCODONE-ACETAMINOPHEN 10-325 MG PO TABS: 1.0000 | ORAL_TABLET | ORAL | 0 refills | Status: AC | PRN

## 2020-06-13 MED ORDER — BELBUCA 600 MCG BU FILM: 600.0000 ug | ORAL_FILM | Freq: Two times a day (BID) | BUCCAL | 0 refills | Status: AC

## 2020-06-13 NOTE — Patient Instructions (Signed)
Please discuss intraoperative ketamine infusion with your anesthesia care team on the day of your procedure which could help with your intraoperative and perioperative pain.

## 2020-06-13 NOTE — Progress Notes (Signed)
Nursing Pain Medication Assessment:  Safety precautions to be maintained throughout the outpatient stay will include: orient to surroundings, keep bed in low position, maintain call bell within reach at all times, provide assistance with transfer out of bed and ambulation.  Medication Inspection Compliance: Pill count conducted under aseptic conditions, in front of the patient. Neither the pills nor the bottle was removed from the patient's sight at any time. Once count was completed pills were immediately returned to the patient in their original bottle.  Medication: Buprenorphine (Suboxone) Pill/Patch Count: 11 of 60 pills remain Pill/Patch Appearance: Markings consistent with prescribed medication Bottle Appearance: Standard pharmacy container. Clearly labeled. Filled Date: 8 / 44 / 21 Last Medication intake:  TodaySafety precautions to be maintained throughout the outpatient stay will include: orient to surroundings, keep bed in low position, maintain call bell within reach at all times, provide assistance with transfer out of bed and ambulation.

## 2020-06-13 NOTE — Progress Notes (Signed)
PROVIDER NOTE: Information contained herein reflects review and annotations entered in association with encounter. Interpretation of such information and data should be left to medically-trained personnel. Information provided to patient can be located elsewhere in the medical record under "Patient Instructions". Document created using STT-dictation technology, any transcriptional errors that may result from process are unintentional.    Patient: Lisa Good  Service Category: E/M  Provider: Gillis Santa, MD  DOB: 19-Apr-1951  DOS: 06/13/2020  Specialty: Interventional Pain Management  MRN: [id number]  Setting: Ambulatory outpatient  PCP: [name], MD  Type: Established Patient    Referring Provider: [name], MD  Location: Office  Delivery: Face-to-face     HPI  Reason for encounter: [name], a 69 y.o. year old female, is here today for evaluation and management of her Chronic radicular lumbar pain [M54.16, G89.29]. [name] primary complain today is Back Pain Last encounter: Practice (06/04/2020). My last encounter with her was on 06/04/2020. Pertinent problems: [name] has Cervical spondylosis without myelopathy; Foraminal stenosis of cervical region; Cervical stenosis of spinal canal; History of fusion of lumbar spine; Spinal stenosis of lumbar region with neurogenic claudication; Postlaminectomy syndrome of lumbosacral region; Chronic pain syndrome; Hx of cervical spine surgery; Primary osteoarthritis of left knee; Primary osteoarthritis of right shoulder; Chronic radicular lumbar pain; Cervical spondylosis with myelopathy; Kyphosis of cervical region; Fibromyalgia; Spondylolisthesis, lumbar region; and Primary osteoarthritis of right knee on their pertinent problem list. Pain Assessment: Severity of Chronic pain is reported as a 5 /10. Location: Back Left/down the left leg.. Onset: More than a month ago. Quality: Constant, Aching, Stabbing. Timing: Constant.  Modifying factor(s): laying down.. Vitals:  height is 5' 3"  (1.6 m) and weight is 189 lb (85.7 kg). Her temporal temperature is 97.5 F (36.4 C) (abnormal). Her blood pressure is 155/68 (abnormal) and her pulse is 91. Her respiration is 16 and oxygen saturation is 97%.   Patient presents today for medication management, of note she has upcoming spinal surgery which will be a T10 to pelvis posterior spinal fusion.  This will be a significant surgery for her.  She continues her belbuca 600 mcg twice daily.  Of instructed the patient to continue belbuca until September 20 at which point she can transition to a short acting opioid analgesic, oxycodone which we have done for her in the past when she had spinal surgery.  Pharmacotherapy Assessment   Analgesic: Currently on belbuca 600 mcg twice daily, will transition to oxycodone 10 mg every 4 hours as needed on September 20 prior to her surgery on 07/08/2020  Monitoring: [place] PMP: PDMP not reviewed this encounter.       Pharmacotherapy: No side-effects or adverse reactions reported. Compliance: No problems identified. Effectiveness: Clinically acceptable.  Chauncey Fischer, RN  06/13/2020  2:26 PM  Sign when Signing Visit Nursing Pain Medication Assessment:  Safety precautions to be maintained throughout the outpatient stay will include: orient to surroundings, keep bed in low position, maintain call bell within reach at all times, provide assistance with transfer out of bed and ambulation.  Medication Inspection Compliance: Pill count conducted under aseptic conditions, in front of the patient. Neither the pills nor the bottle was removed from the patient's sight at any time. Once count was completed pills were immediately returned to the patient in their original bottle.  Medication: Buprenorphine (Suboxone) Pill/Patch Count: 11 of 60 pills remain Pill/Patch Appearance: Markings consistent with prescribed medication Bottle Appearance: Standard  pharmacy container. Clearly labeled. Filled  Date: 8 / 20 / 75 Last Medication intake:  TodaySafety precautions to be maintained throughout the outpatient stay will include: orient to surroundings, keep bed in low position, maintain call bell within reach at all times, provide assistance with transfer out of bed and ambulation.     UDS:  Summary  Date Value Ref Range Status  05/06/2020 Note  Final    Comment:    ==================================================================== [employer] [facility] 13 (MW) ==================================================================== Test                             Result       Flag       Units  Drug Present and Declared for Prescription Verification   Noroxycodone                   95           EXPECTED   ng/mg creat    Noroxycodone is an expected metabolite of oxycodone. Sources of    oxycodone include scheduled prescription medications.    Buprenorphine                  13           EXPECTED   ng/mg creat   Norbuprenorphine               110          EXPECTED   ng/mg creat    Source of buprenorphine is a scheduled prescription medication.    Norbuprenorphine is an expected metabolite of buprenorphine.  Drug Absent but Declared for Prescription Verification   Oxycodone                      Not Detected UNEXPECTED ng/mg creat    Oxycodone is almost always present in patients taking this drug    consistently.  Absence of oxycodone could be due to lapse of time    since the last dose or unusual pharmacokinetics (rapid metabolism).  ==================================================================== Test                      Result    Flag   Units      Ref Range   Creatinine              62               mg/dL      >=20 ==================================================================== Declared Medications:  The flagging and interpretation on this report are based on the  following declared medications.  Unexpected results may arise from   inaccuracies in the declared medications.   **Note: The testing scope of this panel includes these medications:   Oxycodone (Percocet)   **Note: The testing scope of this panel does not include small to  moderate amounts of these reported medications:   Buprenorphine (Belbuca)   **Note: The testing scope of this panel does not include the  following reported medications:   Acetaminophen (Percocet)  Amlodipine  Atorvastatin  Cholecalciferol  Cyanocobalamin  Docusate  Enoxaparin (Lovenox)  Gabapentin  Lisinopril  Pantoprazole (Protonix)  Sennosides  Supplement  Venlafaxine ==================================================================== For clinical consultation, please call [phone]. ====================================================================      ROS  Constitutional: Denies any fever or chills Gastrointestinal: No reported hemesis, hematochezia, vomiting, or acute GI distress Musculoskeletal: Bilateral low back, bilateral leg pain Neurological: No reported episodes of acute onset apraxia, aphasia, dysarthria, agnosia,  amnesia, paralysis, loss of coordination, or loss of consciousness  Medication Review  Alpha-Lipoic Acid, Buprenorphine HCl, Vitamin D3, amLODipine, atorvastatin, enoxaparin, gabapentin, lisinopril, oxyCODONE-acetaminophen, pantoprazole, senna-docusate, venlafaxine XR, and vitamin B-12  History Review  Allergy: Ms. Morua is allergic to contrast media [iodinated diagnostic agents] and cephalexin. Drug: Ms. Plaia  reports no history of drug use. Alcohol:  reports current alcohol use. Tobacco:  reports that she quit smoking about 15 years ago. She has never used smokeless tobacco. Social: Ms. Oxendine  reports that she quit smoking about 15 years ago. She has never used smokeless tobacco. She reports current alcohol use. She reports that she does not use drugs. Medical:  has a past medical history of Allergy, Arthritis, Depression, GERD  (gastroesophageal reflux disease), Hyperlipidemia, Hypertension, Iron deficiency anemia (11/01/2019), Neuromuscular disorder ([place]), and Sleep apnea. Surgical: Ms. Tebbetts  has a past surgical history that includes Spine surgery; Joint replacement (Right); Tubal ligation; Brain surgery (2002); Extracorporeal shock wave lithotripsy (Right, 04/27/2019); Spinal fusion (07/10/2019); Colonoscopy with propofol (N/A, 11/10/2019); Esophagogastroduodenoscopy (egd) with propofol (N/A, 11/10/2019); polypectomy (11/10/2019); and Knee Arthroplasty (Left, 01/12/2020). Family: family history includes Depression in her brother and sister; Hyperlipidemia in her sister; Hypertension in her mother and sister.  Laboratory Chemistry Profile   Renal Lab Results  Component Value Date   BUN 26 (H) 01/02/2020   CREATININE 1.02 (H) 01/02/2020   GFRAA >60 01/02/2020   GFRNONAA 56 (L) 01/02/2020     Hepatic Lab Results  Component Value Date   AST 19 01/02/2020   ALT 13 01/02/2020   ALBUMIN 3.8 01/02/2020   ALKPHOS 95 01/02/2020     Electrolytes Lab Results  Component Value Date   NA 140 01/02/2020   K 4.4 01/02/2020   CL 102 01/02/2020   CALCIUM 9.3 01/02/2020   MG 2.1 07/21/2019   PHOS 3.4 07/21/2019     Bone No results found for: VD25OH, VD125OH2TOT, ZO1096EA5, WU9811BJ4, 25OHVITD1, 25OHVITD2, 25OHVITD3, TESTOFREE, TESTOSTERONE   Inflammation (CRP: Acute Phase) (ESR: Chronic Phase) Lab Results  Component Value Date   CRP 0.7 01/02/2020   ESRSEDRATE 43 (H) 01/02/2020   LATICACIDVEN 1.4 07/20/2019       Note: Above Lab results reviewed.  Recent Imaging Review  MR LUMBAR SPINE WO CONTRAST CLINICAL DATA:  Diffuse back pain with progression. Bilateral leg weakness.  EXAM: MRI LUMBAR SPINE WITHOUT CONTRAST  TECHNIQUE: Multiplanar, multisequence MR imaging of the lumbar spine was performed. No intravenous contrast was administered.  COMPARISON:  Lumbar spine radiographs 10/12/2019. CT abdomen  pelvis 07/20/2019  FINDINGS: Segmentation: 5 non rib-bearing lumbar type vertebral bodies are present. The lowest fully formed vertebral body is L5.  Alignment: Straightening of the normal lumbar lordosis is present following fusion L2-S1.  Vertebrae: Chronic sclerotic changes are present at L1-2. Signal is distorted by hardware L2-S1. Vertebral body heights are maintained.  Conus medullaris and cauda equina: Conus extends to the L1-2 level. Conus and cauda equina appear normal.  Paraspinal and other soft tissues: Limited imaging the abdomen is unremarkable. There is no significant adenopathy. No solid organ lesions are present.  Disc levels:  Posterior canal is decompressed fell laminectomy at L2, L3, L4, and L5. Foramina are widely patent at L2-3, L3-4, L4-5, and L5-S1.  Adjacent level disease is present at L1-2 broad-based disc protrusion and advanced facet hypertrophy resulting in moderate to severe central canal stenosis. Mild right foraminal narrowing is present.  IMPRESSION: 1. L2-S1 fusion following fusion with decompression of the central canal and foramina at  these levels. 2. Adjacent level disease at L1-2 with moderate to severe central canal stenosis and mild right foraminal narrowing. This is the most likely etiology of the patient's progressive pain and bilateral lower extremity weakness.  Electronically Signed   By: San Morelle M.D.   On: 04/24/2020 09:47 MR THORACIC SPINE WO CONTRAST CLINICAL DATA:  Diffuse back pain for 5 years with progression. Bilateral leg weakness. Multiple spinal surgeries including cervicothoracic fusion and lumbar fusion.  EXAM: MRI THORACIC SPINE WITHOUT CONTRAST  TECHNIQUE: Multiplanar, multisequence MR imaging of the thoracic spine was performed. No intravenous contrast was administered.  COMPARISON:  Spine radiographs 10/12/2019  FINDINGS: Alignment: No significant listhesis is present. There is  some straightening of the normal thoracic kyphosis. Mild rightward curvature is centered at T7-8.  Vertebrae: Vertebral body heights are maintained. Superior endplate Schmorl's node is present at T11. Chronic endplate marrow changes are noted at T7-8 and T8-9, left greater than right. Signal loss is cyst seated with posterior pedicle screws at T2 and T3 hemangioma is noted inferiorly on the left at T1.  Cord: Normal signal is present throughout the thoracic spinal cord to the conus medullaris which terminates at L1-2. Normal cord morphology is present.  Paraspinal and other soft tissues: Paraspinous soft tissues are within normal limits. The visualized lung fields are clear. 14 mm simple cyst is present at the upper pole of the right kidney.  Disc levels:  Minimal disc bulging is present at T7-8 and T8-9. No significant stenosis is present. The central canal is patent. No significant foraminal narrowing is present in the thoracic spine.  IMPRESSION: 1. Minimal disc bulging at T7-8 and T8-9 without significant central canal or foraminal stenosis. 2. Posterior fusion hardware at T2 and T3. 3. No acute abnormality or focal lesion in the thoracic spine to explain the patient's symptoms.  Electronically Signed   By: San Morelle M.D.   On: 04/24/2020 09:42 Note: Reviewed        Physical Exam  General appearance: Well nourished, well developed, and well hydrated. In no apparent acute distress Mental status: Alert, oriented x 3 (person, place, & time)       Respiratory: No evidence of acute respiratory distress Eyes: PERLA Vitals: BP (!) 155/68 (BP Location: Left Arm, Patient Position: Sitting, Cuff Size: Normal)   Pulse 91   Temp (!) 97.5 F (36.4 C) (Temporal)   Resp 16   Ht 5' 3"  (1.6 m)   Wt 189 lb (85.7 kg)   SpO2 97%   BMI 33.48 kg/m  BMI: Estimated body mass index is 33.48 kg/m as calculated from the following:   Height as of this encounter: 5' 3"  (1.6  m).   Weight as of this encounter: 189 lb (85.7 kg). Ideal: Ideal body weight: 52.4 kg (115 lb 8.3 oz) Adjusted ideal body weight: 65.7 kg (144 lb 14.6 oz)  Cervical Spine Area Exam  Skin & Axial Inspection: Well healed scar from previous spine surgery detected Alignment: Asymmetric Functional ROM: Pain restricted ROM, bilaterally Stability: No instability detected Muscle Tone/Strength: Guarding detected Sensory (Neurological): Dermatomal pain pattern    Lumbar Spine Area Exam  Skin & Axial Inspection: Well healed scar from previous spine surgery detected Alignment: Asymmetric Functional ROM: Decreased ROM affecting both sides Stability: No instability detected Muscle Tone/Strength: Functionally intact. No obvious neuro-muscular anomalies detected. Sensory (Neurological): Dermatomal pain pattern   Gait & Posture Assessment  Ambulation: Limited Gait: Antalgic Posture: Difficulty standing up straight, due to pain  Lower Extremity Exam    Side: Right lower extremity  Side: Left lower extremity  Stability: No instability observed          Stability: No instability observed          Skin & Extremity Inspection: Skin color, temperature, and hair growth are WNL. No peripheral edema or cyanosis. No masses, redness, swelling, asymmetry, or associated skin lesions. No contractures.  Skin & Extremity Inspection: Skin color, temperature, and hair growth are WNL. No peripheral edema or cyanosis. No masses, redness, swelling, asymmetry, or associated skin lesions. No contractures.  Functional ROM: Decreased ROM                  Functional ROM: Decreased ROM for all joints of the lower extremity          Muscle Tone/Strength: Functionally intact. No obvious neuro-muscular anomalies detected.  Muscle Tone/Strength: Functionally intact. No obvious neuro-muscular anomalies detected.  Sensory (Neurological): Unimpaired        Sensory (Neurological): Dermatomal pain pattern         DTR: Patellar: 0: absent Achilles: deferred today Plantar: deferred today  DTR: Patellar: 0: absent Achilles: deferred today Plantar: deferred today  Palpation: No palpable anomalies  Palpation: No palpable anomalies     Assessment   Status Diagnosis  Persistent Persistent Persistent 1. Chronic radicular lumbar pain   2. Spinal stenosis of lumbar region with neurogenic claudication   3. Postlaminectomy syndrome of lumbosacral region   4. History of fusion of lumbar spine   5. Primary osteoarthritis of left knee   6. Primary osteoarthritis of right shoulder   7. Hx of cervical spine surgery   8. Foraminal stenosis of cervical region   9. Chronic pain syndrome      Updated Problems: Problem  Chronic Radicular Lumbar Pain  Primary Osteoarthritis of Left Knee  Primary Osteoarthritis of Right Shoulder  Hx of Cervical Spine Surgery  Kyphosis of Cervical Region  Cervical Spondylosis With Myelopathy  Cervical Spondylosis Without Myelopathy  Foraminal Stenosis of Cervical Region  Cervical Stenosis of Spinal Canal   Moderate, C3-C4, C5-C6, C6-C7   History of Fusion of Lumbar Spine   L2-S1   Spinal Stenosis of Lumbar Region With Neurogenic Claudication   L1-L2, severe with associated mass-effect on conus and cauda equina nerve roots   Postlaminectomy Syndrome of Lumbosacral Region  Chronic Pain Syndrome  Primary Osteoarthritis of Right Knee  Spondylolisthesis, Lumbar Region  Fibromyalgia   Fibromyalgia Fibromyalgia     Plan of Care   Continue belbuca 600 mcg twice daily until 06/24/2020.  Then transition to oxycodone as below prior to her extensive spinal surgery scheduled with Dr. Cari Caraway on 07/08/2020.  Patient instructed to discuss intraoperative ketamine infusion for perioperative pain management with anesthesia care team.  Continue multimodal analgesics with gabapentin and alpha lipoic acid as prescribed.  Pharmacotherapy (Medications Ordered): Meds ordered  this encounter  Medications  . oxyCODONE-acetaminophen (PERCOCET) 10-325 MG tablet    Sig: Take 1 tablet by mouth every 4 (four) hours as needed for pain. Max 5 tablets/day. Must last 30 days.    Dispense:  150 tablet    Refill:  0    Chronic Pain. (STOP Act - Not applicable). Fill one day early if closed on scheduled refill date.  Marland Kitchen oxyCODONE-acetaminophen (PERCOCET) 10-325 MG tablet    Sig: Take 1 tablet by mouth every 4 (four) hours as needed for pain. Max 5 tablets/day. Must last 30 days.    Dispense:  150 tablet    Refill:  0    Chronic Pain. (STOP Act - Not applicable). Fill one day early if closed on scheduled refill date.  . Buprenorphine HCl (BELBUCA) 600 MCG FILM    Sig: Place 600 mcg inside cheek every 12 (twelve) hours for 6 days.    Dispense:  12 each    Refill:  0    Follow-up plan:   Return in about 2 months (around 08/19/2020) for Medication Management, in person.   Recent Visits Date Type Provider Dept  05/06/20 Office Visit Gillis Santa, MD [facility]  Showing recent visits within past 90 days and meeting all other requirements Today's Visits Date Type Provider Dept  06/13/20 Office Visit Gillis Santa, MD [facility]  Showing today's visits and meeting all other requirements Future Appointments Date Type Provider Dept  08/13/20 Appointment Gillis Santa, MD [facility]  Showing future appointments within next 90 days and meeting all other requirements  I discussed the assessment and treatment plan with the patient. The patient was provided an opportunity to ask questions and all were answered. The patient agreed with the plan and demonstrated an understanding of the instructions.  Patient advised to call back or seek an in-person evaluation if the symptoms or condition worsens.  Duration of encounter: 30 minutes.  Note by: Gillis Santa, MD Date: 06/13/2020; Time: 3:08 PM

## 2020-06-14 DIAGNOSIS — R002 Palpitations: Secondary | ICD-10-CM | POA: Insufficient documentation

## 2020-06-14 DIAGNOSIS — N3941 Urge incontinence: Secondary | ICD-10-CM | POA: Insufficient documentation

## 2020-06-19 ENCOUNTER — Other Ambulatory Visit: Payer: Self-pay | Admitting: Student in an Organized Health Care Education/Training Program

## 2020-06-19 ENCOUNTER — Other Ambulatory Visit: Payer: Self-pay | Admitting: Oncology

## 2020-07-05 ENCOUNTER — Other Ambulatory Visit
Admission: RE | Admit: 2020-07-05 | Discharge: 2020-07-05 | Disposition: A | Discharge status: Home / Self Care | Payer: Medicare Other | Source: Ambulatory Visit | Attending: Neurosurgery | Admitting: Neurosurgery

## 2020-07-05 DIAGNOSIS — Z01812 Encounter for preprocedural laboratory examination: Secondary | ICD-10-CM | POA: Insufficient documentation

## 2020-07-05 DIAGNOSIS — Z20822 Contact with and (suspected) exposure to covid-19: Secondary | ICD-10-CM | POA: Diagnosis not present

## 2020-07-06 LAB — SARS CORONAVIRUS 2 (TAT 6-24 HRS): SARS Coronavirus 2: NEGATIVE

## 2020-07-08 DIAGNOSIS — M51379 Other intervertebral disc degeneration, lumbosacral region without mention of lumbar back pain or lower extremity pain: Secondary | ICD-10-CM | POA: Insufficient documentation

## 2020-07-22 ENCOUNTER — Other Ambulatory Visit: Payer: Self-pay | Admitting: Oncology

## 2020-08-08 ENCOUNTER — Telehealth: Payer: Self-pay

## 2020-08-08 NOTE — Telephone Encounter (Signed)
Dr. Carrie Mew wants to discuss this mutual patient with you. Please call her back at [phone]

## 2020-08-13 ENCOUNTER — Other Ambulatory Visit: Payer: Self-pay

## 2020-08-13 ENCOUNTER — Encounter: Payer: Self-pay | Admitting: Student in an Organized Health Care Education/Training Program

## 2020-08-13 ENCOUNTER — Other Ambulatory Visit: Payer: Self-pay | Admitting: Oncology

## 2020-08-13 ENCOUNTER — Ambulatory Visit
Payer: Medicare Other | Attending: Student in an Organized Health Care Education/Training Program | Admitting: Student in an Organized Health Care Education/Training Program

## 2020-08-13 VITALS — BP 131/66 | HR 91 | Temp 97.0°F | Resp 16 | Ht 64.0 in | Wt 185.0 lb

## 2020-08-13 DIAGNOSIS — Z981 Arthrodesis status: Secondary | ICD-10-CM | POA: Diagnosis not present

## 2020-08-13 DIAGNOSIS — M48062 Spinal stenosis, lumbar region with neurogenic claudication: Secondary | ICD-10-CM | POA: Insufficient documentation

## 2020-08-13 DIAGNOSIS — G894 Chronic pain syndrome: Secondary | ICD-10-CM | POA: Insufficient documentation

## 2020-08-13 DIAGNOSIS — M1712 Unilateral primary osteoarthritis, left knee: Secondary | ICD-10-CM | POA: Diagnosis present

## 2020-08-13 DIAGNOSIS — M4802 Spinal stenosis, cervical region: Secondary | ICD-10-CM | POA: Insufficient documentation

## 2020-08-13 DIAGNOSIS — M961 Postlaminectomy syndrome, not elsewhere classified: Secondary | ICD-10-CM | POA: Insufficient documentation

## 2020-08-13 DIAGNOSIS — M19011 Primary osteoarthritis, right shoulder: Secondary | ICD-10-CM | POA: Diagnosis present

## 2020-08-13 DIAGNOSIS — G8929 Other chronic pain: Secondary | ICD-10-CM | POA: Insufficient documentation

## 2020-08-13 DIAGNOSIS — M5416 Radiculopathy, lumbar region: Secondary | ICD-10-CM | POA: Insufficient documentation

## 2020-08-13 DIAGNOSIS — Z9889 Other specified postprocedural states: Secondary | ICD-10-CM

## 2020-08-13 MED ORDER — BELBUCA 750 MCG BU FILM
1.0000 | ORAL_FILM | Freq: Two times a day (BID) | BUCCAL | 1 refills | Status: AC
Start: 2020-08-13 — End: 2020-09-12

## 2020-08-13 MED ORDER — BELBUCA 750 MCG BU FILM
1.0000 | ORAL_FILM | Freq: Two times a day (BID) | BUCCAL | 0 refills | Status: DC
Start: 2020-09-12 — End: 2020-08-13

## 2020-08-13 MED ORDER — BELBUCA 750 MCG BU FILM
1.0000 | ORAL_FILM | Freq: Two times a day (BID) | BUCCAL | 0 refills | Status: DC
Start: 2020-08-13 — End: 2020-08-13

## 2020-08-13 NOTE — Progress Notes (Signed)
Nursing Pain Medication Assessment:  Safety precautions to be maintained throughout the outpatient stay will include: orient to surroundings, keep bed in low position, maintain call bell within reach at all times, provide assistance with transfer out of bed and ambulation.  Medication Inspection Compliance: Lisa Good did not comply with our request to bring her pills to be counted. She was reminded that bringing the medication bottles, even when empty, is a requirement.  Medication: None brought in. Pill/Patch Count: None available to be counted. Bottle Appearance: No container available. Did not bring bottle(s) to appointment. Filled Date: N/A Last Medication intake:  last week

## 2020-08-13 NOTE — Progress Notes (Signed)
PROVIDER NOTE: Information contained herein reflects review and annotations entered in association with encounter. Interpretation of such information and data should be left to medically-trained personnel. Information provided to patient can be located elsewhere in the medical record under "Patient Instructions". Document created using STT-dictation technology, any transcriptional errors that may result from process are unintentional.    Patient: Lisa Good  Service Category: E/M  Provider: Gillis Santa, MD  DOB: [birth year]  DOS: 08/13/2020  Specialty: Interventional Pain Management  MRN: [id number]  Setting: Ambulatory outpatient  PCP: [name], MD  Type: Established Patient    Referring Provider: [name], MD  Location: Office  Delivery: Face-to-face     HPI  Lisa Good, a 69 y.o. year old female, is here today because of her Chronic radicular lumbar pain [M54.16, G89.29]. Lisa Good primary complain today is Back Pain and Abdominal Pain Last encounter: My last encounter with her was on 06/19/2020. Pertinent problems: Lisa Good has Cervical spondylosis without myelopathy; Foraminal stenosis of cervical region; Cervical stenosis of spinal canal; History of fusion of lumbar spine; Spinal stenosis of lumbar region with neurogenic claudication; Postlaminectomy syndrome of lumbosacral region; Chronic pain syndrome; Hx of cervical spine surgery; Primary osteoarthritis of left knee; Primary osteoarthritis of right shoulder; Chronic radicular lumbar pain; Cervical spondylosis with myelopathy; Kyphosis of cervical region; Fibromyalgia; Spondylolisthesis, lumbar region; and Primary osteoarthritis of right knee on their pertinent problem list. Pain Assessment: Severity of Chronic pain is reported as a 7 /10. Location: Back Lower/denies. Onset: More than a month ago. Quality: Aching, Stabbing, Sore. Timing: Constant. Modifying factor(s): rest. Vitals:  height is 5' 4"  (1.626 m) and  weight is 185 lb (83.9 kg). Her temperature is 97 F (36.1 C) (abnormal). Her blood pressure is 131/66 and her pulse is 91. Her respiration is 16 and oxygen saturation is 98%.   Reason for encounter: medication management.   Patient presents today for medication management.  She is status post T10 to pelvis posterior spinal fusion, L1-2 XLIF and L1-2 Smith-Petersen osteotomy with Dr. Cari Caraway that was done on 07/08/2020.  She has been recovering from her surgical procedure.  She has been dealing with significant opioid-induced constipation.  She has tried stool softeners, milk of magnesia, MiraLAX.  She states that she goes 4 to 5 days without having a bowel movement.  We discussed effective strategies to hopefully help with opioid-induced constipation that could include more frequent use of MiraLAX and/or milk of magnesia if she goes without a bowel movement more than 2 days.  At this point, the majority of her acute postoperative pain seems to have resolved.  Her surgical procedure was on 07/08/2020.  We discussed transitioning back to belbuca which she was on before.  This should hopefully help out with constipation.  We will increase dose to 750 mcg twice a day as she continues to recover from her significant spine surgery.  I instructed the patient that we will likely wean back down to 600 mcg twice a day when she follows up at the end of December.  Patient endorsed understanding.  Temporary increase in belbuca for 2 months.  Pharmacotherapy Assessment   Analgesic: Transition back to belbuca however at a higher dose of 750 mcg BID for 2 months and then will reduce back to previous dose of 600 mcg BID  Monitoring: [place] PMP: PDMP not reviewed this encounter.       Pharmacotherapy: No side-effects or adverse reactions reported. Compliance: No problems identified. Effectiveness: Clinically acceptable.  Dewayne Shorter, RN  08/13/2020  1:45 PM  Sign when Signing Visit Nursing Pain Medication Assessment:   Safety precautions to be maintained throughout the outpatient stay will include: orient to surroundings, keep bed in low position, maintain call bell within reach at all times, provide assistance with transfer out of bed and ambulation.  Medication Inspection Compliance: Lisa Good did not comply with our request to bring her pills to be counted. She was reminded that bringing the medication bottles, even when empty, is a requirement.  Medication: None brought in. Pill/Patch Count: None available to be counted. Bottle Appearance: No container available. Did not bring bottle(s) to appointment. Filled Date: N/A Last Medication intake:  last week    UDS:  Summary  Date Value Ref Range Status  05/06/2020 Note  Final    Comment:    ==================================================================== [employer] [facility] 13 (MW) ==================================================================== Test                             Result       Flag       Units  Drug Present and Declared for Prescription Verification   Noroxycodone                   95           EXPECTED   ng/mg creat    Noroxycodone is an expected metabolite of oxycodone. Sources of    oxycodone include scheduled prescription medications.    Buprenorphine                  13           EXPECTED   ng/mg creat   Norbuprenorphine               110          EXPECTED   ng/mg creat    Source of buprenorphine is a scheduled prescription medication.    Norbuprenorphine is an expected metabolite of buprenorphine.  Drug Absent but Declared for Prescription Verification   Oxycodone                      Not Detected UNEXPECTED ng/mg creat    Oxycodone is almost always present in patients taking this drug    consistently.  Absence of oxycodone could be due to lapse of time    since the last dose or unusual pharmacokinetics (rapid metabolism).  ==================================================================== Test                       Result    Flag   Units      Ref Range   Creatinine              62               mg/dL      >=20 ==================================================================== Declared Medications:  The flagging and interpretation on this report are based on the  following declared medications.  Unexpected results may arise from  inaccuracies in the declared medications.   **Note: The testing scope of this panel includes these medications:   Oxycodone (Percocet)   **Note: The testing scope of this panel does not include small to  moderate amounts of these reported medications:   Buprenorphine (Belbuca)   **Note: The testing scope of this panel does not include the  following reported medications:   Acetaminophen (Percocet)  Amlodipine  Atorvastatin  Cholecalciferol  Cyanocobalamin  Docusate  Enoxaparin (Lovenox)  Gabapentin  Lisinopril  Pantoprazole (Protonix)  Sennosides  Supplement  Venlafaxine ==================================================================== For clinical consultation, please call [phone]. ====================================================================      ROS  Constitutional: Denies any fever or chills Gastrointestinal: No reported hemesis, hematochezia, vomiting, or acute GI distress Musculoskeletal: Denies any acute onset joint swelling, redness, loss of ROM, or weakness Neurological: No reported episodes of acute onset apraxia, aphasia, dysarthria, agnosia, amnesia, paralysis, loss of coordination, or loss of consciousness  Medication Review  Alpha-Lipoic Acid, Buprenorphine HCl, Vitamin D3, amLODipine, atorvastatin, enoxaparin, gabapentin, lisinopril, oxyCODONE-acetaminophen, pantoprazole, senna-docusate, venlafaxine XR, and vitamin B-12  History Review  Allergy: Lisa Good is allergic to contrast media [iodinated diagnostic agents] and cephalexin. Drug: Lisa Good  reports no history of drug use. Alcohol:  reports current alcohol  use. Tobacco:  reports that she quit smoking about 15 years ago. She has never used smokeless tobacco. Social: Lisa Good  reports that she quit smoking about 15 years ago. She has never used smokeless tobacco. She reports current alcohol use. She reports that she does not use drugs. Medical:  has a past medical history of Allergy, Arthritis, Depression, GERD (gastroesophageal reflux disease), Hyperlipidemia, Hypertension, Iron deficiency anemia (11/01/2019), Neuromuscular disorder ([place]), and Sleep apnea. Surgical: Lisa Good  has a past surgical history that includes Spine surgery; Joint replacement (Right); Tubal ligation; Brain surgery (2002); Extracorporeal shock wave lithotripsy (Right, 04/27/2019); Spinal fusion (07/10/2019); Colonoscopy with propofol (N/A, 11/10/2019); Esophagogastroduodenoscopy (egd) with propofol (N/A, 11/10/2019); polypectomy (11/10/2019); and Knee Arthroplasty (Left, 01/12/2020). Family: family history includes Depression in her brother and sister; Hyperlipidemia in her sister; Hypertension in her mother and sister.  Laboratory Chemistry Profile   Renal Lab Results  Component Value Date   BUN 26 (H) 01/02/2020   CREATININE 1.02 (H) 01/02/2020   GFRAA >60 01/02/2020   GFRNONAA 56 (L) 01/02/2020     Hepatic Lab Results  Component Value Date   AST 19 01/02/2020   ALT 13 01/02/2020   ALBUMIN 3.8 01/02/2020   ALKPHOS 95 01/02/2020     Electrolytes Lab Results  Component Value Date   NA 140 01/02/2020   K 4.4 01/02/2020   CL 102 01/02/2020   CALCIUM 9.3 01/02/2020   MG 2.1 07/21/2019   PHOS 3.4 07/21/2019     Bone No results found for: VD25OH, VD125OH2TOT, LK5625WL8, LH7342AJ6, 25OHVITD1, 25OHVITD2, 25OHVITD3, TESTOFREE, TESTOSTERONE   Inflammation (CRP: Acute Phase) (ESR: Chronic Phase) Lab Results  Component Value Date   CRP 0.7 01/02/2020   ESRSEDRATE 43 (H) 01/02/2020   LATICACIDVEN 1.4 07/20/2019       Note: Above Lab results reviewed.  Recent Imaging  Review  MR LUMBAR SPINE WO CONTRAST CLINICAL DATA:  Diffuse back pain with progression. Bilateral leg weakness.  EXAM: MRI LUMBAR SPINE WITHOUT CONTRAST  TECHNIQUE: Multiplanar, multisequence MR imaging of the lumbar spine was performed. No intravenous contrast was administered.  COMPARISON:  Lumbar spine radiographs 10/12/2019. CT abdomen pelvis 07/20/2019  FINDINGS: Segmentation: 5 non rib-bearing lumbar type vertebral bodies are present. The lowest fully formed vertebral body is L5.  Alignment: Straightening of the normal lumbar lordosis is present following fusion L2-S1.  Vertebrae: Chronic sclerotic changes are present at L1-2. Signal is distorted by hardware L2-S1. Vertebral body heights are maintained.  Conus medullaris and cauda equina: Conus extends to the L1-2 level. Conus and cauda equina appear normal.  Paraspinal and other soft tissues: Limited imaging the abdomen is unremarkable. There is no  significant adenopathy. No solid organ lesions are present.  Disc levels:  Posterior canal is decompressed fell laminectomy at L2, L3, L4, and L5. Foramina are widely patent at L2-3, L3-4, L4-5, and L5-S1.  Adjacent level disease is present at L1-2 broad-based disc protrusion and advanced facet hypertrophy resulting in moderate to severe central canal stenosis. Mild right foraminal narrowing is present.  IMPRESSION: 1. L2-S1 fusion following fusion with decompression of the central canal and foramina at these levels. 2. Adjacent level disease at L1-2 with moderate to severe central canal stenosis and mild right foraminal narrowing. This is the most likely etiology of the patient's progressive pain and bilateral lower extremity weakness.  Electronically Signed   By: San Morelle M.D.   On: 04/24/2020 09:47 MR THORACIC SPINE WO CONTRAST CLINICAL DATA:  Diffuse back pain for 5 years with progression. Bilateral leg weakness. Multiple spinal surgeries  including cervicothoracic fusion and lumbar fusion.  EXAM: MRI THORACIC SPINE WITHOUT CONTRAST  TECHNIQUE: Multiplanar, multisequence MR imaging of the thoracic spine was performed. No intravenous contrast was administered.  COMPARISON:  Spine radiographs 10/12/2019  FINDINGS: Alignment: No significant listhesis is present. There is some straightening of the normal thoracic kyphosis. Mild rightward curvature is centered at T7-8.  Vertebrae: Vertebral body heights are maintained. Superior endplate Schmorl's node is present at T11. Chronic endplate marrow changes are noted at T7-8 and T8-9, left greater than right. Signal loss is cyst seated with posterior pedicle screws at T2 and T3 hemangioma is noted inferiorly on the left at T1.  Cord: Normal signal is present throughout the thoracic spinal cord to the conus medullaris which terminates at L1-2. Normal cord morphology is present.  Paraspinal and other soft tissues: Paraspinous soft tissues are within normal limits. The visualized lung fields are clear. 14 mm simple cyst is present at the upper pole of the right kidney.  Disc levels:  Minimal disc bulging is present at T7-8 and T8-9. No significant stenosis is present. The central canal is patent. No significant foraminal narrowing is present in the thoracic spine.  IMPRESSION: 1. Minimal disc bulging at T7-8 and T8-9 without significant central canal or foraminal stenosis. 2. Posterior fusion hardware at T2 and T3. 3. No acute abnormality or focal lesion in the thoracic spine to explain the patient's symptoms.  Electronically Signed   By: San Morelle M.D.   On: 04/24/2020 09:42 Note: Reviewed        Physical Exam  General appearance: Well nourished, well developed, and well hydrated. In no apparent acute distress Mental status: Alert, oriented x 3 (person, place, & time)       Respiratory: No evidence of acute respiratory distress Eyes: PERLA Vitals: BP  131/66   Pulse 91   Temp (!) 97 F (36.1 C)   Resp 16   Ht 5' 4"  (1.626 m)   Wt 185 lb (83.9 kg)   SpO2 98%   BMI 31.76 kg/m  BMI: Estimated body mass index is 31.76 kg/m as calculated from the following:   Height as of this encounter: 5' 4"  (1.626 m).   Weight as of this encounter: 185 lb (83.9 kg). Ideal: Ideal body weight: 54.7 kg (120 lb 9.5 oz) Adjusted ideal body weight: 66.4 kg (146 lb 5.7 oz)   Thoracic Spine Area Exam  Skin & Axial Inspection: Well healed scar from previous spine surgery detected Alignment: Symmetrical Functional ROM: Pain restricted ROM Stability: No instability detected Muscle Tone/Strength: Functionally intact. No obvious neuro-muscular anomalies detected. Sensory (  Neurological): Dermatomal pain pattern Muscle strength & Tone: No palpable anomalies   Thoracic brace present Lumbar Spine Area Exam  Skin & Axial Inspection: No masses, redness, or swelling Alignment: Symmetrical Functional ROM: Pain restricted ROM affecting both sides Stability: No instability detected Muscle Tone/Strength: Functionally intact. No obvious neuro-muscular anomalies detected. Sensory (Neurological): Dermatomal pain pattern  *(Flexion, ABduction and External Rotation) Lower Extremity Exam    Side: Right lower extremity  Side: Left lower extremity  Stability: No instability observed          Stability: No instability observed          Skin & Extremity Inspection: Skin color, temperature, and hair growth are WNL. No peripheral edema or cyanosis. No masses, redness, swelling, asymmetry, or associated skin lesions. No contractures.  Skin & Extremity Inspection: Skin color, temperature, and hair growth are WNL. No peripheral edema or cyanosis. No masses, redness, swelling, asymmetry, or associated skin lesions. No contractures.  Functional ROM: Pain restricted ROM for hip and knee joints          Functional ROM: Pain restricted ROM for hip and knee joints          Muscle  Tone/Strength: Functionally intact. No obvious neuro-muscular anomalies detected.  Muscle Tone/Strength: Functionally intact. No obvious neuro-muscular anomalies detected.  Sensory (Neurological): Neurogenic pain pattern        Sensory (Neurological): Neurogenic pain pattern        DTR: Patellar: deferred today Achilles: deferred today Plantar: deferred today  DTR: Patellar: deferred today Achilles: deferred today Plantar: deferred today  Palpation: No palpable anomalies  Palpation: No palpable anomalies    Assessment   Status Diagnosis  Controlled Controlled Controlled 1. Chronic radicular lumbar pain   2. Spinal stenosis of lumbar region with neurogenic claudication   3. Postlaminectomy syndrome of lumbosacral region   4. History of fusion of lumbar spine   5. Primary osteoarthritis of left knee   6. Primary osteoarthritis of right shoulder   7. Hx of cervical spine surgery   8. Foraminal stenosis of cervical region   9. Chronic pain syndrome       Plan of Care   Lisa Good has a current medication list which includes the following long-term medication(s): amlodipine, atorvastatin, enoxaparin, gabapentin, lisinopril, and pantoprazole.  Pharmacotherapy (Medications Ordered): Meds ordered this encounter  Medications  . DISCONTD: Buprenorphine HCl (BELBUCA) 750 MCG FILM    Sig: Place 1 Film inside cheek 2 (two) times daily.    Dispense:  60 each    Refill:  0  . DISCONTD: Buprenorphine HCl (BELBUCA) 750 MCG FILM    Sig: Place 1 Film inside cheek 2 (two) times daily.    Dispense:  60 each    Refill:  0  . Buprenorphine HCl (BELBUCA) 750 MCG FILM    Sig: Place 1 Film inside cheek 2 (two) times daily.    Dispense:  60 each    Refill:  1   Follow-up plan:   Return in about 6 weeks (around 09/26/2020) for Medication Management, in person.   Recent Visits Date Type Provider Dept  06/13/20 Office Visit Gillis Santa, MD [facility]  Showing recent visits  within past 90 days and meeting all other requirements Today's Visits Date Type Provider Dept  08/13/20 Office Visit Gillis Santa, MD [facility]  Showing today's visits and meeting all other requirements Future Appointments Date Type Provider Dept  09/25/20 Appointment Gillis Santa, MD [facility]  Showing future  appointments within next 90 days and meeting all other requirements  I discussed the assessment and treatment plan with the patient. The patient was provided an opportunity to ask questions and all were answered. The patient agreed with the plan and demonstrated an understanding of the instructions.  Patient advised to call back or seek an in-person evaluation if the symptoms or condition worsens.  Duration of encounter: 30 minutes.  Note by: Gillis Santa, MD Date: 08/13/2020; Time: 2:51 PM

## 2020-08-13 NOTE — Telephone Encounter (Signed)
Patient called to cancel appts in May and never called back to reschedule.

## 2020-09-25 ENCOUNTER — Other Ambulatory Visit: Payer: Self-pay

## 2020-09-25 ENCOUNTER — Encounter: Payer: Self-pay | Admitting: Student in an Organized Health Care Education/Training Program

## 2020-09-25 ENCOUNTER — Ambulatory Visit
Payer: Medicare Other | Attending: Student in an Organized Health Care Education/Training Program | Admitting: Student in an Organized Health Care Education/Training Program

## 2020-09-25 VITALS — BP 146/70 | HR 100 | Temp 98.2°F | Resp 18 | Ht 64.0 in | Wt 195.0 lb

## 2020-09-25 DIAGNOSIS — G8929 Other chronic pain: Secondary | ICD-10-CM | POA: Diagnosis present

## 2020-09-25 DIAGNOSIS — M1712 Unilateral primary osteoarthritis, left knee: Secondary | ICD-10-CM | POA: Insufficient documentation

## 2020-09-25 DIAGNOSIS — M19011 Primary osteoarthritis, right shoulder: Secondary | ICD-10-CM | POA: Diagnosis present

## 2020-09-25 DIAGNOSIS — M48062 Spinal stenosis, lumbar region with neurogenic claudication: Secondary | ICD-10-CM

## 2020-09-25 DIAGNOSIS — M5416 Radiculopathy, lumbar region: Secondary | ICD-10-CM

## 2020-09-25 DIAGNOSIS — M961 Postlaminectomy syndrome, not elsewhere classified: Secondary | ICD-10-CM | POA: Insufficient documentation

## 2020-09-25 DIAGNOSIS — G894 Chronic pain syndrome: Secondary | ICD-10-CM | POA: Insufficient documentation

## 2020-09-25 DIAGNOSIS — Z981 Arthrodesis status: Secondary | ICD-10-CM | POA: Insufficient documentation

## 2020-09-25 DIAGNOSIS — M4802 Spinal stenosis, cervical region: Secondary | ICD-10-CM | POA: Insufficient documentation

## 2020-09-25 MED ORDER — KETOROLAC TROMETHAMINE 30 MG/ML IJ SOLN
30.0000 mg | Freq: Once | INTRAMUSCULAR | Status: AC
  Administered 2020-09-25: 30 mg via INTRAMUSCULAR
  Filled 2020-09-25: qty 1

## 2020-09-25 MED ORDER — BELBUCA 600 MCG BU FILM: 1.0000 | ORAL_FILM | Freq: Two times a day (BID) | BUCCAL | 2 refills | Status: DC

## 2020-09-25 MED ORDER — ORPHENADRINE CITRATE 30 MG/ML IJ SOLN
30.0000 mg | Freq: Once | INTRAMUSCULAR | Status: AC
  Administered 2020-09-25: 30 mg via INTRAMUSCULAR
  Filled 2020-09-25: qty 2

## 2020-09-25 NOTE — Progress Notes (Signed)
Nursing Pain Medication Assessment:  Safety precautions to be maintained throughout the outpatient stay will include: orient to surroundings, keep bed in low position, maintain call bell within reach at all times, provide assistance with transfer out of bed and ambulation.  Medication Inspection Compliance: Lisa Good did not comply with our request to bring her pills to be counted. She was reminded that bringing the medication bottles, even when empty, is a requirement.  Medication: None brought in. Pill/Patch Count: None available to be counted. Bottle Appearance: No container available. Did not bring bottle(s) to appointment. Filled Date: N/A Last Medication intake:  Today

## 2020-09-25 NOTE — Progress Notes (Signed)
PROVIDER NOTE: Information contained herein reflects review and annotations entered in association with encounter. Interpretation of such information and data should be left to medically-trained personnel. Information provided to patient can be located elsewhere in the medical record under "Patient Instructions". Document created using STT-dictation technology, any transcriptional errors that may result from process are unintentional.    Patient: Lisa Good  Service Category: E/M  Provider: Gillis Santa, MD  DOB: [birth year]  DOS: 09/25/2020  Specialty: Interventional Pain Management  MRN: [id number]  Setting: Ambulatory outpatient  PCP: [name], MD  Type: Established Patient    Referring Provider: [name], MD  Location: Office  Delivery: Face-to-face     HPI  [name], a 69 y.o. year old female, is here today because of her Chronic radicular lumbar pain [M54.16, G89.29]. [name] primary complain today is Back Pain (low) and Shoulder Pain (right) Last encounter: My last encounter with her was on 08/13/2020. Pertinent problems: [name] has Cervical spondylosis without myelopathy; Foraminal stenosis of cervical region; Cervical stenosis of spinal canal; History of fusion of lumbar spine; Spinal stenosis of lumbar region with neurogenic claudication; Postlaminectomy syndrome of lumbosacral region; Chronic pain syndrome; Hx of cervical spine surgery; Primary osteoarthritis of left knee; Primary osteoarthritis of right shoulder; Chronic radicular lumbar pain; Cervical spondylosis with myelopathy; Kyphosis of cervical region; Fibromyalgia; Spondylolisthesis, lumbar region; and Primary osteoarthritis of right knee on their pertinent problem list. Pain Assessment: Severity of Chronic pain is reported as a 5 /10. Location: Back Lower/ . Onset: More than a month ago. Quality: Constant,Aching. Timing: Constant. Modifying factor(s): rest, heat, topicals. Vitals:  height is  5' 4"  (1.626 m) and weight is 195 lb (88.5 kg). Her temporal temperature is 98.2 F (36.8 C). Her blood pressure is 146/70 (abnormal) and her pulse is 100. Her respiration is 18 and oxygen saturation is 97%.   Reason for encounter: medication management.   No change in medical history since last visit.  She is having increased right shoulder pain related to glenohumeral arthropathy.  She also states that she is continuing to heal from her spine surgery.  Is not obtaining any additional benefits with a higher belbuca dose at 750 mcg twice daily so we will reduce back to 600 mcg twice daily.  Otherwise, patient continues multimodal pain regimen as prescribed.  States that it provides pain relief and improvement in functional status.  Patient has been having issues with constipation (improved from last visit) and I encouraged her to take a daily stool softener and that if she goes more than 2 days without having a bowel movement to take MiraLAX.  If this is not effective can consider Movantik or another prescription drug for opioid-induced constipation.   Pharmacotherapy Assessment   Analgesic: Reduce belbuca back to 600 mcg twice daily.  Monitoring: [place] PMP: PDMP reviewed during this encounter.       Pharmacotherapy: No side-effects or adverse reactions reported. Compliance: No problems identified. Effectiveness: Clinically acceptable.  Hart Rochester, RN  09/25/2020  1:49 PM  Sign when Signing Visit Nursing Pain Medication Assessment:  Safety precautions to be maintained throughout the outpatient stay will include: orient to surroundings, keep bed in low position, maintain call bell within reach at all times, provide assistance with transfer out of bed and ambulation.  Medication Inspection Compliance: [name] did not comply with our request to bring her pills to be counted. She was reminded that bringing the medication bottles, even when empty, is a  requirement.  Medication: None  brought in. Pill/Patch Count: None available to be counted. Bottle Appearance: No container available. Did not bring bottle(s) to appointment. Filled Date: N/A Last Medication intake:  Today    UDS:  Summary  Date Value Ref Range Status  05/06/2020 Note  Final    Comment:    ==================================================================== [employer] [facility] 13 (MW) ==================================================================== Test                             Result       Flag       Units  Drug Present and Declared for Prescription Verification   Noroxycodone                   95           EXPECTED   ng/mg creat    Noroxycodone is an expected metabolite of oxycodone. Sources of    oxycodone include scheduled prescription medications.    Buprenorphine                  13           EXPECTED   ng/mg creat   Norbuprenorphine               110          EXPECTED   ng/mg creat    Source of buprenorphine is a scheduled prescription medication.    Norbuprenorphine is an expected metabolite of buprenorphine.  Drug Absent but Declared for Prescription Verification   Oxycodone                      Not Detected UNEXPECTED ng/mg creat    Oxycodone is almost always present in patients taking this drug    consistently.  Absence of oxycodone could be due to lapse of time    since the last dose or unusual pharmacokinetics (rapid metabolism).  ==================================================================== Test                      Result    Flag   Units      Ref Range   Creatinine              62               mg/dL      >=20 ==================================================================== Declared Medications:  The flagging and interpretation on this report are based on the  following declared medications.  Unexpected results may arise from  inaccuracies in the declared medications.   **Note: The testing scope of this panel includes these medications:   Oxycodone  (Percocet)   **Note: The testing scope of this panel does not include small to  moderate amounts of these reported medications:   Buprenorphine (Belbuca)   **Note: The testing scope of this panel does not include the  following reported medications:   Acetaminophen (Percocet)  Amlodipine  Atorvastatin  Cholecalciferol  Cyanocobalamin  Docusate  Enoxaparin (Lovenox)  Gabapentin  Lisinopril  Pantoprazole (Protonix)  Sennosides  Supplement  Venlafaxine ==================================================================== For clinical consultation, please call [phone]. ====================================================================      ROS  Constitutional: Denies any fever or chills Gastrointestinal: No reported hemesis, hematochezia, vomiting, or acute GI distress Musculoskeletal: Denies any acute onset joint swelling, redness, loss of ROM, or weakness Neurological: No reported episodes of acute onset apraxia, aphasia, dysarthria, agnosia, amnesia, paralysis, loss of coordination, or loss of  consciousness  Medication Review  Alpha-Lipoic Acid, Buprenorphine HCl, Vitamin D3, amLODipine, atorvastatin, enoxaparin, gabapentin, lisinopril, senna-docusate, venlafaxine XR, and vitamin B-12  History Review  Allergy: [name] is allergic to contrast media [iodinated diagnostic agents] and cephalexin. Drug: [name]  reports no history of drug use. Alcohol:  reports current alcohol use. Tobacco:  reports that she quit smoking about 15 years ago. She has never used smokeless tobacco. Social: [name]  reports that she quit smoking about 15 years ago. She has never used smokeless tobacco. She reports current alcohol use. She reports that she does not use drugs. Medical:  has a past medical history of Allergy, Arthritis, Depression, GERD (gastroesophageal reflux disease), Hyperlipidemia, Hypertension, Iron deficiency anemia (11/01/2019), Neuromuscular disorder ([place]), and  Sleep apnea. Surgical: [name]  has a past surgical history that includes Spine surgery; Joint replacement (Right); Tubal ligation; Brain surgery (2002); Extracorporeal shock wave lithotripsy (Right, 04/27/2019); Spinal fusion (07/10/2019); Colonoscopy with propofol (N/A, 11/10/2019); Esophagogastroduodenoscopy (egd) with propofol (N/A, 11/10/2019); polypectomy (11/10/2019); and Knee Arthroplasty (Left, 01/12/2020). Family: family history includes Depression in her brother and sister; Hyperlipidemia in her sister; Hypertension in her mother and sister.  Laboratory Chemistry Profile   Renal Lab Results  Component Value Date   BUN 26 (H) 01/02/2020   CREATININE 1.02 (H) 01/02/2020   GFRAA >60 01/02/2020   GFRNONAA 56 (L) 01/02/2020     Hepatic Lab Results  Component Value Date   AST 19 01/02/2020   ALT 13 01/02/2020   ALBUMIN 3.8 01/02/2020   ALKPHOS 95 01/02/2020     Electrolytes Lab Results  Component Value Date   NA 140 01/02/2020   K 4.4 01/02/2020   CL 102 01/02/2020   CALCIUM 9.3 01/02/2020   MG 2.1 07/21/2019   PHOS 3.4 07/21/2019     Bone No results found for: VD25OH, VD125OH2TOT, KC1275TZ0, YF7494WH6, 25OHVITD1, 25OHVITD2, 25OHVITD3, TESTOFREE, TESTOSTERONE   Inflammation (CRP: Acute Phase) (ESR: Chronic Phase) Lab Results  Component Value Date   CRP 0.7 01/02/2020   ESRSEDRATE 43 (H) 01/02/2020   LATICACIDVEN 1.4 07/20/2019       Note: Above Lab results reviewed.  Recent Imaging Review  MR LUMBAR SPINE WO CONTRAST CLINICAL DATA:  Diffuse back pain with progression. Bilateral leg weakness.  EXAM: MRI LUMBAR SPINE WITHOUT CONTRAST  TECHNIQUE: Multiplanar, multisequence MR imaging of the lumbar spine was performed. No intravenous contrast was administered.  COMPARISON:  Lumbar spine radiographs 10/12/2019. CT abdomen pelvis 07/20/2019  FINDINGS: Segmentation: 5 non rib-bearing lumbar type vertebral bodies are present. The lowest fully formed vertebral  body is L5.  Alignment: Straightening of the normal lumbar lordosis is present following fusion L2-S1.  Vertebrae: Chronic sclerotic changes are present at L1-2. Signal is distorted by hardware L2-S1. Vertebral body heights are maintained.  Conus medullaris and cauda equina: Conus extends to the L1-2 level. Conus and cauda equina appear normal.  Paraspinal and other soft tissues: Limited imaging the abdomen is unremarkable. There is no significant adenopathy. No solid organ lesions are present.  Disc levels:  Posterior canal is decompressed fell laminectomy at L2, L3, L4, and L5. Foramina are widely patent at L2-3, L3-4, L4-5, and L5-S1.  Adjacent level disease is present at L1-2 broad-based disc protrusion and advanced facet hypertrophy resulting in moderate to severe central canal stenosis. Mild right foraminal narrowing is present.  IMPRESSION: 1. L2-S1 fusion following fusion with decompression of the central canal and foramina at these levels. 2. Adjacent level disease at L1-2 with moderate  to severe central canal stenosis and mild right foraminal narrowing. This is the most likely etiology of the patient's progressive pain and bilateral lower extremity weakness.  Electronically Signed   By: San Morelle M.D.   On: 04/24/2020 09:47 MR THORACIC SPINE WO CONTRAST CLINICAL DATA:  Diffuse back pain for 5 years with progression. Bilateral leg weakness. Multiple spinal surgeries including cervicothoracic fusion and lumbar fusion.  EXAM: MRI THORACIC SPINE WITHOUT CONTRAST  TECHNIQUE: Multiplanar, multisequence MR imaging of the thoracic spine was performed. No intravenous contrast was administered.  COMPARISON:  Spine radiographs 10/12/2019  FINDINGS: Alignment: No significant listhesis is present. There is some straightening of the normal thoracic kyphosis. Mild rightward curvature is centered at T7-8.  Vertebrae: Vertebral body heights are maintained.  Superior endplate Schmorl's node is present at T11. Chronic endplate marrow changes are noted at T7-8 and T8-9, left greater than right. Signal loss is cyst seated with posterior pedicle screws at T2 and T3 hemangioma is noted inferiorly on the left at T1.  Cord: Normal signal is present throughout the thoracic spinal cord to the conus medullaris which terminates at L1-2. Normal cord morphology is present.  Paraspinal and other soft tissues: Paraspinous soft tissues are within normal limits. The visualized lung fields are clear. 14 mm simple cyst is present at the upper pole of the right kidney.  Disc levels:  Minimal disc bulging is present at T7-8 and T8-9. No significant stenosis is present. The central canal is patent. No significant foraminal narrowing is present in the thoracic spine.  IMPRESSION: 1. Minimal disc bulging at T7-8 and T8-9 without significant central canal or foraminal stenosis. 2. Posterior fusion hardware at T2 and T3. 3. No acute abnormality or focal lesion in the thoracic spine to explain the patient's symptoms.  Electronically Signed   By: San Morelle M.D.   On: 04/24/2020 09:42 Note: Reviewed        Physical Exam  General appearance: Well nourished, well developed, and well hydrated. In no apparent acute distress Mental status: Alert, oriented x 3 (person, place, & time)       Respiratory: No evidence of acute respiratory distress Eyes: PERLA Vitals: BP (!) 146/70   Pulse 100   Temp 98.2 F (36.8 C) (Temporal)   Resp 18   Ht 5' 4"  (1.626 m)   Wt 195 lb (88.5 kg)   SpO2 97%   BMI 33.47 kg/m  BMI: Estimated body mass index is 33.47 kg/m as calculated from the following:   Height as of this encounter: 5' 4"  (1.626 m).   Weight as of this encounter: 195 lb (88.5 kg). Ideal: Ideal body weight: 54.7 kg (120 lb 9.5 oz) Adjusted ideal body weight: 68.2 kg (150 lb 5.7 oz)  Cervical Spine Area Exam  Skin & Axial Inspection: Well healed  scar from previous spine surgery detected Alignment: Symmetrical Functional ROM: Mechanically restricted ROM      Stability: No instability detected Muscle Tone/Strength: Functionally intact. No obvious neuro-muscular anomalies detected. Sensory (Neurological): Musculoskeletal pain pattern Palpation: No palpable anomalies              Upper Extremity (UE) Exam    Side: Right upper extremity  Side: Left upper extremity  Skin & Extremity Inspection: Skin color, temperature, and hair growth are WNL. No peripheral edema or cyanosis. No masses, redness, swelling, asymmetry, or associated skin lesions. No contractures.  Skin & Extremity Inspection: Skin color, temperature, and hair growth are WNL. No peripheral edema or cyanosis.  No masses, redness, swelling, asymmetry, or associated skin lesions. No contractures.  Functional ROM: Pain restricted ROM for shoulder  Functional ROM: Unrestricted ROM          Muscle Tone/Strength: Functionally intact. No obvious neuro-muscular anomalies detected.  Muscle Tone/Strength: Functionally intact. No obvious neuro-muscular anomalies detected.  Sensory (Neurological): Arthropathic arthralgia          Sensory (Neurological): Unimpaired          Palpation: No palpable anomalies              Palpation: No palpable anomalies              Provocative Test(s):  Phalen's test: deferred Tinel's test: deferred Apley's scratch test (touch opposite shoulder):  Action 1 (Across chest): Decreased ROM Action 2 (Overhead): Decreased ROM Action 3 (LB reach): Decreased ROM   Provocative Test(s):  Phalen's test: deferred Tinel's test: deferred Apley's scratch test (touch opposite shoulder):  Action 1 (Across chest): deferred Action 2 (Overhead): deferred Action 3 (LB reach): deferred    Lumbar Spine Area Exam  Skin & Axial Inspection: No masses, redness, or swelling Alignment: Symmetrical Functional ROM: Pain restricted ROM affecting both sides Stability: No  instability detected Muscle Tone/Strength: Functionally intact. No obvious neuro-muscular anomalies detected. Sensory (Neurological): Dermatomal pain pattern, overall improved since surgery  Lower Extremity Exam    Side: Right lower extremity  Side: Left lower extremity  Stability: No instability observed          Stability: No instability observed          Skin & Extremity Inspection: Skin color, temperature, and hair growth are WNL. No peripheral edema or cyanosis. No masses, redness, swelling, asymmetry, or associated skin lesions. No contractures.  Skin & Extremity Inspection: Skin color, temperature, and hair growth are WNL. No peripheral edema or cyanosis. No masses, redness, swelling, asymmetry, or associated skin lesions. No contractures.  Functional ROM: Pain restricted ROM for hip and knee joints          Functional ROM: Pain restricted ROM for hip and knee joints          Muscle Tone/Strength: Functionally intact. No obvious neuro-muscular anomalies detected.  Muscle Tone/Strength: Functionally intact. No obvious neuro-muscular anomalies detected.  Sensory (Neurological): Neurogenic pain pattern        Sensory (Neurological): Neurogenic pain pattern        DTR: Patellar: deferred today Achilles: deferred today Plantar: deferred today  DTR: Patellar: deferred today Achilles: deferred today Plantar: deferred today  Palpation: No palpable anomalies  Palpation: No palpable      Assessment   Status Diagnosis  Controlled Controlled Controlled 1. Chronic radicular lumbar pain   2. Spinal stenosis of lumbar region with neurogenic claudication   3. Postlaminectomy syndrome of lumbosacral region   4. History of fusion of lumbar spine   5. Primary osteoarthritis of left knee   6. Primary osteoarthritis of right shoulder   7. Foraminal stenosis of cervical region   8. Chronic pain syndrome      Plan of Care   [name] has a current medication list which  includes the following long-term medication(s): amlodipine, atorvastatin, enoxaparin, gabapentin, and lisinopril.  1.  Reduce belbuca from 750 mcg twice daily to 600 mcg twice daily.  Discussed effective strategies to reduce constipation burden. 2.  Intramuscular Norflex and Toradol today for increased right shoulder pain due to glenohumeral arthropathy. 3.  As needed right shoulder steroid injection 4.  Continue gabapentin as  prescribed, creatinine within normal limits  Pharmacotherapy (Medications Ordered): Meds ordered this encounter  Medications  . Buprenorphine HCl (BELBUCA) 600 MCG FILM    Sig: Place 1 Film inside cheek 2 (two) times daily.    Dispense:  60 each    Refill:  2  . orphenadrine (NORFLEX) injection 30 mg  . ketorolac (TORADOL) 30 MG/ML injection 30 mg   Orders:  Orders Placed This Encounter  Procedures  . SHOULDER INJECTION    For shoulder pain.    Standing Status:   Standing    Number of Occurrences:   1    Standing Expiration Date:   09/25/2021    Scheduling Instructions:     Side: RIGHT     Sedation: Patient's choice.     TIMEFRAME: PRN procedure. (Ms. Dearmas will call when needed.)    Order Specific Question:   Where will this procedure be performed?    Answer:   [facility]    Comments:   Aar[facility] Stotts   Follow-up plan:   Return in about 3 months (around 12/24/2020) for Medication Management, in person.   Recent Visits Date Type Provider Dept  08/13/20 Office Visit Gillis Santa, MD [facility]  Showing recent visits within past 90 days and meeting all other requirements Today's Visits Date Type Provider Dept  09/25/20 Office Visit Gillis Santa, MD [facility]  Showing today's visits and meeting all other requirements Future Appointments Date Type Provider Dept  12/24/20 Appointment Gillis Santa, MD [facility]  Showing future appointments within next 90 days and meeting all other requirements  I discussed the  assessment and treatment plan with the patient. The patient was provided an opportunity to ask questions and all were answered. The patient agreed with the plan and demonstrated an understanding of the instructions.  Patient advised to call back or seek an in-person evaluation if the symptoms or condition worsens.  Duration of encounter: 30 minutes.  Note by: Gillis Santa, MD Date: 09/25/2020; Time: 2:45 PM

## 2020-11-07 ENCOUNTER — Telehealth: Payer: Self-pay

## 2020-11-07 NOTE — Telephone Encounter (Signed)
Pt. Says that her Belbuca is costing over $700 out of pocket and she cannot afford to pay. She wants to call can a different med be called in

## 2020-11-07 NOTE — Telephone Encounter (Signed)
She'll need a visit to discuss alternatives

## 2020-11-07 NOTE — Telephone Encounter (Signed)
Informed patient of this per voicemail.

## 2020-11-11 ENCOUNTER — Other Ambulatory Visit: Payer: Self-pay

## 2020-11-11 ENCOUNTER — Encounter: Payer: Self-pay | Admitting: Student in an Organized Health Care Education/Training Program

## 2020-11-11 ENCOUNTER — Ambulatory Visit
Payer: Medicare Other | Attending: Student in an Organized Health Care Education/Training Program | Admitting: Student in an Organized Health Care Education/Training Program

## 2020-11-11 VITALS — BP 139/99 | HR 99 | Temp 97.2°F | Resp 18 | Ht 64.0 in | Wt 195.0 lb

## 2020-11-11 DIAGNOSIS — M1712 Unilateral primary osteoarthritis, left knee: Secondary | ICD-10-CM

## 2020-11-11 DIAGNOSIS — M4802 Spinal stenosis, cervical region: Secondary | ICD-10-CM | POA: Insufficient documentation

## 2020-11-11 DIAGNOSIS — G8929 Other chronic pain: Secondary | ICD-10-CM | POA: Insufficient documentation

## 2020-11-11 DIAGNOSIS — M75101 Unspecified rotator cuff tear or rupture of right shoulder, not specified as traumatic: Secondary | ICD-10-CM

## 2020-11-11 DIAGNOSIS — M25511 Pain in right shoulder: Secondary | ICD-10-CM | POA: Diagnosis not present

## 2020-11-11 DIAGNOSIS — M961 Postlaminectomy syndrome, not elsewhere classified: Secondary | ICD-10-CM | POA: Insufficient documentation

## 2020-11-11 DIAGNOSIS — M5416 Radiculopathy, lumbar region: Secondary | ICD-10-CM | POA: Insufficient documentation

## 2020-11-11 DIAGNOSIS — Z981 Arthrodesis status: Secondary | ICD-10-CM | POA: Diagnosis present

## 2020-11-11 DIAGNOSIS — M48062 Spinal stenosis, lumbar region with neurogenic claudication: Secondary | ICD-10-CM | POA: Diagnosis not present

## 2020-11-11 DIAGNOSIS — M19011 Primary osteoarthritis, right shoulder: Secondary | ICD-10-CM

## 2020-11-11 DIAGNOSIS — G894 Chronic pain syndrome: Secondary | ICD-10-CM | POA: Insufficient documentation

## 2020-11-11 DIAGNOSIS — M12811 Other specific arthropathies, not elsewhere classified, right shoulder: Secondary | ICD-10-CM | POA: Insufficient documentation

## 2020-11-11 MED ORDER — XTAMPZA ER 13.5 MG PO C12A
1.0000 | EXTENDED_RELEASE_CAPSULE | Freq: Two times a day (BID) | ORAL | 0 refills | Status: DC
Start: 2020-12-11 — End: 2020-12-11

## 2020-11-11 MED ORDER — XTAMPZA ER 13.5 MG PO C12A
1.0000 | EXTENDED_RELEASE_CAPSULE | Freq: Two times a day (BID) | ORAL | 0 refills | Status: DC
Start: 2020-11-11 — End: 2020-12-11

## 2020-11-11 NOTE — Progress Notes (Signed)
PROVIDER NOTE: Information contained herein reflects review and annotations entered in association with encounter. Interpretation of such information and data should be left to medically-trained personnel. Information provided to patient can be located elsewhere in the medical record under "Patient Instructions". Document created using STT-dictation technology, any transcriptional errors that may result from process are unintentional.    Patient: Lisa Good  Service Category: E/M  Provider: Gillis Santa, MD  DOB: [birth year]  DOS: 11/11/2020  Specialty: Interventional Pain Management  MRN: [id number]  Setting: Ambulatory outpatient  PCP: [name], MD  Type: Established Patient    Referring Provider: [name], MD  Location: Office  Delivery: Face-to-face     HPI  [name], a 70 y.o. year old female, is here today because of her Chronic radicular lumbar pain [M54.16, G89.29]. [name] primary complain today is Back Pain and Foot Pain Last encounter: My last encounter with her was on 09/25/2020. Pertinent problems: [name] has Cervical spondylosis without myelopathy; Foraminal stenosis of cervical region; Cervical stenosis of spinal canal; History of fusion of lumbar spine; Spinal stenosis of lumbar region with neurogenic claudication; Postlaminectomy syndrome of lumbosacral region; Chronic pain syndrome; Hx of cervical spine surgery; Primary osteoarthritis of left knee; Primary osteoarthritis of right shoulder; Chronic radicular lumbar pain; Cervical spondylosis with myelopathy; Kyphosis of cervical region; Fibromyalgia; Spondylolisthesis, lumbar region; and Primary osteoarthritis of right knee on their pertinent problem list. Pain Assessment: Severity of Chronic pain is reported as a 5 /10. Location: Back Lower/Denies. Onset: More than a month ago. Quality: Aching,Constant,Throbbing. Timing: Constant. Modifying factor(s): Resting and laying down. Vitals:  height is 5'  4" (1.626 m) and weight is 195 lb (88.5 kg). Her temporal temperature is 97.2 F (36.2 C) (abnormal). Her blood pressure is 139/99 (abnormal) and her pulse is 99. Her respiration is 18 and oxygen saturation is 98%.   Reason for encounter: medication management.   Patient presents today to discuss alternatives to her belbuca.  She states that it is costing her over $700 a month and this is not something that she can continue.  We discussed alternatives including tramadol ER versus Xtampza ER.  She has trialed extensive in the past and did find benefit with this so we can trial this.  I am instructed the patient to discontinue her belbuca and start Xtampza ER 13.5 mg every 12 hours as below.  Patient is also having worsening right shoulder pain related to shoulder osteoarthritis and rotator cuff dysfunction.  She has trouble lifting her arm up to perform basic activities.  We discussed right glenohumeral steroid joint injection as well as right suprascapular nerve block.  Risk and benefits reviewed and patient like to proceed.  Pharmacotherapy Assessment   Analgesic: Transition to [facility] ER 13.5 mg every 12 hours Monitoring: [place] PMP: PDMP reviewed during this encounter.       Pharmacotherapy: No side-effects or adverse reactions reported. Compliance: No problems identified. Effectiveness: Clinically acceptable.  Janne Napoleon, RN  11/11/2020  2:03 PM  Sign when Signing Visit Safety precautions to be maintained throughout the outpatient stay will include: orient to surroundings, keep bed in low position, maintain call bell within reach at all times, provide assistance with transfer out of bed and ambulation.     UDS:  Summary  Date Value Ref Range Status  05/06/2020 Note  Final    Comment:    ==================================================================== [employer] [facility] 13 (MW) ==================================================================== Test  Result       Flag       Units  Drug Present and Declared for Prescription Verification   Noroxycodone                   95           EXPECTED   ng/mg creat    Noroxycodone is an expected metabolite of oxycodone. Sources of    oxycodone include scheduled prescription medications.    Buprenorphine                  13           EXPECTED   ng/mg creat   Norbuprenorphine               110          EXPECTED   ng/mg creat    Source of buprenorphine is a scheduled prescription medication.    Norbuprenorphine is an expected metabolite of buprenorphine.  Drug Absent but Declared for Prescription Verification   Oxycodone                      Not Detected UNEXPECTED ng/mg creat    Oxycodone is almost always present in patients taking this drug    consistently.  Absence of oxycodone could be due to lapse of time    since the last dose or unusual pharmacokinetics (rapid metabolism).  ==================================================================== Test                      Result    Flag   Units      Ref Range   Creatinine              62               mg/dL      >=20 ==================================================================== Declared Medications:  The flagging and interpretation on this report are based on the  following declared medications.  Unexpected results may arise from  inaccuracies in the declared medications.   **Note: The testing scope of this panel includes these medications:   Oxycodone (Percocet)   **Note: The testing scope of this panel does not include small to  moderate amounts of these reported medications:   Buprenorphine (Belbuca)   **Note: The testing scope of this panel does not include the  following reported medications:   Acetaminophen (Percocet)  Amlodipine  Atorvastatin  Cholecalciferol  Cyanocobalamin  Docusate  Enoxaparin (Lovenox)  Gabapentin  Lisinopril  Pantoprazole (Protonix)  Sennosides  Supplement   Venlafaxine ==================================================================== For clinical consultation, please call [phone]. ====================================================================      ROS  Constitutional: Denies any fever or chills Gastrointestinal: No reported hemesis, hematochezia, vomiting, or acute GI distress Musculoskeletal: Low back, right shoulder pain Neurological: Lower extremity paresthesias  Medication Review  Alpha-Lipoic Acid, Vitamin D3, amLODipine, atorvastatin, gabapentin, lisinopril, oxyCODONE ER, senna-docusate, venlafaxine XR, and vitamin B-12  History Review  Allergy: [name] is allergic to contrast media [iodinated diagnostic agents] and cephalexin. Drug: [name]  reports no history of drug use. Alcohol:  reports current alcohol use. Tobacco:  reports that she quit smoking about 16 years ago. She has never used smokeless tobacco. Social: [name]  reports that she quit smoking about 16 years ago. She has never used smokeless tobacco. She reports current alcohol use. She reports that she does not use drugs. Medical:  has a past medical history of Allergy, Arthritis, Depression, GERD (  gastroesophageal reflux disease), Hyperlipidemia, Hypertension, Iron deficiency anemia (11/01/2019), Neuromuscular disorder (HCC), and Sleep apnea. Surgical: [name]  has a past surgical history that includes Spine surgery; Joint replacement (Right); Tubal ligation; Brain surgery (2002); Extracorporeal shock wave lithotripsy (Right, 04/27/2019); Spinal fusion (07/10/2019); Colonoscopy with propofol (N/A, 11/10/2019); Esophagogastroduodenoscopy (egd) with propofol (N/A, 11/10/2019); polypectomy (11/10/2019); and Knee Arthroplasty (Left, 01/12/2020). Family: family history includes Depression in her brother and sister; Hyperlipidemia in her sister; Hypertension in her mother and sister.  Laboratory Chemistry Profile   Renal Lab Results  Component Value Date    BUN 26 (H) 01/02/2020   CREATININE 1.02 (H) 01/02/2020   GFRAA >60 01/02/2020   GFRNONAA 56 (L) 01/02/2020     Hepatic Lab Results  Component Value Date   AST 19 01/02/2020   ALT 13 01/02/2020   ALBUMIN 3.8 01/02/2020   ALKPHOS 95 01/02/2020     Electrolytes Lab Results  Component Value Date   NA 140 01/02/2020   K 4.4 01/02/2020   CL 102 01/02/2020   CALCIUM 9.3 01/02/2020   MG 2.1 07/21/2019   PHOS 3.4 07/21/2019     Bone No results found for: VD25OH, VD125OH2TOT, VD3125OH2, VD2125OH2, 25OHVITD1, 25OHVITD2, 25OHVITD3, TESTOFREE, TESTOSTERONE   Inflammation (CRP: Acute Phase) (ESR: Chronic Phase) Lab Results  Component Value Date   CRP 0.7 01/02/2020   ESRSEDRATE 43 (H) 01/02/2020   LATICACIDVEN 1.4 07/20/2019       Note: Above Lab results reviewed.  Recent Imaging Review  MR LUMBAR SPINE WO CONTRAST CLINICAL DATA:  Diffuse back pain with progression. Bilateral leg weakness.  EXAM: MRI LUMBAR SPINE WITHOUT CONTRAST  TECHNIQUE: Multiplanar, multisequence MR imaging of the lumbar spine was performed. No intravenous contrast was administered.  COMPARISON:  Lumbar spine radiographs 10/12/2019. CT abdomen pelvis 07/20/2019  FINDINGS: Segmentation: 5 non rib-bearing lumbar type vertebral bodies are present. The lowest fully formed vertebral body is L5.  Alignment: Straightening of the normal lumbar lordosis is present following fusion L2-S1.  Vertebrae: Chronic sclerotic changes are present at L1-2. Signal is distorted by hardware L2-S1. Vertebral body heights are maintained.  Conus medullaris and cauda equina: Conus extends to the L1-2 level. Conus and cauda equina appear normal.  Paraspinal and other soft tissues: Limited imaging the abdomen is unremarkable. There is no significant adenopathy. No solid organ lesions are present.  Disc levels:  Posterior canal is decompressed fell laminectomy at L2, L3, L4, and L5. Foramina are widely patent at  L2-3, L3-4, L4-5, and L5-S1.  Adjacent level disease is present at L1-2 broad-based disc protrusion and advanced facet hypertrophy resulting in moderate to severe central canal stenosis. Mild right foraminal narrowing is present.  IMPRESSION: 1. L2-S1 fusion following fusion with decompression of the central canal and foramina at these levels. 2. Adjacent level disease at L1-2 with moderate to severe central canal stenosis and mild right foraminal narrowing. This is the most likely etiology of the patient's progressive pain and bilateral lower extremity weakness.  Electronically Signed   By: Christopher  Mattern M.D.   On: 04/24/2020 09:47 MR THORACIC SPINE WO CONTRAST CLINICAL DATA:  Diffuse back pain for 5 years with progression. Bilateral leg weakness. Multiple spinal surgeries including cervicothoracic fusion and lumbar fusion.  EXAM: MRI THORACIC SPINE WITHOUT CONTRAST  TECHNIQUE: Multiplanar, multisequence MR imaging of the thoracic spine was performed. No intravenous contrast was administered.  COMPARISON:  Spine radiographs 10/12/2019  FINDINGS: Alignment: No significant listhesis is present. There is some straightening of the normal thoracic kyphosis.   Mild rightward curvature is centered at T7-8.  Vertebrae: Vertebral body heights are maintained. Superior endplate Schmorl's node is present at T11. Chronic endplate marrow changes are noted at T7-8 and T8-9, left greater than right. Signal loss is cyst seated with posterior pedicle screws at T2 and T3 hemangioma is noted inferiorly on the left at T1.  Cord: Normal signal is present throughout the thoracic spinal cord to the conus medullaris which terminates at L1-2. Normal cord morphology is present.  Paraspinal and other soft tissues: Paraspinous soft tissues are within normal limits. The visualized lung fields are clear. 14 mm simple cyst is present at the upper pole of the right kidney.  Disc  levels:  Minimal disc bulging is present at T7-8 and T8-9. No significant stenosis is present. The central canal is patent. No significant foraminal narrowing is present in the thoracic spine.  IMPRESSION: 1. Minimal disc bulging at T7-8 and T8-9 without significant central canal or foraminal stenosis. 2. Posterior fusion hardware at T2 and T3. 3. No acute abnormality or focal lesion in the thoracic spine to explain the patient's symptoms.  Electronically Signed   By: San Morelle M.D.   On: 04/24/2020 09:42 Note: Reviewed        Physical Exam  General appearance: Well nourished, well developed, and well hydrated. In no apparent acute distress Mental status: Alert, oriented x 3 (person, place, & time)       Respiratory: No evidence of acute respiratory distress Eyes: PERLA Vitals: BP (!) 139/99   Pulse 99   Temp (!) 97.2 F (36.2 C) (Temporal)   Resp 18   Ht 5' 4" (1.626 m)   Wt 195 lb (88.5 kg)   SpO2 98%   BMI 33.47 kg/m  BMI: Estimated body mass index is 33.47 kg/m as calculated from the following:   Height as of this encounter: 5' 4" (1.626 m).   Weight as of this encounter: 195 lb (88.5 kg). Ideal: Ideal body weight: 54.7 kg (120 lb 9.5 oz) Adjusted ideal body weight: 68.2 kg (150 lb 5.7 oz)  Cervical Spine Area Exam  Skin & Axial Inspection: No masses, redness, edema, swelling, or associated skin lesions Alignment: Symmetrical Functional ROM: Pain restricted ROM, to the right Stability: No instability detected Muscle Tone/Strength: Functionally intact. No obvious neuro-muscular anomalies detected. Sensory (Neurological): Neurogenic pain pattern  Upper Extremity (UE) Exam    Side: Right upper extremity  Side: Left upper extremity  Skin & Extremity Inspection: Skin color, temperature, and hair growth are WNL. No peripheral edema or cyanosis. No masses, redness, swelling, asymmetry, or associated skin lesions. No contractures.  Skin & Extremity Inspection:  Skin color, temperature, and hair growth are WNL. No peripheral edema or cyanosis. No masses, redness, swelling, asymmetry, or associated skin lesions. No contractures.  Functional ROM: Pain restricted ROM for shoulder  Functional ROM: Unrestricted ROM          Muscle Tone/Strength: Functionally intact. No obvious neuro-muscular anomalies detected.  Muscle Tone/Strength: Functionally intact. No obvious neuro-muscular anomalies detected.  Sensory (Neurological): Arthropathic arthralgia          Sensory (Neurological): Unimpaired          Palpation: No palpable anomalies              Palpation: No palpable anomalies              Provocative Test(s):  Phalen's test: deferred Tinel's test: deferred Apley's scratch test (touch opposite shoulder):  Action 1 (Across chest):  Decreased ROM Action 2 (Overhead): Decreased ROM Action 3 (LB reach): Decreased ROM   Provocative Test(s):  Phalen's test: deferred Tinel's test: deferred Apley's scratch test (touch opposite shoulder):  Action 1 (Across chest): deferred Action 2 (Overhead): deferred Action 3 (LB reach): deferred     Assessment   Status Diagnosis  Persistent Having a Flare-up Having a Flare-up 1. Chronic radicular lumbar pain   2. Right rotator cuff tear arthropathy   3. Chronic right shoulder pain   4. Spinal stenosis of lumbar region with neurogenic claudication   5. Postlaminectomy syndrome of lumbosacral region   6. History of fusion of lumbar spine   7. Primary osteoarthritis of left knee   8. Primary osteoarthritis of right shoulder   9. Foraminal stenosis of cervical region   10. Chronic pain syndrome      Updated Problems: Problem  Chronic Right Shoulder Pain  Right Rotator Cuff Tear Arthropathy    Plan of Care   [name] has a current medication list which includes the following long-term medication(s): amlodipine, atorvastatin, gabapentin, and lisinopril.  Pharmacotherapy (Medications Ordered): Meds  ordered this encounter  Medications  . oxyCODONE ER (XTAMPZA ER) 13.5 MG C12A    Sig: Take 1 capsule by mouth every 12 (twelve) hours.    Dispense:  60 capsule    Refill:  0  . oxyCODONE ER (XTAMPZA ER) 13.5 MG C12A    Sig: Take 1 capsule by mouth every 12 (twelve) hours.    Dispense:  60 capsule    Refill:  0   Orders:  Orders Placed This Encounter  Procedures  . SUPRASCAPULAR NERVE BLOCK    For shoulder pain.    Standing Status:   Future    Standing Expiration Date:   02/08/2021    Scheduling Instructions:     RIGHT     Level(s): Suprascapular notch     Sedation: Patient's choice.     Scheduling Timeframe: As permitted by the schedule    Order Specific Question:   Where will this procedure be performed?    Answer:   [facility]  . SHOULDER INJECTION    Standing Status:   Future    Standing Expiration Date:   02/08/2021    Scheduling Instructions:     Side: RIGHT     Sedation: Patient's choice.     Timeframe: As soon as schedule allows    Order Specific Question:   Where will this procedure be performed?    Answer:   [facility]    Comments:   [place] [facility]   Follow-up plan:   Return in about 2 weeks (around 11/25/2020) for R SSNB + R shoulder injection w/o (40 mins).   Recent Visits Date Type Provider Dept  09/25/20 Office Visit Lanny Lipkin, MD [facility]  08/13/20 Office Visit Thy Gullikson, MD [facility]  Showing recent visits within past 90 days and meeting all other requirements Today's Visits Date Type Provider Dept  11/11/20 Office Visit Tiya Schrupp, MD [facility]  Showing today's visits and meeting all other requirements Future Appointments Date Type Provider Dept  12/24/20 Appointment Briston Lax, MD [facility]  Showing future appointments within next 90 days and meeting all other requirements  I discussed the assessment and treatment plan with the patient. The patient was provided an opportunity to  ask questions and all were answered. The patient agreed with the plan and demonstrated an understanding of the instructions.  Patient advised   to call back or seek an in-person evaluation if the symptoms or condition worsens.  Duration of encounter: 30 minutes.  Note by:  , MD Date: 11/11/2020; Time: 2:52 PM 

## 2020-11-11 NOTE — Progress Notes (Signed)
Safety precautions to be maintained throughout the outpatient stay will include: orient to surroundings, keep bed in low position, maintain call bell within reach at all times, provide assistance with transfer out of bed and ambulation.  

## 2020-11-11 NOTE — Patient Instructions (Signed)
____________________________________________________________________________________________  Preparing for your procedure (without sedation)  Procedure appointments are limited to planned procedures: . No Prescription Refills. . No disability issues will be discussed. . No medication changes will be discussed.  Instructions: . Oral Intake: Do not eat or drink anything for at least 2 hours prior to your procedure. (Exception: Blood Pressure Medication. See below.) . Transportation: Unless otherwise stated by your physician, you may drive yourself after the procedure. . Blood Pressure Medicine: Do not forget to take your blood pressure medicine with a sip of water the morning of the procedure. If your Diastolic (lower reading)is above 100 mmHg, elective cases will be cancelled/rescheduled. . Blood thinners: These will need to be stopped for procedures. Notify our staff if you are taking any blood thinners. Depending on which one you take, there will be specific instructions on how and when to stop it. . Diabetics on insulin: Notify the staff so that you can be scheduled 1st case in the morning. If your diabetes requires high dose insulin, take only  of your normal insulin dose the morning of the procedure and notify the staff that you have done so. . Preventing infections: Shower with an antibacterial soap the morning of your procedure.  . Build-up your immune system: Take 1000 mg of Vitamin C with every meal (3 times a day) the day prior to your procedure. Marland Kitchen Antibiotics: Inform the staff if you have a condition or reason that requires you to take antibiotics before dental procedures. . Pregnancy: If you are pregnant, call and cancel the procedure. . Sickness: If you have a cold, fever, or any active infections, call and cancel the procedure. . Arrival: You must be in the facility at least 30 minutes prior to your scheduled procedure. . Children: Do not bring any children with you. . Dress  appropriately: Bring dark clothing that you would not mind if they get stained. . Valuables: Do not bring any jewelry or valuables.  Reasons to call and reschedule or cancel your procedure: (Following these recommendations will minimize the risk of a serious complication.) . Surgeries: Avoid having procedures within 2 weeks of any surgery. (Avoid for 2 weeks before or after any surgery). . Flu Shots: Avoid having procedures within 2 weeks of a flu shots or . (Avoid for 2 weeks before or after immunizations). . Barium: Avoid having a procedure within 7-10 days after having had a radiological study involving the use of radiological contrast. (Myelograms, Barium swallow or enema study). . Heart attacks: Avoid any elective procedures or surgeries for the initial 6 months after a "Myocardial Infarction" (Heart Attack). . Blood thinners: It is imperative that you stop these medications before procedures. Let us know if you if you take any blood thinner.  . Infection: Avoid procedures during or within two weeks of an infection (including chest colds or gastrointestinal problems). Symptoms associated with infections include: Localized redness, fever, chills, night sweats or profuse sweating, burning sensation when voiding, cough, congestion, stuffiness, runny nose, sore throat, diarrhea, nausea, vomiting, cold or Flu symptoms, recent or current infections. It is specially important if the infection is over the area that we intend to treat. Marland Kitchen Heart and lung problems: Symptoms that may suggest an active cardiopulmonary problem include: cough, chest pain, breathing difficulties or shortness of breath, dizziness, ankle swelling, uncontrolled high or unusually low blood pressure, and/or palpitations. If you are experiencing any of these symptoms, cancel your procedure and contact your primary care physician for an evaluation.  Remember:  Regular  Business hours are:  Monday to Thursday 8:00 AM to 4:00  PM  Provider's Schedule: Milinda Pointer, MD:  Procedure days: Tuesday and Thursday 7:30 AM to 4:00 PM  Gillis Santa, MD:  Procedure days: Monday and Wednesday 7:30 AM to 4:00 PM ____________________________________________________________________________________________

## 2020-11-18 ENCOUNTER — Telehealth: Payer: Self-pay | Admitting: Student in an Organized Health Care Education/Training Program

## 2020-11-18 DIAGNOSIS — G8929 Other chronic pain: Secondary | ICD-10-CM

## 2020-11-18 NOTE — Telephone Encounter (Signed)
I called [facility] to verify this. She has not filled the prescription, it is around $500.

## 2020-11-18 NOTE — Telephone Encounter (Signed)
Patient lvmail 11-16-20 stating she is unable to get either of the scripts for pain meds filled due to cost of $500  She has no meds for her pain . Please call asap

## 2020-11-19 MED ORDER — OXYCODONE-ACETAMINOPHEN 7.5-325 MG PO TABS: 1.0000 | ORAL_TABLET | Freq: Three times a day (TID) | ORAL | 0 refills | Status: DC | PRN

## 2020-11-19 NOTE — Telephone Encounter (Signed)
Patient's insurance will not cover her buprenorphine or her extended release oxycodone.  At this point, we do not have any other options other than immediate release, short acting opioid analgesics.  I will send in a prescription for Percocet 7.5 mg 3 times daily as needed.  Patient will follow with me in 1 month.

## 2020-11-19 NOTE — Telephone Encounter (Signed)
Patient notified that prescription for Percocet was sent to the pharmacy. Given instructions on how to take it. She has an appt on 12-24-20. Advised to cancel that one and schedule an appt for before 12-19-20. She opted to keep the appt already scheduled, stating she does not plan to take the Percocet around the clock.

## 2020-12-01 ENCOUNTER — Encounter: Payer: Self-pay | Admitting: Student in an Organized Health Care Education/Training Program

## 2020-12-11 ENCOUNTER — Ambulatory Visit (HOSPITAL_BASED_OUTPATIENT_CLINIC_OR_DEPARTMENT_OTHER): Payer: Medicare Other | Admitting: Student in an Organized Health Care Education/Training Program

## 2020-12-11 ENCOUNTER — Ambulatory Visit
Admission: RE | Admit: 2020-12-11 | Discharge: 2020-12-11 | Disposition: A | Discharge status: Home / Self Care | Payer: Medicare Other | Source: Ambulatory Visit | Attending: Student in an Organized Health Care Education/Training Program | Admitting: Student in an Organized Health Care Education/Training Program

## 2020-12-11 ENCOUNTER — Other Ambulatory Visit: Payer: Self-pay

## 2020-12-11 ENCOUNTER — Encounter: Payer: Self-pay | Admitting: Student in an Organized Health Care Education/Training Program

## 2020-12-11 DIAGNOSIS — G8929 Other chronic pain: Secondary | ICD-10-CM | POA: Diagnosis present

## 2020-12-11 DIAGNOSIS — M19011 Primary osteoarthritis, right shoulder: Secondary | ICD-10-CM | POA: Insufficient documentation

## 2020-12-11 DIAGNOSIS — M12811 Other specific arthropathies, not elsewhere classified, right shoulder: Secondary | ICD-10-CM | POA: Diagnosis present

## 2020-12-11 DIAGNOSIS — M25511 Pain in right shoulder: Secondary | ICD-10-CM | POA: Diagnosis present

## 2020-12-11 DIAGNOSIS — M75101 Unspecified rotator cuff tear or rupture of right shoulder, not specified as traumatic: Secondary | ICD-10-CM | POA: Insufficient documentation

## 2020-12-11 DIAGNOSIS — G894 Chronic pain syndrome: Secondary | ICD-10-CM | POA: Diagnosis present

## 2020-12-11 DIAGNOSIS — M5416 Radiculopathy, lumbar region: Secondary | ICD-10-CM | POA: Diagnosis present

## 2020-12-11 MED ORDER — LIDOCAINE HCL 2 % IJ SOLN
20.0000 mL | Freq: Once | INTRAMUSCULAR | Status: AC
  Administered 2020-12-11: 400 mg

## 2020-12-11 MED ORDER — ROPIVACAINE HCL 2 MG/ML IJ SOLN
INTRAMUSCULAR | Status: AC
  Filled 2020-12-11: qty 10

## 2020-12-11 MED ORDER — ROPIVACAINE HCL 2 MG/ML IJ SOLN
4.0000 mL | Freq: Once | INTRAMUSCULAR | Status: AC
  Administered 2020-12-11: 4 mL via PERINEURAL

## 2020-12-11 MED ORDER — DEXAMETHASONE SODIUM PHOSPHATE 10 MG/ML IJ SOLN
INTRAMUSCULAR | Status: AC
  Filled 2020-12-11: qty 1

## 2020-12-11 MED ORDER — LIDOCAINE HCL 2 % IJ SOLN
INTRAMUSCULAR | Status: AC
  Filled 2020-12-11: qty 20

## 2020-12-11 MED ORDER — OXYCODONE-ACETAMINOPHEN 7.5-325 MG PO TABS: 1.0000 | ORAL_TABLET | Freq: Four times a day (QID) | ORAL | 0 refills | Status: DC | PRN

## 2020-12-11 MED ORDER — DEXAMETHASONE SODIUM PHOSPHATE 10 MG/ML IJ SOLN
10.0000 mg | Freq: Once | INTRAMUSCULAR | Status: AC
  Administered 2020-12-11: 10 mg

## 2020-12-11 MED ORDER — IOHEXOL 180 MG/ML  SOLN
10.0000 mL | Freq: Once | INTRAMUSCULAR | Status: AC
  Administered 2020-12-11: 10 mL via INTRA_ARTICULAR

## 2020-12-11 NOTE — Progress Notes (Signed)
Safety precautions to be maintained throughout the outpatient stay will include: orient to surroundings, keep bed in low position, maintain call bell within reach at all times, provide assistance with transfer out of bed and ambulation.  

## 2020-12-11 NOTE — Progress Notes (Signed)
PROVIDER NOTE: Information contained herein reflects review and annotations entered in association with encounter. Interpretation of such information and data should be left to medically-trained personnel. Information provided to patient can be located elsewhere in the medical record under "Patient Instructions". Document created using STT-dictation technology, any transcriptional errors that may result from process are unintentional.    Patient: Lisa Good  Service Category: Procedure  Provider: Gillis Santa, MD  DOB: November 03, 1950  DOS: 12/11/2020  Location: [facility]  MRN: [id number]  Setting: Ambulatory - outpatient  Referring Provider: Gillis Santa, MD  Type: Established Patient  Specialty: Interventional Pain Management  PCP: [name], MD   Primary Reason for Visit: Interventional Pain Management Treatment. CC: Arm Pain (right)  Procedure:          Anesthesia, Analgesia, Anxiolysis:  Type: Diagnostic Suprascapular nerve Block #1  Primary Purpose: Diagnostic Region: Posterior Shoulder & Scapular Areas Level: Superior to the scapular spine, in the lateral aspect of the supraspinatus fossa (Suprescapular notch). Target Area: Suprascapular nerve as it passes thru the lower portion of the suprascapular notch. Approach: Posterior percutaneous approach. Laterality: Right-Side  Type: Local Anesthesia  Local Anesthetic: Lidocaine 1-2%  Position: Prone   Indications: 1. Primary osteoarthritis of right shoulder   2. Chronic pain syndrome   3. Chronic right shoulder pain   4. Right rotator cuff tear arthropathy   5. Chronic radicular lumbar pain    Pain Score: Pre-procedure: 6 /10 Post-procedure: 2  (able to put shirt on and lift arm about 50 degrees, higher than before procedure)/10   Unfortunately, patient is having a difficult time managing her pain on her current medication regimen.  Patient was previously on belbuca which was successful in managing her  chronic pain however due to insurance cost, this medication is over $800 a month for her which she is not able to afford.  At her last clinic visit, given the cost of belbuca we transitioned her to Percocet 7.5 mg 3 times daily as needed for breakthrough pain with Xtampza ER 13.5 mg twice daily.  She did fill the [facility] ER however patient states that it is also very expensive and cost her over $500 for the 1 month prescription.  She states that she cannot continue this going forward.  We will change her Percocet 7.5 mg 3 times daily as needed to 4 times daily as needed as insurance is not covering any long-acting analgesics for her unfortunately..  Today she presents for a right suprascapular nerve block for severe and persistent right shoulder pain related to shoulder osteoarthritis.  Pre-op H&P Assessment:  Lisa Good is a 70 y.o. (year old), female patient, seen today for interventional treatment. She  has a past surgical history that includes Spine surgery; Joint replacement (Right); Tubal ligation; Brain surgery (2002); Extracorporeal shock wave lithotripsy (Right, 04/27/2019); Spinal fusion (07/10/2019); Colonoscopy with propofol (N/A, 11/10/2019); Esophagogastroduodenoscopy (egd) with propofol (N/A, 11/10/2019); polypectomy (11/10/2019); and Knee Arthroplasty (Left, 01/12/2020). Ms. Kilgour has a current medication list which includes the following prescription(s): alpha-lipoic acid, amlodipine, atorvastatin, vitamin d3, gabapentin, lisinopril, senna-docusate, venlafaxine xr, vitamin b-12, and [START ON 12/14/2020] oxycodone-acetaminophen. Her primarily concern today is the Arm Pain (right)  Initial Vital Signs:  Pulse/HCG Rate: 86  Temp: (!) 97.2 F (36.2 C) Resp: 16 BP: (!) 149/72 SpO2: 99 %  BMI: Estimated body mass index is 33.47 kg/m as calculated from the following:   Height as of this encounter: 5' 4"  (1.626 m).   Weight as of  this encounter: 195 lb (88.5 kg).  Risk Assessment: Allergies:  Reviewed. She is allergic to contrast media [iodinated diagnostic agents] and cephalexin.  Allergy Precautions: None required Coagulopathies: Reviewed. None identified.  Blood-thinner therapy: None at this time Active Infection(s): Reviewed. None identified. Ms. Glassberg is afebrile  Site Confirmation: Ms. Hannig was asked to confirm the procedure and laterality before marking the site Procedure checklist: Completed Consent: Before the procedure and under the influence of no sedative(s), amnesic(s), or anxiolytics, the patient was informed of the treatment options, risks and possible complications. To fulfill our ethical and legal obligations, as recommended by the American Medical Association's Code of Ethics, I have informed the patient of my clinical impression; the nature and purpose of the treatment or procedure; the risks, benefits, and possible complications of the intervention; the alternatives, including doing nothing; the risk(s) and benefit(s) of the alternative treatment(s) or procedure(s); and the risk(s) and benefit(s) of doing nothing. The patient was provided information about the general risks and possible complications associated with the procedure. These may include, but are not limited to: failure to achieve desired goals, infection, bleeding, organ or nerve damage, allergic reactions, paralysis, and death. In addition, the patient was informed of those risks and complications associated to the procedure, such as failure to decrease pain; infection; bleeding; organ or nerve damage with subsequent damage to sensory, motor, and/or autonomic systems, resulting in permanent pain, numbness, and/or weakness of one or several areas of the body; allergic reactions; (i.e.: anaphylactic reaction); and/or death. Furthermore, the patient was informed of those risks and complications associated with the medications. These include, but are not limited to: allergic reactions (i.e.: anaphylactic or  anaphylactoid reaction(s)); adrenal axis suppression; blood sugar elevation that in diabetics may result in ketoacidosis or comma; water retention that in patients with history of congestive heart failure may result in shortness of breath, pulmonary edema, and decompensation with resultant heart failure; weight gain; swelling or edema; medication-induced neural toxicity; particulate matter embolism and blood vessel occlusion with resultant organ, and/or nervous system infarction; and/or aseptic necrosis of one or more joints. Finally, the patient was informed that Medicine is not an exact science; therefore, there is also the possibility of unforeseen or unpredictable risks and/or possible complications that may result in a catastrophic outcome. The patient indicated having understood very clearly. We have given the patient no guarantees and we have made no promises. Enough time was given to the patient to ask questions, all of which were answered to the patient's satisfaction. Ms. Labrum has indicated that she wanted to continue with the procedure. Attestation: I, the ordering provider, attest that I have discussed with the patient the benefits, risks, side-effects, alternatives, likelihood of achieving goals, and potential problems during recovery for the procedure that I have provided informed consent. Date  Time: 12/11/2020 11:39 AM  Pre-Procedure Preparation:  Monitoring: As per clinic protocol. Respiration, ETCO2, SpO2, BP, heart rate and rhythm monitor placed and checked for adequate function Safety Precautions: Patient was assessed for positional comfort and pressure points before starting the procedure. Time-out: I initiated and conducted the "Time-out" before starting the procedure, as per protocol. The patient was asked to participate by confirming the accuracy of the "Time Out" information. Verification of the correct person, site, and procedure were performed and confirmed by me, the nursing  staff, and the patient. "Time-out" conducted as per Joint Commission's Universal Protocol (UP.01.01.01). Time: 1203  Description of Procedure:          Area Prepped: Entire  shoulder Area DuraPrep (Iodine Povacrylex [0.7% available iodine] and Isopropyl Alcohol, 74% w/w) Safety Precautions: Aspiration looking for blood return was conducted prior to all injections. At no point did we inject any substances, as a needle was being advanced. No attempts were made at seeking any paresthesias. Safe injection practices and needle disposal techniques used. Medications properly checked for expiration dates. SDV (single dose vial) medications used. Description of the Procedure: Protocol guidelines were followed. The patient was placed in position over the procedure table. The target area was identified and the area prepped in the usual manner. Skin & deeper tissues infiltrated with local anesthetic. Appropriate amount of time allowed to pass for local anesthetics to take effect. The procedure needles were then advanced to the target area. Proper needle placement secured. Negative aspiration confirmed. Solution injected in intermittent fashion, asking for systemic symptoms every 0.5cc of injectate. The needles were then removed and the area cleansed, making sure to leave some of the prepping solution back to take advantage of its long term bactericidal properties.  Vitals:   12/11/20 1142 12/11/20 1143 12/11/20 1200 12/11/20 1210  BP:  (!) 149/72 (!) 145/70 136/86  Pulse:  86 97 94  Resp:  16 16 16   Temp: (!) 97.2 F (36.2 C)     SpO2:  99% 99% 98%  Weight: 195 lb (88.5 kg)     Height: 5' 4"  (1.626 m)       Start Time: 1203 hrs. End Time: 1209 hrs. Materials:  Needle(s) Type: Spinal Needle Gauge: 22G Length: 3.5-in Medication(s): Please see orders for medications and dosing details. 5 cc solution made of 4 cc of 0.2% ropivacaine, 1 cc of Decadron 10 mg/cc.  Injected along the right suprascapular nerve  after contrast spread. Imaging Guidance (Non-Spinal):          Type of Imaging Technique: Fluoroscopy Guidance (Non-Spinal) Indication(s): Assistance in needle guidance and placement for procedures requiring needle placement in or near specific anatomical locations not easily accessible without such assistance. Exposure Time: Please see nurses notes. Contrast: Before injecting any contrast, we confirmed that the patient did not have an allergy to iodine, shellfish, or radiological contrast. Once satisfactory needle placement was completed at the desired level, radiological contrast was injected. Contrast injected under live fluoroscopy. No contrast complications. See chart for type and volume of contrast used. Fluoroscopic Guidance: I was personally present during the use of fluoroscopy. "Tunnel Vision Technique" used to obtain the best possible view of the target area. Parallax error corrected before commencing the procedure. "Direction-depth-direction" technique used to introduce the needle under continuous pulsed fluoroscopy. Once target was reached, antero-posterior, oblique, and lateral fluoroscopic projection used confirm needle placement in all planes. Images permanently stored in EMR. Interpretation: I personally interpreted the imaging intraoperatively. Adequate needle placement confirmed in multiple planes. Appropriate spread of contrast into desired area was observed. No evidence of afferent or efferent intravascular uptake. Permanent images saved into the patient's record.  Post-operative Assessment:  Post-procedure Vital Signs:  Pulse/HCG Rate: 94  Temp: (!) 97.2 F (36.2 C) Resp: 16 BP: 136/86 SpO2: 98 %  EBL: None  Complications: No immediate post-treatment complications observed by team, or reported by patient.  Note: The patient tolerated the entire procedure well. A repeat set of vitals were taken after the procedure and the patient was kept under observation following  institutional policy, for this type of procedure. Post-procedural neurological assessment was performed, showing return to baseline, prior to discharge. The patient was provided with post-procedure discharge  instructions, including a section on how to identify potential problems. Should any problems arise concerning this procedure, the patient was given instructions to immediately contact us, at any time, without hesitation. In any case, we plan to contact the patient by telephone for a follow-up status report regarding this interventional procedure.  Comments:  No additional relevant information.  Plan of Care  Orders:  Orders Placed This Encounter  Procedures  . DG PAIN CLINIC C-ARM 1-60 MIN NO REPORT    Intraoperative interpretation by procedural physician at [place].    Standing Status:   Standing    Number of Occurrences:   1    Order Specific Question:   Reason for exam:    Answer:   Assistance in needle guidance and placement for procedures requiring needle placement in or near specific anatomical locations not easily accessible without such assistance.    Medications ordered for procedure: Meds ordered this encounter  Medications  . lidocaine (XYLOCAINE) 2 % (with pres) injection 400 mg  . ropivacaine (PF) 2 mg/mL (0.2%) (NAROPIN) injection 4 mL  . dexamethasone (DECADRON) injection 10 mg  . iohexol (OMNIPAQUE) 180 MG/ML injection 10 mL    Must be Myelogram-compatible. If not available, you may substitute with a water-soluble, non-ionic, hypoallergenic, myelogram-compatible radiological contrast medium.  Marland Kitchen oxyCODONE-acetaminophen (PERCOCET) 7.5-325 MG tablet    Sig: Take 1 tablet by mouth every 6 (six) hours as needed for severe pain. For chronic pain syndrome    Dispense:  120 tablet    Refill:  0   Medications administered: We administered lidocaine, ropivacaine (PF) 2 mg/mL (0.2%), dexamethasone, and iohexol.  See the medical record for exact dosing, route,  and time of administration.  Follow-up plan:   Return in about 1 month (around 01/13/2021) for Medication Management, Post Procedure Evaluation, in person.      Recent Visits Date Type Provider Dept  11/11/20 Office Visit Gillis Santa, MD [facility]  09/25/20 Office Visit Gillis Santa, MD [facility]  Showing recent visits within past 90 days and meeting all other requirements Today's Visits Date Type Provider Dept  12/11/20 Procedure visit Gillis Santa, MD [facility]  Showing today's visits and meeting all other requirements Future Appointments Date Type Provider Dept  01/09/21 Appointment Gillis Santa, MD [facility]  Showing future appointments within next 90 days and meeting all other requirements  Disposition: Discharge home  Discharge (Date  Time): 12/11/2020;   hrs.   Primary Care Physician: [name], MD Location: [facility] Note by: Gillis Santa, MD Date: 12/11/2020; Time: 12:25 PM  Disclaimer:  Medicine is not an exact science. The only guarantee in medicine is that nothing is guaranteed. It is important to note that the decision to proceed with this intervention was based on the information collected from the patient. The Data and conclusions were drawn from the patient's questionnaire, the interview, and the physical examination. Because the information was provided in large part by the patient, it cannot be guaranteed that it has not been purposely or unconsciously manipulated. Every effort has been made to obtain as much relevant data as possible for this evaluation. It is important to note that the conclusions that lead to this procedure are derived in large part from the available data. Always take into account that the treatment will also be dependent on availability of resources and existing treatment guidelines, considered by other Pain Management Practitioners as being common knowledge  and practice, at  the time of the intervention. For Medico-Legal purposes, it is also important to point out that variation in procedural techniques and pharmacological choices are the acceptable norm. The indications, contraindications, technique, and results of the above procedure should only be interpreted and judged by a Board-Certified Interventional Pain Specialist with extensive familiarity and expertise in the same exact procedure and technique.

## 2020-12-11 NOTE — Patient Instructions (Signed)

## 2020-12-12 ENCOUNTER — Telehealth: Payer: Self-pay

## 2020-12-12 DIAGNOSIS — Z79891 Long term (current) use of opiate analgesic: Secondary | ICD-10-CM | POA: Insufficient documentation

## 2020-12-12 DIAGNOSIS — D126 Benign neoplasm of colon, unspecified: Secondary | ICD-10-CM | POA: Insufficient documentation

## 2020-12-12 DIAGNOSIS — R140 Abdominal distension (gaseous): Secondary | ICD-10-CM | POA: Insufficient documentation

## 2020-12-12 DIAGNOSIS — K5909 Other constipation: Secondary | ICD-10-CM | POA: Insufficient documentation

## 2020-12-12 NOTE — Telephone Encounter (Signed)
Post procedure phone call.  No answer and no answering machine.  Unable to leave message.

## 2020-12-23 ENCOUNTER — Other Ambulatory Visit: Payer: Self-pay

## 2020-12-23 ENCOUNTER — Ambulatory Visit (INDEPENDENT_AMBULATORY_CARE_PROVIDER_SITE_OTHER): Payer: Medicare Other | Admitting: Urology

## 2020-12-23 ENCOUNTER — Encounter: Payer: Self-pay | Admitting: Urology

## 2020-12-23 VITALS — BP 177/90 | HR 166

## 2020-12-23 DIAGNOSIS — N3946 Mixed incontinence: Secondary | ICD-10-CM

## 2020-12-23 LAB — BLADDER SCAN AMB NON-IMAGING: Scan Result: 10
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
## 2020-12-23 NOTE — Patient Instructions (Signed)
Cystoscopy Cystoscopy is a procedure that is used to help diagnose and sometimes treat conditions that affect the lower urinary tract. The lower urinary tract includes the bladder and the urethra. The urethra is the tube that drains urine from the bladder. Cystoscopy is done using a thin, tube-shaped instrument with a light and camera at the end (cystoscope). The cystoscope may be hard or flexible, depending on the goal of the procedure. The cystoscope is inserted through the urethra, into the bladder. Cystoscopy may be recommended if you have:  Urinary tract infections that keep coming back.  Blood in the urine (hematuria).  An inability to control when you urinate (urinary incontinence) or an overactive bladder.  Unusual cells found in a urine sample.  A blockage in the urethra, such as a urinary stone.  Painful urination.  An abnormality in the bladder found during an intravenous pyelogram (IVP) or CT scan. Cystoscopy may also be done to remove a sample of tissue to be examined under a microscope (biopsy). What are the risks? Generally, this is a safe procedure. However, problems may occur, including:  Infection.  Bleeding.  What happens during the procedure?  1. You will be given one or more of the following: ? A medicine to numb the area (local anesthetic). 2. The area around the opening of your urethra will be cleaned. 3. The cystoscope will be passed through your urethra into your bladder. 4. Germ-free (sterile) fluid will flow through the cystoscope to fill your bladder. The fluid will stretch your bladder so that your health care provider can clearly examine your bladder walls. 5. Your doctor will look at the urethra and bladder. 6. The cystoscope will be removed The procedure may vary among health care providers  What can I expect after the procedure? After the procedure, it is common to have: 1. Some soreness or pain in your abdomen and urethra. 2. Urinary symptoms.  These include: ? Mild pain or burning when you urinate. Pain should stop within a few minutes after you urinate. This may last for up to 1 week. ? A small amount of blood in your urine for several days. ? Feeling like you need to urinate but producing only a small amount of urine. Follow these instructions at home: General instructions  Return to your normal activities as told by your health care provider.   Do not drive for 24 hours if you were given a sedative during your procedure.  Watch for any blood in your urine. If the amount of blood in your urine increases, call your health care provider.  If a tissue sample was removed for testing (biopsy) during your procedure, it is up to you to get your test results. Ask your health care provider, or the department that is doing the test, when your results will be ready.  Drink enough fluid to keep your urine pale yellow.  Keep all follow-up visits as told by your health care provider. This is important. Contact a health care provider if you:  Have pain that gets worse or does not get better with medicine, especially pain when you urinate.  Have trouble urinating.  Have more blood in your urine. Get help right away if you:  Have blood clots in your urine.  Have abdominal pain.  Have a fever or chills.  Are unable to urinate. Summary  Cystoscopy is a procedure that is used to help diagnose and sometimes treat conditions that affect the lower urinary tract.  Cystoscopy is done using   a thin, tube-shaped instrument with a light and camera at the end.  After the procedure, it is common to have some soreness or pain in your abdomen and urethra.  Watch for any blood in your urine. If the amount of blood in your urine increases, call your health care provider.  If you were prescribed an antibiotic medicine, take it as told by your health care provider. Do not stop taking the antibiotic even if you start to feel better. This  information is not intended to replace advice given to you by your health care provider. Make sure you discuss any questions you have with your health care provider. Document Revised: 09/13/2018 Document Reviewed: 09/13/2018 Elsevier Patient Education  2020 [employer]   

## 2020-12-24 ENCOUNTER — Encounter: Payer: Medicare Other | Admitting: Student in an Organized Health Care Education/Training Program

## 2020-12-25 ENCOUNTER — Ambulatory Visit
Admission: EM | Admit: 2020-12-25 | Discharge: 2020-12-25 | Disposition: A | Discharge status: Home / Self Care | Payer: Medicare Other | Attending: Emergency Medicine | Admitting: Emergency Medicine

## 2020-12-25 ENCOUNTER — Other Ambulatory Visit: Payer: Self-pay

## 2020-12-25 DIAGNOSIS — J22 Unspecified acute lower respiratory infection: Secondary | ICD-10-CM

## 2020-12-25 MED ORDER — ALBUTEROL SULFATE HFA 108 (90 BASE) MCG/ACT IN AERS: 1.0000 | INHALATION_SPRAY | Freq: Four times a day (QID) | RESPIRATORY_TRACT | 0 refills | Status: DC | PRN

## 2020-12-25 MED ORDER — AMOXICILLIN-POT CLAVULANATE 875-125 MG PO TABS: 1.0000 | ORAL_TABLET | Freq: Two times a day (BID) | ORAL | 0 refills | Status: DC

## 2020-12-25 MED ORDER — AZITHROMYCIN 250 MG PO TABS: ORAL_TABLET | ORAL | 0 refills | Status: DC

## 2020-12-25 MED ORDER — DM-GUAIFENESIN ER 30-600 MG PO TB12: 1.0000 | ORAL_TABLET | Freq: Two times a day (BID) | ORAL | 0 refills | Status: DC

## 2020-12-25 MED ORDER — PREDNISONE 20 MG PO TABS: 40.0000 mg | ORAL_TABLET | Freq: Every day | ORAL | 0 refills | Status: AC

## 2020-12-25 MED ORDER — BENZONATATE 200 MG PO CAPS: 200.0000 mg | ORAL_CAPSULE | Freq: Three times a day (TID) | ORAL | 0 refills | Status: AC | PRN

## 2020-12-25 NOTE — ED Provider Notes (Signed)
[facility]    CSN: [id number] Arrival date & time: 12/25/20  4097      History   Chief Complaint Chief Complaint  Patient presents with  . Cough    HPI Fatime Biswell is a 70 y.o. female history of hypertension, hyperlipidemia, GERD presenting today for evaluation of congestion.  Reports last night had flare of underlying GERD and reported significant burning sensation in her throat with slight cough.  Today woke up with significant chest congestion and increased cough with some shortness of breath and chest tightness.  Denies any fevers.  Reflux has improved. HPI  Past Medical History:  Diagnosis Date  . Allergy    seasonal  . Arthritis   . Depression   . GERD (gastroesophageal reflux disease)   . Hyperlipidemia   . Hypertension   . Iron deficiency anemia 11/01/2019  . Neuromuscular disorder ([place])    peripheral neuropathy/feet  . Sleep apnea    CPAP     Patient Active Problem List   Diagnosis Date Noted  . Chronic right shoulder pain 11/11/2020  . Right rotator cuff tear arthropathy 11/11/2020  . 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 Chronic radicular lumbar pain 12/04/2019  . Iron deficiency anemia 11/01/2019  . Primary osteoarthritis of left knee 10/17/2019  . Primary osteoarthritis of right shoulder 10/17/2019  . Hx of cervical spine surgery 08/21/2019  . Pancolitis ([place]) 07/20/2019  . Kyphosis of cervical region 07/14/2019  . Cervical spondylosis with myelopathy 06/28/2019  . Chronic low back pain 06/28/2019  . Cervical spondylosis without myelopathy 11/09/2018  . Foraminal stenosis of cervical region 11/09/2018  . Cervical stenosis of spinal canal 11/09/2018  . History of fusion of lumbar spine 11/09/2018  . 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 Postlaminectomy syndrome of lumbosacral region 11/09/2018  . Chronic pain syndrome 11/09/2018  . Severe obesity (BMI 35.0-39.9) with comorbidity ([place])  08/18/2018  . Primary osteoarthritis of right knee 02/17/2017  . Hyperlipidemia 12/02/2016  . Incontinence in female 10/05/2016  . Spondylolisthesis, lumbar region 05/05/2016  . 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  . OSA (obstructive sleep apnea) 05/31/2015  . Hypothyroidism 02/09/2014  . Sleep disorder 02/09/2014  . Asymptomatic hyperuricemia 08/29/2013  . Polyarthritis 08/29/2013  . Essential hypertension 06/28/2013  . SUI (stress urinary incontinence, female) 06/20/2013  . Insomnia 05/22/2013  . 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  . Peripheral neuropathy 05/25/2012  . Venous (peripheral) insufficiency 05/25/2012  . Depression 10/03/2009  . Restless legs syndrome (RLS) 10/03/2009  . Fibromyalgia 02/18/2009  . Gastro-esophageal reflux disease with esophagitis 10/23/2008  . Generalized anxiety disorder 10/23/2008  . GERD (gastroesophageal reflux disease) 10/23/2008  . Pure hypercholesterolemia 10/23/2008    Past Surgical History:  Procedure Laterality Date  . 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 COLONOSCOPY WITH PROPOFOL N/A 11/10/2019   Procedure: COLONOSCOPY WITH PROPOFOL;  Surgeon: Jonathon Bellows, MD;  Location: [facility];  Service: Endoscopy;  Laterality: N/A;  . ESOPHAGOGASTRODUODENOSCOPY (EGD) WITH PROPOFOL N/A 11/10/2019   Procedure: ESOPHAGOGASTRODUODENOSCOPY (EGD) WITH PROPOFOL;  Surgeon: Jonathon Bellows, MD;  Location: [facility];  Service: Endoscopy;  Laterality: N/A;  . EXTRACORPOREAL SHOCK WAVE LITHOTRIPSY Right 04/27/2019   Procedure: EXTRACORPOREAL SHOCK WAVE LITHOTRIPSY (ESWL);  Surgeon: Billey Co, MD;  Location: [facility];  Service: Urology;  Laterality: Right;  . JOINT REPLACEMENT Right    knee  . KNEE ARTHROPLASTY Left 01/12/2020   Procedure: COMPUTER ASSISTED TOTAL KNEE ARTHROPLASTY;  Surgeon: Dereck Leep, MD;  Location: [facility];  Service: Orthopedics;  Laterality: Left;  . POLYPECTOMY  11/10/2019   Procedure: POLYPECTOMY;  Surgeon: Jonathon Bellows, MD;   Location: [facility];  Service: Endoscopy;;  . SPINAL FUSION  07/10/2019   C4-5 corpectomy with C6-7 ACDF, C2-T3 spinal fusion  . SPINE SURGERY     spinal fusion  . TUBAL LIGATION      OB History   No obstetric history on file.      Home Medications    Prior to Admission medications   Medication Sig Start Date End Date Taking? Authorizing Provider  albuterol (VENTOLIN HFA) 108 (90 Base) MCG/ACT inhaler Inhale 1-2 puffs into the lungs every 6 (six) hours as needed for wheezing or shortness of breath. 12/25/20  Yes Vu Liebman C, PA-C  amoxicillin-clavulanate (AUGMENTIN) 875-125 MG tablet Take 1 tablet by mouth every 12 (twelve) hours. With food 12/25/20  Yes Theressa Piedra C, PA-C  azithromycin (ZITHROMAX) 250 MG tablet Take first 2 tablets together, then 1 every day until finished. 12/25/20  Yes Isai Gottlieb C, PA-C  benzonatate (TESSALON) 200 MG capsule Take 1 capsule (200 mg total) by mouth 3 (three) times daily as needed for up to 7 days for cough. 12/25/20 01/01/21 Yes Keegan Ducey C, PA-C  dextromethorphan-guaiFENesin (MUCINEX DM) 30-600 MG 12hr tablet Take 1 tablet by mouth 2 (two) times daily. 12/25/20  Yes Romey Mathieson C, PA-C  predniSONE (DELTASONE) 20 MG tablet Take 2 tablets (40 mg total) by mouth daily with breakfast for 4 days. 12/25/20 12/29/20 Yes Anabeth Chilcott C, PA-C  Alpha-Lipoic Acid 600 MG CAPS Take 1,200 mg by mouth daily.     [provider]  amLODipine (NORVASC) 10 MG tablet Take 10 mg by mouth daily.    [provider]  atorvastatin (LIPITOR) 40 MG tablet Take 40 mg by mouth daily.    [provider]  Cholecalciferol (VITAMIN D3) 25 MCG (1000 UT) CAPS Take 1,000 Units by mouth daily.     [provider]  gabapentin (NEURONTIN) 800 MG tablet Take 1 tablet (800 mg total) by mouth 4 (four) times daily. 04/02/20   Gillis Santa, MD  lisinopril (PRINIVIL,ZESTRIL) 40 MG tablet Take 40 mg by mouth daily.    [provider]  oxyCODONE-acetaminophen (PERCOCET) 7.5-325 MG tablet Take 1 tablet by mouth every 6 (six) hours as needed for severe pain. For chronic pain syndrome 12/14/20 01/13/21  Gillis Santa, MD  senna-docusate (SENOKOT-S) 8.6-50 MG tablet Take 1 tablet by mouth 2 (two) times daily. 01/14/20   Duanne Guess, PA-C  venlafaxine XR (EFFEXOR-XR) 150 MG 24 hr capsule Take 150 mg by mouth daily with breakfast.  10/26/19   [provider]  vitamin B-12 (CYANOCOBALAMIN) 1000 MCG tablet Take 1 tablet (1,000 mcg total) by mouth daily. 11/01/19   Earlie Server, MD    Family History Family History  Problem Relation Age of Onset  . Hypertension Mother   . Hyperlipidemia Sister   . Hypertension Sister   . Depression Sister   . Depression Brother     Social History Social History   Tobacco Use  . Smoking status: Former Smoker    Quit date: 10/29/2004    Years since quitting: 16.1  . Smokeless tobacco: Never Used  Vaping Use  . Vaping Use: Never used  Substance Use Topics  . Alcohol use: Yes    Comment: rarely has a glass of wine  . Drug use: Never     Allergies  Contrast media [iodinated diagnostic agents] and Cephalexin   Review of Systems Review of Systems  Constitutional: Negative for activity change, appetite change, chills, fatigue and fever.  HENT: Positive for congestion and sore throat. Negative for ear pain, rhinorrhea, sinus pressure and trouble swallowing.   Eyes: Negative for discharge and redness.  Respiratory: Positive for cough. Negative for chest tightness and shortness of breath.   Cardiovascular: Negative for chest pain.  Gastrointestinal: Negative for abdominal pain, diarrhea, nausea and vomiting.  Musculoskeletal: Negative for myalgias.  Skin: Negative for rash.  Neurological: Negative for dizziness, light-headedness and headaches.     Physical Exam Triage Vital Signs ED Triage Vitals  Enc Vitals Group     BP      Pulse      Resp      Temp       Temp src      SpO2      Weight      Height      Head Circumference      Peak Flow      Pain Score      Pain Loc      Pain Edu?      Excl. in [place]?    No data found.  Updated Vital Signs BP (!) 155/86 (BP Location: Left Arm)   Pulse 96   Temp 99 F (37.2 C) (Oral)   Resp 18   SpO2 95%   Visual Acuity Right Eye Distance:   Left Eye Distance:   Bilateral Distance:    Right Eye Near:   Left Eye Near:    Bilateral Near:     Physical Exam Vitals and nursing note reviewed.  Constitutional:      Appearance: She is well-developed.     Comments: No acute distress  HENT:     Head: Normocephalic and atraumatic.     Ears:     Comments: Bilateral ears without tenderness to palpation of external auricle, tragus and mastoid, EAC's without erythema or swelling, TM's with good bony landmarks and cone of light. Non erythematous.     Nose: Nose normal.     Mouth/Throat:     Comments: Oral mucosa pink and moist, no tonsillar enlargement or exudate. Posterior pharynx patent and nonerythematous, no uvula deviation or swelling. Normal phonation. Eyes:     Conjunctiva/sclera: Conjunctivae normal.  Cardiovascular:     Rate and Rhythm: Normal rate and regular rhythm.  Pulmonary:     Effort: Pulmonary effort is normal. No respiratory distress.     Comments: Breathing comfortably at rest, inspiratory and expiratory wheezing rhonchi noted throughout bilateral lung fields Abdominal:     General: There is no distension.  Musculoskeletal:        General: Normal range of motion.     Cervical back: Neck supple.  Skin:    General: Skin is warm and dry.  Neurological:     Mental Status: She is alert and oriented to person, place, and time.      UC Treatments / Results  Labs (all labs ordered are listed, but only abnormal results are displayed) Labs Reviewed - No data to display  EKG   Radiology No results found.  Procedures Procedures (including critical care time)  Medications  Ordered in UC Medications - No data to display  Initial Impression / Assessment and Plan / UC Course  I have reviewed the triage vital signs and the nursing notes.  Pertinent labs & imaging results that were available during my  care of the patient were reviewed by me and considered in my medical decision making (see chart for details).     Lower respiratory infection-exam consistent with bronchitis, opting to go ahead and cover pneumonia as well given concern for possible aspiration from reflux.  Placing on Augmentin and azithromycin, prednisone and albuterol, continue symptomatic and supportive care of cough and congestion as well and close monitoring.  Discussed strict return precautions. Patient verbalized understanding and is agreeable with plan.  Final Clinical Impressions(s) / UC Diagnoses   Final diagnoses:  Lower respiratory infection (e.g., bronchitis, pneumonia, pneumonitis, pulmonitis)     Discharge Instructions     Begin Augmentin and azithromycin courses to cover infection in lungs Prednisone 80 mg daily with food for the next 3 to 4 days as tolerating Albuterol inhaler 1 to 2 puffs every 4-6 hours as needed for shortness of breath chest tightness and wheezing May use Tessalon/benzonatate every 8 hours for cough or other over-the-counter cough medicine-Robitussin, Delsym Mucinex DM twice daily to further help with cough and congestion-may get over-the-counter as well if cheaper  Follow-up if not improving or worsening    ED Prescriptions    Medication Sig Dispense Auth. Provider   amoxicillin-clavulanate (AUGMENTIN) 875-125 MG tablet Take 1 tablet by mouth every 12 (twelve) hours. With food 14 tablet Brena Windsor C, PA-C   azithromycin (ZITHROMAX) 250 MG tablet Take first 2 tablets together, then 1 every day until finished. 6 tablet Darien Mignogna C, PA-C   benzonatate (TESSALON) 200 MG capsule Take 1 capsule (200 mg total) by mouth 3 (three) times daily as  needed for up to 7 days for cough. 28 capsule Austynn Pridmore C, PA-C   dextromethorphan-guaiFENesin (MUCINEX DM) 30-600 MG 12hr tablet Take 1 tablet by mouth 2 (two) times daily. 20 tablet Jacquelina Hewins C, PA-C   albuterol (VENTOLIN HFA) 108 (90 Base) MCG/ACT inhaler Inhale 1-2 puffs into the lungs every 6 (six) hours as needed for wheezing or shortness of breath. 8 g Mellanie Bejarano C, PA-C   predniSONE (DELTASONE) 20 MG tablet Take 2 tablets (40 mg total) by mouth daily with breakfast for 4 days. 8 tablet Raynard Mapps, [place] C, PA-C     PDMP not reviewed this encounter.   Joneen Caraway [place] C, PA-C 12/25/20 1052

## 2020-12-25 NOTE — ED Triage Notes (Signed)
Pt states had bad reflux after eating dinner last night and had a sore throat. States this morning having a bad cough with chest congestion. States reflux is better.

## 2020-12-25 NOTE — Discharge Instructions (Signed)
Begin Augmentin and azithromycin courses to cover infection in lungs Prednisone 80 mg daily with food for the next 3 to 4 days as tolerating Albuterol inhaler 1 to 2 puffs every 4-6 hours as needed for shortness of breath chest tightness and wheezing May use Tessalon/benzonatate every 8 hours for cough or other over-the-counter cough medicine-Robitussin, Delsym Mucinex DM twice daily to further help with cough and congestion-may get over-the-counter as well if cheaper  Follow-up if not improving or worsening

## 2020-12-26 LAB — CULTURE, URINE COMPREHENSIVE

## 2021-01-09 ENCOUNTER — Encounter: Payer: Self-pay | Admitting: Student in an Organized Health Care Education/Training Program

## 2021-01-09 ENCOUNTER — Ambulatory Visit
Payer: Medicare Other | Attending: Student in an Organized Health Care Education/Training Program | Admitting: Student in an Organized Health Care Education/Training Program

## 2021-01-09 ENCOUNTER — Other Ambulatory Visit: Payer: Self-pay

## 2021-01-09 VITALS — BP 125/73 | HR 106 | Temp 97.3°F | Ht 64.0 in | Wt 200.0 lb

## 2021-01-09 DIAGNOSIS — G8929 Other chronic pain: Secondary | ICD-10-CM | POA: Insufficient documentation

## 2021-01-09 DIAGNOSIS — G894 Chronic pain syndrome: Secondary | ICD-10-CM | POA: Diagnosis present

## 2021-01-09 DIAGNOSIS — M48062 Spinal stenosis, lumbar region with neurogenic claudication: Secondary | ICD-10-CM | POA: Diagnosis present

## 2021-01-09 DIAGNOSIS — Z981 Arthrodesis status: Secondary | ICD-10-CM

## 2021-01-09 DIAGNOSIS — M75101 Unspecified rotator cuff tear or rupture of right shoulder, not specified as traumatic: Secondary | ICD-10-CM

## 2021-01-09 DIAGNOSIS — M1712 Unilateral primary osteoarthritis, left knee: Secondary | ICD-10-CM

## 2021-01-09 DIAGNOSIS — M19011 Primary osteoarthritis, right shoulder: Secondary | ICD-10-CM

## 2021-01-09 DIAGNOSIS — M961 Postlaminectomy syndrome, not elsewhere classified: Secondary | ICD-10-CM | POA: Diagnosis present

## 2021-01-09 DIAGNOSIS — M5416 Radiculopathy, lumbar region: Secondary | ICD-10-CM | POA: Insufficient documentation

## 2021-01-09 DIAGNOSIS — M12811 Other specific arthropathies, not elsewhere classified, right shoulder: Secondary | ICD-10-CM | POA: Diagnosis present

## 2021-01-09 DIAGNOSIS — M25511 Pain in right shoulder: Secondary | ICD-10-CM | POA: Insufficient documentation

## 2021-01-09 MED ORDER — OXYCODONE-ACETAMINOPHEN 10-325 MG PO TABS: 1.0000 | ORAL_TABLET | Freq: Four times a day (QID) | ORAL | 0 refills | Status: DC | PRN

## 2021-01-09 MED ORDER — GABAPENTIN 800 MG PO TABS: 800.0000 mg | ORAL_TABLET | Freq: Four times a day (QID) | ORAL | 3 refills | Status: DC

## 2021-01-09 NOTE — Progress Notes (Signed)
Nursing Pain Medication Assessment:  Safety precautions to be maintained throughout the outpatient stay will include: orient to surroundings, keep bed in low position, maintain call bell within reach at all times, provide assistance with transfer out of bed and ambulation.  Medication Inspection Compliance: Lisa Good did not comply with our request to bring her pills to be counted. She was reminded that bringing the medication bottles, even when empty, is a requirement.  Medication: None brought in. Pill/Patch Count: None available to be counted. Bottle Appearance: No container available. Did not bring bottle(s) to appointment. Filled Date: N/A Last Medication intake:  TodaySafety precautions to be maintained throughout the outpatient stay will include: orient to surroundings, keep bed in low position, maintain call bell within reach at all times, provide assistance with transfer out of bed and ambulation.

## 2021-01-09 NOTE — Progress Notes (Signed)
PROVIDER NOTE: Information contained herein reflects review and annotations entered in association with encounter. Interpretation of such information and data should be left to medically-trained personnel. Information provided to patient can be located elsewhere in the medical record under "Patient Instructions". Document created using STT-dictation technology, any transcriptional errors that may result from process are unintentional.    Patient: Lisa Good  Service Category: E/M  Provider: Gillis Santa, MD  DOB: Oct 27, 1950  DOS: 01/09/2021  Specialty: Interventional Pain Management  MRN: [id number]  Setting: Ambulatory outpatient  PCP: [name], MD  Type: Established Patient    Referring Provider: [name], MD  Location: Office  Delivery: Face-to-face     HPI  Ms. Bobetta Korf, a 70 y.o. year old female, is here today because of her Primary osteoarthritis of right shoulder [M19.011]. Ms. Treloar primary complain today is Back Pain Last encounter: My last encounter with her was on 12/11/2020. Pertinent problems: Ms. Granderson has Cervical spondylosis without myelopathy; Foraminal stenosis of cervical region; Cervical stenosis of spinal canal; History of fusion of lumbar spine; Spinal stenosis of lumbar region with neurogenic claudication; Postlaminectomy syndrome of lumbosacral region; Chronic pain syndrome; Hx of cervical spine surgery; Primary osteoarthritis of left knee; Primary osteoarthritis of right shoulder; Chronic radicular lumbar pain; Cervical spondylosis with myelopathy; Kyphosis of cervical region; Fibromyalgia; Spondylolisthesis, lumbar region; and Primary osteoarthritis of right knee on their pertinent problem list. Pain Assessment: Severity of Chronic pain is reported as a 7 /10. Location: Back Lower/Denies. Onset: More than a month ago. Quality: Sharp,Constant,Aching (right shoulder). Timing: Constant. Modifying factor(s): meds, rest. Vitals:  height is _0  (1.626  m) and weight is 200 lb (90.7 kg). Her temperature is 97.3 F (36.3 C) (abnormal). Her blood pressure is 125/73 and her pulse is 106 (abnormal). Her oxygen saturation is 100%.   Reason for encounter: both, medication management and post-procedure assessment.    Patient presents today for medication management and postprocedural evaluation.  She states that that the right suprascapular nerve block helped out with her right shoulder pain and range of motion.  She is having increased low back pain.  She states that this is very painful.  Unfortunately, the patient is unable to afford belbuca which she was on previously that was effective in managing her chronic pain.  We attempted to transition her from belbuca to [facility] which is extended release oxycodone however this again was very expensive for her.  She was unable to get this filled.  As result, we will modify her Percocet from 7.5 mg every 6 hours as needed to 10 mg every 6 as needed since the patient is unable to afford any of the long-acting analgesics I have recommended.  Can repeat suprascapular nerve block in future if return of pain consider pulsed radiofrequency ablation for the right suprascapular nerve.  This is the fourth time that the patient did not bring in her prescription bottles.  This is her last warning.  I informed her that if she does not bring her medications at every visit that I would not be able to continue chronic opioid therapy for her.  This is clear in the pain contract that she has signed.  Post-Procedure Evaluation  Procedure (12/11/2020):   Type: Diagnostic Suprascapular nerve Block #1  Primary Purpose: Diagnostic Region: Posterior Shoulder & Scapular Areas Level: Superior to the scapular spine, in the lateral aspect of the supraspinatus fossa (Suprescapular notch). Target Area: Suprascapular nerve as it passes thru the lower portion of  the suprascapular notch. Approach: Posterior percutaneous  approach. Laterality: Right-Side  Sedation: Please see nurses note.  Effectiveness during initial hour after procedure(Ultra-Short Term Relief): 100 %   Local anesthetic used: Long-acting (4-6 hours) Effectiveness: Defined as any analgesic benefit obtained secondary to the administration of local anesthetics. This carries significant diagnostic value as to the etiological location, or anatomical origin, of the pain. Duration of benefit is expected to coincide with the duration of the local anesthetic used.  Effectiveness during initial 4-6 hours after procedure(Short-Term Relief): 100 %   Long-term benefit: Defined as any relief past the pharmacologic duration of the local anesthetics.  Effectiveness past the initial 6 hours after procedure(Long-Term Relief): 75 % (ongoing)   Current benefits: Defined as benefit that persist at this time.   Analgesia:  >50% relief   Pharmacotherapy Assessment   Analgesic: Percocet 10 mg every 6 hours as needed, quantity 120/month; MME equals 60 Monitoring: [place] PMP: PDMP not reviewed this encounter.       Pharmacotherapy: No side-effects or adverse reactions reported. Compliance: No problems identified. Effectiveness: Clinically acceptable.  Chauncey Fischer, RN  01/09/2021  9:47 AM  Sign when Signing Visit Nursing Pain Medication Assessment:  Safety precautions to be maintained throughout the outpatient stay will include: orient to surroundings, keep bed in low position, maintain call bell within reach at all times, provide assistance with transfer out of bed and ambulation.  Medication Inspection Compliance: Ms. Shepardson did not comply with our request to bring her pills to be counted. She was reminded that bringing the medication bottles, even when empty, is a requirement.  Medication: None brought in. Pill/Patch Count: None available to be counted. Bottle Appearance: No container available. Did not bring bottle(s) to appointment. Filled Date: N/A Last  Medication intake:  TodaySafety precautions to be maintained throughout the outpatient stay will include: orient to surroundings, keep bed in low position, maintain call bell within reach at all times, provide assistance with transfer out of bed and ambulation.     UDS:  Summary  Date Value Ref Range Status  05/06/2020 Note  Final    Comment:    ==================================================================== [employer] Select 13 (MW) ==================================================================== Test                             Result       Flag       Units  Drug Present and Declared for Prescription Verification   Noroxycodone                   95           EXPECTED   ng/mg creat    Noroxycodone is an expected metabolite of oxycodone. Sources of    oxycodone include scheduled prescription medications.    Buprenorphine                  13           EXPECTED   ng/mg creat   Norbuprenorphine               110          EXPECTED   ng/mg creat    Source of buprenorphine is a scheduled prescription medication.    Norbuprenorphine is an expected metabolite of buprenorphine.  Drug Absent but Declared for Prescription Verification   Oxycodone  Not Detected UNEXPECTED ng/mg creat    Oxycodone is almost always present in patients taking this drug    consistently.  Absence of oxycodone could be due to lapse of time    since the last dose or unusual pharmacokinetics (rapid metabolism).  ==================================================================== Test                      Result    Flag   Units      Ref Range   Creatinine              62               mg/dL      >=20 ==================================================================== Declared Medications:  The flagging and interpretation on this report are based on the  following declared medications.  Unexpected results may arise from  inaccuracies in the declared medications.   **Note: The testing  scope of this panel includes these medications:   Oxycodone (Percocet)   **Note: The testing scope of this panel does not include small to  moderate amounts of these reported medications:   Buprenorphine (Belbuca)   **Note: The testing scope of this panel does not include the  following reported medications:   Acetaminophen (Percocet)  Amlodipine  Atorvastatin  Cholecalciferol  Cyanocobalamin  Docusate  Enoxaparin (Lovenox)  Gabapentin  Lisinopril  Pantoprazole (Protonix)  Sennosides  Supplement  Venlafaxine ==================================================================== For clinical consultation, please call [phone]. ====================================================================      ROS  Constitutional: Denies any fever or chills Gastrointestinal: No reported hemesis, hematochezia, vomiting, or acute GI distress Musculoskeletal: Low back pain Neurological: No reported episodes of acute onset apraxia, aphasia, dysarthria, agnosia, amnesia, paralysis, loss of coordination, or loss of consciousness  Medication Review  Alpha-Lipoic Acid, Vitamin D3, albuterol, amLODipine, atorvastatin, gabapentin, lisinopril, oxyCODONE-acetaminophen, senna-docusate, venlafaxine XR, and vitamin B-12  History Review  Allergy: Ms. Enneking is allergic to contrast media [iodinated diagnostic agents] and cephalexin. Drug: Ms. Seeber  reports no history of drug use. Alcohol:  reports current alcohol use. Tobacco:  reports that she quit smoking about 16 years ago. She has never used smokeless tobacco. Social: Ms. Artley  reports that she quit smoking about 16 years ago. She has never used smokeless tobacco. She reports current alcohol use. She reports that she does not use drugs. Medical:  has a past medical history of Allergy, Arthritis, Depression, GERD (gastroesophageal reflux disease), Hyperlipidemia, Hypertension, Iron deficiency anemia (11/01/2019), Neuromuscular disorder  ([place]), and Sleep apnea. Surgical: Ms. Hulen  has a past surgical history that includes Spine surgery; Joint replacement (Right); Tubal ligation; Brain surgery (2002); Extracorporeal shock wave lithotripsy (Right, 04/27/2019); Spinal fusion (07/10/2019); Colonoscopy with propofol (N/A, 11/10/2019); Esophagogastroduodenoscopy (egd) with propofol (N/A, 11/10/2019); polypectomy (11/10/2019); and Knee Arthroplasty (Left, 01/12/2020). Family: family history includes Depression in her brother and sister; Hyperlipidemia in her sister; Hypertension in her mother and sister.  Laboratory Chemistry Profile   Renal Lab Results  Component Value Date   BUN 26 (H) 01/02/2020   CREATININE 1.02 (H) 01/02/2020   GFRAA >60 01/02/2020   GFRNONAA 56 (L) 01/02/2020     Hepatic Lab Results  Component Value Date   AST 19 01/02/2020   ALT 13 01/02/2020   ALBUMIN 3.8 01/02/2020   ALKPHOS 95 01/02/2020     Electrolytes Lab Results  Component Value Date   NA 140 01/02/2020   K 4.4 01/02/2020   CL 102 01/02/2020   CALCIUM 9.3 01/02/2020   MG 2.1 07/21/2019  PHOS 3.4 07/21/2019     Bone No results found for: VD25OH, [id number], [id number], [id number], 25OHVITD1, 25OHVITD2, 25OHVITD3, TESTOFREE, TESTOSTERONE   Inflammation (CRP: Acute Phase) (ESR: Chronic Phase) Lab Results  Component Value Date   CRP 0.7 01/02/2020   ESRSEDRATE 43 (H) 01/02/2020   LATICACIDVEN 1.4 07/20/2019       Note: Above Lab results reviewed.  Physical Exam  General appearance: Well nourished, well developed, and well hydrated. In no apparent acute distress Mental status: Alert, oriented x 3 (person, place, & time)       Respiratory: No evidence of acute respiratory distress Eyes: PERLA Vitals: BP 125/73   Pulse (!) 106   Temp (!) 97.3 F (36.3 C)   Ht _0  (1.626 m)   Wt 200 lb (90.7 kg)   SpO2 100%   BMI 34.33 kg/m  BMI: Estimated body mass index is 34.33 kg/m as calculated from the following:   Height as of this  encounter: _1  (1.626 m).   Weight as of this encounter: 200 lb (90.7 kg). Ideal: Ideal body weight: 54.7 kg (120 lb 9.5 oz) Adjusted ideal body weight: 69.1 kg (152 lb 5.7 oz)  Right shoulder pain improved, improved right shoulder range of motion.  Arthropathic pain pattern Limited cervical range of motion.  Neuropathic pain of bilateral upper extremity Lumbar scoliosis.  Surgical scar present lumbar spine.  Pain with facet loading and lateral rotation.  Positive straight leg raise test 5 out of 5 strength bilateral lower extremity: Plantar flexion, dorsiflexion, knee flexion, knee extension.   Assessment   Diagnosis  1. Primary osteoarthritis of right shoulder   2. Right rotator cuff tear arthropathy   3. Chronic radicular lumbar pain   4. Postlaminectomy syndrome of lumbosacral region   5. History of fusion of lumbar spine   6. Primary osteoarthritis of left knee   7. Spinal stenosis of lumbar region with neurogenic claudication   8. Chronic pain syndrome   9. Chronic right shoulder pain       Plan of Care  Ms. Kataleena Ballinas has a current medication list which includes the following long-term medication(s): albuterol, amlodipine, atorvastatin, lisinopril, and gabapentin.  Pharmacotherapy (Medications Ordered): Meds ordered this encounter  Medications  . oxyCODONE-acetaminophen (PERCOCET) 10-325 MG tablet    Sig: Take 1 tablet by mouth every 6 (six) hours as needed for pain.    Dispense:  120 tablet    Refill:  0    For chronic pain syndrome  . oxyCODONE-acetaminophen (PERCOCET) 10-325 MG tablet    Sig: Take 1 tablet by mouth every 6 (six) hours as needed for pain.    Dispense:  120 tablet    Refill:  0    For chronic pain syndrome  . oxyCODONE-acetaminophen (PERCOCET) 10-325 MG tablet    Sig: Take 1 tablet by mouth every 6 (six) hours as needed for pain.    Dispense:  120 tablet    Refill:  0    For chronic pain syndrome  . gabapentin (NEURONTIN) 800 MG tablet     Sig: Take 1 tablet (800 mg total) by mouth 4 (four) times daily.    Dispense:  120 tablet    Refill:  3   Orders Placed This Encounter  Procedures  . SUPRASCAPULAR NERVE BLOCK    CLINICAL INDICATIONS: For shoulder pain.    Standing Status:   Standing    Number of Occurrences:   1    Standing Expiration Date:   01/09/2022  Scheduling Instructions:     Purpose: Diagnostic     Laterality: Bilateral     Level(s): Suprascapular notch     Sedation: Patient's choice.     TIMEFRAME: PRN procedure. (Ms. Bilger will call when needed.)    Order Specific Question:   Where will this procedure be performed?    Answer:   [facility]     Follow-up plan:   Return in about 3 months (around 04/10/2021) for Medication Management, in person.   Recent Visits Date Type Provider Dept  12/11/20 Procedure visit Gillis Santa, MD [facility]  11/11/20 Office Visit Gillis Santa, MD [facility]  Showing recent visits within past 90 days and meeting all other requirements Today's Visits Date Type Provider Dept  01/09/21 Office Visit Gillis Santa, MD [facility]  Showing today's visits and meeting all other requirements Future Appointments No visits were found meeting these conditions. Showing future appointments within next 90 days and meeting all other requirements  I discussed the assessment and treatment plan with the patient. The patient was provided an opportunity to ask questions and all were answered. The patient agreed with the plan and demonstrated an understanding of the instructions.  Patient advised to call back or seek an in-person evaluation if the symptoms or condition worsens.  Duration of encounter: 30 minutes.  Note by: Gillis Santa, MD Date: 01/09/2021; Time: 10:21 AM

## 2021-01-13 ENCOUNTER — Encounter: Payer: Self-pay | Admitting: Urology

## 2021-01-13 ENCOUNTER — Other Ambulatory Visit: Payer: Self-pay

## 2021-01-13 ENCOUNTER — Ambulatory Visit (INDEPENDENT_AMBULATORY_CARE_PROVIDER_SITE_OTHER): Payer: Medicare Other | Admitting: Urology

## 2021-01-13 VITALS — BP 164/79 | HR 108 | Ht 64.0 in | Wt 200.0 lb

## 2021-01-13 DIAGNOSIS — N3946 Mixed incontinence: Secondary | ICD-10-CM

## 2021-01-13 NOTE — Progress Notes (Signed)
01/13/2021 9:19 AM   Lisa Good 1951/07/11 [id number]  Referring provider: [name], MD [facility] [place]  [place] [address]  Chief Complaint  Patient presents with  . Cysto    HPI: I was consulted to assess the patient is urinary incontinence. She has had 2 synthetic slings and one pubovaginal sling in [place]. She might leak with coughing sneezing with a hard cough but otherwise has no stress incontinence. She is urge incontinence worse when she goes from a supine to standing position she can soak a pad. She wears a 2 pads a day. The positional changes significant trigger. No bedwetting  She voids every 3-4 hours gets up twice at night  She recently had neck surgery and needs another back operation. She has had 2 bladder infections in the last 6 months.  By history she recently failed Ditropan and Myrbetriq     well supported bladder neck and negative cough test. Patient had about a 5 cm vaginal length. 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 I felt some scarring near the cuff. She had no stress incontinence  Likely patient likely has primarily an overactive bladder with triggering and possibly mild stress incontinence. The role of urodynamics and cystoscopy discussed. She has had a recent bladder infections.  Today Frequency stable.  Patient had back surgery and did not follow-up.  Last appointment more than a year ago.  She has had surgery on her neck mid back and low back.  She has urge incontinence.  Main symptom is she has high-volume leakage when she goes from a supine to standing position.  She leaks a small amount with coughing sneezing.  No bedwetting.  She wears 2 pads a day with a varying amount of leakage  Voids every 2-3 hours gets up 3-4 times a night.  Patient has mixed incontinence.  Role of urodynamics and repeat pelvic examination and cystoscopy discussed.  Call if urine culture  positive  TOday Frequency stable.  Incontinence stable.  Last culture negative Cystoscopy: Patient underwent flexible cystoscopy.  Bladder mucosa and trigone were normal.  No foreign body.  No carcinoma.  Urethra normal. Patient will follow up for urodynamics    PMH: Past Medical History:  Diagnosis Date  . Allergy    seasonal  . Arthritis   . Depression   . GERD (gastroesophageal reflux disease)   . Hyperlipidemia   . Hypertension   . Iron deficiency anemia 11/01/2019  . Neuromuscular disorder ([place])    peripheral neuropathy/feet  . Sleep apnea    CPAP     Surgical History: Past Surgical History:  Procedure Laterality Date  . 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 COLONOSCOPY WITH PROPOFOL N/A 11/10/2019   Procedure: COLONOSCOPY WITH PROPOFOL;  Surgeon: Jonathon Bellows, MD;  Location: [facility];  Service: Endoscopy;  Laterality: N/A;  . ESOPHAGOGASTRODUODENOSCOPY (EGD) WITH PROPOFOL N/A 11/10/2019   Procedure: ESOPHAGOGASTRODUODENOSCOPY (EGD) WITH PROPOFOL;  Surgeon: Jonathon Bellows, MD;  Location: [facility];  Service: Endoscopy;  Laterality: N/A;  . EXTRACORPOREAL SHOCK WAVE LITHOTRIPSY Right 04/27/2019   Procedure: EXTRACORPOREAL SHOCK WAVE LITHOTRIPSY (ESWL);  Surgeon: Billey Co, MD;  Location: [facility];  Service: Urology;  Laterality: Right;  . JOINT REPLACEMENT Right    knee  . KNEE ARTHROPLASTY Left 01/12/2020   Procedure: COMPUTER ASSISTED TOTAL KNEE ARTHROPLASTY;  Surgeon: Dereck Leep, MD;  Location: [facility];  Service: Orthopedics;  Laterality: Left;  .  POLYPECTOMY  11/10/2019   Procedure: POLYPECTOMY;  Surgeon: Jonathon Bellows, MD;  Location: [facility];  Service: Endoscopy;;  . SPINAL FUSION  07/10/2019   C4-5 corpectomy with C6-7 ACDF, C2-T3 spinal fusion  . SPINE SURGERY     spinal fusion  . TUBAL LIGATION      Home Medications:  Allergies as of 01/13/2021      Reactions   Contrast Media [iodinated Diagnostic Agents] Hives   Cephalexin  Nausea Only      Medication List       Accurate as of January 13, 2021  9:19 AM. If you have any questions, ask your nurse or doctor.        albuterol 108 (90 Base) MCG/ACT inhaler Commonly known as: VENTOLIN HFA Inhale 1-2 puffs into the lungs every 6 (six) hours as needed for wheezing or shortness of breath.   Alpha-Lipoic Acid 600 MG Caps Take 1,200 mg by mouth daily.   amLODipine 10 MG tablet Commonly known as: NORVASC Take 10 mg by mouth daily.   atorvastatin 40 MG tablet Commonly known as: LIPITOR Take 40 mg by mouth daily.   gabapentin 800 MG tablet Commonly known as: NEURONTIN Take 1 tablet (800 mg total) by mouth 4 (four) times daily.   lisinopril 40 MG tablet Commonly known as: ZESTRIL Take 40 mg by mouth daily.   oxyCODONE-acetaminophen 10-325 MG tablet Commonly known as: Percocet Take 1 tablet by mouth every 6 (six) hours as needed for pain. Start taking on: January 17, 2021   oxyCODONE-acetaminophen 10-325 MG tablet Commonly known as: Percocet Take 1 tablet by mouth every 6 (six) hours as needed for pain. Start taking on: Feb 16, 2021   oxyCODONE-acetaminophen 10-325 MG tablet Commonly known as: Percocet Take 1 tablet by mouth every 6 (six) hours as needed for pain. Start taking on: March 18, 2021   senna-docusate 8.6-50 MG tablet Commonly known as: Senokot-S Take 1 tablet by mouth 2 (two) times daily.   venlafaxine XR 150 MG 24 hr capsule Commonly known as: EFFEXOR-XR Take 150 mg by mouth daily with breakfast.   vitamin B-12 1000 MCG tablet Commonly known as: CYANOCOBALAMIN Take 1 tablet (1,000 mcg total) by mouth daily.   Vitamin D3 25 MCG (1000 UT) Caps Take 1,000 Units by mouth daily.       Allergies:  Allergies  Allergen Reactions  . Contrast Media [Iodinated Diagnostic Agents] Hives  . Cephalexin Nausea Only    Family History: Family History  Problem Relation Age of Onset  . Hypertension Mother   . Hyperlipidemia Sister   .  Hypertension Sister   . Depression Sister   . Depression Brother     Social History:  reports that she quit smoking about 16 years ago. She has never used smokeless tobacco. She reports current alcohol use. She reports that she does not use drugs.  ROS:                                        Physical Exam: BP (!) 164/79   Pulse (!) 108   Ht 5' 4"  (1.626 m)   Wt 90.7 kg   BMI 34.33 kg/m   Constitutional:  Alert and oriented, No acute distress.  Laboratory Data: Lab Results  Component Value Date   WBC 6.2 01/02/2020   HGB 13.3 01/02/2020   HCT 41.3 01/02/2020   MCV 88.1 01/02/2020  PLT 271 01/02/2020    Lab Results  Component Value Date   CREATININE 1.02 (H) 01/02/2020    No results found for: PSA  No results found for: TESTOSTERONE  No results found for: HGBA1C  Urinalysis    Component Value Date/Time   COLORURINE YELLOW (A) 01/02/2020 1229   APPEARANCEUR HAZY (A) 01/02/2020 1229   APPEARANCEUR Cloudy (A) 04/20/2019 1010   LABSPEC 1.032 (H) 01/02/2020 1229   PHURINE 5.0 01/02/2020 1229   GLUCOSEU NEGATIVE 01/02/2020 1229   HGBUR NEGATIVE 01/02/2020 1229   BILIRUBINUR NEGATIVE 01/02/2020 1229   BILIRUBINUR Negative 04/20/2019 1010   KETONESUR 5 (A) 01/02/2020 1229   PROTEINUR NEGATIVE 01/02/2020 1229   NITRITE NEGATIVE 01/02/2020 1229   LEUKOCYTESUR MODERATE (A) 01/02/2020 1229    Pertinent Imaging:   Assessment & Plan: Follow-up with urodynamics and proceed accordingly  1. Mixed incontinence  - Urinalysis, Complete   No follow-ups on file.  Reece Packer, MD  [facility] [address], [place], [place] [address] [phone]

## 2021-01-14 LAB — MICROSCOPIC EXAMINATION: Bacteria, UA: NONE SEEN

## 2021-01-14 LAB — URINALYSIS, COMPLETE
Bilirubin, UA: NEGATIVE
Glucose, UA: NEGATIVE
Ketones, UA: NEGATIVE
Leukocytes,UA: NEGATIVE
Nitrite, UA: NEGATIVE
Protein,UA: NEGATIVE
RBC, UA: NEGATIVE
Specific Gravity, UA: 1.02 (ref 1.005–1.030)
Urobilinogen, Ur: 0.2 mg/dL (ref 0.2–1.0)
pH, UA: 5.5 (ref 5.0–7.5)

## 2021-01-20 ENCOUNTER — Telehealth: Payer: Self-pay | Admitting: Student in an Organized Health Care Education/Training Program

## 2021-01-20 MED ORDER — OXYCODONE-ACETAMINOPHEN 10-325 MG PO TABS: 1.0000 | ORAL_TABLET | Freq: Four times a day (QID) | ORAL | 0 refills | Status: DC | PRN

## 2021-01-20 MED ORDER — OXYCODONE-ACETAMINOPHEN 10-325 MG PO TABS: 1.0000 | ORAL_TABLET | Freq: Four times a day (QID) | ORAL | 0 refills | Status: AC | PRN

## 2021-01-20 NOTE — Telephone Encounter (Signed)
Patient called stating she called pharmacy to fill script and pick up today. Pharmacy states they do not have any scripts for this patient from Dr. Holley Raring. Please call pharmacy and let patient know status. There are scripts in her chart.

## 2021-01-20 NOTE — Telephone Encounter (Signed)
Patient called and states that pharmacy told her they do not have any Rx on file for her pain medication.  I checked the chart and she had 3 Rx's written for oxycodone - apap 10 - 325 mg written on 01/09/21 to fill on 01/17/21, 02/16/21 and 03/18/21.    Called to [facility] in [place] to inquire about these Rx and they report they do not have any prescriptions on file.    I will report to DR Holley Raring to see how he would like to handle this matter.

## 2021-01-20 NOTE — Telephone Encounter (Signed)
I have resent these prescriptions to her pharmacy.  Requested Prescriptions   Signed Prescriptions Disp Refills  . oxyCODONE-acetaminophen (PERCOCET) 10-325 MG tablet 120 tablet 0    Sig: Take 1 tablet by mouth every 6 (six) hours as needed for pain.    Authorizing Provider: Gillis Santa  . oxyCODONE-acetaminophen (PERCOCET) 10-325 MG tablet 120 tablet 0    Sig: Take 1 tablet by mouth every 6 (six) hours as needed for pain.    Authorizing Provider: Gillis Santa  . oxyCODONE-acetaminophen (PERCOCET) 10-325 MG tablet 120 tablet 0    Sig: Take 1 tablet by mouth every 6 (six) hours as needed for pain.    Authorizing Provider: Gillis Santa

## 2021-01-21 NOTE — Telephone Encounter (Signed)
Attempted to call patient to let her know that Dr Holley Raring has resent her Rx's.  Mailbox was full and unable to leave voicemail.

## 2021-01-21 NOTE — Telephone Encounter (Signed)
Voicemail left with patient that Rx have been resent to [facility] in [place].

## 2021-01-23 ENCOUNTER — Other Ambulatory Visit: Payer: Self-pay | Admitting: Neurosurgery

## 2021-01-23 DIAGNOSIS — Z981 Arthrodesis status: Secondary | ICD-10-CM

## 2021-01-23 DIAGNOSIS — M4802 Spinal stenosis, cervical region: Secondary | ICD-10-CM

## 2021-01-23 DIAGNOSIS — G959 Disease of spinal cord, unspecified: Secondary | ICD-10-CM

## 2021-01-23 DIAGNOSIS — M5414 Radiculopathy, thoracic region: Secondary | ICD-10-CM

## 2021-02-02 ENCOUNTER — Ambulatory Visit
Admission: RE | Admit: 2021-02-02 | Discharge: 2021-02-02 | Disposition: A | Discharge status: Home / Self Care | Payer: Medicare Other | Source: Ambulatory Visit | Attending: Neurosurgery | Admitting: Neurosurgery

## 2021-02-02 ENCOUNTER — Other Ambulatory Visit: Payer: Self-pay

## 2021-02-02 DIAGNOSIS — M4802 Spinal stenosis, cervical region: Secondary | ICD-10-CM | POA: Insufficient documentation

## 2021-02-02 DIAGNOSIS — G959 Disease of spinal cord, unspecified: Secondary | ICD-10-CM | POA: Diagnosis present

## 2021-02-02 DIAGNOSIS — M5414 Radiculopathy, thoracic region: Secondary | ICD-10-CM | POA: Diagnosis present

## 2021-02-02 IMAGING — MR MR CERVICAL SPINE W/O CM
5 series · 40 of 48 positions shown · non-contrast
Comparison: None

CLINICAL DATA: Cervical fusion. Cervical myelopathy and spinal
stenosis

EXAM:
MRI CERVICAL SPINE WITHOUT CONTRAST
TECHNIQUE: Multiplanar, multisequence MR imaging of the cervical spine was
performed. No intravenous contrast was administered.

[Series 1: T2 · sagittal · 3.0mm · 0.62mm/px · 6 of 15 slices shown (1 of 2)]
[im 1/15]
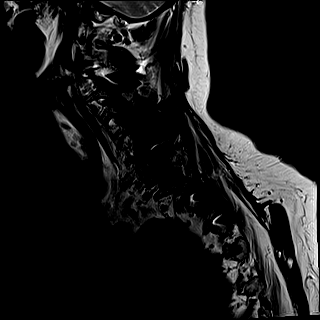
[im 3/15]
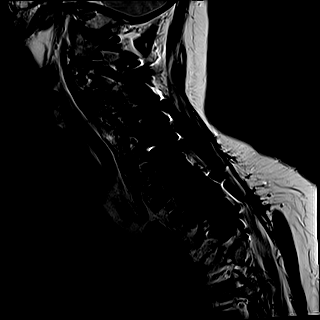
[im 6/15]
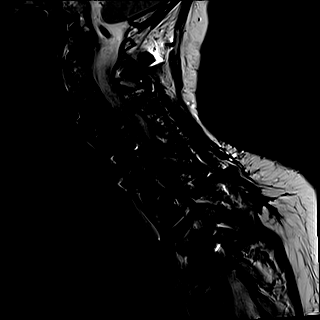
[im 9/15]
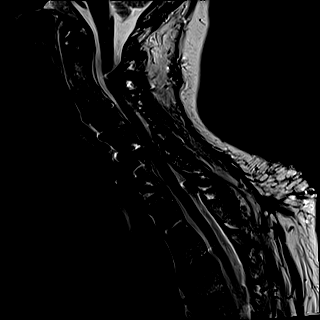
[im 12/15]
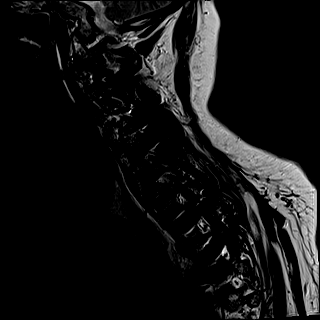
[im 15/15]
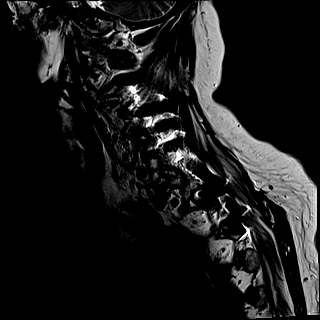

[Series 2: FLAIR · sagittal · 3.0mm · 0.78mm/px · 7 of 15 slices shown]
[im 1/15]
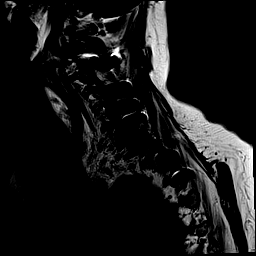
[im 3/15]
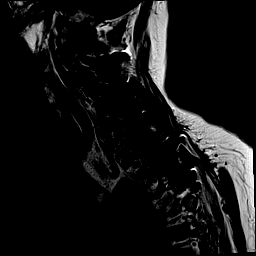
[im 5/15]
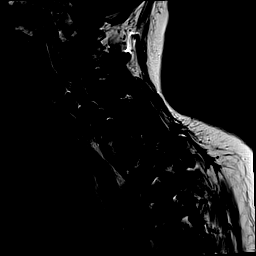
[im 8/15]
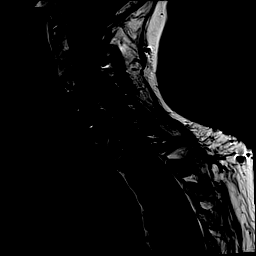
[im 10/15]
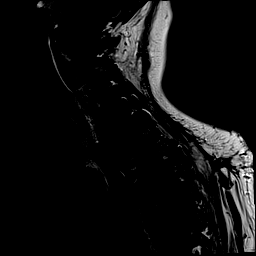
[im 12/15]
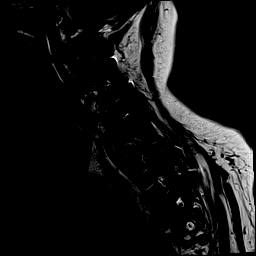
[im 15/15]
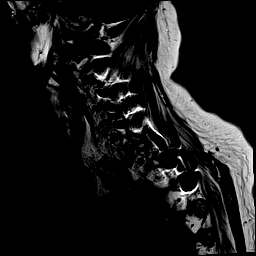

[Series 3: STIR · sagittal · 3.0mm · 0.62mm/px · 7 of 15 slices shown]
[im 1/15]
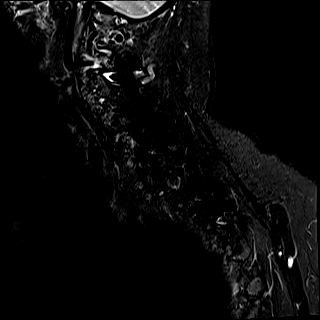
[im 3/15]
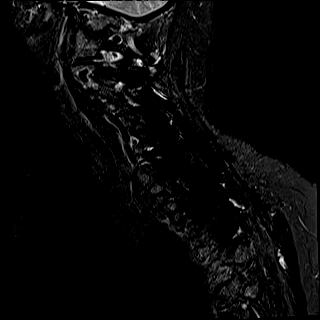
[im 5/15]
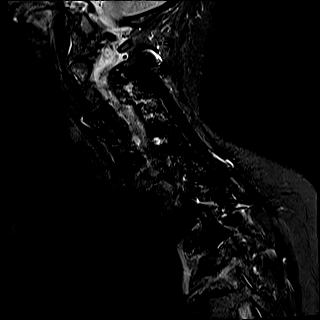
[im 8/15]
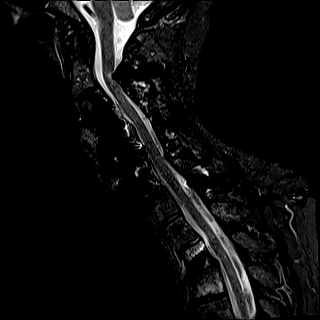
[im 10/15]
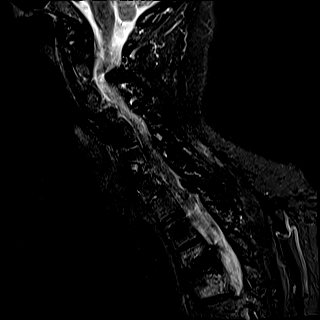
[im 12/15]
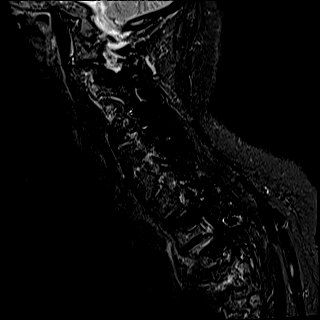
[im 15/15]
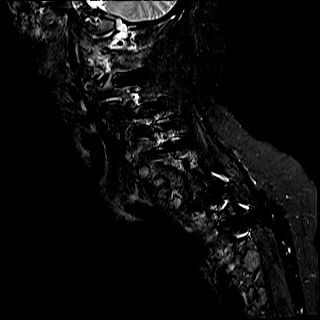

[Series 4: T2 · axial · 3.0mm · 0.70mm/px · z∈[-123,-35]mm · 12 of 29 slices shown (2 of 2)]
[im 1/29]
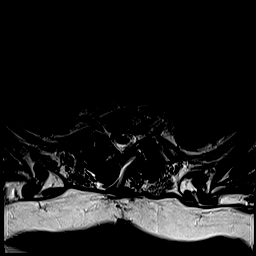
[im 3/29]
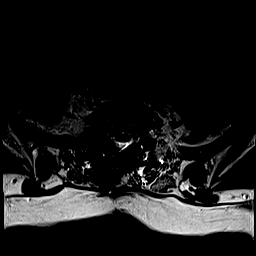
[im 5/29]
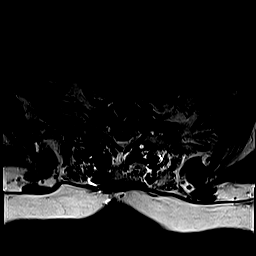
[im 7/29]
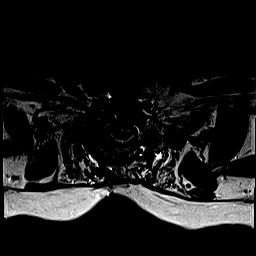
[im 9/29]
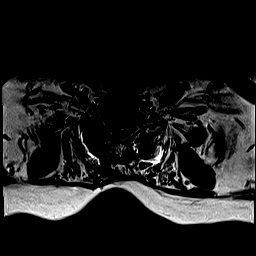
[im 11/29]
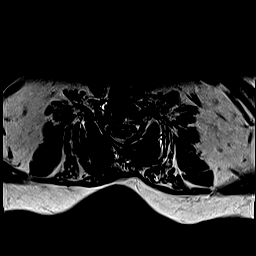
[im 13/29]
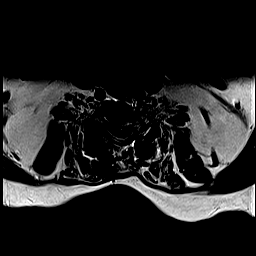
[im 16/29]
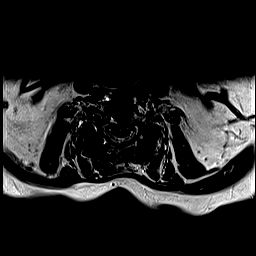
[im 18/29]
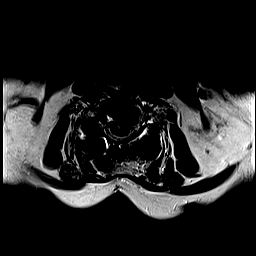
[im 20/29]
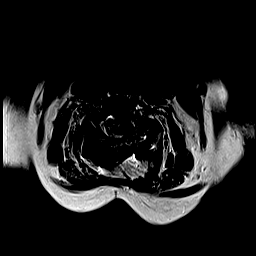
[im 24/29]
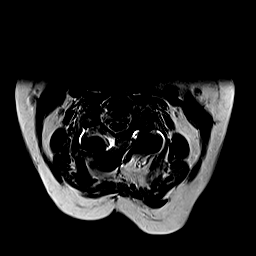
[im 29/29]
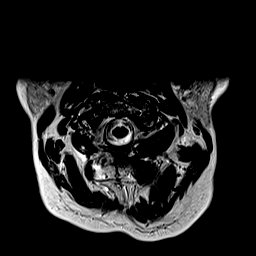

[Series 5: ax mpgr · axial · 3.0mm · 0.35mm/px · z∈[-123,-35]mm · 8 of 29 slices shown]
[im 1/29]
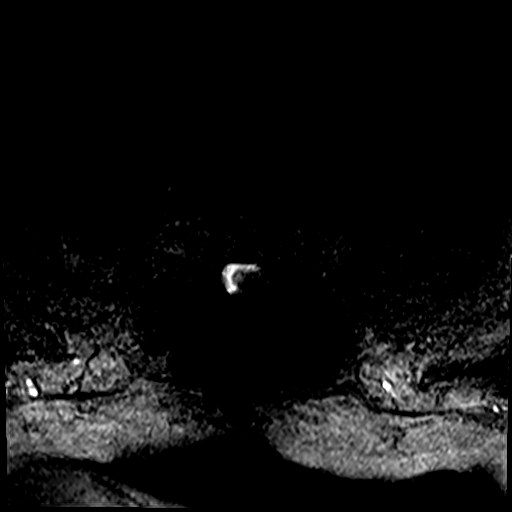
[im 5/29]
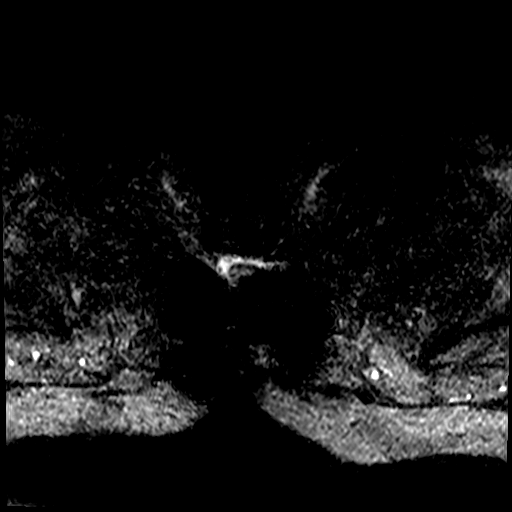
[im 9/29]
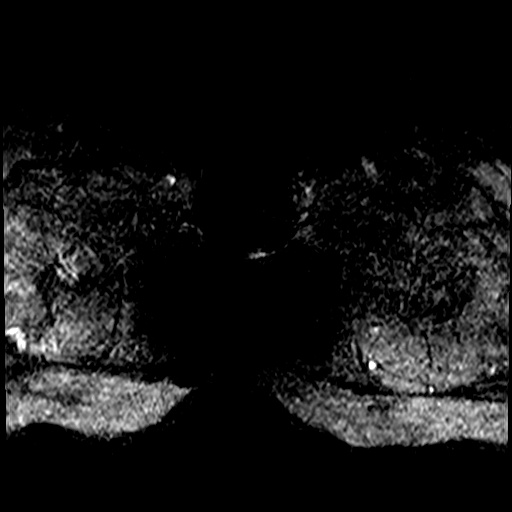
[im 13/29]
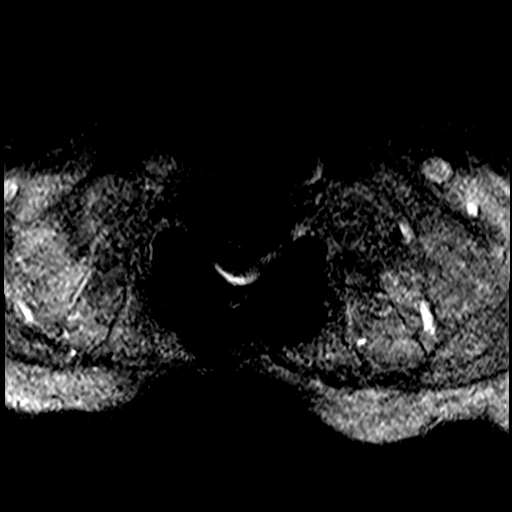
[im 16/29]
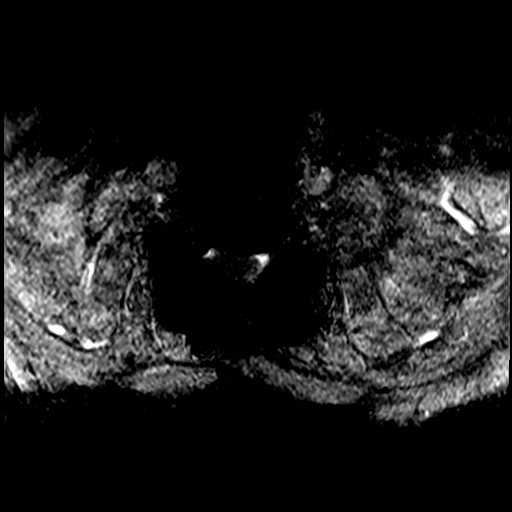
[im 20/29]
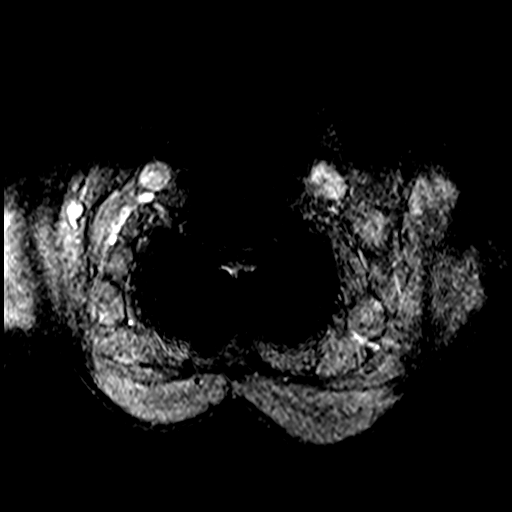
[im 24/29]
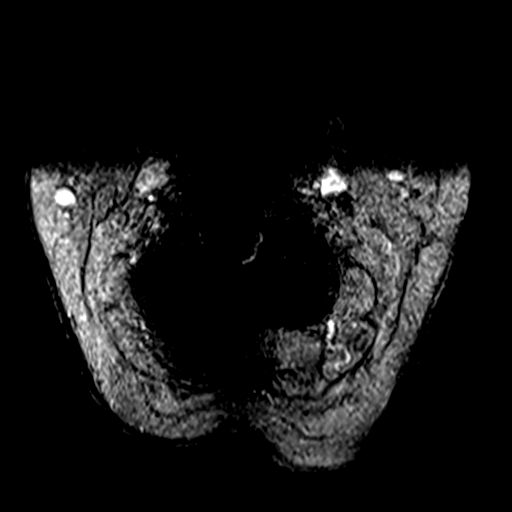
[im 29/29]
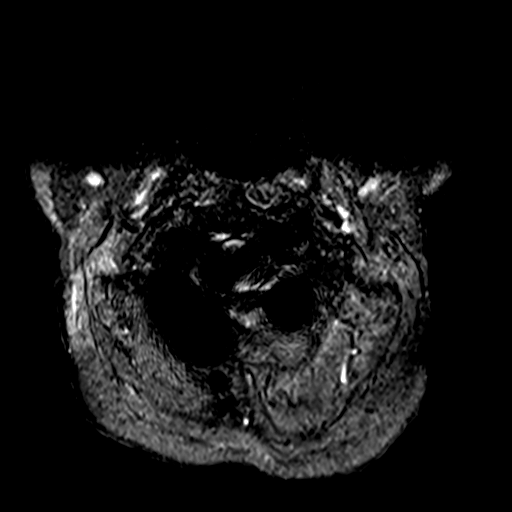

[40 of 48 positions shown; findings below may reference images not displayed]

FINDINGS: Alignment: Mild anterolisthesis C7-T1.  Mild cervical kyphosis

Vertebrae: Negative for fracture or mass.

Cord: Cord evaluation limited by artifact from hardware. No cord
signal abnormality or cord compression identified

Hardware: ACDF C3 through C7. Posterior screw and rod fusion C2
through T3.

Posterior Fossa, vertebral arteries, paraspinal tissues: Negative
for paraspinous mass or fluid collection

Disc levels:

C2-3: Negative for stenosis

C3-4: Negative for stenosis

C4-5: Negative for stenosis

C5-6: Negative for stenosis

C6-7: Mild to moderate foraminal narrowing bilaterally due to
spurring. Spinal canal adequate in size

C7-T1: Mild anterolisthesis. Mild disc and facet degeneration. No
significant stenosis.
IMPRESSION: ACDF C3 through C7.  Posterior hardware fusion C2 through T3.

No significant spinal stenosis or cord compression. Mild to moderate
foraminal narrowing bilaterally due to spurring at C6-7.

## 2021-02-02 IMAGING — MR MR THORACIC SPINE W/O CM
5 of 6 series · 28 of 48 positions shown · non-contrast
Comparison: MRI thoracic spine [DATE]

CLINICAL DATA: T10 to pelvis posterior spinal fusion, L1-2 XLIF and
L1-2 LABELLE SALHA osteotomy on [DATE] C/o trouble walking,
left shoulder pain, right rib pain. Cervical myelopathy and spinal
stenosis. Thoracic radiculitis.

EXAM:
MRI THORACIC SPINE WITHOUT CONTRAST
TECHNIQUE: Multiplanar, multisequence MR imaging of the thoracic spine was
performed. No intravenous contrast was administered.

[Series 18: T1 · sagittal · 6.0mm · 1.41mm/px · 2 of 9 slices shown (1 of 2)]
[im 1/9]
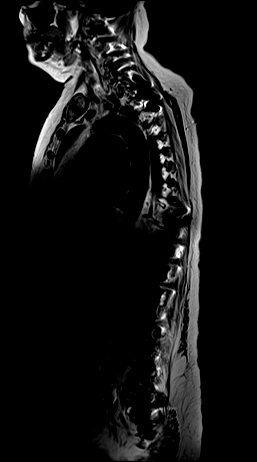
[im 9/9]
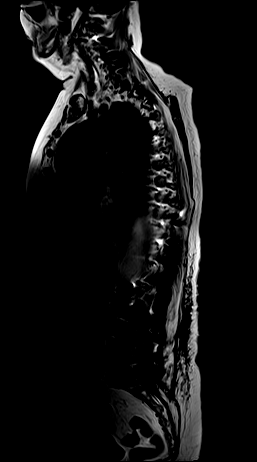

[Series 19: T2 · sagittal · 3.0mm · 1.06mm/px · 6 of 17 slices shown (1 of 2)]
[im 1/17]
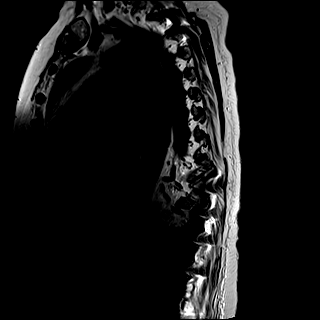
[im 4/17]
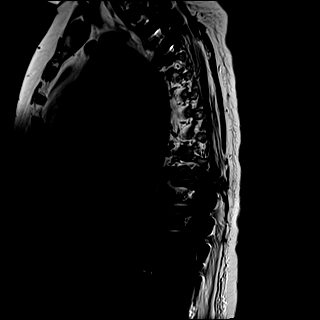
[im 7/17]
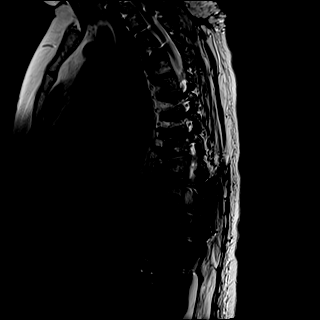
[im 10/17]
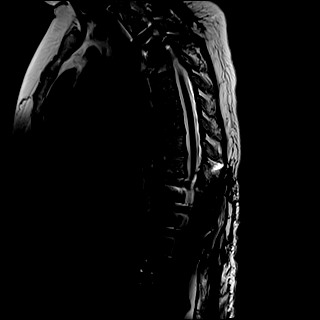
[im 13/17]
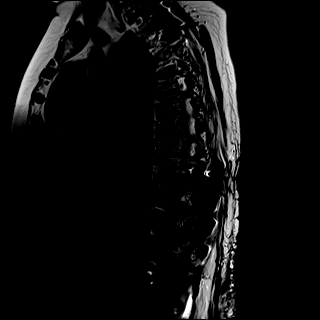
[im 17/17]
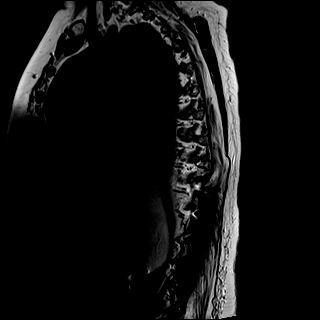

[Series 20: T1 · sagittal · 3.0mm · 1.06mm/px · 6 of 17 slices shown (2 of 2)]
[im 1/17]
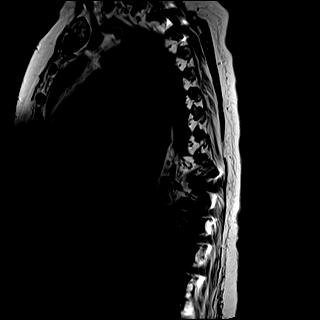
[im 4/17]
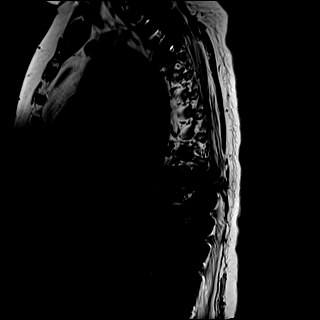
[im 7/17]
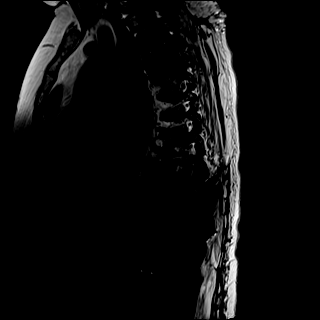
[im 10/17]
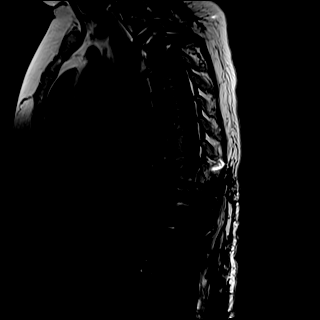
[im 13/17]
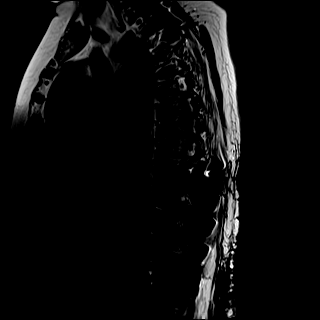
[im 17/17]
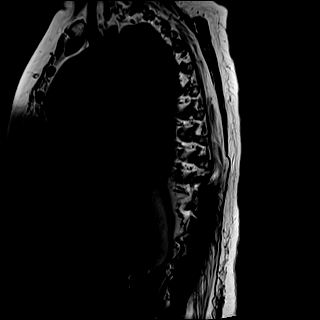

[Series 21: STIR · sagittal · 3.0mm · 0.53mm/px · 6 of 17 slices shown]
[im 1/17]
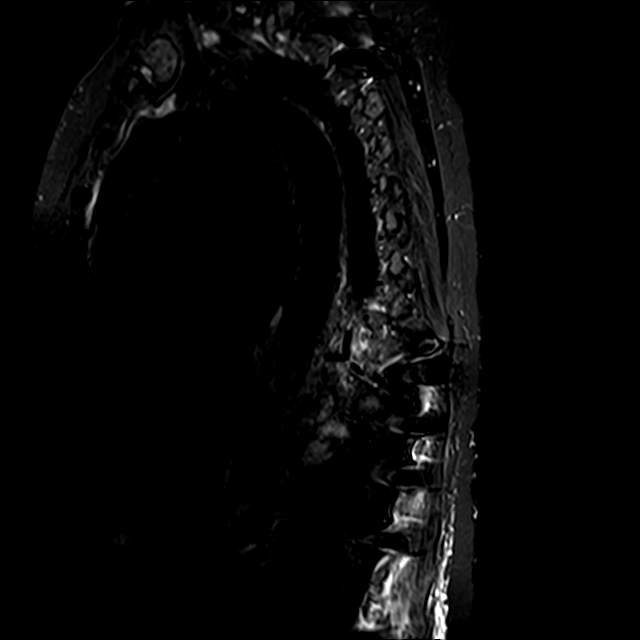
[im 4/17]
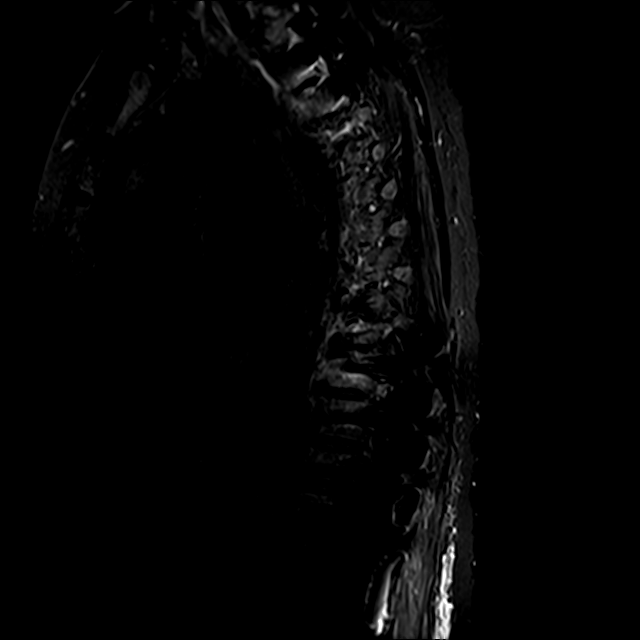
[im 7/17]
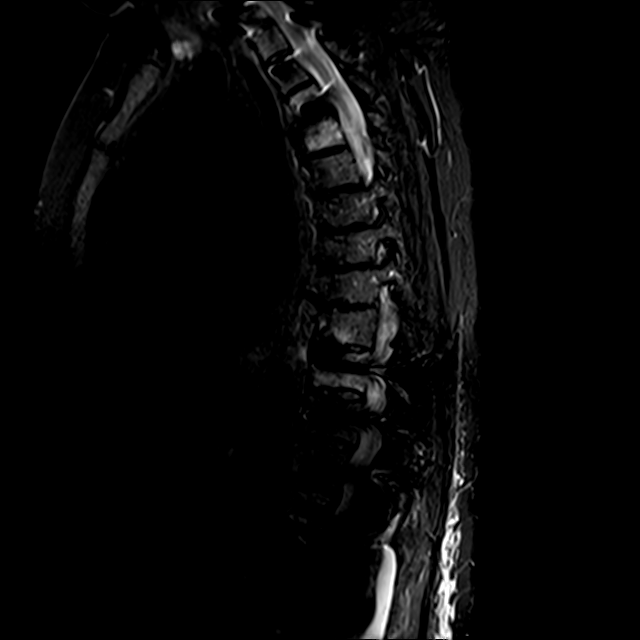
[im 10/17]
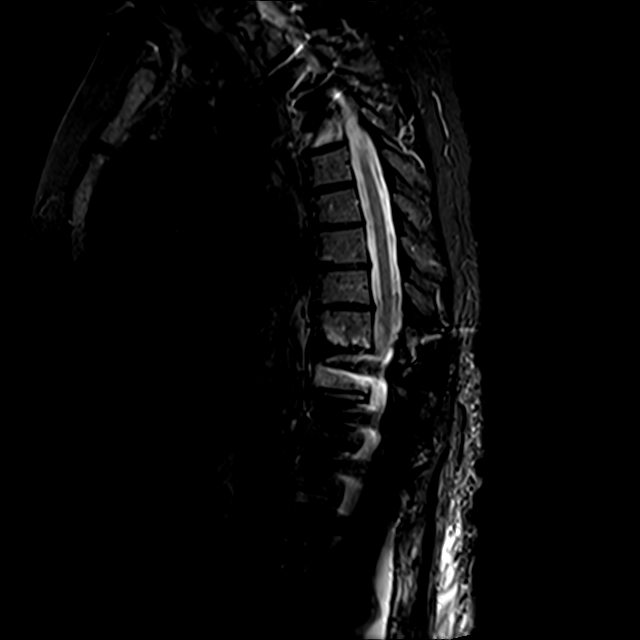
[im 13/17]
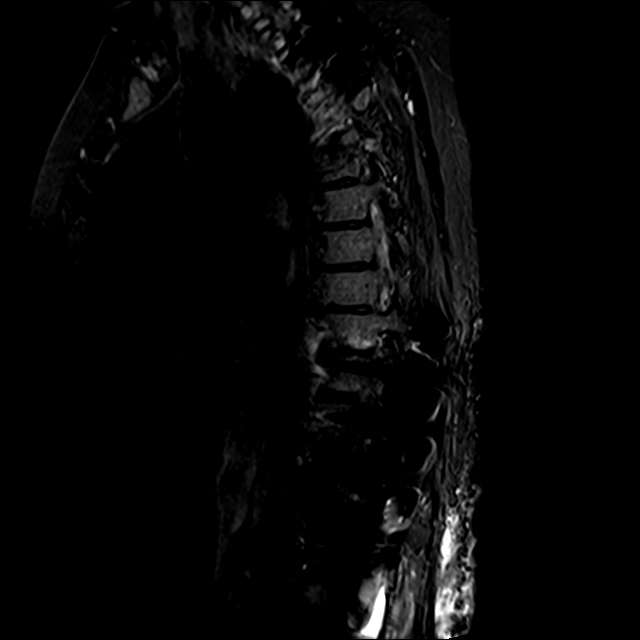
[im 17/17]
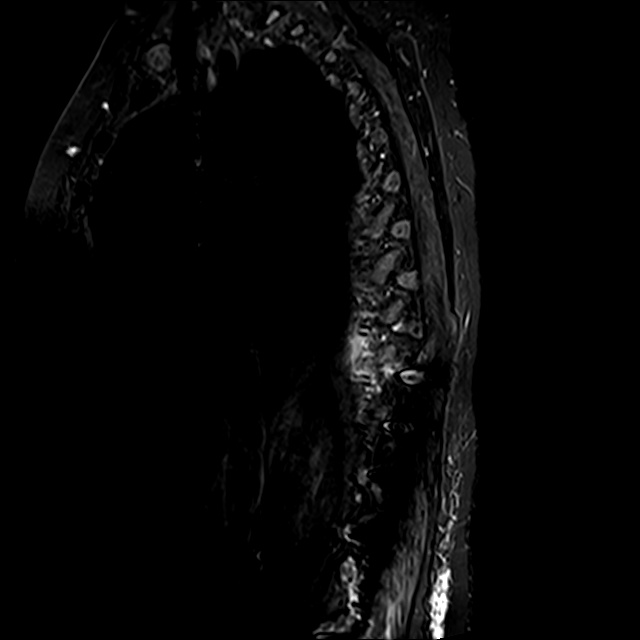

[Series 22: T2 · axial · 4.0mm · 0.59mm/px · z∈[-323,-123]mm · 8 of 39 slices shown (2 of 2)]
[im 1/39]
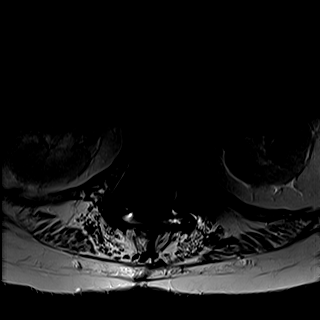
[im 6/39]
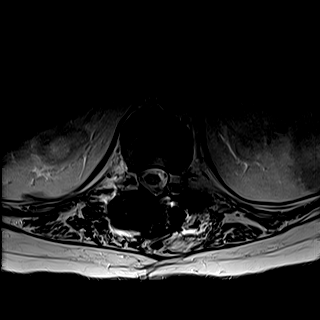
[im 12/39]
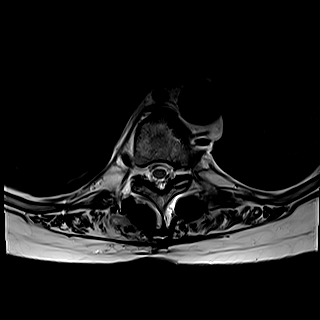
[im 18/39]
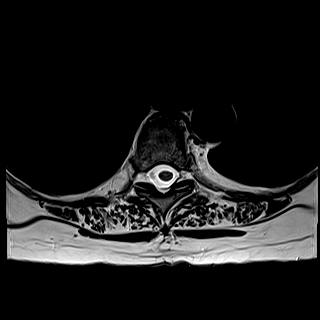
[im 21/39]
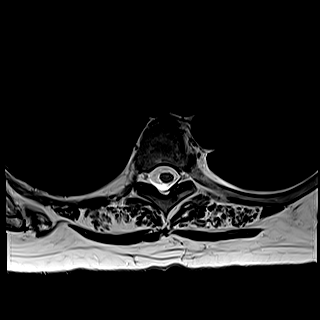
[im 27/39]
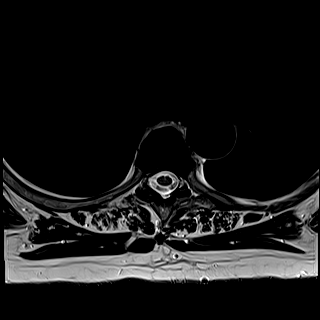
[im 33/39]
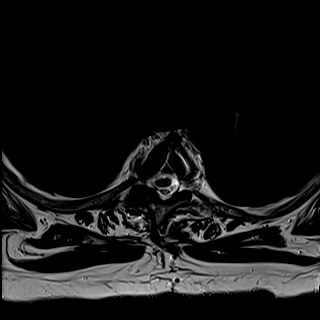
[im 39/39]
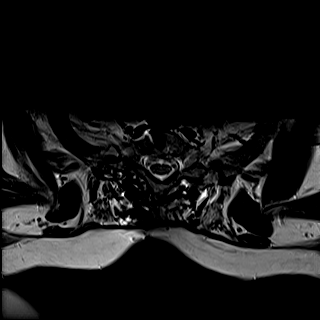

[28 of 48 positions shown; findings below may reference images not displayed]

FINDINGS: Alignment:  Mild retrolisthesis T9-10 and T11-T12 and T12-L1.

Vertebrae: Negative for fracture or mass. Posterior hardware fusion
in the cervical spine extending to the T3 level unchanged from the
prior study. Posterior pedicle screw and rod fusion extending from
T10 into the lumbar spine. This is been extended above the L2 level
since the prior MRI.

Cord: Cord evaluation limited by artifact from hardware. Allowing
for this, no cord compression or cord signal abnormality identified.

Paraspinal and other soft tissues: Negative

Disc levels:

Moderate disc degeneration and spurring at T3-4 without significant
stenosis. No interval change. Mild disc degeneration T4 through T9
without significant stenosis.

T9-10: Moderate disc degeneration with spurring. Bilateral facet
hypertrophy. Mild spinal and mild foraminal stenosis bilaterally.
This has progressed since the prior study.
IMPRESSION: 1. Posterior cervical and upper thoracic fusion extending to the T3
level. There is moderate to extensive disc degeneration at T3-4
similar to the prior study not causing significant foraminal
stenosis
2. Interval extension of lumbar fusion to the T10 level. There is
progressive degenerative change at T9-10 at the level below the
fusion. There is mild spinal stenosis and mild foraminal stenosis
bilaterally due to spurring at T9-10.

## 2021-02-03 ENCOUNTER — Ambulatory Visit (INDEPENDENT_AMBULATORY_CARE_PROVIDER_SITE_OTHER): Payer: Medicare Other | Admitting: Urology

## 2021-02-03 ENCOUNTER — Other Ambulatory Visit: Payer: Self-pay | Admitting: Urology

## 2021-02-03 ENCOUNTER — Ambulatory Visit: Payer: Medicare Other

## 2021-02-03 ENCOUNTER — Encounter: Payer: Self-pay | Admitting: Urology

## 2021-02-03 ENCOUNTER — Other Ambulatory Visit: Payer: Medicare Other

## 2021-02-03 VITALS — BP 178/99 | HR 96

## 2021-02-03 DIAGNOSIS — N3946 Mixed incontinence: Secondary | ICD-10-CM

## 2021-02-03 NOTE — Progress Notes (Signed)
02/03/2021 10:33 AM   Lisa Good Jan 16, 1951 [id number]  Referring provider: [name], MD [place] [facility]  [place] [address]  Chief Complaint  Patient presents with  . Urinary Incontinence    HPI: I was consulted to assess the patient is urinary incontinence. She has had 2 synthetic slings and one pubovaginal sling in [place]. She might leak with coughing sneezing with a hard cough but otherwise has no stress incontinence. She is urge incontinence worse when she goes from a supine to standing position she can soak a pad. She wears a 2 pads a day. The positional changes significant trigger. No bedwetting  She voids every 3-4 hours gets up twice at night  She recently had neck surgery and needs another back operation. She has had 2 bladder infections in the last 6 months.  By history she recently failed Ditropan and Myrbetriq   well supported bladder neck and negative cough test. Patient had about a 5 cm vaginal length. 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 I felt some scarring near the cuff. She had no stress incontinence  Likely patient likely has primarily an overactive bladder with triggering and possibly mild stress incontinence.   Patient had back surgery and did not follow-up. Last appointment more than a year ago. She has had surgery on her neck mid back and low back.  She has urge incontinence. Main symptom is she has high-volume leakage when she goes from a supine to standing position. She leaks a small amount with coughing sneezing. No bedwetting. She wears 2 pads a day with a varying amount of leakage  Voids every 2-3 hours gets up 3-4 times a night.  Cystoscopy: normal  Today Patient voided 63 mL during urodynamics with a maximum flow 11 mils per second and had a residual of 100 mL.  Maximum bladder capacity was 237 mL.  Bladder was unstable reaching pressure 4 cm of water.   She felt urgency and leaked.  At 200 mL she had mild leakage at 38 cm of water.  With a Valsalva maneuver at the same volume she leaked a mild amount at 70 cm of water.  At 263 mL she had moderate severe leakage at 64 cm of water.  During voluntary voiding she voided 247 mL with a max of flow 12 mils per second.  Maximum voiding pressure was 31 cm water.  She emptied well with a residual 26 mL.  EMG activity increased during voiding.  She did have a lot of urgency during the study.  The details of the urodynamics are signed and dictated  PMH: Past Medical History:  Diagnosis Date  . Allergy    seasonal  . Arthritis   . Depression   . GERD (gastroesophageal reflux disease)   . Hyperlipidemia   . Hypertension   . Iron deficiency anemia 11/01/2019  . Neuromuscular disorder ([place])    peripheral neuropathy/feet  . Sleep apnea    CPAP     Surgical History: Past Surgical History:  Procedure Laterality Date  . 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 COLONOSCOPY WITH PROPOFOL N/A 11/10/2019   Procedure: COLONOSCOPY WITH PROPOFOL;  Surgeon: Jonathon Bellows, MD;  Location: [facility];  Service: Endoscopy;  Laterality: N/A;  . ESOPHAGOGASTRODUODENOSCOPY (EGD) WITH PROPOFOL N/A 11/10/2019   Procedure: ESOPHAGOGASTRODUODENOSCOPY (EGD) WITH PROPOFOL;  Surgeon: Jonathon Bellows, MD;  Location: [place];  Service: Endoscopy;  Laterality: N/A;  . EXTRACORPOREAL SHOCK  WAVE LITHOTRIPSY Right 04/27/2019   Procedure: EXTRACORPOREAL SHOCK WAVE LITHOTRIPSY (ESWL);  Surgeon: Billey Co, MD;  Location: [facility];  Service: Urology;  Laterality: Right;  . JOINT REPLACEMENT Right    knee  . KNEE ARTHROPLASTY Left 01/12/2020   Procedure: COMPUTER ASSISTED TOTAL KNEE ARTHROPLASTY;  Surgeon: Dereck Leep, MD;  Location: [facility];  Service: Orthopedics;  Laterality: Left;  . POLYPECTOMY  11/10/2019   Procedure: POLYPECTOMY;  Surgeon: Jonathon Bellows, MD;  Location: [place];  Service: Endoscopy;;  .  SPINAL FUSION  07/10/2019   C4-5 corpectomy with C6-7 ACDF, C2-T3 spinal fusion  . SPINE SURGERY     spinal fusion  . TUBAL LIGATION      Home Medications:  Allergies as of 02/03/2021      Reactions   Contrast Media [iodinated Diagnostic Agents] Hives   Cephalexin Nausea Only      Medication List       Accurate as of Feb 03, 2021 10:33 AM. If you have any questions, ask your nurse or doctor.        STOP taking these medications   albuterol 108 (90 Base) MCG/ACT inhaler Commonly known as: VENTOLIN HFA Stopped by: Reece Packer, MD     TAKE these medications   Alpha-Lipoic Acid 600 MG Caps Take 1,200 mg by mouth daily.   amLODipine 10 MG tablet Commonly known as: NORVASC Take 10 mg by mouth daily.   atorvastatin 40 MG tablet Commonly known as: LIPITOR Take 40 mg by mouth daily.   gabapentin 800 MG tablet Commonly known as: NEURONTIN Take 1 tablet (800 mg total) by mouth 4 (four) times daily.   lisinopril 40 MG tablet Commonly known as: ZESTRIL Take 40 mg by mouth daily.   oxyCODONE-acetaminophen 10-325 MG tablet Commonly known as: Percocet Take 1 tablet by mouth every 6 (six) hours as needed for pain. Start taking on: Feb 16, 2021   oxyCODONE-acetaminophen 10-325 MG tablet Commonly known as: Percocet Take 1 tablet by mouth every 6 (six) hours as needed for pain. Start taking on: March 18, 2021   senna-docusate 8.6-50 MG tablet Commonly known as: Senokot-S Take 1 tablet by mouth 2 (two) times daily.   venlafaxine XR 150 MG 24 hr capsule Commonly known as: EFFEXOR-XR Take 150 mg by mouth daily with breakfast.   vitamin B-12 1000 MCG tablet Commonly known as: CYANOCOBALAMIN Take 1 tablet (1,000 mcg total) by mouth daily.   Vitamin D3 25 MCG (1000 UT) Caps Take 1,000 Units by mouth daily.       Allergies:  Allergies  Allergen Reactions  . Contrast Media [Iodinated Diagnostic Agents] Hives  . Cephalexin Nausea Only    Family History: Family  History  Problem Relation Age of Onset  . Hypertension Mother   . Hyperlipidemia Sister   . Hypertension Sister   . Depression Sister   . Depression Brother     Social History:  reports that she quit smoking about 16 years ago. She has never used smokeless tobacco. She reports current alcohol use. She reports that she does not use drugs.  ROS:                                        Physical Exam: BP (!) 178/99   Pulse 96   Constitutional:  Alert and oriented, No acute distress.  Laboratory Data: Lab Results  Component  Value Date   WBC 6.2 01/02/2020   HGB 13.3 01/02/2020   HCT 41.3 01/02/2020   MCV 88.1 01/02/2020   PLT 271 01/02/2020    Lab Results  Component Value Date   CREATININE 1.02 (H) 01/02/2020    No results found for: PSA  No results found for: TESTOSTERONE  No results found for: HGBA1C  Urinalysis    Component Value Date/Time   COLORURINE YELLOW (A) 01/02/2020 1229   APPEARANCEUR Clear 01/13/2021 0912   LABSPEC 1.032 (H) 01/02/2020 1229   PHURINE 5.0 01/02/2020 1229   GLUCOSEU Negative 01/13/2021 0912   HGBUR NEGATIVE 01/02/2020 1229   BILIRUBINUR Negative 01/13/2021 0912   KETONESUR 5 (A) 01/02/2020 1229   PROTEINUR Negative 01/13/2021 0912   PROTEINUR NEGATIVE 01/02/2020 1229   NITRITE Negative 01/13/2021 0912   NITRITE NEGATIVE 01/02/2020 1229   LEUKOCYTESUR Negative 01/13/2021 0912   LEUKOCYTESUR MODERATE (A) 01/02/2020 1229    Pertinent Imaging:   Assessment & Plan: Patient has mixed incontinence.  Approximately 80% of her voiding dysfunction is due to an overactive bladder with urge incontinence.  She has very mild stress incontinence.  I will see her back on the new beta 3 agonist.  If this fails I will offer all 4 third line therapies to her.  Based upon previous surgery and some medical comorbidities may not be the best patient for the study.  There are no diagnoses linked to this encounter.  No follow-ups  on file.  Reece Packer, MD  [place] [address] [place], [place] [address] [phone]

## 2021-02-17 ENCOUNTER — Ambulatory Visit
Admission: RE | Admit: 2021-02-17 | Discharge: 2021-02-17 | Disposition: A | Discharge status: Home / Self Care | Payer: Medicare Other | Source: Ambulatory Visit | Attending: Neurosurgery | Admitting: Neurosurgery

## 2021-02-17 ENCOUNTER — Other Ambulatory Visit: Payer: Self-pay

## 2021-02-17 DIAGNOSIS — M4802 Spinal stenosis, cervical region: Secondary | ICD-10-CM | POA: Insufficient documentation

## 2021-02-17 DIAGNOSIS — Z981 Arthrodesis status: Secondary | ICD-10-CM | POA: Diagnosis present

## 2021-02-17 IMAGING — CT CT T SPINE W/O CM
3 of 4 series · 9 of 33 positions shown, 10 images · non-contrast
Comparison: CT cervical spine the same day reported separately.
Thoracic spine MRI [DATE], and earlier.

CLINICAL DATA: 70-year-old female with prior surgery. Recurrent
pain.

EXAM:
CT THORACIC SPINE WITHOUT CONTRAST
TECHNIQUE: Multidetector CT images of the thoracic were obtained using the
standard protocol without intravenous contrast.

[Series 6: coronal bone · coronal · 0.23mm/px · 3 of 98 slices shown]
[im 20/98  bone]
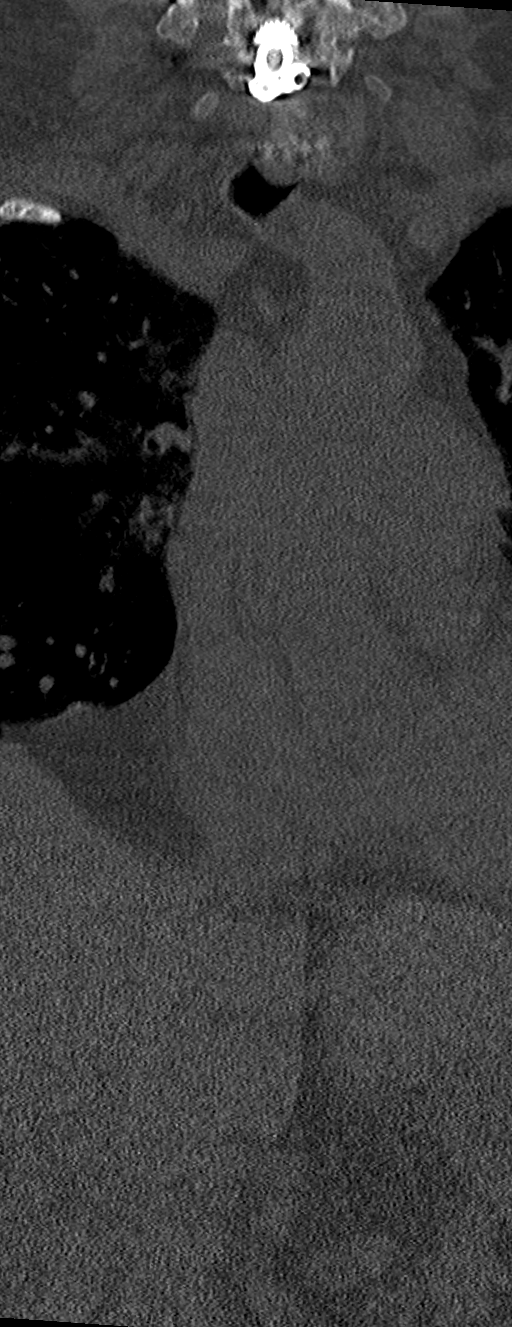
[im 39/98  bone]
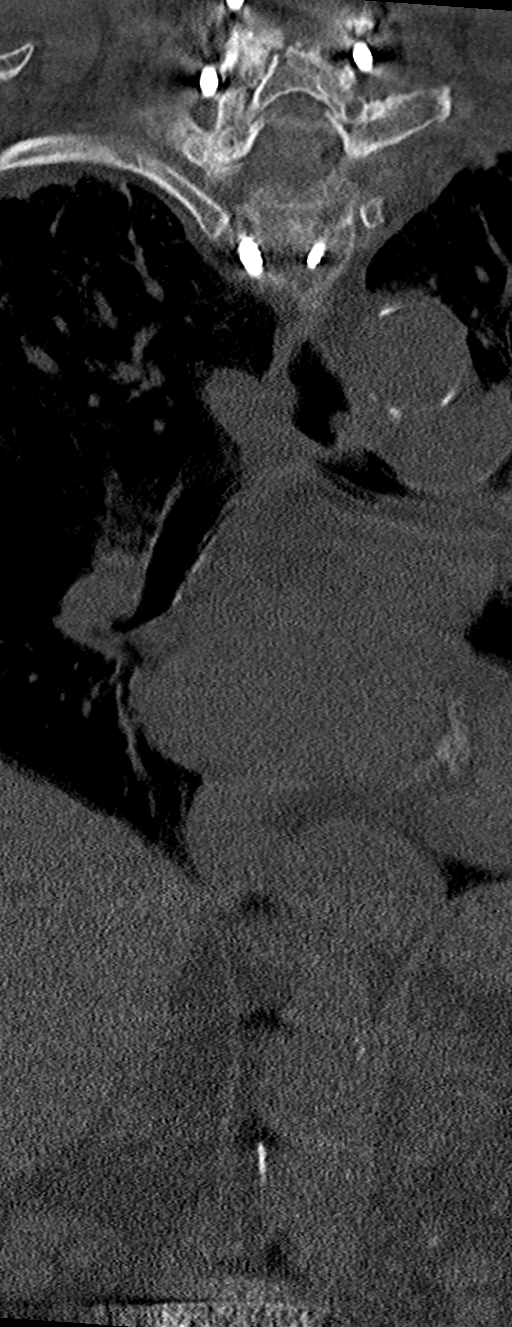
[im 59/98  bone]
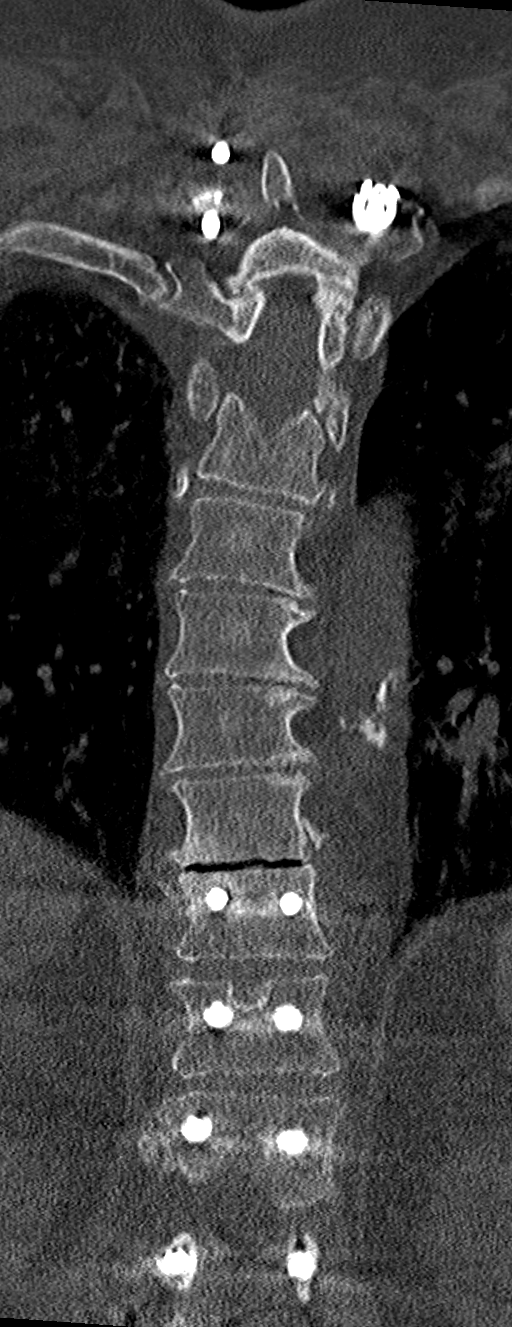

[Series 7: sagittal bone · sagittal · 0.38mm/px · 5 of 64 slices shown]
[im 22/64  bone]
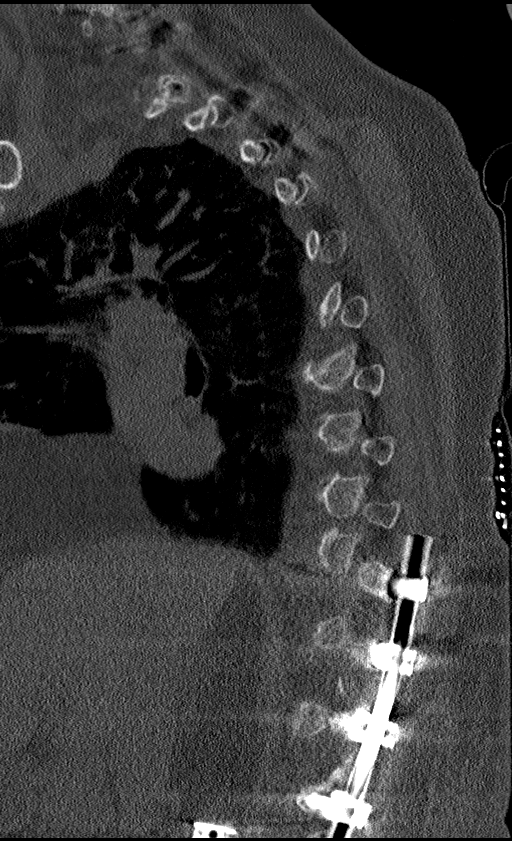
[im 27/64  bone]
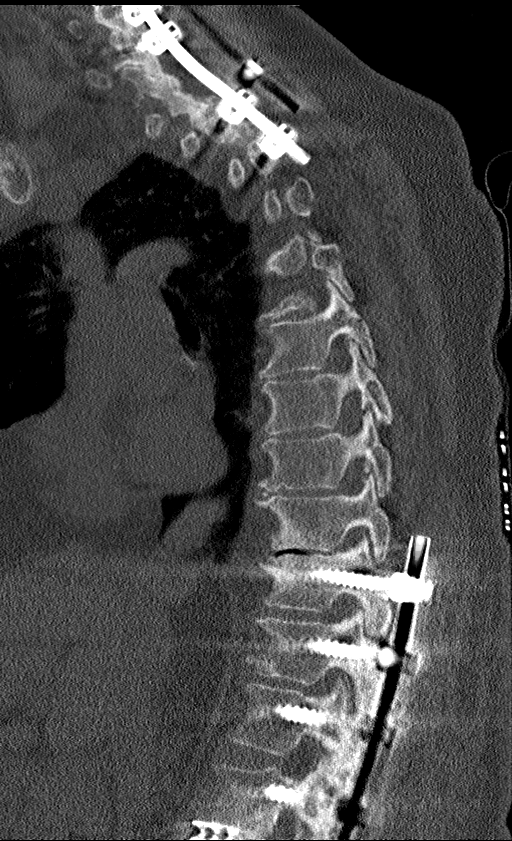
[im 32/64  bone]
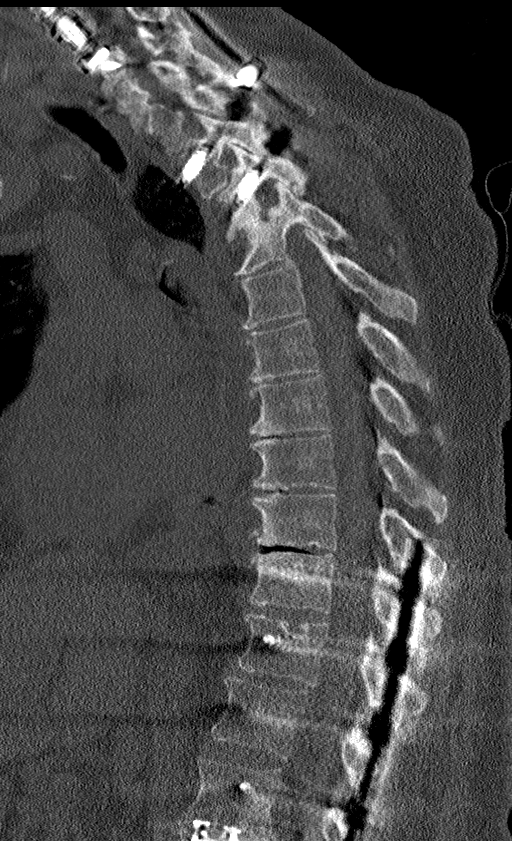
[im 37/64  bone]
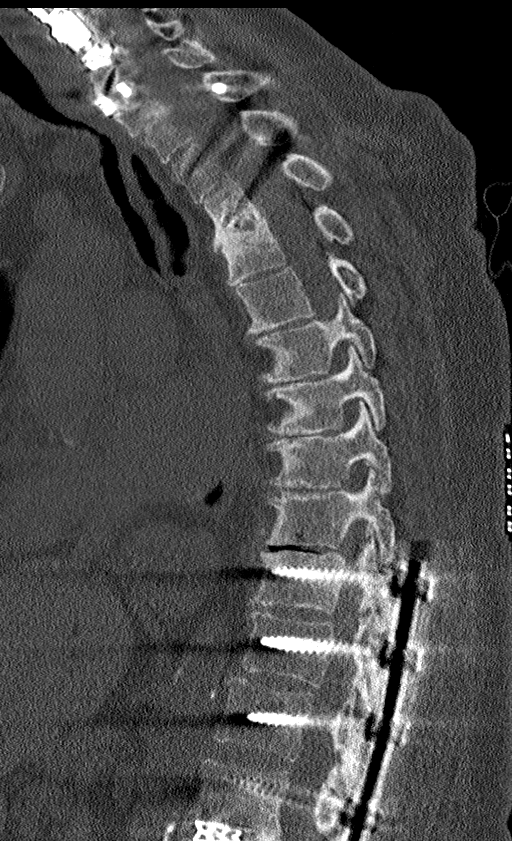
[im 43/64  bone]
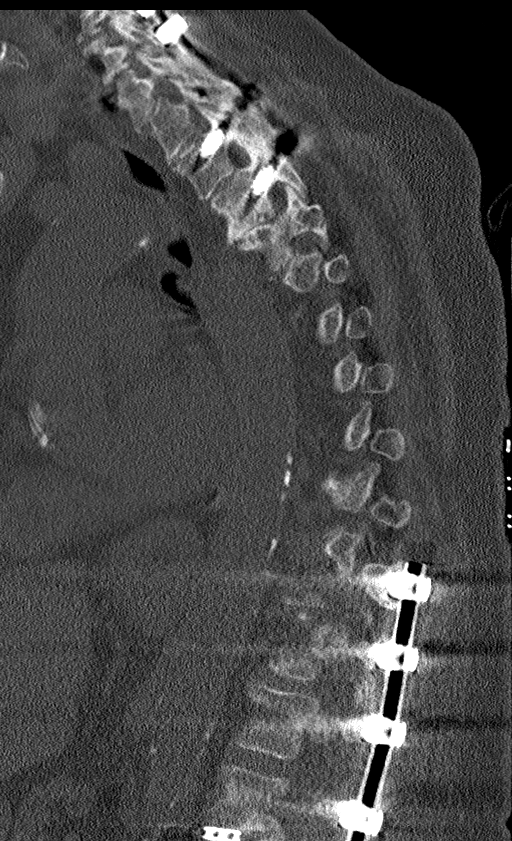

[Series 8: multidisc · axial · 0.21mm/px · z∈[-336,-336]mm · 1 of 161 slices shown, 2 images]
[im 81/161  soft-tissue]
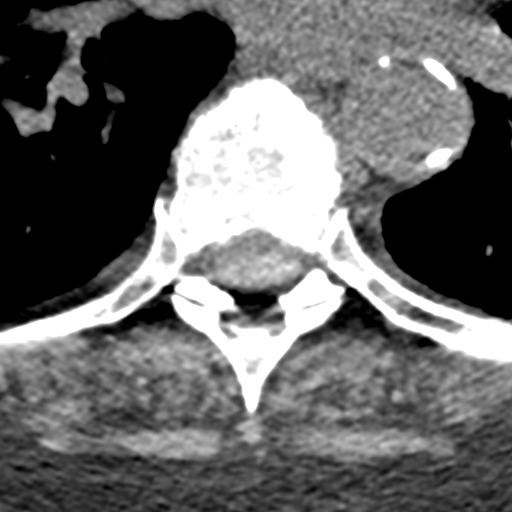
[im 81/161  bone]
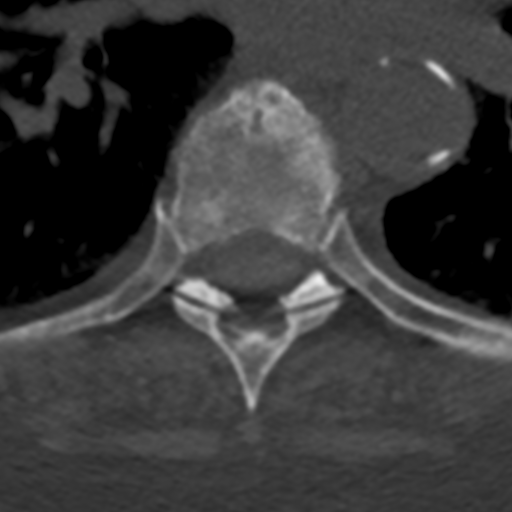

[9 of 33 positions shown; findings below may reference images not displayed]

FINDINGS: Limited cervical spine imaging:  Reported separately today.

Thoracic spine segmentation:  Normal.

Alignment: Mild dextroconvex thoracic scoliosis. Relatively
preserved thoracic kyphosis.

Vertebrae: Postoperative details are below. No acute osseous
abnormality identified.

Paraspinal and other soft tissues: Atelectatic changes to the
visible major airways but both lungs appear well aerated. No pleural
or pericardial effusion. Calcified coronary artery and Calcified
aortic atherosclerosis. Small gastric hiatal hernia. Evidence of
cholelithiasis on series 5, image 144. Otherwise negative visible
noncontrast upper abdominal viscera.

Postoperative changes to the posterior paraspinal soft tissues with
no adverse features.

Disc levels:

T1-T2: T1 lamina and bilateral T2 trans pedicle fusion hardware.
Solid-appearing posterior element arthrodesis. No stenosis.

T2-T3: Bilateral pedicle screws with solid-appearing posterior
element arthrodesis. No definite stenosis.

T3-T4: Developing interbody ankylosis appears degenerative on series
7, image 36. Prominent associated left lateral degenerative endplate
geodes, some containing gas. No definite stenosis. Moderate facet
hypertrophy.

T4-T5: Mild facet hypertrophy on the right.

T5-T6: Negative.

T6-T7: Disc space loss with trace vacuum disc and mild endplate
spurring. Mild facet hypertrophy. No definite stenosis.

T7-T8: Disc space loss with vacuum disc.  No stenosis is evident.

T8-T9: Mild vacuum disc and mostly anterior endplate spurring. No
definite stenosis.

T9-T10: Pronounced vacuum disc. Mild to moderate facet hypertrophy.
Mild bilateral T9 foraminal stenosis suspected.

T10-T11: Bilateral pedicle screws and spinal rods. Probable solid
posterior element arthrodesis. No definite stenosis.

T11-T12: Bilateral pedicle screws and spinal rods. Questionable
posterior element arthrodesis here. Mild vacuum disc. Facet
hypertrophy and mild right T11 foraminal stenosis.

T12-L1: Bilateral pedicle screws and spinal rods. Solid-appearing
posterior element arthrodesis with no stenosis.

L1-L2:  Partially visible interbody implant.
IMPRESSION: 1. No acute osseous abnormality in the thoracic spine.
2. Lower cervical through upper thoracic spinal fusion with
solid-appearing posterior element arthrodesis from T1 through T3,
and superimposed developing degenerative interbody ankylosis at
T3-T4.
3. Relatively mild thoracic spine degeneration T4-T5 through T8-T9.
4. Adjacent segment disease at T9-T10 with severe vacuum disc, mild
bilateral T9 foraminal stenosis.
5. T10 through upper lumbar posterior spinal fusion, with evidence
of pseudoarthrosis at T11-T12.
6. Calcified coronary artery and Aortic Atherosclerosis
([A8]-[A8]). Cholelithiasis. Small gastric hiatal hernia.

## 2021-02-17 IMAGING — CT CT CERVICAL SPINE W/O CM
3 of 4 series · 10 of 33 positions shown, 12 images · non-contrast
Comparison: MRI cervical spine [DATE].

CLINICAL DATA: 70-year-old female with prior surgery. Recurrent
pain.

EXAM:
CT CERVICAL SPINE WITHOUT CONTRAST
TECHNIQUE: Multidetector CT imaging of the cervical spine was performed without
intravenous contrast. Multiplanar CT image reconstructions were also
generated.

[Series 5: sagittal bone · sagittal · 0.33mm/px · 5 of 58 slices shown, 6 images]
[im 20/58  bone]
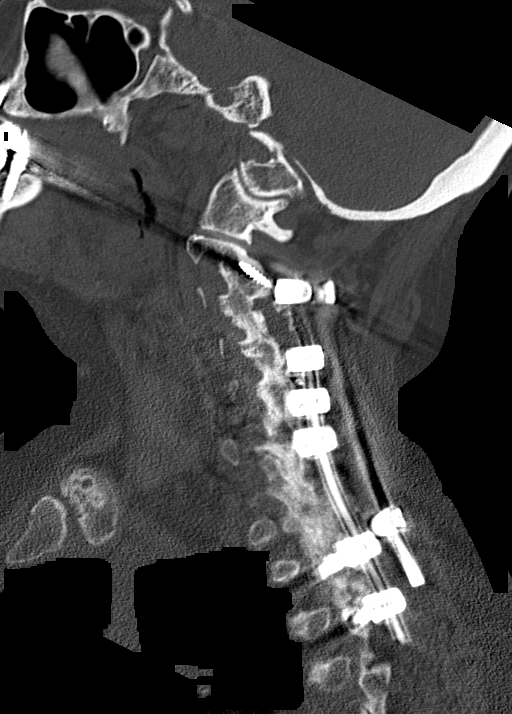
[im 24/58  bone]
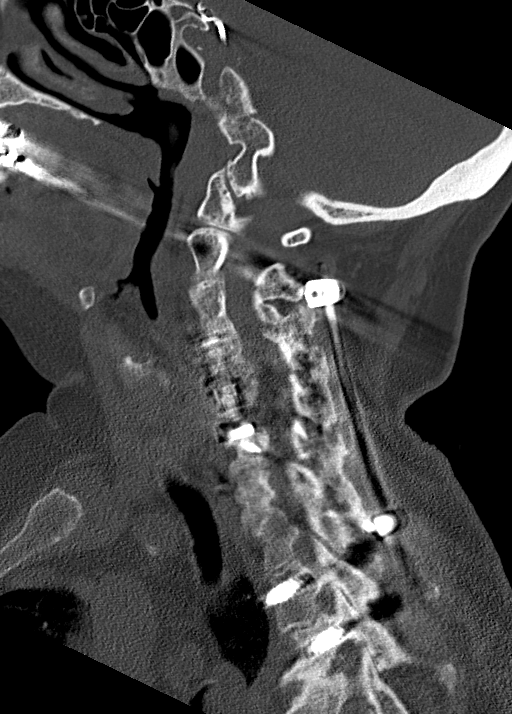
[im 29/58  soft-tissue]
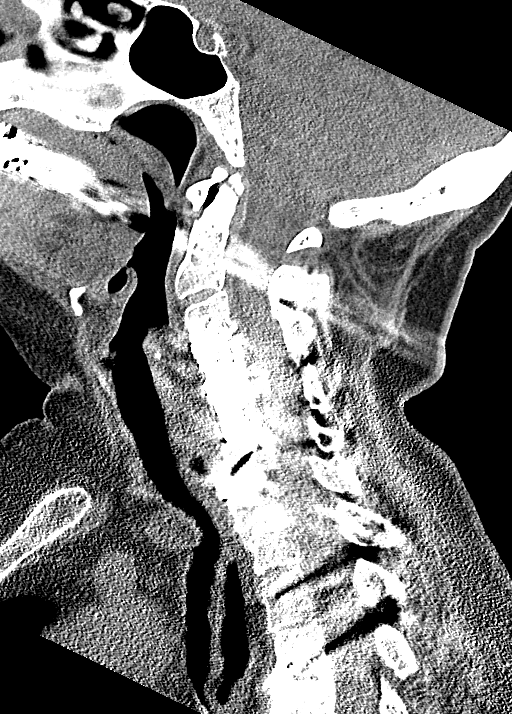
[im 29/58  bone]
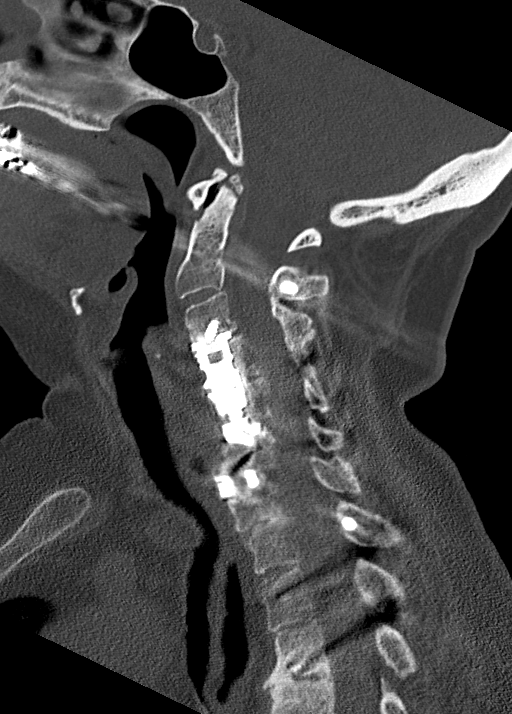
[im 34/58  bone]
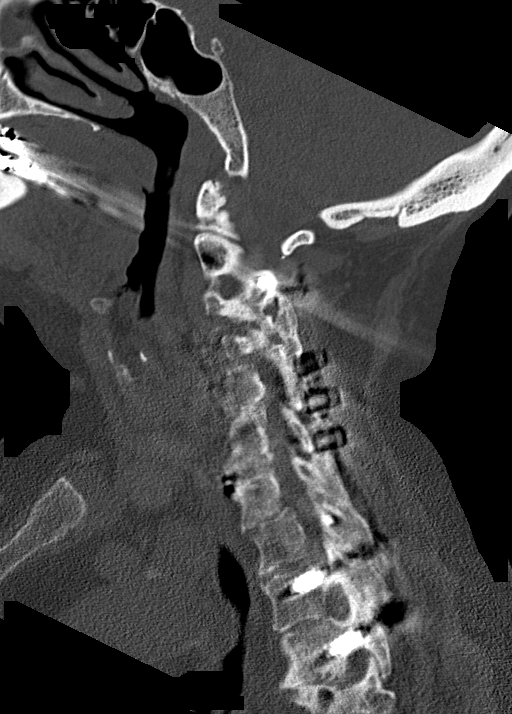
[im 39/58  bone]
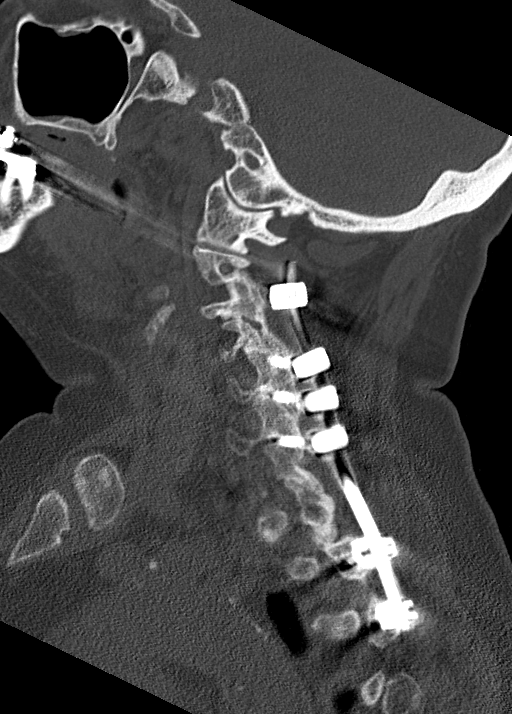

[Series 6: coronal bone · coronal · 0.23mm/px · 3 of 59 slices shown]
[im 15/59  bone]
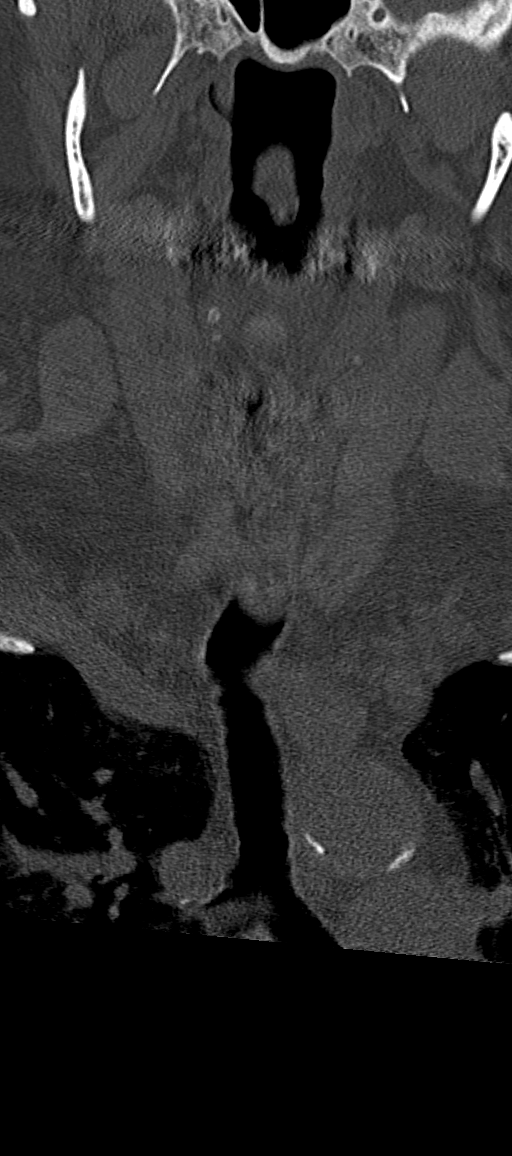
[im 25/59  bone]
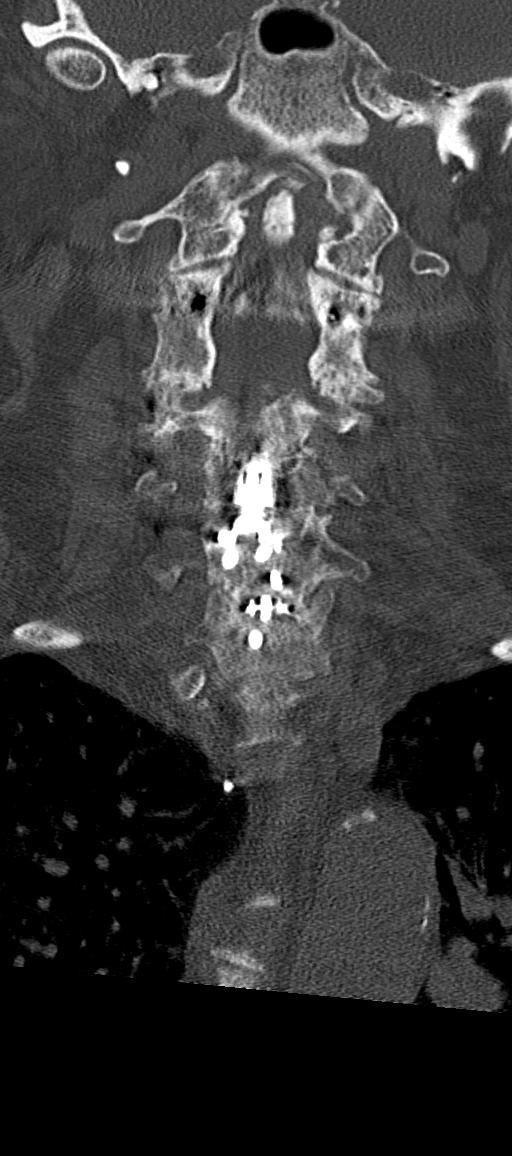
[im 35/59  bone]
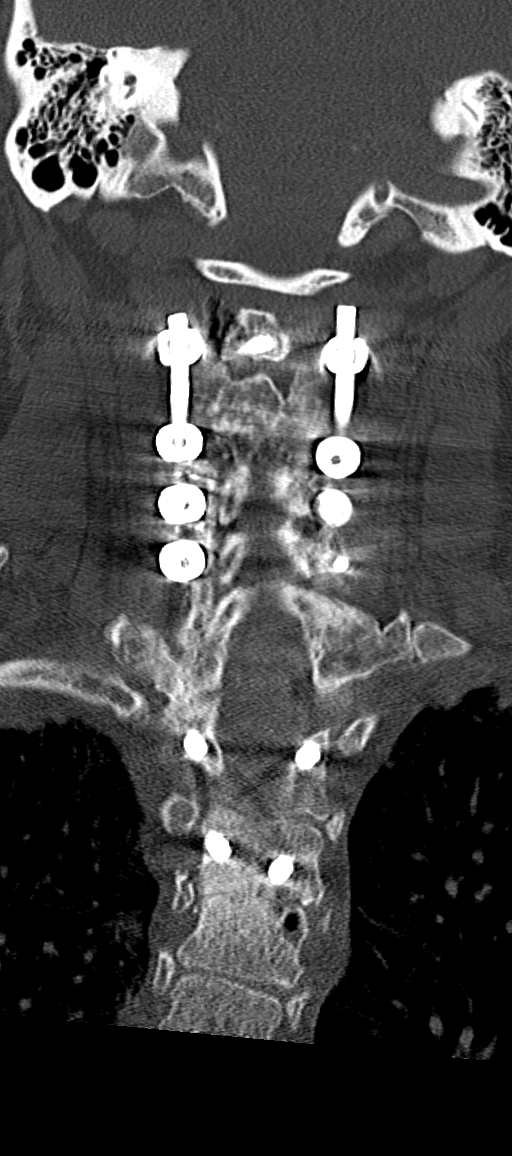

[Series 7: orthogonal bone · axial · 0.30mm/px · z∈[-224,-162]mm · 2 of 97 slices shown, 3 images]
[im 33/97  soft-tissue]
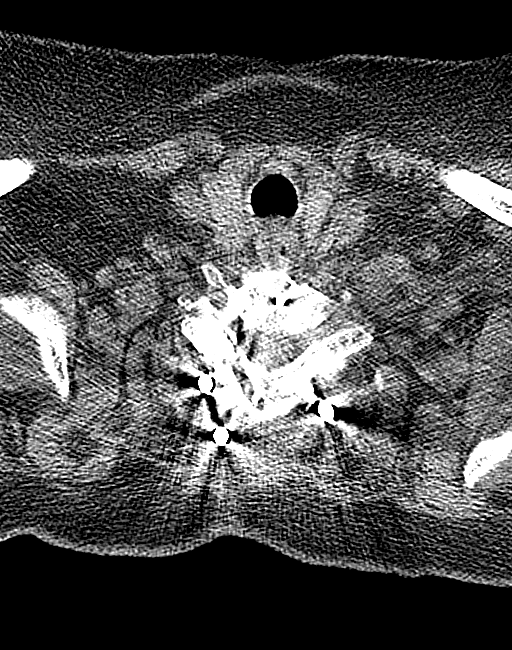
[im 33/97  bone]
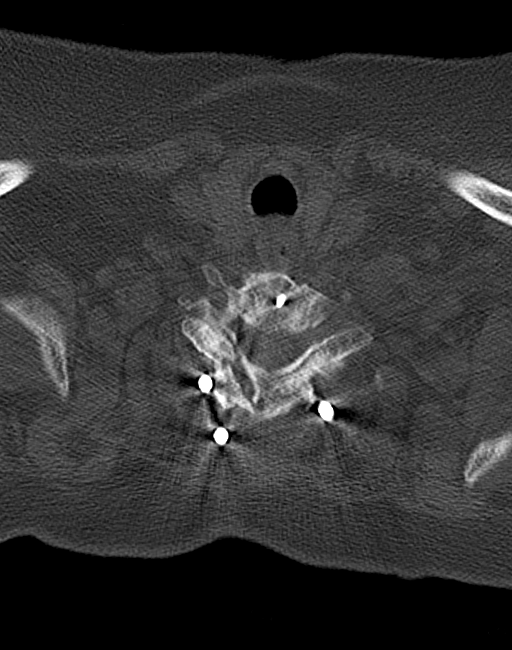
[im 65/97  bone]
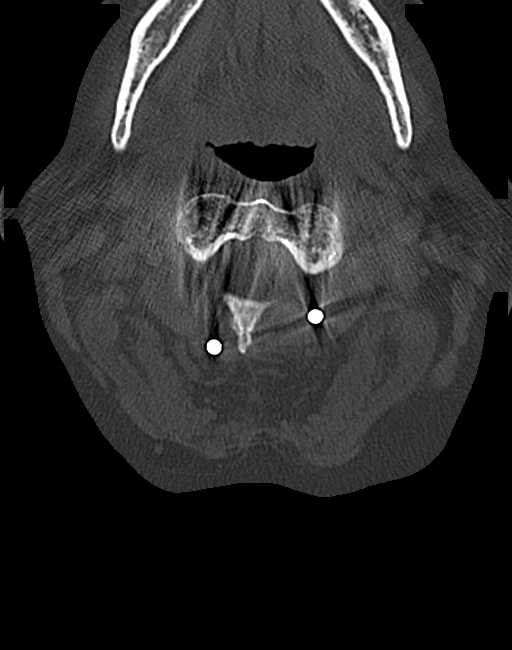

[10 of 33 positions shown; findings below may reference images not displayed]

FINDINGS: Alignment: Stable straightening of cervical lordosis from the MRI
earlier this month. Mild scoliosis. Maintained cervicothoracic
junction and bilateral posterior element alignment.

Skull base and vertebrae: Postoperative details are below.
Visualized skull base is intact. No atlanto-occipital dissociation.
Maintained C1-C2 alignment. No acute osseous abnormality identified.

Soft tissues and spinal canal: Postoperative changes to the
paraspinal soft tissues. No postoperative fluid collection is
evident. Partially retropharyngeal course of both cervical carotids.

Disc levels:

C1-C2: Prominent vacuum phenomena at the anterior joint space.

C2-C3: Solid arthrodesis with bilateral posterior element fusion
hardware.

C3-C4: Sequelae of C4 corpectomy and anterior fusion.
Solid-appearing posterior element arthrodesis with underlying
bilateral posterior element hypertrophy. Moderate bilateral C4
osseous neural foraminal stenosis.

C4-C5: Prior corpectomy. Bilateral posterior fusion hardware with
solid arthrodesis and no loosening. Moderate right C5 osseous neural
foraminal stenosis.

C5-C6: Prior C5 corpectomy with anterior and posterior fusion
hardware. Solid arthrodesis. No definite stenosis.

C6-C7: ACDF. Solid posterior element arthrodesis with residual facet
and endplate spurring. Mild osseous foraminal stenosis greater on
the left.

C7-T1: Posterior T1 fusion hardware with solid appearing posterior
and interbody arthrodesis. No definite stenosis.

Upper chest: Calcified aortic atherosclerosis. Negative upper lungs.
Thoracic spine is detailed separately today.

Other: Partially visible right ICA siphon region surgical aneurysm
clip, right temporal craniotomy.
IMPRESSION: 1. Extensive prior cervical spine surgery including C4 and C5
corpectomy.
Solid arthrodesis from C2 through C7.
Adjacent segment disease at C1-C2 in the form of vacuum phenomena at
the C1-odontoid articulation.
2. No acute osseous abnormality. Osseous neural foraminal stenosis
at the C4 and C5 nerve levels.
3. CT Thoracic Spine reported separately.

## 2021-02-21 ENCOUNTER — Ambulatory Visit
Admission: RE | Admit: 2021-02-21 | Discharge: 2021-02-21 | Disposition: A | Discharge status: Home / Self Care | Payer: Medicare Other | Source: Ambulatory Visit | Attending: Neurosurgery | Admitting: Neurosurgery

## 2021-02-21 DIAGNOSIS — G959 Disease of spinal cord, unspecified: Secondary | ICD-10-CM | POA: Diagnosis present

## 2021-02-21 DIAGNOSIS — M4802 Spinal stenosis, cervical region: Secondary | ICD-10-CM | POA: Insufficient documentation

## 2021-02-21 DIAGNOSIS — M5414 Radiculopathy, thoracic region: Secondary | ICD-10-CM | POA: Diagnosis present

## 2021-02-21 IMAGING — CR DG SCOLIOSIS EVAL COMPLETE SPINE 2-3V
1 series · 8 of 8 positions shown · non-contrast
Comparison: [DATE]

CLINICAL DATA: Post spinal fusion

EXAM:
DG SCOLIOSIS EVAL COMPLETE SPINE 2-3V

[Series 1: dg scoliosis eval complete spine 2 or 3  · 0.14mm/px · 8 of 8 slices shown]
[im 1/8]
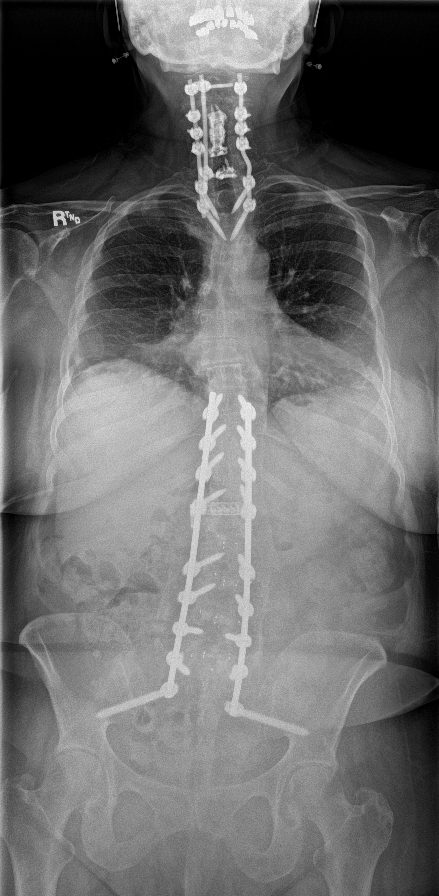
[im 2/8]
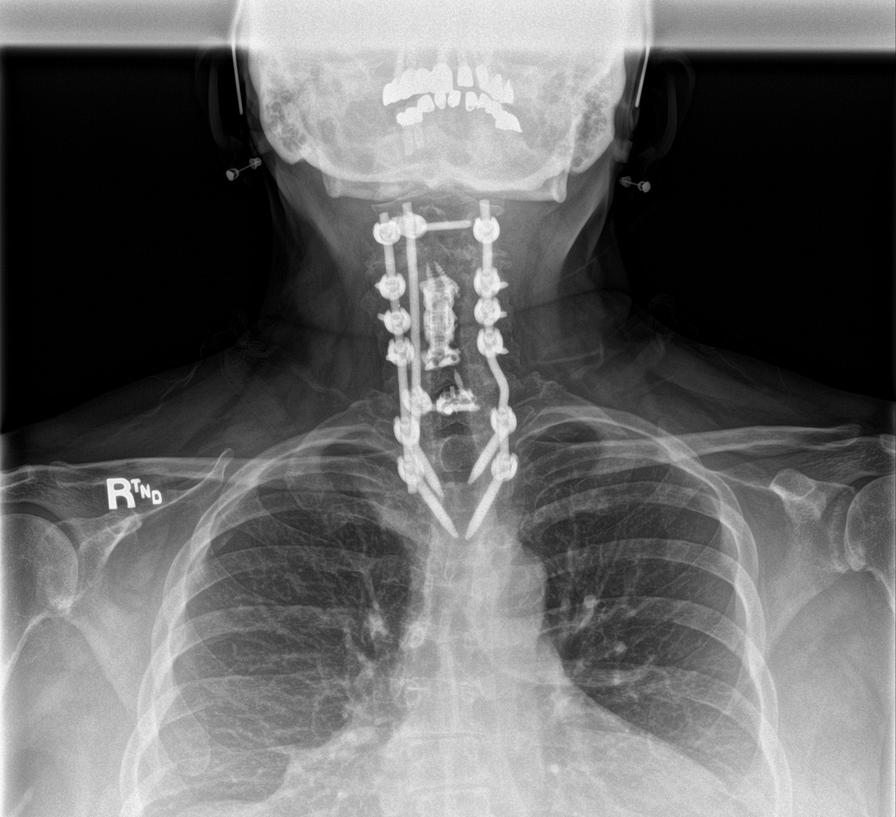
[im 3/8]
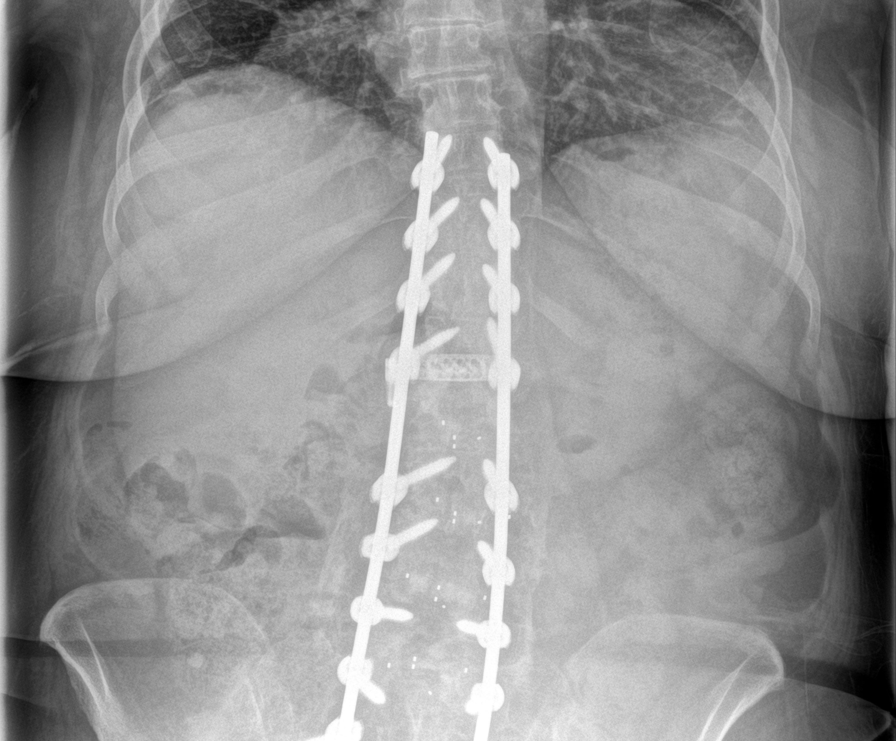
[im 4/8]
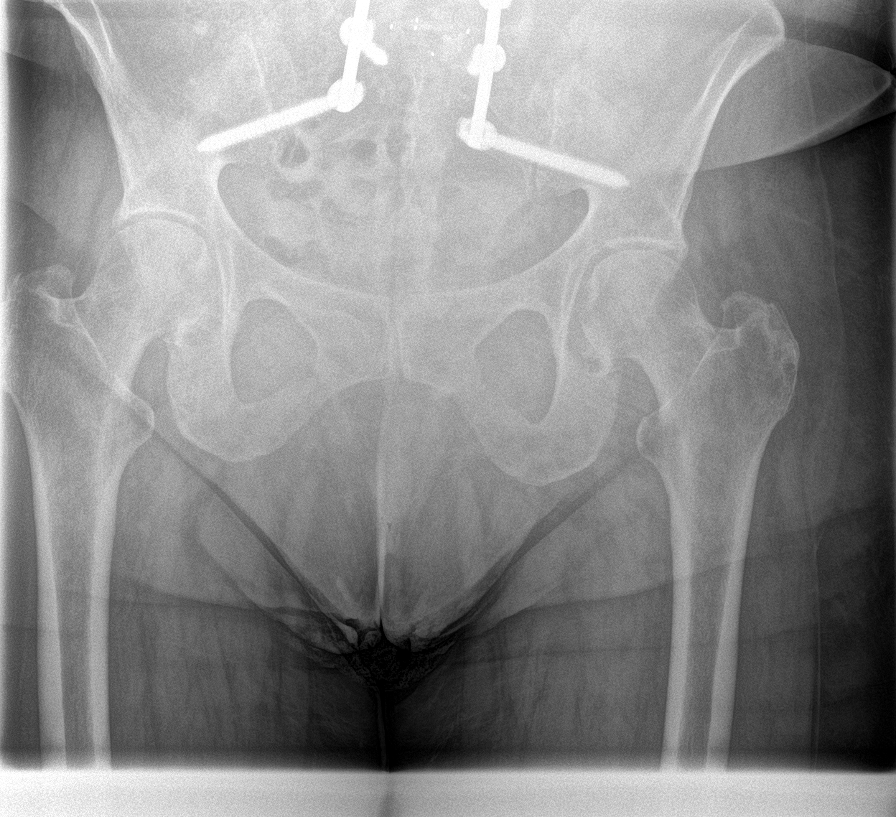
[im 5/8]
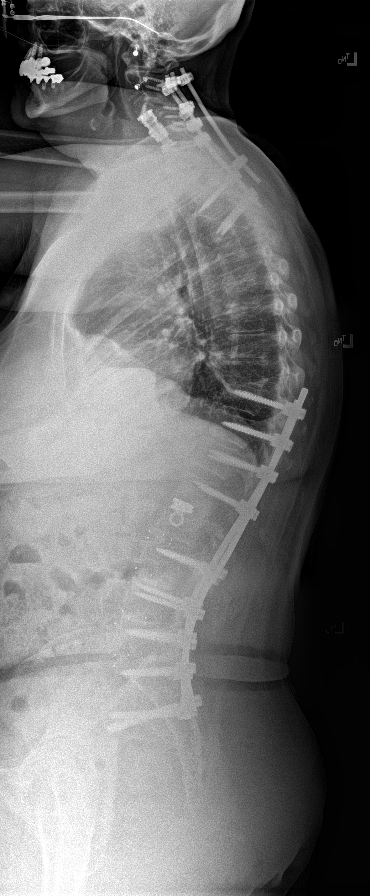
[im 6/8]
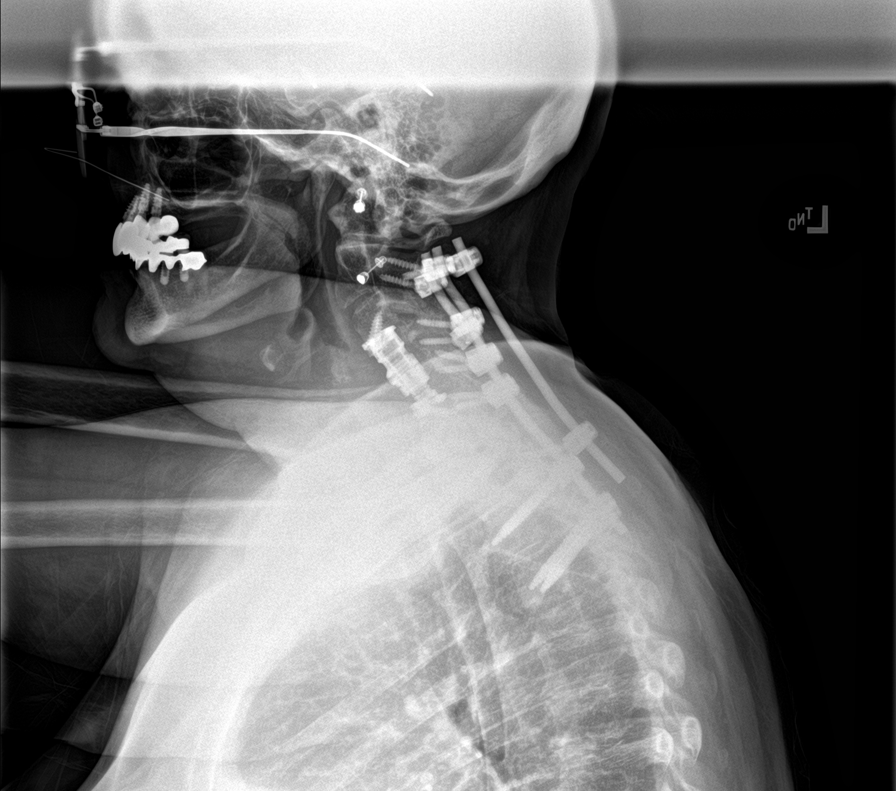
[im 7/8]
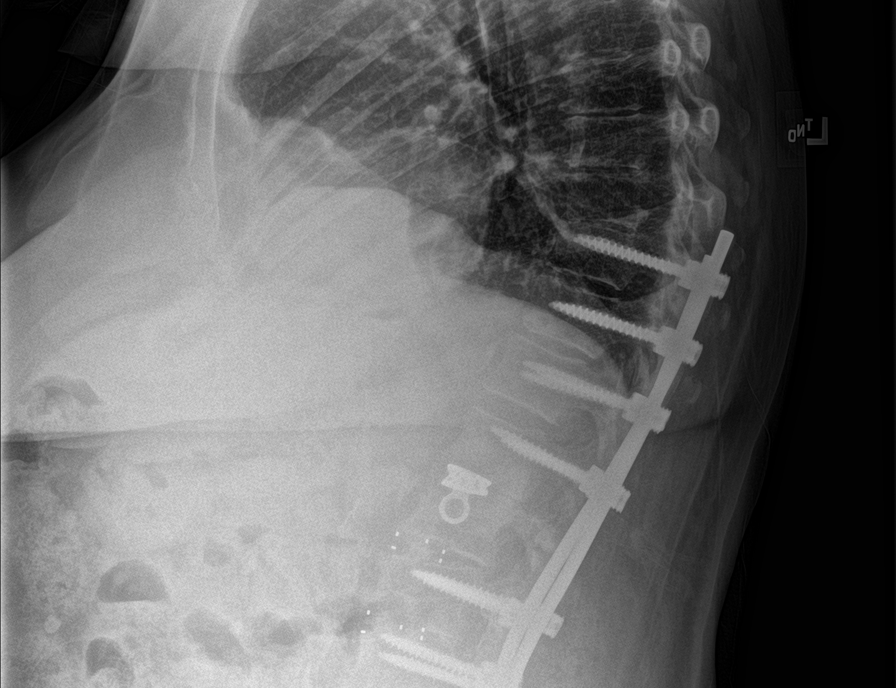
[im 8/8]
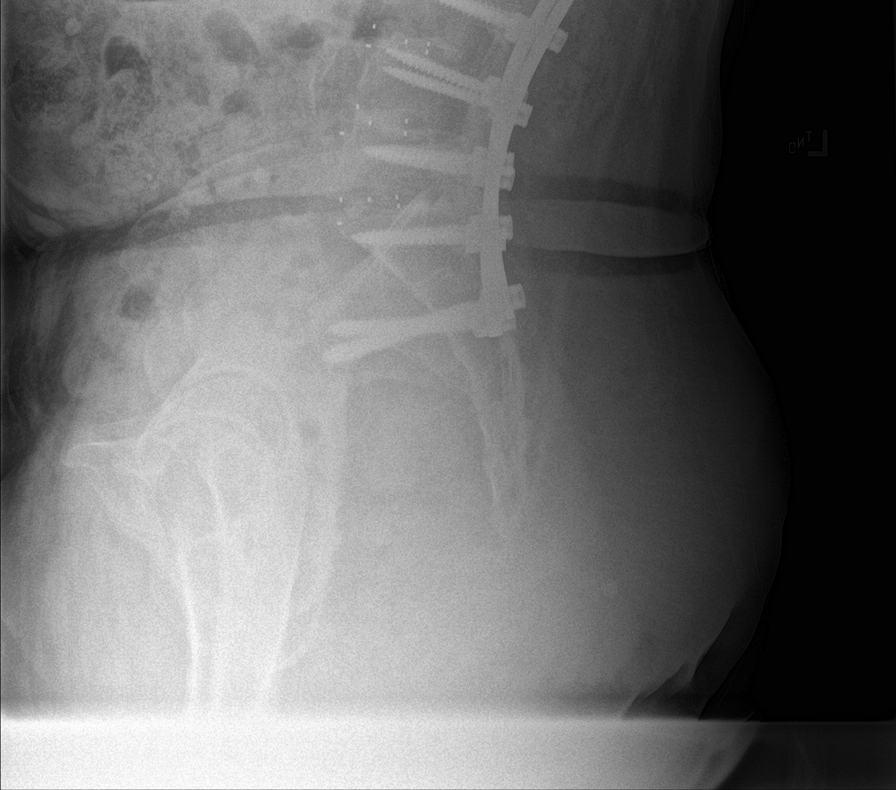

[8 of 8 positions shown; findings below may reference images not displayed]

FINDINGS: Prior cervicothoracic and thoracolumbar fusions.

Bones appear demineralized.

Twelve pairs of ribs.

No fracture, subluxation, or bone destruction.

Again identified dextroconvex scoliosis midthoracic spine measured
at 16.4 degrees from superior T6 to superior T9, previously
degrees at same level.

Atherosclerotic calcification aorta.
IMPRESSION: Prior cervicothoracic fusion.

Interval extension of lumbar fusion to thoracolumbar fusion.

Persistent dextroconvex scoliosis midthoracic spine little changed.

Aortic Atherosclerosis ([XH]-[XH]).

## 2021-02-28 ENCOUNTER — Other Ambulatory Visit: Payer: Medicare Other

## 2021-02-28 ENCOUNTER — Ambulatory Visit: Payer: Medicare Other

## 2021-03-24 ENCOUNTER — Ambulatory Visit (INDEPENDENT_AMBULATORY_CARE_PROVIDER_SITE_OTHER): Payer: Medicare Other | Admitting: Urology

## 2021-03-24 ENCOUNTER — Other Ambulatory Visit: Payer: Self-pay

## 2021-03-24 VITALS — BP 98/52 | HR 59

## 2021-03-24 DIAGNOSIS — N3946 Mixed incontinence: Secondary | ICD-10-CM | POA: Diagnosis not present

## 2021-03-24 MED ORDER — GEMTESA 75 MG PO TABS: 75.0000 mg | ORAL_TABLET | Freq: Every day | ORAL | 11 refills | Status: DC

## 2021-03-24 NOTE — Progress Notes (Signed)
03/24/2021 10:35 AM   Lisa Good 1950-10-25 [id number]  Referring provider: [name], MD [facility] [place],  [place] [address]  Chief Complaint  Patient presents with   Urinary Incontinence   HPI: I was consulted to assess the patient is urinary incontinence.  She has had 2 synthetic slings and one pubovaginal sling in [place].  She might leak with coughing sneezing with a hard cough but otherwise has no stress incontinence.  She is urge incontinence worse when she goes from a supine to standing position she can soak a pad.  She wears a 2 pads a day.  The positional changes significant trigger.  No bedwetting   She voids every 3-4 hours gets up twice at night   She recently had neck surgery and needs another back operation.  She has had 2 bladder infections in the last 6 months.   By history she recently failed Ditropan and Myrbetriq      well supported bladder neck and negative cough test.  Patient had about a 5 cm vaginal length.  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  I felt some scarring near the cuff.  She had no stress incontinence   Likely patient likely has primarily an overactive bladder with triggering and possibly mild stress incontinence.     Patient had back surgery and did not follow-up.  Last appointment more than a year ago.  She has had surgery on her neck mid back and low back.   She has urge incontinence.  Main symptom is she has high-volume leakage when she goes from a supine to standing position.  She leaks a small amount with coughing sneezing.  No bedwetting. She wears 2 pads a day with a varying amount of leakage   Voids every 2-3 hours gets up 3-4 times a night.   Cystoscopy: normal   Today Patient voided 63 mL during urodynamics with a maximum flow 11 mils per second and had a residual of 100 mL.  Maximum bladder capacity was 237 mL.  Bladder was unstable reaching pressure 4 cm of water.   She felt urgency and leaked.  At 200 mL she had mild leakage at 38 cm of water.  With a Valsalva maneuver at the same volume she leaked a mild amount at 70 cm of water.  At 263 mL she had moderate severe leakage at 64 cm of water.  During voluntary voiding she voided 247 mL with a max of flow 12 mils per second.  Maximum voiding pressure was 31 cm water.  She emptied well with a residual 26 mL.  EMG activity increased during voiding.  She did have a lot of urgency during the study.    Patient has mixed incontinence.  Approximately 80% of her voiding dysfunction is due to an overactive bladder with urge incontinence.  She has very mild stress incontinence.  I will see her back on the new beta 3 agonist.  If this fails I will offer all 4 third line therapies to her.  Based upon previous surgery and some medical comorbidities may not be the best patient for the study.  Patient has peripheral neuropathy  Today Frequency stable.  No infections.  80% improved on the new beta 3 agonist.  Not sure if it aggravated an upper respiratory tract infection and not related to vague abdominal discomfort in my opinion     PMH: Past Medical History:  Diagnosis Date   Allergy  seasonal   Arthritis    Depression    GERD (gastroesophageal reflux disease)    Hyperlipidemia    Hypertension    Iron deficiency anemia 11/01/2019   Neuromuscular disorder ([place])    peripheral neuropathy/feet   Sleep apnea    CPAP     Surgical History: Past Surgical History:  Procedure Laterality Date   BRAIN SURGERY  2002   aneurysm   COLONOSCOPY WITH PROPOFOL N/A 11/10/2019   Procedure: COLONOSCOPY WITH PROPOFOL;  Surgeon: Jonathon Bellows, MD;  Location: [facility];  Service: Endoscopy;  Laterality: N/A;   ESOPHAGOGASTRODUODENOSCOPY (EGD) WITH PROPOFOL N/A 11/10/2019   Procedure: ESOPHAGOGASTRODUODENOSCOPY (EGD) WITH PROPOFOL;  Surgeon: Jonathon Bellows, MD;  Location: [facility];  Service: Endoscopy;  Laterality: N/A;    EXTRACORPOREAL SHOCK WAVE LITHOTRIPSY Right 04/27/2019   Procedure: EXTRACORPOREAL SHOCK WAVE LITHOTRIPSY (ESWL);  Surgeon: Billey Co, MD;  Location: [facility];  Service: Urology;  Laterality: Right;   JOINT REPLACEMENT Right    knee   KNEE ARTHROPLASTY Left 01/12/2020   Procedure: COMPUTER ASSISTED TOTAL KNEE ARTHROPLASTY;  Surgeon: Dereck Leep, MD;  Location: [facility];  Service: Orthopedics;  Laterality: Left;   POLYPECTOMY  11/10/2019   Procedure: POLYPECTOMY;  Surgeon: Jonathon Bellows, MD;  Location: [facility];  Service: Endoscopy;;   SPINAL FUSION  07/10/2019   C4-5 corpectomy with C6-7 ACDF, C2-T3 spinal fusion   SPINE SURGERY     spinal fusion   TUBAL LIGATION      Home Medications:  Allergies as of 03/24/2021       Reactions   Contrast Media [iodinated Diagnostic Agents] Hives   Cephalexin Nausea Only        Medication List        Accurate as of March 24, 2021 10:35 AM. If you have any questions, ask your nurse or doctor.          Alpha-Lipoic Acid 600 MG Caps Take 1,200 mg by mouth daily.   amLODipine 10 MG tablet Commonly known as: NORVASC Take 10 mg by mouth daily.   atorvastatin 40 MG tablet Commonly known as: LIPITOR Take 40 mg by mouth daily.   gabapentin 800 MG tablet Commonly known as: NEURONTIN Take 1 tablet (800 mg total) by mouth 4 (four) times daily.   Gemtesa 75 MG Tabs Generic drug: Vibegron Take by mouth.   lisinopril 40 MG tablet Commonly known as: ZESTRIL Take 40 mg by mouth daily.   oxyCODONE-acetaminophen 10-325 MG tablet Commonly known as: Percocet Take 1 tablet by mouth every 6 (six) hours as needed for pain.   senna-docusate 8.6-50 MG tablet Commonly known as: Senokot-S Take 1 tablet by mouth 2 (two) times daily.   venlafaxine 75 MG tablet Commonly known as: EFFEXOR Take 75 mg by mouth 2 (two) times daily.   venlafaxine XR 150 MG 24 hr capsule Commonly known as: EFFEXOR-XR Take 150 mg by mouth daily with  breakfast.   vitamin B-12 1000 MCG tablet Commonly known as: CYANOCOBALAMIN Take 1 tablet (1,000 mcg total) by mouth daily.   Vitamin D3 25 MCG (1000 UT) Caps Take 1,000 Units by mouth daily.        Allergies:  Allergies  Allergen Reactions   Contrast Media [Iodinated Diagnostic Agents] Hives   Cephalexin Nausea Only    Family History: Family History  Problem Relation Age of Onset   Hypertension Mother    Hyperlipidemia Sister    Hypertension Sister    Depression Sister  Depression Brother     Social History:  reports that she quit smoking about 16 years ago. She has never used smokeless tobacco. She reports current alcohol use. She reports that she does not use drugs.  ROS:                                        Physical Exam: BP (!) 98/52   Pulse (!) 59   Constitutional:  Alert and oriented, No acute distress. HEENT: [place] AT, moist mucus membranes.  Trachea midline, no masses. C Laboratory Data: Lab Results  Component Value Date   WBC 6.2 01/02/2020   HGB 13.3 01/02/2020   HCT 41.3 01/02/2020   MCV 88.1 01/02/2020   PLT 271 01/02/2020    Lab Results  Component Value Date   CREATININE 1.02 (H) 01/02/2020    No results found for: PSA  No results found for: TESTOSTERONE  No results found for: HGBA1C  Urinalysis    Component Value Date/Time   COLORURINE YELLOW (A) 01/02/2020 1229   APPEARANCEUR Clear 01/13/2021 0912   LABSPEC 1.032 (H) 01/02/2020 1229   PHURINE 5.0 01/02/2020 1229   GLUCOSEU Negative 01/13/2021 0912   HGBUR NEGATIVE 01/02/2020 1229   BILIRUBINUR Negative 01/13/2021 0912   KETONESUR 5 (A) 01/02/2020 1229   PROTEINUR Negative 01/13/2021 0912   PROTEINUR NEGATIVE 01/02/2020 1229   NITRITE Negative 01/13/2021 0912   NITRITE NEGATIVE 01/02/2020 1229   LEUKOCYTESUR Negative 01/13/2021 0912   LEUKOCYTESUR MODERATE (A) 01/02/2020 1229    Pertinent Imaging:   Assessment & Plan: The patient has refractory  overactive bladder and urgency incontinence and is failed oxybutynin and Myrbetriq.  Based upon age and risk factors for dementia she should not try another antimuscarinic.  She is 80% improved with dramatic improvement in incontinence on Gemtesa and its significantly benefiting her quality life.  She has peripheral neuropathy and medical comorbidities which are less ideal for some of the refractory third line therapies.  I recommend for her to stay on the [facility].  Samples and prescription given.  Reevaluate in 6 weeks  There are no diagnoses linked to this encounter.  No follow-ups on file.  Reece Packer, MD  [facility] [address], [place], [place] [address] [phone]

## 2021-03-24 NOTE — Addendum Note (Signed)
Addended by: Alvera Novel on: 03/24/2021 10:56 AM   Modules accepted: Orders

## 2021-04-14 ENCOUNTER — Telehealth: Payer: Self-pay

## 2021-04-14 NOTE — Telephone Encounter (Signed)
Patient left a vmail on the triage line stating that Logan Bores is going to cost $375 a month. She is not able to afford. Is there a lower cost medication she can try? Please advise

## 2021-04-15 ENCOUNTER — Other Ambulatory Visit: Payer: Self-pay | Admitting: *Deleted

## 2021-04-15 MED ORDER — SOLIFENACIN SUCCINATE 5 MG PO TABS: 5.0000 mg | ORAL_TABLET | Freq: Every day | ORAL | 11 refills | Status: DC

## 2021-04-15 NOTE — Addendum Note (Signed)
Addended by: Chrystie Nose on: 04/15/2021 02:14 PM   Modules accepted: Orders

## 2021-04-15 NOTE — Telephone Encounter (Signed)
Sent Vesicare 5 mg 30x11  per macDiarmd note to Judson Roch

## 2021-05-01 ENCOUNTER — Telehealth: Payer: Self-pay

## 2021-05-01 ENCOUNTER — Encounter: Payer: Medicare Other | Admitting: Student in an Organized Health Care Education/Training Program

## 2021-05-05 ENCOUNTER — Ambulatory Visit (INDEPENDENT_AMBULATORY_CARE_PROVIDER_SITE_OTHER): Payer: Medicare Other | Admitting: Urology

## 2021-05-05 ENCOUNTER — Other Ambulatory Visit: Payer: Self-pay

## 2021-05-05 ENCOUNTER — Encounter: Payer: Self-pay | Admitting: Urology

## 2021-05-05 VITALS — BP 156/83 | HR 92 | Ht 65.0 in | Wt 201.0 lb

## 2021-05-05 DIAGNOSIS — N3946 Mixed incontinence: Secondary | ICD-10-CM | POA: Diagnosis not present

## 2021-05-05 MED ORDER — GEMTESA 75 MG PO TABS: 75.0000 mg | ORAL_TABLET | Freq: Every day | ORAL | 3 refills | Status: DC

## 2021-05-05 NOTE — Progress Notes (Signed)
05/05/2021 9:30 AM   Lisa Good May 19, 1951 [id number]  Referring provider: [name], MD [place] [facility] [place]  [place] [address]  Chief Complaint  Patient presents with   Urinary Incontinence    HPI: I was consulted to assess the patient is urinary incontinence.  She has had 2 synthetic slings and one pubovaginal sling in [place].  She might leak with coughing sneezing with a hard cough but otherwise has no stress incontinence.  She is urge incontinence worse when she goes from a supine to standing position she can soak a pad.  She wears a 2 pads a day.  The positional changes significant trigger.  No bedwetting   She voids every 3-4 hours gets up twice at night   She recently had neck surgery and needs another back operation.  She has had 2 bladder infections in the last 6 months.   By history she recently failed Ditropan and Myrbetriq      well supported bladder neck and negative cough test.  Patient had about a 5 cm vaginal length.  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  I felt some scarring near the cuff.  She had no stress incontinence   Likely patient likely has primarily an overactive bladder with triggering and possibly mild stress incontinence.     Patient had back surgery and did not follow-up.  Last appointment more than a year ago.  She has had surgery on her neck mid back and low back.   She has urge incontinence.  Main symptom is she has high-volume leakage when she goes from a supine to standing position.  She leaks a small amount with coughing sneezing.  No bedwetting. She wears 2 pads a day with a varying amount of leakage   Voids every 2-3 hours gets up 3-4 times a night.   Cystoscopy: normal   Today Patient voided 63 mL during urodynamics with a maximum flow 11 mils per second and had a residual of 100 mL.  Maximum bladder capacity was 237 mL.  Bladder was unstable reaching pressure 4 cm of water.   She felt urgency and leaked.  At 200 mL she had mild leakage at 38 cm of water.  With a Valsalva maneuver at the same volume she leaked a mild amount at 70 cm of water.  At 263 mL she had moderate severe leakage at 64 cm of water.  During voluntary voiding she voided 247 mL with a max of flow 12 mils per second.  Maximum voiding pressure was 31 cm water.  She emptied well with a residual 26 mL.  EMG activity increased during voiding.  She did have a lot of urgency during the study.     Patient has mixed incontinence.  Approximately 80% of her voiding dysfunction is due to an overactive bladder with urge incontinence.  She has very mild stress incontinence.  I will see her back on the new beta 3 agonist.  If this fails I will offer all 4 third line therapies to her.  Based upon previous surgery and some medical comorbidities may not be the best patient for the study.   Patient has peripheral neuropathy   Today Frequency stable.  No infections.  80% improved on the new beta 3 agonist.  Not sure if it aggravated an upper respiratory tract infection and not related to vague abdominal discomfort in my opinion  Today Patient dramatically better with 80 to 90% reduction  urge incontinence improving quality life new beta 3 agonist.  Frequency stable.  No infections.      PMH: Past Medical History:  Diagnosis Date   Allergy    seasonal   Arthritis    Depression    GERD (gastroesophageal reflux disease)    Hyperlipidemia    Hypertension    Iron deficiency anemia 11/01/2019   Neuromuscular disorder ([place])    peripheral neuropathy/feet   Sleep apnea    CPAP     Surgical History: Past Surgical History:  Procedure Laterality Date   BRAIN SURGERY  2002   aneurysm   COLONOSCOPY WITH PROPOFOL N/A 11/10/2019   Procedure: COLONOSCOPY WITH PROPOFOL;  Surgeon: Jonathon Bellows, MD;  Location: [facility];  Service: Endoscopy;  Laterality: N/A;   ESOPHAGOGASTRODUODENOSCOPY (EGD) WITH PROPOFOL N/A  11/10/2019   Procedure: ESOPHAGOGASTRODUODENOSCOPY (EGD) WITH PROPOFOL;  Surgeon: Jonathon Bellows, MD;  Location: [facility];  Service: Endoscopy;  Laterality: N/A;   EXTRACORPOREAL SHOCK WAVE LITHOTRIPSY Right 04/27/2019   Procedure: EXTRACORPOREAL SHOCK WAVE LITHOTRIPSY (ESWL);  Surgeon: Billey Co, MD;  Location: [facility];  Service: Urology;  Laterality: Right;   JOINT REPLACEMENT Right    knee   KNEE ARTHROPLASTY Left 01/12/2020   Procedure: COMPUTER ASSISTED TOTAL KNEE ARTHROPLASTY;  Surgeon: Dereck Leep, MD;  Location: [facility];  Service: Orthopedics;  Laterality: Left;   POLYPECTOMY  11/10/2019   Procedure: POLYPECTOMY;  Surgeon: Jonathon Bellows, MD;  Location: [facility];  Service: Endoscopy;;   SPINAL FUSION  07/10/2019   C4-5 corpectomy with C6-7 ACDF, C2-T3 spinal fusion   SPINE SURGERY     spinal fusion   TUBAL LIGATION      Home Medications:  Allergies as of 05/05/2021       Reactions   Contrast Media [iodinated Diagnostic Agents] Hives   Cephalexin Nausea Only        Medication List        Accurate as of May 05, 2021  9:30 AM. If you have any questions, ask your nurse or doctor.          Alpha-Lipoic Acid 600 MG Caps Take 1,200 mg by mouth daily.   amLODipine 10 MG tablet Commonly known as: NORVASC Take 10 mg by mouth daily.   atorvastatin 40 MG tablet Commonly known as: LIPITOR Take 40 mg by mouth daily.   gabapentin 800 MG tablet Commonly known as: NEURONTIN Take 1 tablet (800 mg total) by mouth 4 (four) times daily.   lisinopril 40 MG tablet Commonly known as: ZESTRIL Take 40 mg by mouth daily.   senna-docusate 8.6-50 MG tablet Commonly known as: Senokot-S Take 1 tablet by mouth 2 (two) times daily.   solifenacin 5 MG tablet Commonly known as: VESICARE Take 1 tablet (5 mg total) by mouth daily.   solifenacin 5 MG tablet Commonly known as: VESICARE Take 1 tablet (5 mg total) by mouth daily.   venlafaxine 75 MG  tablet Commonly known as: EFFEXOR Take 75 mg by mouth 2 (two) times daily.   venlafaxine XR 150 MG 24 hr capsule Commonly known as: EFFEXOR-XR Take 150 mg by mouth daily with breakfast.   vitamin B-12 1000 MCG tablet Commonly known as: CYANOCOBALAMIN Take 1 tablet (1,000 mcg total) by mouth daily.   Vitamin D3 25 MCG (1000 UT) Caps Take 1,000 Units by mouth daily.        Allergies:  Allergies  Allergen Reactions   Contrast Media [Iodinated Diagnostic Agents] Hives  Cephalexin Nausea Only    Family History: Family History  Problem Relation Age of Onset   Hypertension Mother    Hyperlipidemia Sister    Hypertension Sister    Depression Sister    Depression Brother     Social History:  reports that she quit smoking about 16 years ago. She has never used smokeless tobacco. She reports current alcohol use. She reports that she does not use drugs.  ROS:                                        Physical Exam: There were no vitals taken for this visit.  Constitutional:  Alert and oriented, No acute distress. HEENT: [place] AT, moist mucus membranes.  Trachea midline, no masses.   Laboratory Data: Lab Results  Component Value Date   WBC 6.2 01/02/2020   HGB 13.3 01/02/2020   HCT 41.3 01/02/2020   MCV 88.1 01/02/2020   PLT 271 01/02/2020    Lab Results  Component Value Date   CREATININE 1.02 (H) 01/02/2020    No results found for: PSA  No results found for: TESTOSTERONE  No results found for: HGBA1C  Urinalysis    Component Value Date/Time   COLORURINE YELLOW (A) 01/02/2020 1229   APPEARANCEUR Clear 01/13/2021 0912   LABSPEC 1.032 (H) 01/02/2020 1229   PHURINE 5.0 01/02/2020 1229   GLUCOSEU Negative 01/13/2021 0912   HGBUR NEGATIVE 01/02/2020 1229   BILIRUBINUR Negative 01/13/2021 0912   KETONESUR 5 (A) 01/02/2020 1229   PROTEINUR Negative 01/13/2021 0912   PROTEINUR NEGATIVE 01/02/2020 1229   NITRITE Negative 01/13/2021 0912    NITRITE NEGATIVE 01/02/2020 1229   LEUKOCYTESUR Negative 01/13/2021 0912   LEUKOCYTESUR MODERATE (A) 01/02/2020 1229    Pertinent Imaging:   Assessment & Plan: Logan Bores 90x3 sent to pharmacy and see in one year  There are no diagnoses linked to this encounter.  No follow-ups on file.  Reece Packer, MD  [facility] [address], [place], [place] [address] [phone]

## 2021-05-06 NOTE — Telephone Encounter (Signed)
Error

## 2021-05-21 ENCOUNTER — Ambulatory Visit
Admission: EM | Admit: 2021-05-21 | Discharge: 2021-05-21 | Disposition: A | Discharge status: Home / Self Care | Payer: Medicare Other | Attending: Internal Medicine | Admitting: Internal Medicine

## 2021-05-21 ENCOUNTER — Encounter: Payer: Self-pay | Admitting: Emergency Medicine

## 2021-05-21 ENCOUNTER — Ambulatory Visit (INDEPENDENT_AMBULATORY_CARE_PROVIDER_SITE_OTHER): Payer: Medicare Other

## 2021-05-21 ENCOUNTER — Other Ambulatory Visit: Payer: Self-pay

## 2021-05-21 DIAGNOSIS — J029 Acute pharyngitis, unspecified: Secondary | ICD-10-CM | POA: Diagnosis not present

## 2021-05-21 DIAGNOSIS — J069 Acute upper respiratory infection, unspecified: Secondary | ICD-10-CM | POA: Insufficient documentation

## 2021-05-21 DIAGNOSIS — J9811 Atelectasis: Secondary | ICD-10-CM | POA: Insufficient documentation

## 2021-05-21 DIAGNOSIS — R0602 Shortness of breath: Secondary | ICD-10-CM

## 2021-05-21 DIAGNOSIS — Z20822 Contact with and (suspected) exposure to covid-19: Secondary | ICD-10-CM | POA: Insufficient documentation

## 2021-05-21 LAB — POCT RAPID STREP A (OFFICE): Rapid Strep A Screen: NEGATIVE

## 2021-05-21 IMAGING — DX DG CHEST 2V
2 series · 2 of 2 positions shown · non-contrast
Comparison: [DATE].

CLINICAL DATA: Shortness of breath.

EXAM:
CHEST - 2 VIEW

[chest pa]
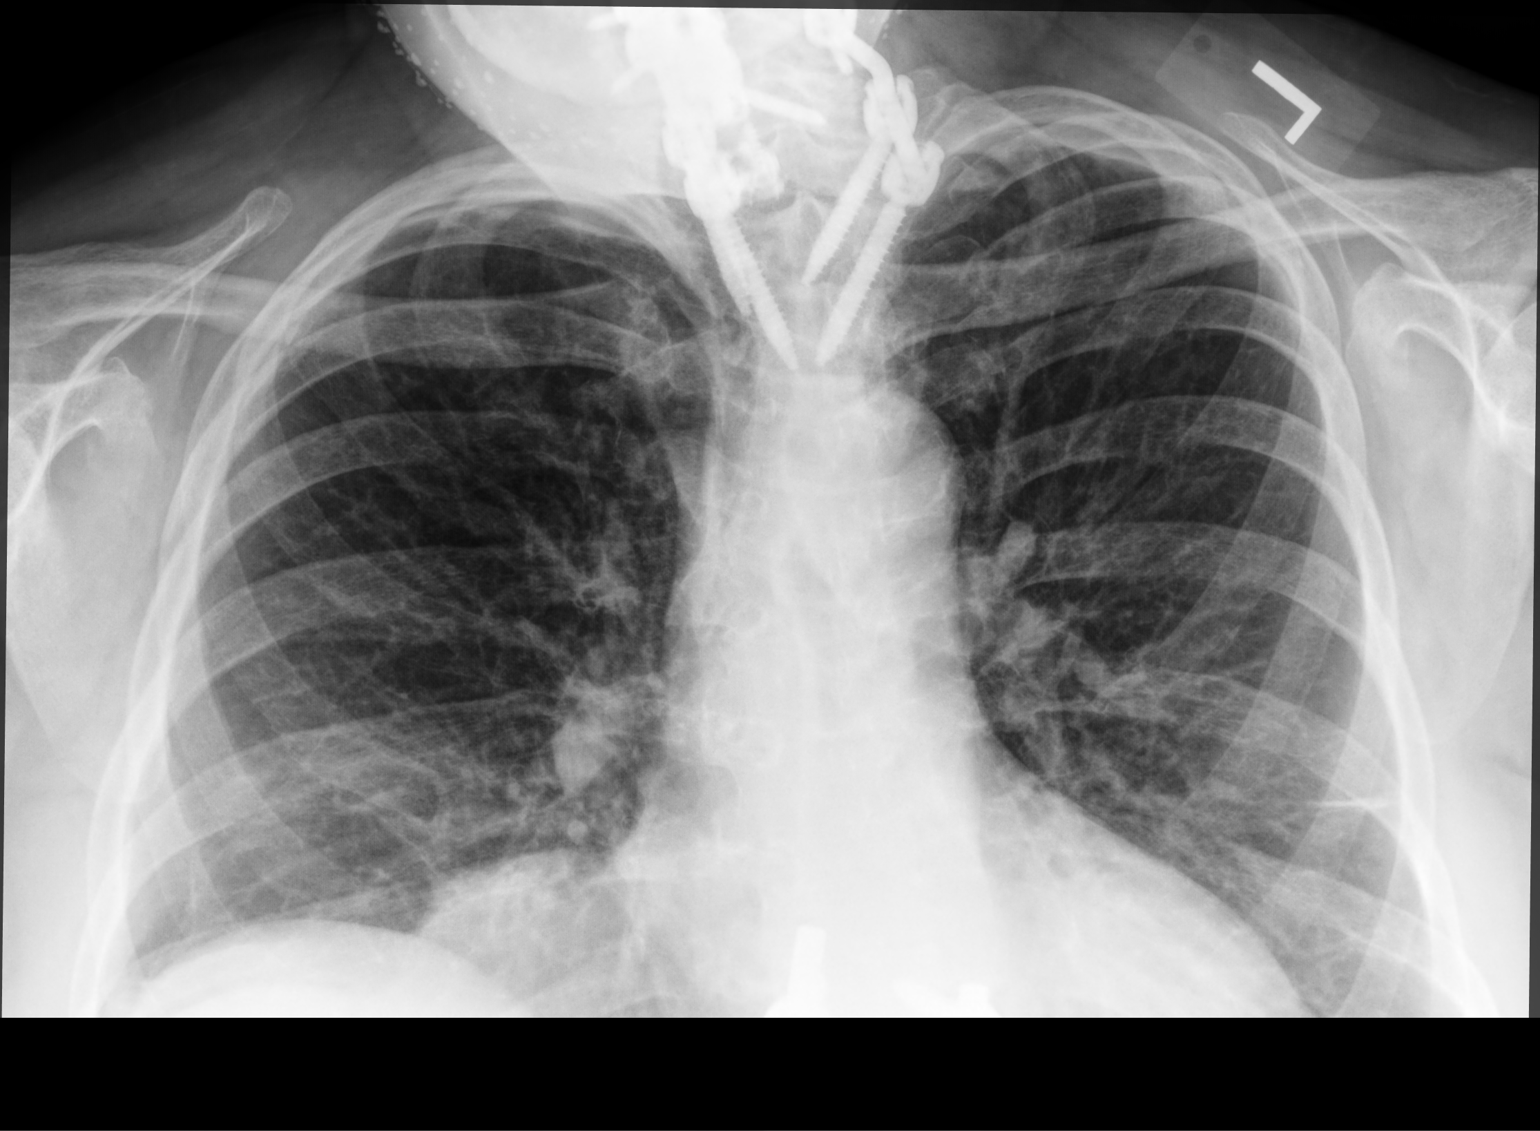

[chest lat]
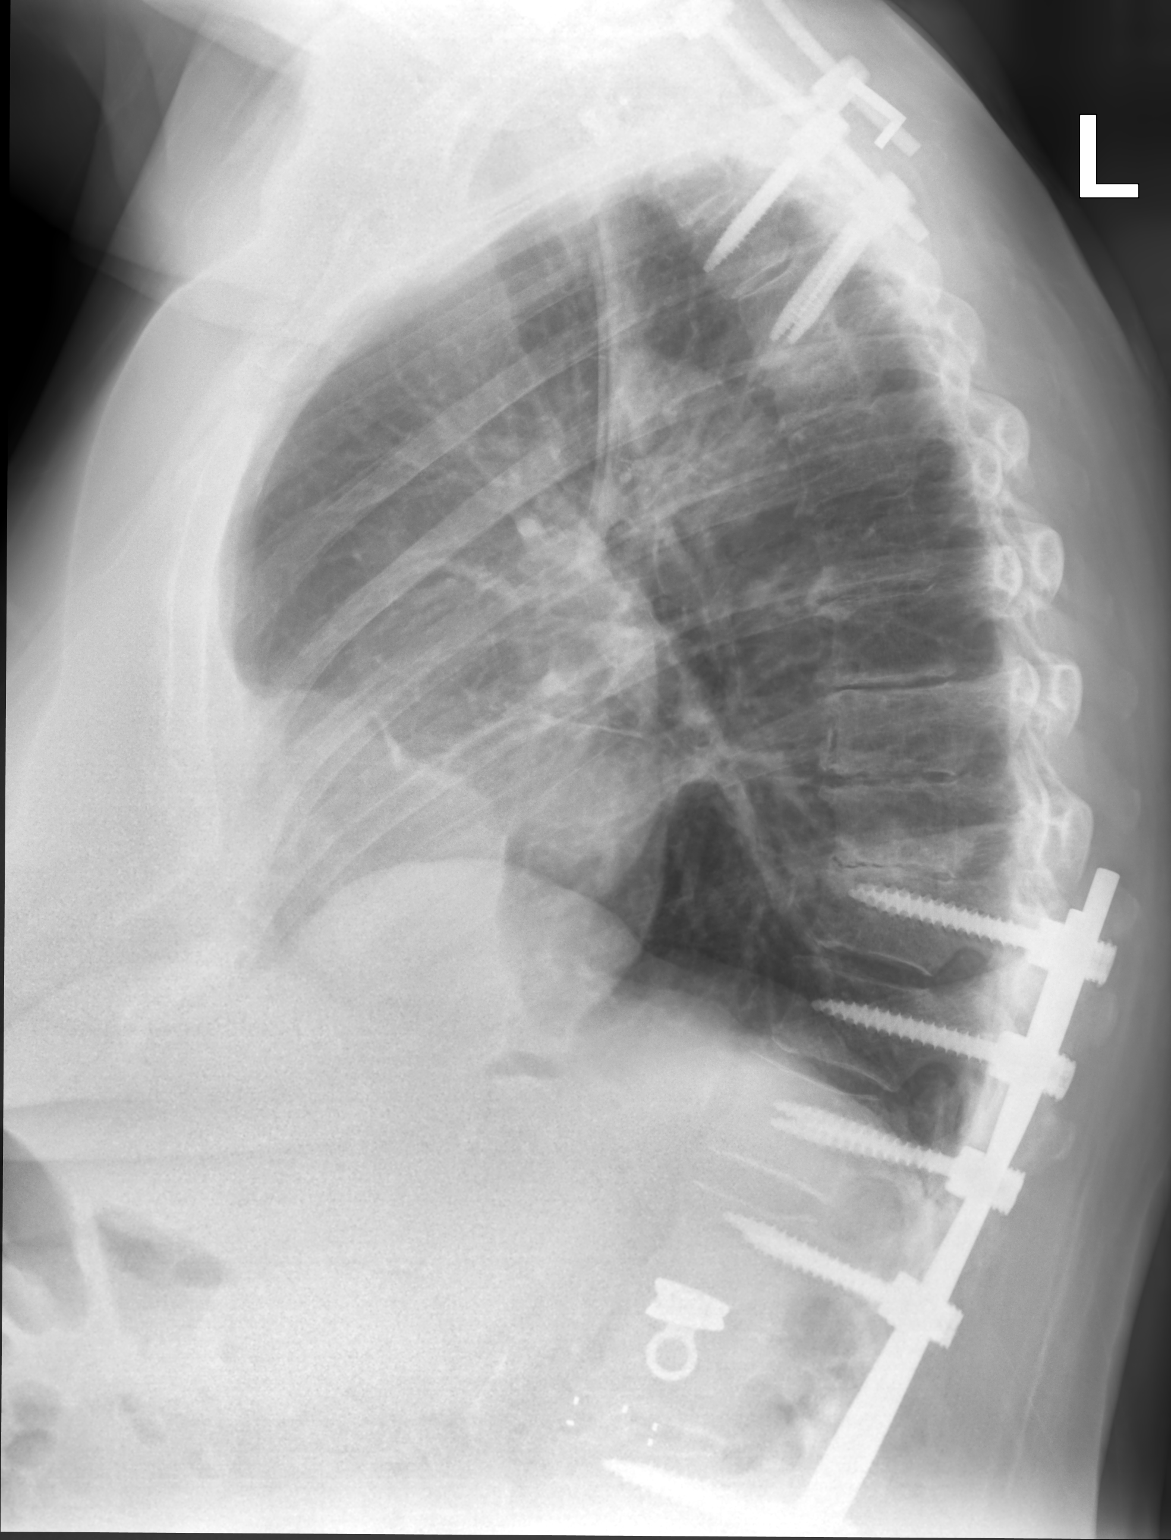

[2 of 2 positions shown; findings below may reference images not displayed]

FINDINGS: The heart size and mediastinal contours are within normal limits. No
pneumothorax or pleural effusion is noted. Minimal bibasilar
subsegmental atelectasis is noted. Status post surgical posterior
fusion of upper and lower thoracic spine.
IMPRESSION: Minimal bibasilar subsegmental atelectasis.

## 2021-05-21 MED ORDER — ALBUTEROL SULFATE HFA 108 (90 BASE) MCG/ACT IN AERS: 1.0000 | INHALATION_SPRAY | Freq: Four times a day (QID) | RESPIRATORY_TRACT | 0 refills | Status: DC | PRN

## 2021-05-21 MED ORDER — GUAIFENESIN ER 600 MG PO TB12: 600.0000 mg | ORAL_TABLET | Freq: Two times a day (BID) | ORAL | 0 refills | Status: DC

## 2021-05-21 NOTE — ED Triage Notes (Signed)
Patient c/o productive cough and congestion x 2 days, afebrile, no runny nose, some ear pain.  Home COVID test negative.  Patient is vaccinated for COVID.

## 2021-05-21 NOTE — ED Provider Notes (Signed)
[facility]    CSN: [id number] Arrival date & time: 05/21/21  1246      History   Chief Complaint Chief Complaint  Patient presents with   Cough    HPI Alycen Mack is a 70 y.o. female.   Patient presents with 2-day history of productive cough and congestion.  Denies any known fevers or sick contacts.  Had an at home COVID-19 test that was negative.  Denies any chest pain but has had some intermittent shortness of breath.  Denies any chronic lung diseases.  Patient has not yet taken any over-the-counter medications to alleviate symptoms.   Cough  Past Medical History:  Diagnosis Date   Allergy    seasonal   Arthritis    Depression    GERD (gastroesophageal reflux disease)    Hyperlipidemia    Hypertension    Iron deficiency anemia 11/01/2019   Neuromuscular disorder ([place])    peripheral neuropathy/feet   Sleep apnea    CPAP     Patient Active Problem List   Diagnosis Date Noted   Chronic right shoulder pain 11/11/2020   Right rotator cuff tear arthropathy 11/11/2020   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   Chronic radicular lumbar pain 12/04/2019   Iron deficiency anemia 11/01/2019   Primary osteoarthritis of left knee 10/17/2019   Primary osteoarthritis of right shoulder 10/17/2019   Hx of cervical spine surgery 08/21/2019   Pancolitis ([place]) 07/20/2019   Kyphosis of cervical region 07/14/2019   Cervical spondylosis with myelopathy 06/28/2019   Chronic low back pain 06/28/2019   Cervical spondylosis without myelopathy 11/09/2018   Foraminal stenosis of cervical region 11/09/2018   Cervical stenosis of spinal canal 11/09/2018   History of fusion of lumbar spine 11/09/2018   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   Postlaminectomy syndrome of lumbosacral region 11/09/2018   Chronic pain syndrome 11/09/2018   Severe obesity (BMI 35.0-39.9) with comorbidity ([place]) 08/18/2018   Primary osteoarthritis of right  knee 02/17/2017   Hyperlipidemia 12/02/2016   Incontinence in female 10/05/2016   Spondylolisthesis, lumbar region 05/05/2016   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   OSA (obstructive sleep apnea) 05/31/2015   Hypothyroidism 02/09/2014   Sleep disorder 02/09/2014   Asymptomatic hyperuricemia 08/29/2013   Polyarthritis 08/29/2013   Essential hypertension 06/28/2013   SUI (stress urinary incontinence, female) 06/20/2013   Insomnia 05/22/2013   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   Peripheral neuropathy 05/25/2012   Venous (peripheral) insufficiency 05/25/2012   Depression 10/03/2009   Restless legs syndrome (RLS) 10/03/2009   Fibromyalgia 02/18/2009   Gastro-esophageal reflux disease with esophagitis 10/23/2008   Generalized anxiety disorder 10/23/2008   GERD (gastroesophageal reflux disease) 10/23/2008   Pure hypercholesterolemia 10/23/2008    Past Surgical History:  Procedure Laterality Date   BRAIN SURGERY  2002   aneurysm   COLONOSCOPY WITH PROPOFOL N/A 11/10/2019   Procedure: COLONOSCOPY WITH PROPOFOL;  Surgeon: Jonathon Bellows, MD;  Location: [place];  Service: Endoscopy;  Laterality: N/A;   ESOPHAGOGASTRODUODENOSCOPY (EGD) WITH PROPOFOL N/A 11/10/2019   Procedure: ESOPHAGOGASTRODUODENOSCOPY (EGD) WITH PROPOFOL;  Surgeon: Jonathon Bellows, MD;  Location: [place];  Service: Endoscopy;  Laterality: N/A;   EXTRACORPOREAL SHOCK WAVE LITHOTRIPSY Right 04/27/2019   Procedure: EXTRACORPOREAL SHOCK WAVE LITHOTRIPSY (ESWL);  Surgeon: Billey Co, MD;  Location: [facility];  Service: Urology;  Laterality: Right;   JOINT REPLACEMENT Right    knee   KNEE ARTHROPLASTY Left 01/12/2020   Procedure: COMPUTER ASSISTED TOTAL KNEE ARTHROPLASTY;  Surgeon: Dereck Leep, MD;  Location: [facility];  Service: Orthopedics;  Laterality: Left;   POLYPECTOMY  11/10/2019   Procedure: POLYPECTOMY;  Surgeon: Jonathon Bellows, MD;  Location: [place];  Service: Endoscopy;;   SPINAL FUSION   07/10/2019   C4-5 corpectomy with C6-7 ACDF, C2-T3 spinal fusion   SPINE SURGERY     spinal fusion   TUBAL LIGATION      OB History   No obstetric history on file.      Home Medications    Prior to Admission medications   Medication Sig Start Date End Date Taking? Authorizing Provider  albuterol (VENTOLIN HFA) 108 (90 Base) MCG/ACT inhaler Inhale 1-2 puffs into the lungs every 6 (six) hours as needed for wheezing or shortness of breath. 05/21/21  Yes Odis Luster, FNP  Alpha-Lipoic Acid 600 MG CAPS Take 1,200 mg by mouth daily.    Yes [provider]  amLODipine (NORVASC) 10 MG tablet Take 10 mg by mouth daily.   Yes [provider]  atorvastatin (LIPITOR) 40 MG tablet Take 40 mg by mouth daily.   Yes [provider]  Cholecalciferol (VITAMIN D3) 25 MCG (1000 UT) CAPS Take 1,000 Units by mouth daily.    Yes [provider]  gabapentin (NEURONTIN) 800 MG tablet Take 1 tablet (800 mg total) by mouth 4 (four) times daily. 01/09/21  Yes Gillis Santa, MD  guaiFENesin (MUCINEX) 600 MG 12 hr tablet Take 1 tablet (600 mg total) by mouth 2 (two) times daily. 05/21/21  Yes Odis Luster, FNP  lisinopril (PRINIVIL,ZESTRIL) 40 MG tablet Take 40 mg by mouth daily.   Yes [provider]  senna-docusate (SENOKOT-S) 8.6-50 MG tablet Take 1 tablet by mouth 2 (two) times daily. 01/14/20  Yes Duanne Guess, PA-C  solifenacin (VESICARE) 5 MG tablet Take 1 tablet (5 mg total) by mouth daily. 04/15/21  Yes MacDiarmid, Nicki Reaper, MD  solifenacin (VESICARE) 5 MG tablet Take 1 tablet (5 mg total) by mouth daily. 04/15/21  Yes MacDiarmid, Nicki Reaper, MD  venlafaxine (EFFEXOR) 75 MG tablet Take 75 mg by mouth 2 (two) times daily.   Yes [provider]  venlafaxine XR (EFFEXOR-XR) 150 MG 24 hr capsule Take 150 mg by mouth daily with breakfast.  10/26/19  Yes [provider]  Vibegron (GEMTESA) 75 MG TABS Take 75 mg by mouth daily. 05/05/21  Yes MacDiarmid,  Nicki Reaper, MD  vitamin B-12 (CYANOCOBALAMIN) 1000 MCG tablet Take 1 tablet (1,000 mcg total) by mouth daily. 11/01/19  Yes Earlie Server, MD    Family History Family History  Problem Relation Age of Onset   Hypertension Mother    Hyperlipidemia Sister    Hypertension Sister    Depression Sister    Depression Brother     Social History Social History   Tobacco Use   Smoking status: Former    Types: Cigarettes    Quit date: 10/29/2004    Years since quitting: 16.5   Smokeless tobacco: Never  Vaping Use   Vaping Use: Never used  Substance Use Topics   Alcohol use: Yes    Comment: rarely has a glass of wine   Drug use: Never     Allergies   Contrast media [iodinated diagnostic agents] and Cephalexin   Review of Systems Review of Systems  Respiratory:  Positive for cough.    Per HPI Physical Exam Triage Vital Signs ED Triage Vitals  Enc Vitals Group     BP 05/21/21 1253 Marland Kitchen)  159/78     Pulse Rate 05/21/21 1253 94     Resp 05/21/21 1253 16     Temp 05/21/21 1253 98.1 F (36.7 C)     Temp Source 05/21/21 1253 Oral     SpO2 05/21/21 1253 97 %     Weight 05/21/21 1256 195 lb (88.5 kg)     Height 05/21/21 1256 5' 5"  (1.651 m)     Head Circumference --      Peak Flow --      Pain Score 05/21/21 1255 7     Pain Loc --      Pain Edu? --      Excl. in [place]? --    No data found.  Updated Vital Signs BP (!) 159/78 (BP Location: Right Arm)   Pulse 94   Temp 98.1 F (36.7 C) (Oral)   Resp 16   Ht 5' 5"  (1.651 m)   Wt 195 lb (88.5 kg)   SpO2 97%   BMI 32.45 kg/m   Visual Acuity Right Eye Distance:   Left Eye Distance:   Bilateral Distance:    Right Eye Near:   Left Eye Near:    Bilateral Near:     Physical Exam Constitutional:      General: She is not in acute distress.    Appearance: Normal appearance.  HENT:     Head: Normocephalic and atraumatic.     Right Ear: Tympanic membrane and ear canal normal.     Left Ear: Tympanic membrane and ear canal normal.      Nose: Congestion present.     Mouth/Throat:     Mouth: Mucous membranes are moist.     Pharynx: Posterior oropharyngeal erythema present.  Eyes:     Extraocular Movements: Extraocular movements intact.     Conjunctiva/sclera: Conjunctivae normal.     Pupils: Pupils are equal, round, and reactive to light.  Cardiovascular:     Rate and Rhythm: Normal rate and regular rhythm.     Pulses: Normal pulses.     Heart sounds: Normal heart sounds.  Pulmonary:     Effort: Pulmonary effort is normal. No respiratory distress.     Breath sounds: Rhonchi present. No wheezing.  Abdominal:     General: Abdomen is flat. Bowel sounds are normal.     Palpations: Abdomen is soft.  Musculoskeletal:        General: Normal range of motion.     Cervical back: Normal range of motion.  Skin:    General: Skin is warm and dry.  Neurological:     General: No focal deficit present.     Mental Status: She is alert and oriented to person, place, and time. Mental status is at baseline.  Psychiatric:        Mood and Affect: Mood normal.        Behavior: Behavior normal.     UC Treatments / Results  Labs (all labs ordered are listed, but only abnormal results are displayed) Labs Reviewed  CULTURE, GROUP A STREP ([place])  NOVEL CORONAVIRUS, NAA  POCT RAPID STREP A (OFFICE)    EKG   Radiology DG Chest 2 View  Result Date: 05/21/2021 CLINICAL DATA:  Shortness of breath. EXAM: CHEST - 2 VIEW COMPARISON:  July 20, 2019. FINDINGS: The heart size and mediastinal contours are within normal limits. No pneumothorax or pleural effusion is noted. Minimal bibasilar subsegmental atelectasis is noted. Status post surgical posterior fusion of upper and lower thoracic spine. IMPRESSION:  Minimal bibasilar subsegmental atelectasis. Electronically Signed   By: Marijo Conception M.D.   On: 05/21/2021 13:56    Procedures Procedures (including critical care time)  Medications Ordered in UC Medications - No data to  display  Initial Impression / Assessment and Plan / UC Course  I have reviewed the triage vital signs and the nursing notes.  Pertinent labs & imaging results that were available during my care of the patient were reviewed by me and considered in my medical decision making (see chart for details).     Chest x-ray showed some mild atelectasis.  Will prescribe albuterol inhaler and Mucinex to help alleviate the symptoms.  Low concern for pneumonia.  Rapid strep test was negative in urgent care today.  Throat culture and COVID-19 viral swab are pending.  Discussed clinical course with Dr. Lanny Cramp (supervising physician).  Advised patient to go to the hospital if shortness of breath or symptoms worsen.Discussed strict return precautions. Patient verbalized understanding and is agreeable with plan.  Final Clinical Impressions(s) / UC Diagnoses   Final diagnoses:  Encounter for laboratory testing for COVID-19 virus  Sore throat  Shortness of breath  Acute upper respiratory infection  Atelectasis     Discharge Instructions      Your COVID-19 test and throat culture are pending.  We will call if these are positive.  You have been prescribed albuterol inhaler and Mucinex to take to help with symptoms.  Please follow-up if symptoms do not improve.  Go to the hospital if symptoms worsen.     ED Prescriptions     Medication Sig Dispense Auth. Provider   albuterol (VENTOLIN HFA) 108 (90 Base) MCG/ACT inhaler Inhale 1-2 puffs into the lungs every 6 (six) hours as needed for wheezing or shortness of breath. 1 each Odis Luster, FNP   guaiFENesin (MUCINEX) 600 MG 12 hr tablet Take 1 tablet (600 mg total) by mouth 2 (two) times daily. 30 tablet Odis Luster, FNP      PDMP not reviewed this encounter.   Odis Luster, FNP 05/21/21 1505

## 2021-05-21 NOTE — Discharge Instructions (Addendum)
Your COVID-19 test and throat culture are pending.  We will call if these are positive.  You have been prescribed albuterol inhaler and Mucinex to take to help with symptoms.  Please follow-up if symptoms do not improve.  Go to the hospital if symptoms worsen.

## 2021-05-22 LAB — SARS-COV-2, NAA 2 DAY TAT

## 2021-05-22 LAB — NOVEL CORONAVIRUS, NAA: SARS-CoV-2, NAA: NOT DETECTED

## 2021-05-24 LAB — CULTURE, GROUP A STREP (THRC)

## 2021-07-10 ENCOUNTER — Other Ambulatory Visit: Payer: Self-pay

## 2021-07-10 ENCOUNTER — Ambulatory Visit
Admission: EM | Admit: 2021-07-10 | Discharge: 2021-07-10 | Disposition: A | Discharge status: Home / Self Care | Payer: Medicare Other | Attending: Urgent Care | Admitting: Urgent Care

## 2021-07-10 ENCOUNTER — Emergency Department (HOSPITAL_COMMUNITY): Payer: Medicare Other

## 2021-07-10 ENCOUNTER — Encounter (HOSPITAL_COMMUNITY): Payer: Self-pay | Admitting: *Deleted

## 2021-07-10 ENCOUNTER — Ambulatory Visit (INDEPENDENT_AMBULATORY_CARE_PROVIDER_SITE_OTHER): Payer: Medicare Other

## 2021-07-10 ENCOUNTER — Emergency Department (HOSPITAL_COMMUNITY)
Admission: EM | Admit: 2021-07-10 | Discharge: 2021-07-10 | Disposition: A | Discharge status: Home / Self Care | Payer: Medicare Other | Attending: Emergency Medicine | Admitting: Emergency Medicine

## 2021-07-10 DIAGNOSIS — Z87891 Personal history of nicotine dependence: Secondary | ICD-10-CM | POA: Insufficient documentation

## 2021-07-10 DIAGNOSIS — Z20822 Contact with and (suspected) exposure to covid-19: Secondary | ICD-10-CM | POA: Diagnosis not present

## 2021-07-10 DIAGNOSIS — E039 Hypothyroidism, unspecified: Secondary | ICD-10-CM | POA: Insufficient documentation

## 2021-07-10 DIAGNOSIS — J9383 Other pneumothorax: Secondary | ICD-10-CM | POA: Diagnosis not present

## 2021-07-10 DIAGNOSIS — R0981 Nasal congestion: Secondary | ICD-10-CM | POA: Diagnosis not present

## 2021-07-10 DIAGNOSIS — Z79899 Other long term (current) drug therapy: Secondary | ICD-10-CM | POA: Insufficient documentation

## 2021-07-10 DIAGNOSIS — I10 Essential (primary) hypertension: Secondary | ICD-10-CM | POA: Diagnosis not present

## 2021-07-10 DIAGNOSIS — R059 Cough, unspecified: Secondary | ICD-10-CM | POA: Insufficient documentation

## 2021-07-10 DIAGNOSIS — R0789 Other chest pain: Secondary | ICD-10-CM

## 2021-07-10 DIAGNOSIS — R079 Chest pain, unspecified: Secondary | ICD-10-CM | POA: Diagnosis not present

## 2021-07-10 DIAGNOSIS — J40 Bronchitis, not specified as acute or chronic: Secondary | ICD-10-CM

## 2021-07-10 DIAGNOSIS — J069 Acute upper respiratory infection, unspecified: Secondary | ICD-10-CM

## 2021-07-10 DIAGNOSIS — R0989 Other specified symptoms and signs involving the circulatory and respiratory systems: Secondary | ICD-10-CM

## 2021-07-10 DIAGNOSIS — Z96653 Presence of artificial knee joint, bilateral: Secondary | ICD-10-CM | POA: Diagnosis not present

## 2021-07-10 DIAGNOSIS — R058 Other specified cough: Secondary | ICD-10-CM

## 2021-07-10 LAB — RESP PANEL BY RT-PCR (FLU A&B, COVID) ARPGX2
Influenza A by PCR: NEGATIVE
Influenza B by PCR: NEGATIVE
SARS Coronavirus 2 by RT PCR: NEGATIVE

## 2021-07-10 IMAGING — CR DG CHEST 2V
2 series · 2 of 2 positions shown · non-contrast
Comparison: Radiograph [DATE]

CLINICAL DATA: Shortness of breath

EXAM:
CHEST - 2 VIEW

[w chest lat]
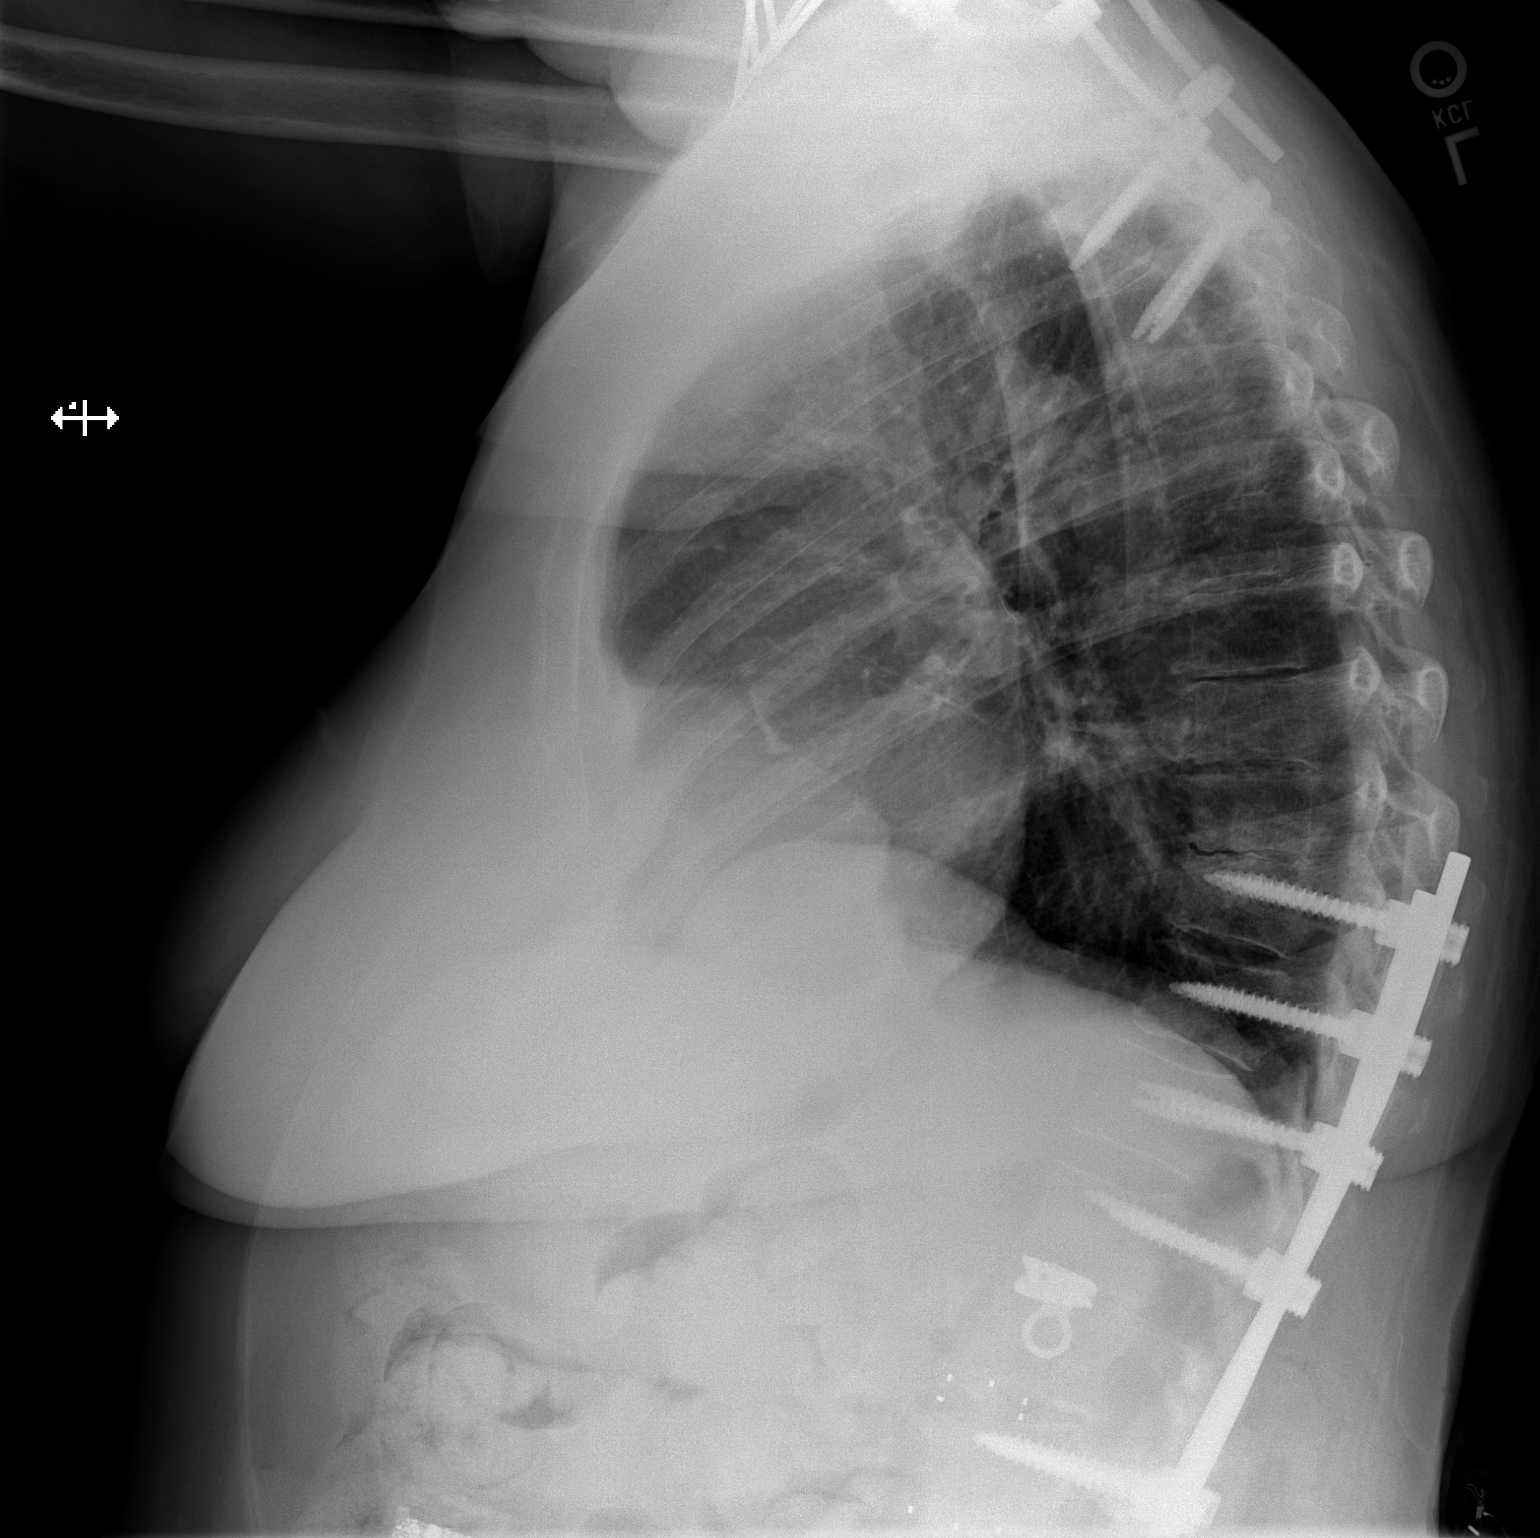

[w chest pa]
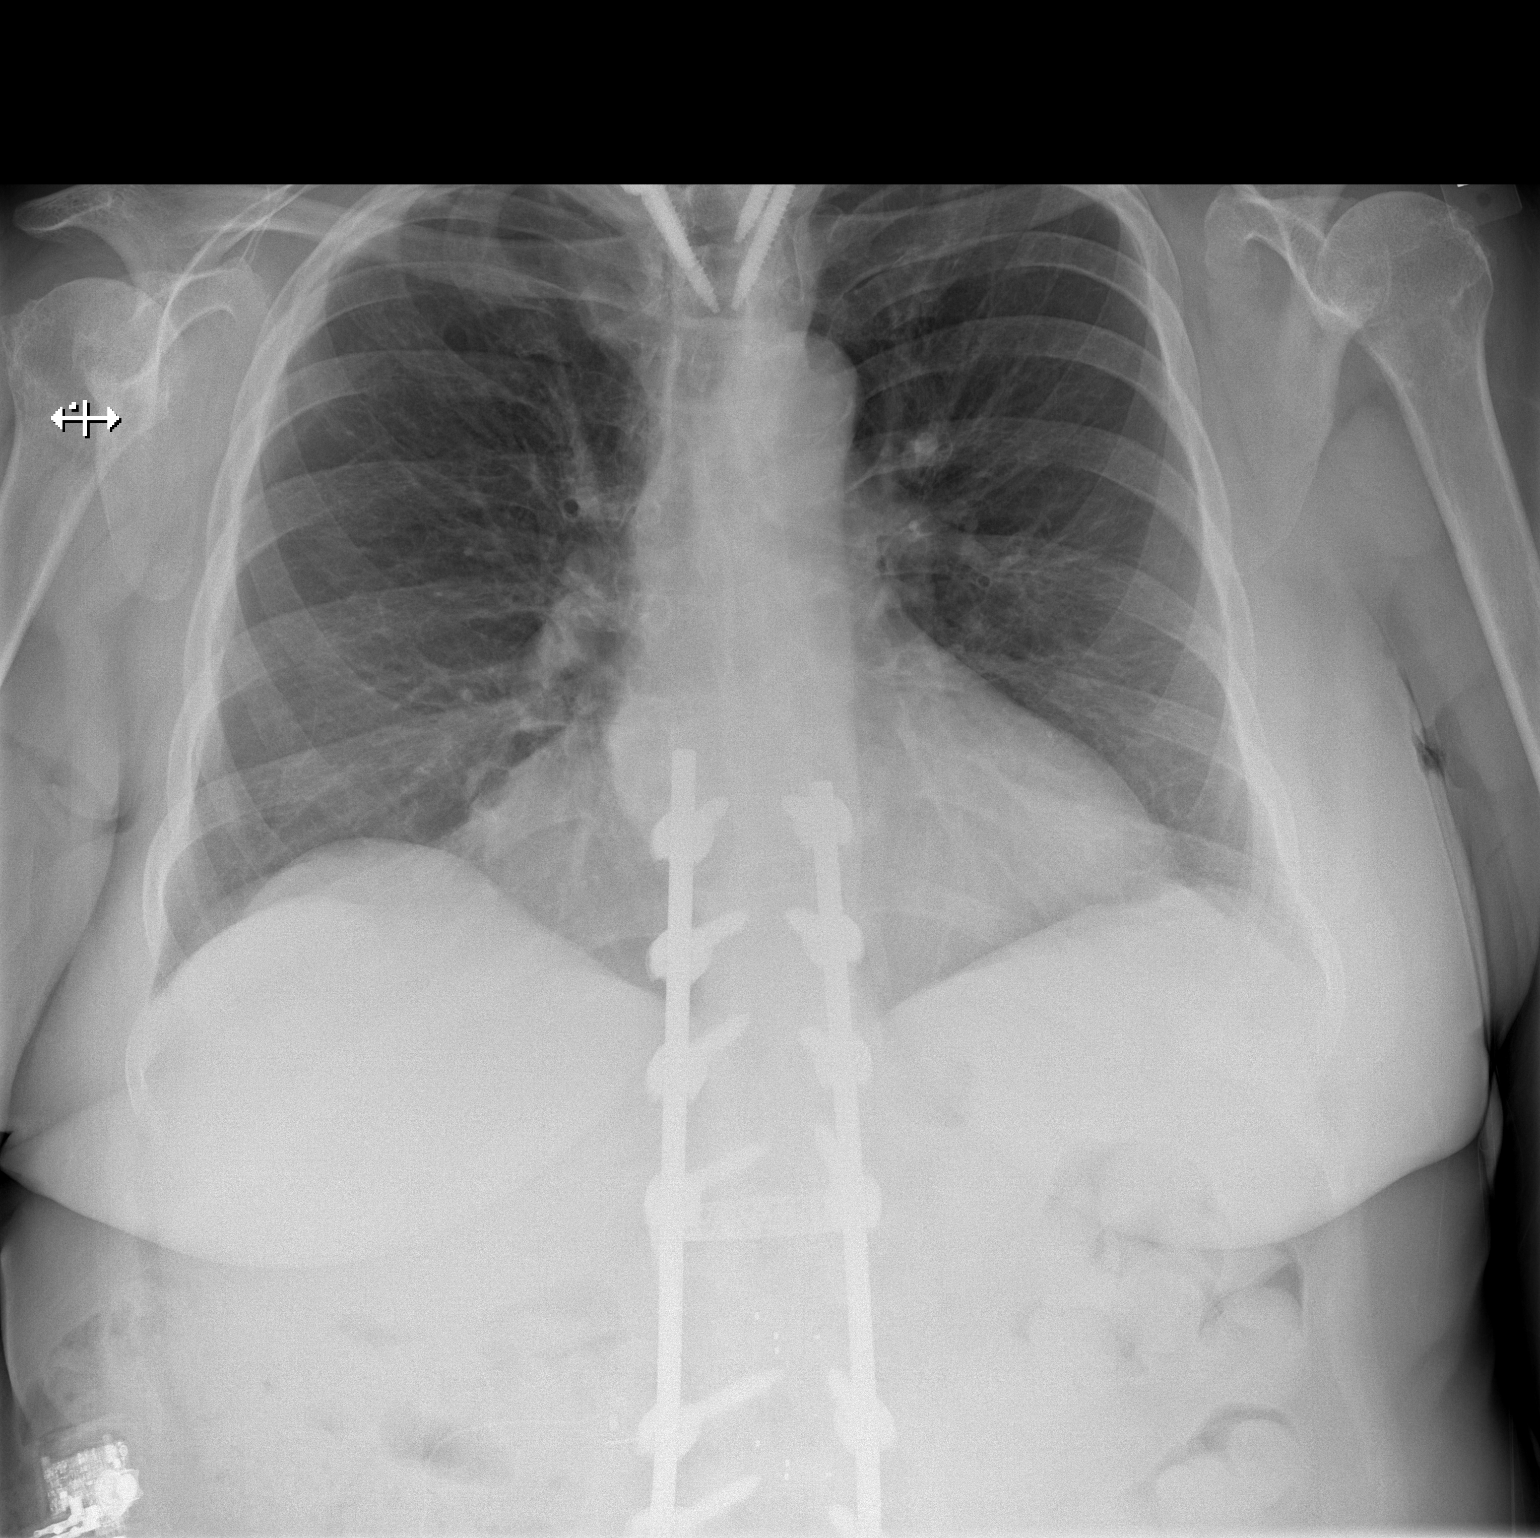

[2 of 2 positions shown; findings below may reference images not displayed]

FINDINGS: Unchanged cardiomediastinal silhouette. Decreased conspicuity of the
previously seen pneumothorax in the right peripheral lung, with lung
markings seen to the periphery. There is no focal airspace disease.
No large pleural effusion. Spinal fusion hardware noted. Thoracic
spondylosis. Severe disc height loss above the thoracolumbar fusion
hardware. No acute osseous abnormality.
IMPRESSION: Decreased conspicuity of the previously seen pneumothorax in the
right lower peripheral lung, with lung markings now seen to the
periphery. While a small occult pneumothorax is possible, the prior
finding have been in part artifactual.

These results were called by telephone at the time of interpretation
on [DATE] at [DATE] to provider JUMPER , who verbally
acknowledged these results.

## 2021-07-10 IMAGING — DX DG CHEST 2V
2 series · 2 of 2 positions shown · non-contrast
Comparison: Chest radiographs [DATE] and earlier.
COMPARISON: Chest radiographs [DATE] and earlier.

Addendum:
CLINICAL DATA: 70-year-old female with cough chest pain and
congestion. Treated with antibiotics and steroids on [REDACTED]
but no improvement. Former smoker.

EXAM:
CHEST - 2 VIEW

[chest pa]
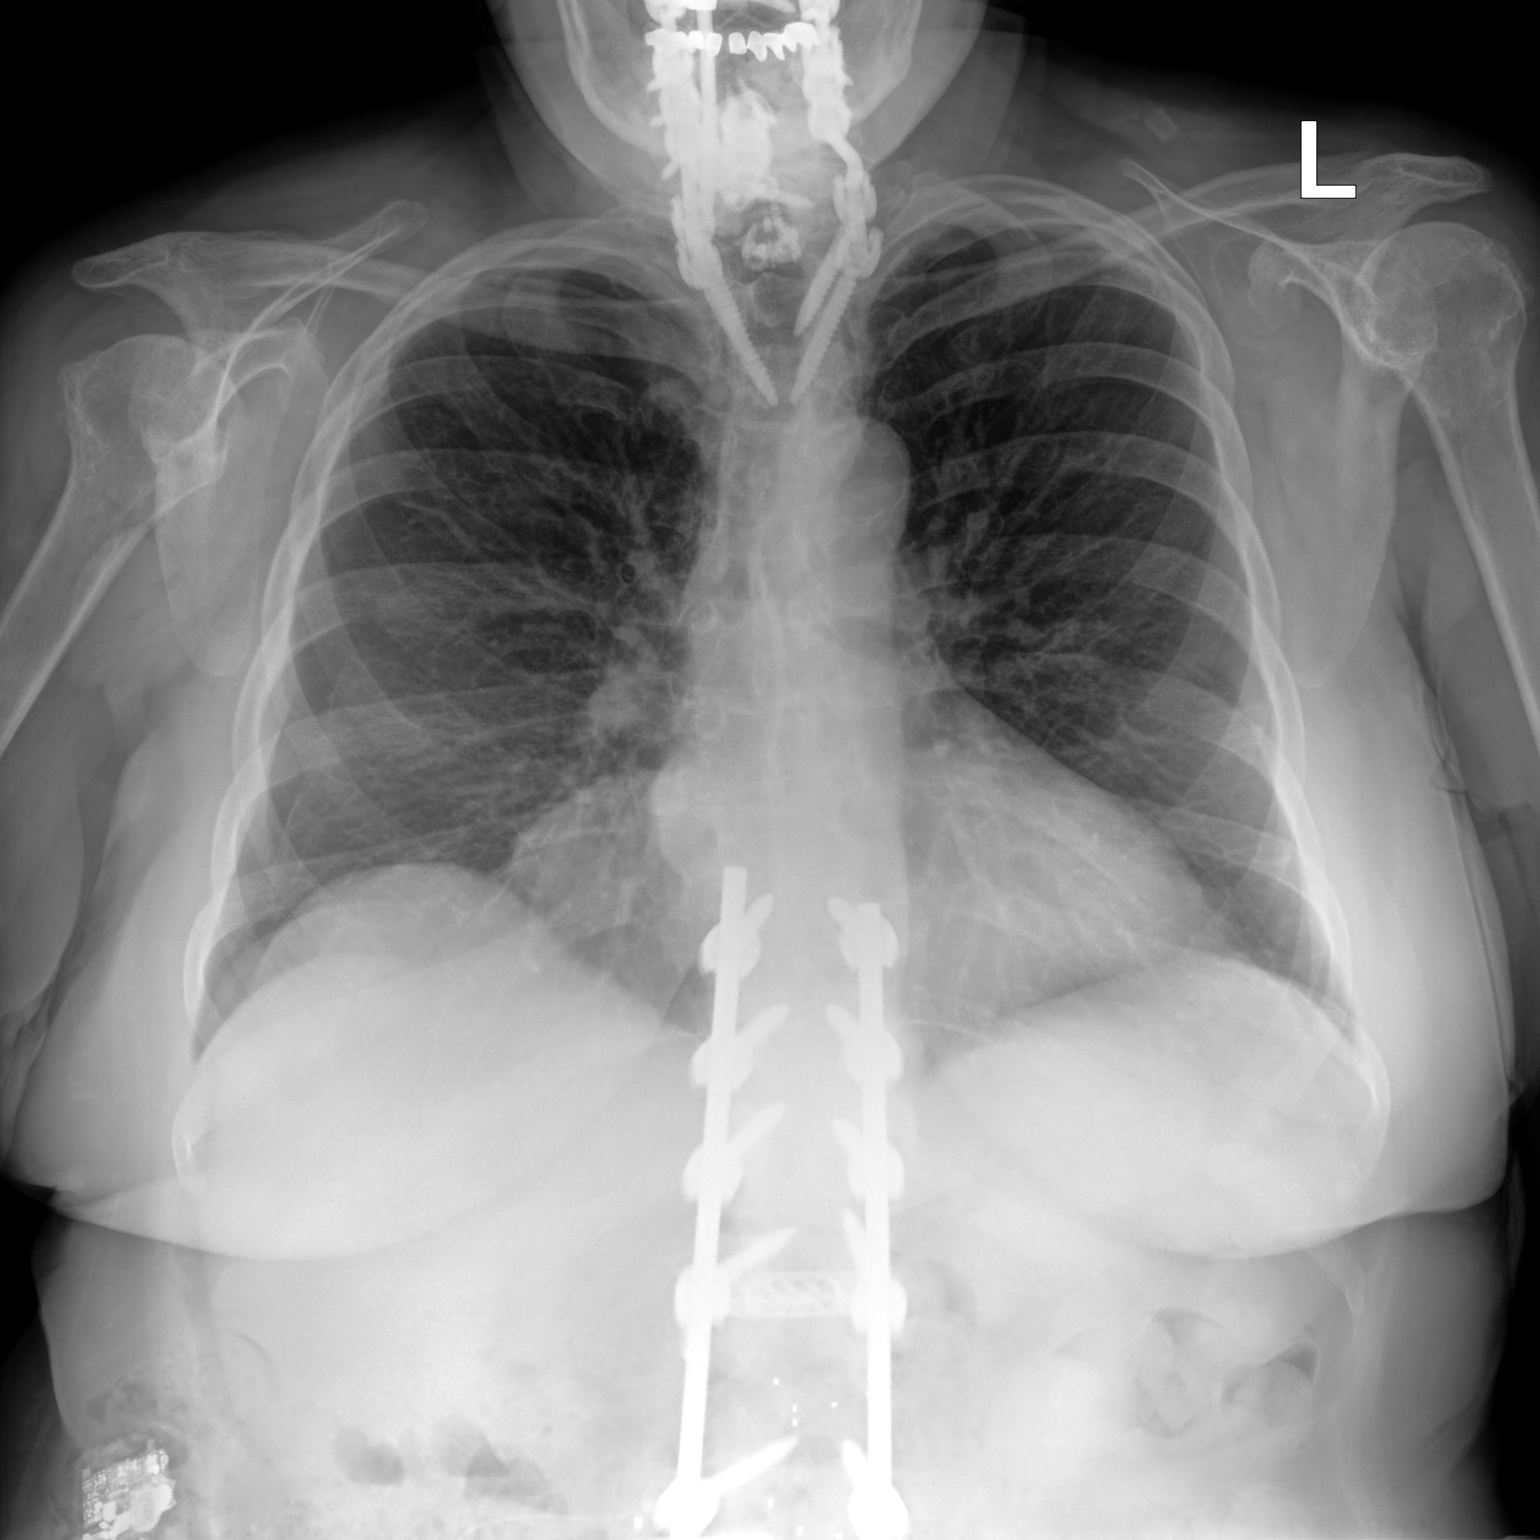

[chest lat]
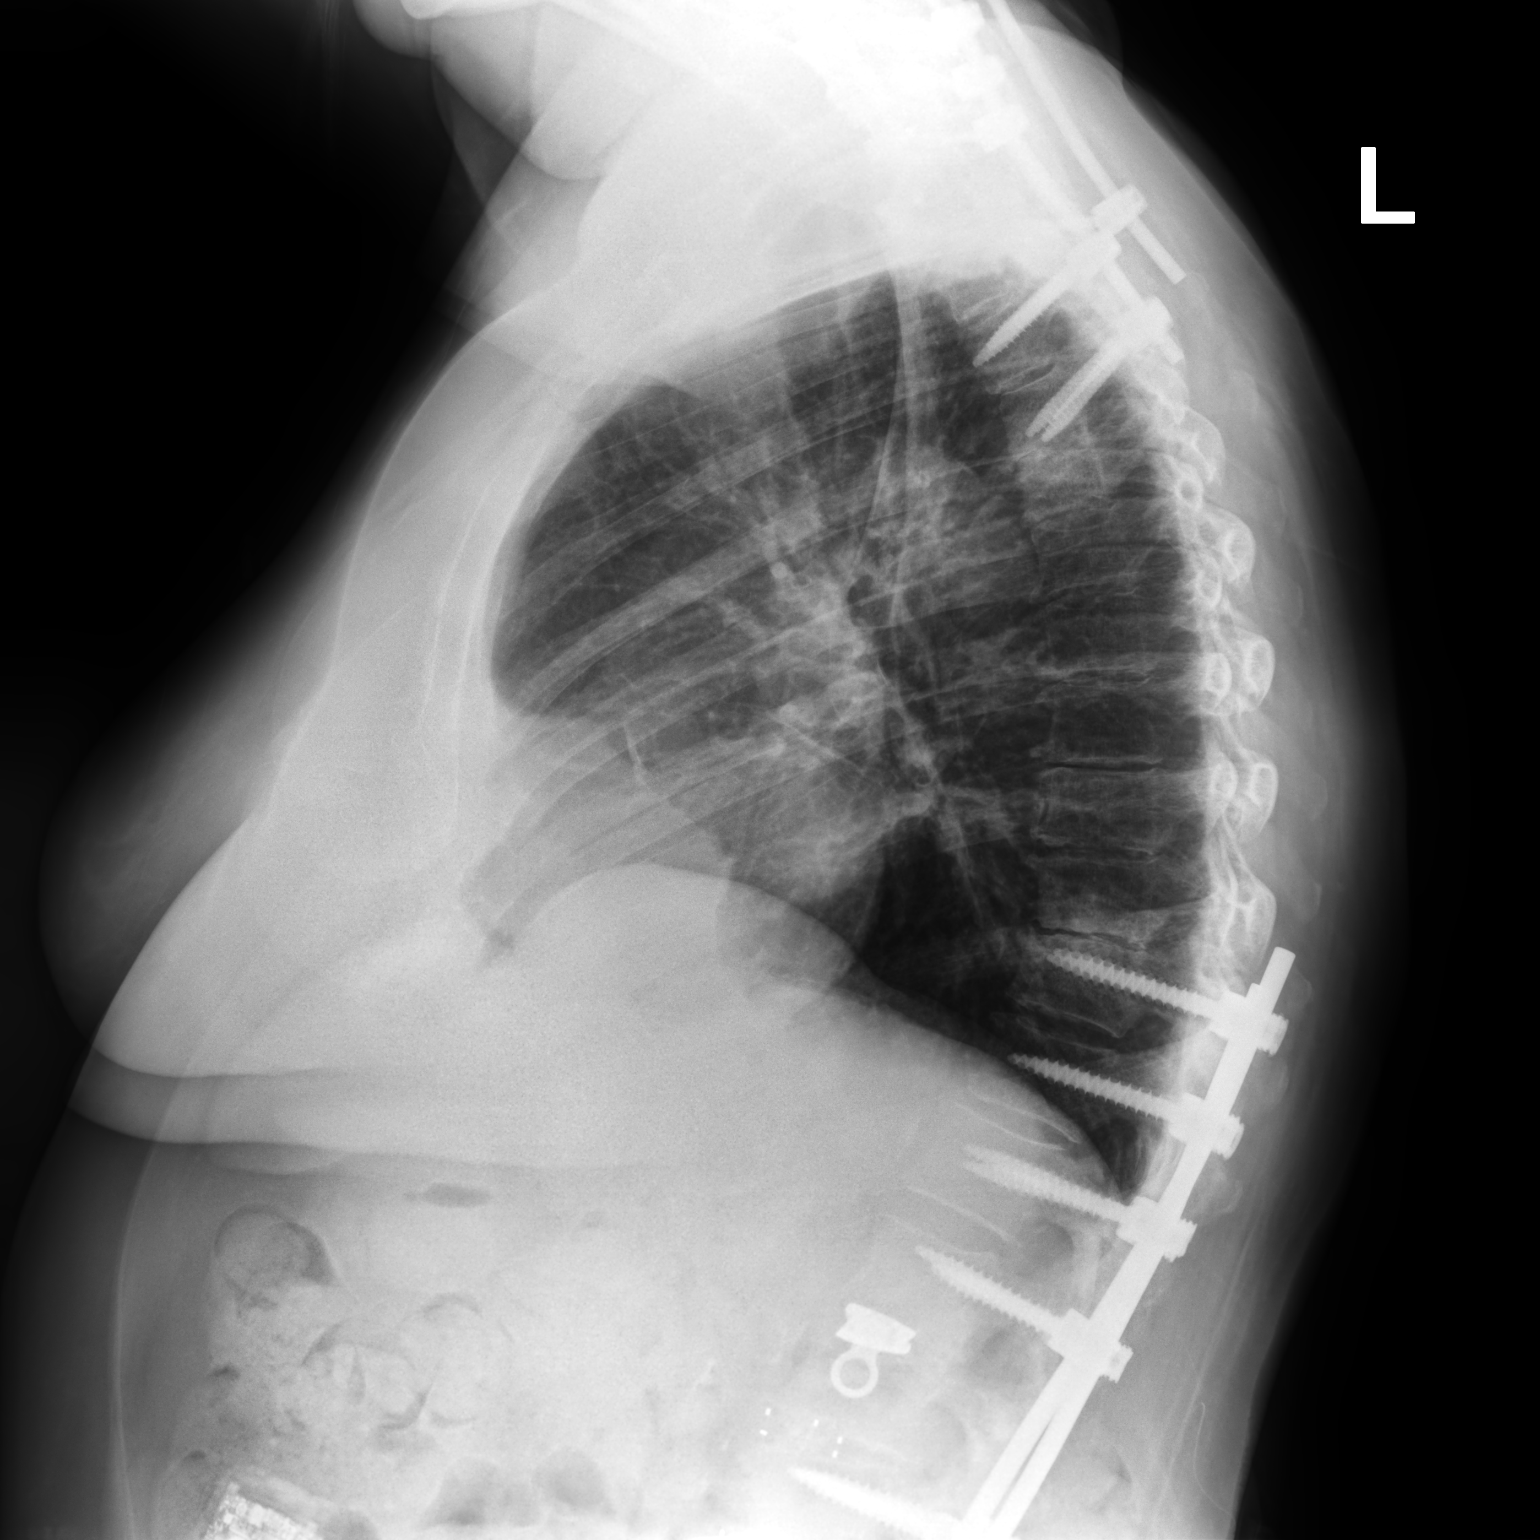

[2 of 2 positions shown; findings below may reference images not displayed]

FINDINGS: Extensive chronic spinal fusion.  Visible hardware appears stable.

Borderline cardiomegaly is stable. Other mediastinal contours are
within normal limits. Lung volumes are within normal limits.

There is a small right side pneumothorax, with pleural edge visible
at the level of the middle lobe (arrow). This is new from HS[facility],
not apparent on the lateral view. Elsewhere lung markings appear
stable, with unchanged interstitial prominence. No pleural effusion
or consolidation. No pulmonary edema suspected.

Negative visible bowel gas pattern.
IMPRESSION: Positive for a small right side pneumothorax.
But no other acute cardiopulmonary abnormality. Some chronic smoking
related pulmonary interstitial changes are suspected.

ADDENDUM:
Critical Value/emergent results were called by telephone at the time
of interpretation on [DATE] at [4N] hours to HSEN, RN in
the office of Dr. HSEN. She advised the patient had artery
been sent to an emergency department for further evaluation and
treatment.

*** End of Addendum ***
FINDINGS: Extensive chronic spinal fusion.  Visible hardware appears stable.

Borderline cardiomegaly is stable. Other mediastinal contours are
within normal limits. Lung volumes are within normal limits.

There is a small right side pneumothorax, with pleural edge visible
at the level of the middle lobe (arrow). This is new from HS[facility],
not apparent on the lateral view. Elsewhere lung markings appear
stable, with unchanged interstitial prominence. No pleural effusion
or consolidation. No pulmonary edema suspected.

Negative visible bowel gas pattern.
IMPRESSION: Positive for a small right side pneumothorax.
But no other acute cardiopulmonary abnormality. Some chronic smoking
related pulmonary interstitial changes are suspected.

## 2021-07-10 MED ORDER — DEXAMETHASONE SODIUM PHOSPHATE 10 MG/ML IJ SOLN
10.0000 mg | Freq: Once | INTRAMUSCULAR | Status: AC
  Administered 2021-07-10: 10 mg via INTRAMUSCULAR
  Filled 2021-07-10: qty 1

## 2021-07-10 NOTE — ED Provider Notes (Signed)
[place]   MRN: [id number] DOB: 12-18-1950  Subjective:   Lisa Good is a 70 y.o. female presenting for 10-day history of acute onset persistent productive cough, chest congestion, chest pain from coughing, malaise and fatigue.  Patient was seen 07/04/2021, had negative COVID, flu test.  She was prescribed Augmentin, steroid course for sinusitis.  Today reports ongoing symptoms.  No history of of respiratory disorders.  Patient is not a smoker.  No current facility-administered medications for this encounter.  Current Outpatient Medications:    albuterol (VENTOLIN HFA) 108 (90 Base) MCG/ACT inhaler, Inhale 1-2 puffs into the lungs every 6 (six) hours as needed for wheezing or shortness of breath., Disp: 1 each, Rfl: 0   Alpha-Lipoic Acid 600 MG CAPS, Take 1,200 mg by mouth daily. , Disp: , Rfl:    amLODipine (NORVASC) 10 MG tablet, Take 10 mg by mouth daily., Disp: , Rfl:    atorvastatin (LIPITOR) 40 MG tablet, Take 40 mg by mouth daily., Disp: , Rfl:    Cholecalciferol (VITAMIN D3) 25 MCG (1000 UT) CAPS, Take 1,000 Units by mouth daily. , Disp: , Rfl:    gabapentin (NEURONTIN) 800 MG tablet, Take 1 tablet (800 mg total) by mouth 4 (four) times daily., Disp: 120 tablet, Rfl: 3   guaiFENesin (MUCINEX) 600 MG 12 hr tablet, Take 1 tablet (600 mg total) by mouth 2 (two) times daily., Disp: 30 tablet, Rfl: 0   lisinopril (PRINIVIL,ZESTRIL) 40 MG tablet, Take 40 mg by mouth daily., Disp: , Rfl:    senna-docusate (SENOKOT-S) 8.6-50 MG tablet, Take 1 tablet by mouth 2 (two) times daily., Disp: 15 tablet, Rfl: 0   solifenacin (VESICARE) 5 MG tablet, Take 1 tablet (5 mg total) by mouth daily., Disp: 30 tablet, Rfl: 11   solifenacin (VESICARE) 5 MG tablet, Take 1 tablet (5 mg total) by mouth daily., Disp: 30 tablet, Rfl: 11   venlafaxine (EFFEXOR) 75 MG tablet, Take 75 mg by mouth 2 (two) times daily., Disp: , Rfl:    venlafaxine XR (EFFEXOR-XR) 150 MG 24 hr capsule, Take 150 mg by  mouth daily with breakfast. , Disp: , Rfl:    Vibegron (GEMTESA) 75 MG TABS, Take 75 mg by mouth daily., Disp: 90 tablet, Rfl: 3   vitamin B-12 (CYANOCOBALAMIN) 1000 MCG tablet, Take 1 tablet (1,000 mcg total) by mouth daily., Disp: 90 tablet, Rfl: 1   Allergies  Allergen Reactions   Contrast Media [Iodinated Diagnostic Agents] Hives   Cephalexin Nausea Only    Past Medical History:  Diagnosis Date   Allergy    seasonal   Arthritis    Depression    GERD (gastroesophageal reflux disease)    Hyperlipidemia    Hypertension    Iron deficiency anemia 11/01/2019   Neuromuscular disorder ([place])    peripheral neuropathy/feet   Sleep apnea    CPAP      Past Surgical History:  Procedure Laterality Date   BRAIN SURGERY  2002   aneurysm   COLONOSCOPY WITH PROPOFOL N/A 11/10/2019   Procedure: COLONOSCOPY WITH PROPOFOL;  Surgeon: Jonathon Bellows, MD;  Location: [place];  Service: Endoscopy;  Laterality: N/A;   ESOPHAGOGASTRODUODENOSCOPY (EGD) WITH PROPOFOL N/A 11/10/2019   Procedure: ESOPHAGOGASTRODUODENOSCOPY (EGD) WITH PROPOFOL;  Surgeon: Jonathon Bellows, MD;  Location: [place];  Service: Endoscopy;  Laterality: N/A;   EXTRACORPOREAL SHOCK WAVE LITHOTRIPSY Right 04/27/2019   Procedure: EXTRACORPOREAL SHOCK WAVE LITHOTRIPSY (ESWL);  Surgeon: Billey Co, MD;  Location: [facility];  Service:  Urology;  Laterality: Right;   JOINT REPLACEMENT Right    knee   KNEE ARTHROPLASTY Left 01/12/2020   Procedure: COMPUTER ASSISTED TOTAL KNEE ARTHROPLASTY;  Surgeon: Dereck Leep, MD;  Location: [facility];  Service: Orthopedics;  Laterality: Left;   POLYPECTOMY  11/10/2019   Procedure: POLYPECTOMY;  Surgeon: Jonathon Bellows, MD;  Location: [place];  Service: Endoscopy;;   SPINAL FUSION  07/10/2019   C4-5 corpectomy with C6-7 ACDF, C2-T3 spinal fusion   SPINE SURGERY     spinal fusion   TUBAL LIGATION      Family History  Problem Relation Age of Onset   Hypertension Mother     Hyperlipidemia Sister    Hypertension Sister    Depression Sister    Depression Brother     Social History   Tobacco Use   Smoking status: Former    Types: Cigarettes    Quit date: 10/29/2004    Years since quitting: 16.7   Smokeless tobacco: Never  Vaping Use   Vaping Use: Never used  Substance Use Topics   Alcohol use: Yes    Comment: rarely has a glass of wine   Drug use: Never    ROS   Objective:   Vitals: BP (!) 157/76 (BP Location: Left Arm)   Pulse (!) 109   Temp 98.3 F (36.8 C) (Oral)   Resp 16   SpO2 95%   Physical Exam Constitutional:      General: She is not in acute distress.    Appearance: Normal appearance. She is well-developed. She is ill-appearing. She is not toxic-appearing or diaphoretic.  HENT:     Head: Normocephalic and atraumatic.     Right Ear: External ear normal.     Left Ear: External ear normal.     Nose: Nose normal.     Mouth/Throat:     Mouth: Mucous membranes are moist.  Eyes:     Extraocular Movements: Extraocular movements intact.     Pupils: Pupils are equal, round, and reactive to light.  Cardiovascular:     Rate and Rhythm: Normal rate and regular rhythm.     Pulses: Normal pulses.     Heart sounds: Normal heart sounds. No murmur heard.   No friction rub. No gallop.  Pulmonary:     Effort: Pulmonary effort is normal. No respiratory distress.     Breath sounds: No stridor. No wheezing, rhonchi or rales.     Comments: Course lung sounds in upper fields bilaterally. Skin:    General: Skin is warm and dry.     Findings: No rash.  Neurological:     Mental Status: She is alert and oriented to person, place, and time.  Psychiatric:        Mood and Affect: Mood normal.        Behavior: Behavior normal.        Thought Content: Thought content normal.    DG Chest 2 View  Result Date: 07/10/2021 CLINICAL DATA:  70 year old female with cough chest pain and congestion. Treated with antibiotics and steroids on September  30th but no improvement. Former smoker. EXAM: CHEST - 2 VIEW COMPARISON:  Chest radiographs 05/21/2021 and earlier. FINDINGS: Extensive chronic spinal fusion.  Visible hardware appears stable. Borderline cardiomegaly is stable. Other mediastinal contours are within normal limits. Lung volumes are within normal limits. There is a small right side pneumothorax, with pleural edge visible at the level of the middle lobe (arrow). This is new from August, not  apparent on the lateral view. Elsewhere lung markings appear stable, with unchanged interstitial prominence. No pleural effusion or consolidation. No pulmonary edema suspected. Negative visible bowel gas pattern. IMPRESSION: Positive for a small right side pneumothorax. But no other acute cardiopulmonary abnormality. Some chronic smoking related pulmonary interstitial changes are suspected. Electronically Signed   By: Genevie Ann M.D.   On: 07/10/2021 09:10     Assessment and Plan :   PDMP not reviewed this encounter.  1. Spontaneous pneumothorax   2. Productive cough   3. Chest congestion   4. Atypical chest pain     Given her poor progression, small pneumothorax will redirect to the hospital for further management. Discussed transport by EMS but declined as her husband is here and she is hemodynamically stable enough to go by personal vehicle.    Jaynee Eagles, [place] 07/10/21 [phone]

## 2021-07-10 NOTE — ED Provider Notes (Signed)
Emergency Medicine Provider Triage Evaluation Note  Lisa Good , a 70 y.o. female  was evaluated in triage.  Pt complains of cough, congestion, chest pain.  Symptoms started 1 week ago with a cold, went to her doctor and was diagnosed with a sinus infection and bronchitis, started on antibiotics.  Cough was not improving so she returned to urgent care today who did a chest x-ray and found a small pneumothorax and sent patient to the emergency room.  Review of Systems  Positive: Cough, shortness of breath, chest pain Negative: Fever  Physical Exam  BP (!) 176/105 (BP Location: Right Arm)   Pulse (!) 102   Temp 98.5 F (36.9 C) (Oral)   Resp 18   SpO2 97%  Gen:   Awake, no distress   Resp:  Normal effort  MSK:   Moves extremities without difficulty  Other:  O2 sat 100% on room air  Medical Decision Making  Medically screening exam initiated at 10:25 AM.  Appropriate orders placed.  Lisa Good was informed that the remainder of the evaluation will be completed by another provider, this initial triage assessment does not replace that evaluation, and the importance of remaining in the ED until their evaluation is complete.     Tacy Learn, PA-C 07/10/21 1026    Hayden Rasmussen, MD 07/10/21 [phone]

## 2021-07-10 NOTE — ED Triage Notes (Signed)
Pt c/o cough and chest congestion x9 days. States tx'd with antibiotics and steroids on 9/30 with no relief.

## 2021-07-10 NOTE — Discharge Instructions (Addendum)
Return here at once if you become short of breath or have severe right-sided chest pain.  Get a repeat chest x-ray in 2 days to make sure that you are lung is not collapsed

## 2021-07-10 NOTE — ED Triage Notes (Signed)
Pt sent from UC for spontaneous pneumothorax. She reports coughing over the past week and feels short of breath.

## 2021-07-10 NOTE — ED Provider Notes (Addendum)
[facility] DEPT Provider Note   CSN: [id number] Arrival date & time: 07/10/21  2637     History No chief complaint on file.   Lisa Good is a 70 y.o. female.  70 year old female who presents with cough congestion x1 week.  States that she has chest tightness when she coughs.  Denies any dyspnea on exertion.  Has been treated for sinus infection recently and placed on antibiotics.  Went to urgent care center and had a chest x-ray which showed a possible right-sided small pneumothorax.  Sent here for further evaluation      Past Medical History:  Diagnosis Date   Allergy    seasonal   Arthritis    Depression    GERD (gastroesophageal reflux disease)    Hyperlipidemia    Hypertension    Iron deficiency anemia 11/01/2019   Neuromuscular disorder ([place])    peripheral neuropathy/feet   Sleep apnea    CPAP     Patient Active Problem List   Diagnosis Date Noted   Chronic right shoulder pain 11/11/2020   Right rotator cuff tear arthropathy 11/11/2020   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   Chronic radicular lumbar pain 12/04/2019   Iron deficiency anemia 11/01/2019   Primary osteoarthritis of left knee 10/17/2019   Primary osteoarthritis of right shoulder 10/17/2019   Hx of cervical spine surgery 08/21/2019   Pancolitis ([place]) 07/20/2019   Kyphosis of cervical region 07/14/2019   Cervical spondylosis with myelopathy 06/28/2019   Chronic low back pain 06/28/2019   Cervical spondylosis without myelopathy 11/09/2018   Foraminal stenosis of cervical region 11/09/2018   Cervical stenosis of spinal canal 11/09/2018   History of fusion of lumbar spine 11/09/2018   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   Postlaminectomy syndrome of lumbosacral region 11/09/2018   Chronic pain syndrome 11/09/2018   Severe obesity (BMI 35.0-39.9) with comorbidity ([place]) 08/18/2018   Primary osteoarthritis of right knee  02/17/2017   Hyperlipidemia 12/02/2016   Incontinence in female 10/05/2016   Spondylolisthesis, lumbar region 05/05/2016   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   OSA (obstructive sleep apnea) 05/31/2015   Hypothyroidism 02/09/2014   Sleep disorder 02/09/2014   Asymptomatic hyperuricemia 08/29/2013   Polyarthritis 08/29/2013   Essential hypertension 06/28/2013   SUI (stress urinary incontinence, female) 06/20/2013   Insomnia 05/22/2013   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   Peripheral neuropathy 05/25/2012   Venous (peripheral) insufficiency 05/25/2012   Depression 10/03/2009   Restless legs syndrome (RLS) 10/03/2009   Fibromyalgia 02/18/2009   Gastro-esophageal reflux disease with esophagitis 10/23/2008   Generalized anxiety disorder 10/23/2008   GERD (gastroesophageal reflux disease) 10/23/2008   Pure hypercholesterolemia 10/23/2008    Past Surgical History:  Procedure Laterality Date   BRAIN SURGERY  2002   aneurysm   COLONOSCOPY WITH PROPOFOL N/A 11/10/2019   Procedure: COLONOSCOPY WITH PROPOFOL;  Surgeon: Jonathon Bellows, MD;  Location: [facility];  Service: Endoscopy;  Laterality: N/A;   ESOPHAGOGASTRODUODENOSCOPY (EGD) WITH PROPOFOL N/A 11/10/2019   Procedure: ESOPHAGOGASTRODUODENOSCOPY (EGD) WITH PROPOFOL;  Surgeon: Jonathon Bellows, MD;  Location: [facility];  Service: Endoscopy;  Laterality: N/A;   EXTRACORPOREAL SHOCK WAVE LITHOTRIPSY Right 04/27/2019   Procedure: EXTRACORPOREAL SHOCK WAVE LITHOTRIPSY (ESWL);  Surgeon: Billey Co, MD;  Location: [facility];  Service: Urology;  Laterality: Right;   JOINT REPLACEMENT Right    knee   KNEE ARTHROPLASTY Left 01/12/2020   Procedure: COMPUTER ASSISTED TOTAL KNEE ARTHROPLASTY;  Surgeon: Dereck Leep, MD;  Location: [facility];  Service: Orthopedics;  Laterality: Left;   POLYPECTOMY  11/10/2019   Procedure: POLYPECTOMY;  Surgeon: Jonathon Bellows, MD;  Location: [facility];  Service: Endoscopy;;   SPINAL FUSION  07/10/2019    C4-5 corpectomy with C6-7 ACDF, C2-T3 spinal fusion   SPINE SURGERY     spinal fusion   TUBAL LIGATION       OB History   No obstetric history on file.     Family History  Problem Relation Age of Onset   Hypertension Mother    Hyperlipidemia Sister    Hypertension Sister    Depression Sister    Depression Brother     Social History   Tobacco Use   Smoking status: Former    Types: Cigarettes    Quit date: 10/29/2004    Years since quitting: 16.7   Smokeless tobacco: Never  Vaping Use   Vaping Use: Never used  Substance Use Topics   Alcohol use: Yes    Comment: rarely has a glass of wine   Drug use: Never    Home Medications Prior to Admission medications   Medication Sig Start Date End Date Taking? Authorizing Provider  albuterol (VENTOLIN HFA) 108 (90 Base) MCG/ACT inhaler Inhale 1-2 puffs into the lungs every 6 (six) hours as needed for wheezing or shortness of breath. 05/21/21   Odis Luster, FNP  Alpha-Lipoic Acid 600 MG CAPS Take 1,200 mg by mouth daily.     [provider]  amLODipine (NORVASC) 10 MG tablet Take 10 mg by mouth daily.    [provider]  atorvastatin (LIPITOR) 40 MG tablet Take 40 mg by mouth daily.    [provider]  Cholecalciferol (VITAMIN D3) 25 MCG (1000 UT) CAPS Take 1,000 Units by mouth daily.     [provider]  gabapentin (NEURONTIN) 800 MG tablet Take 1 tablet (800 mg total) by mouth 4 (four) times daily. 01/09/21   Gillis Santa, MD  guaiFENesin (MUCINEX) 600 MG 12 hr tablet Take 1 tablet (600 mg total) by mouth 2 (two) times daily. 05/21/21   Odis Luster, FNP  lisinopril (PRINIVIL,ZESTRIL) 40 MG tablet Take 40 mg by mouth daily.    [provider]  senna-docusate (SENOKOT-S) 8.6-50 MG tablet Take 1 tablet by mouth 2 (two) times daily. 01/14/20   Duanne Guess, PA-C  solifenacin (VESICARE) 5 MG tablet Take 1 tablet (5 mg total) by mouth daily. 04/15/21   Bjorn Loser, MD   solifenacin (VESICARE) 5 MG tablet Take 1 tablet (5 mg total) by mouth daily. 04/15/21   Bjorn Loser, MD  venlafaxine (EFFEXOR) 75 MG tablet Take 75 mg by mouth 2 (two) times daily.    [provider]  venlafaxine XR (EFFEXOR-XR) 150 MG 24 hr capsule Take 150 mg by mouth daily with breakfast.  10/26/19   [provider]  Vibegron (GEMTESA) 75 MG TABS Take 75 mg by mouth daily. 05/05/21   Bjorn Loser, MD  vitamin B-12 (CYANOCOBALAMIN) 1000 MCG tablet Take 1 tablet (1,000 mcg total) by mouth daily. 11/01/19   Earlie Server, MD    Allergies    Contrast media [iodinated diagnostic agents] and Cephalexin  Review of Systems   Review of Systems  All other systems reviewed and are negative.  Physical Exam Updated Vital Signs BP (!) 153/83   Pulse 91   Temp 98.5 F (36.9 C) (Oral)   Resp 16   SpO2 96%   Physical Exam Vitals and  nursing note reviewed.  Constitutional:      General: She is not in acute distress.    Appearance: Normal appearance. She is well-developed. She is not toxic-appearing.  HENT:     Head: Normocephalic and atraumatic.  Eyes:     General: Lids are normal.     Conjunctiva/sclera: Conjunctivae normal.     Pupils: Pupils are equal, round, and reactive to light.  Neck:     Thyroid: No thyroid mass.     Trachea: No tracheal deviation.  Cardiovascular:     Rate and Rhythm: Normal rate and regular rhythm.     Heart sounds: Normal heart sounds. No murmur heard.   No gallop.  Pulmonary:     Effort: Pulmonary effort is normal. No respiratory distress.     Breath sounds: Normal breath sounds. No stridor. No decreased breath sounds, wheezing, rhonchi or rales.  Abdominal:     General: There is no distension.     Palpations: Abdomen is soft.     Tenderness: There is no abdominal tenderness. There is no rebound.  Musculoskeletal:        General: No tenderness. Normal range of motion.     Cervical back: Normal range of motion and neck supple.   Skin:    General: Skin is warm and dry.     Findings: No abrasion or rash.  Neurological:     Mental Status: She is alert and oriented to person, place, and time. Mental status is at baseline.     GCS: GCS eye subscore is 4. GCS verbal subscore is 5. GCS motor subscore is 6.     Cranial Nerves: Cranial nerves are intact. No cranial nerve deficit.     Sensory: No sensory deficit.     Motor: Motor function is intact.  Psychiatric:        Attention and Perception: Attention normal.        Speech: Speech normal.        Behavior: Behavior normal.    ED Results / Procedures / Treatments   Labs (all labs ordered are listed, but only abnormal results are displayed) Labs Reviewed  RESP PANEL BY RT-PCR (FLU A&B, COVID) ARPGX2  BASIC METABOLIC PANEL  CBC  TROPONIN I (HIGH SENSITIVITY)    EKG None  Radiology DG Chest 2 View  Result Date: 07/10/2021 CLINICAL DATA:  Shortness of breath EXAM: CHEST - 2 VIEW COMPARISON:  Radiograph 07/10/2021 FINDINGS: Unchanged cardiomediastinal silhouette. Decreased conspicuity of the previously seen pneumothorax in the right peripheral lung, with lung markings seen to the periphery. There is no focal airspace disease. No large pleural effusion. Spinal fusion hardware noted. Thoracic spondylosis. Severe disc height loss above the thoracolumbar fusion hardware. No acute osseous abnormality. IMPRESSION: Decreased conspicuity of the previously seen pneumothorax in the right lower peripheral lung, with lung markings now seen to the periphery. While a small occult pneumothorax is possible, the prior finding have been in part artifactual. These results were called by telephone at the time of interpretation on 07/10/2021 at 11:08 am to provider Lacretia Leigh , who verbally acknowledged these results. Electronically Signed   By: Maurine Simmering M.D.   On: 07/10/2021 11:12   DG Chest 2 View  Addendum Date: 07/10/2021   ADDENDUM REPORT: 07/10/2021 09:38 ADDENDUM: Critical  Value/emergent results were called by telephone at the time of interpretation on 07/10/2021 at 0930 hours to Lyndel Safe, RN in the office of Dr. Jaynee Eagles. She advised the patient had artery been sent  to an emergency department for further evaluation and treatment. Electronically Signed   By: Genevie Ann M.D.   On: 07/10/2021 09:38   Result Date: 07/10/2021 CLINICAL DATA:  70 year old female with cough chest pain and congestion. Treated with antibiotics and steroids on September 30th but no improvement. Former smoker. EXAM: CHEST - 2 VIEW COMPARISON:  Chest radiographs 05/21/2021 and earlier. FINDINGS: Extensive chronic spinal fusion.  Visible hardware appears stable. Borderline cardiomegaly is stable. Other mediastinal contours are within normal limits. Lung volumes are within normal limits. There is a small right side pneumothorax, with pleural edge visible at the level of the middle lobe (arrow). This is new from August, not apparent on the lateral view. Elsewhere lung markings appear stable, with unchanged interstitial prominence. No pleural effusion or consolidation. No pulmonary edema suspected. Negative visible bowel gas pattern. IMPRESSION: Positive for a small right side pneumothorax. But no other acute cardiopulmonary abnormality. Some chronic smoking related pulmonary interstitial changes are suspected. Electronically Signed: By: Genevie Ann M.D. On: 07/10/2021 09:10    Procedures Procedures   Medications Ordered in ED Medications - No data to display  ED Course  I have reviewed the triage vital signs and the nursing notes.  Pertinent labs & imaging results that were available during my care of the patient were reviewed by me and considered in my medical decision making (see chart for details).    MDM Rules/Calculators/A&P                           Chest x-ray results noted.  Long discussion with patient about possibly getting a chest CT which she has deferred.  She has no shortness of  breath.  She will need to have a repeat chest x-ray and 2 days.  We will check COVID test Final Clinical Impression(s) / ED Diagnoses Final diagnoses:  None    Rx / DC Orders ED Discharge Orders     None        Lacretia Leigh, MD 07/10/21 1137    Lacretia Leigh, MD 07/10/21 1215

## 2021-07-10 NOTE — Discharge Instructions (Addendum)
Please report to the emergency room now as you have a spontaneous pneumothorax. Have your husband take you there now.

## 2021-07-13 ENCOUNTER — Other Ambulatory Visit: Payer: Self-pay

## 2021-07-13 ENCOUNTER — Ambulatory Visit
Admission: EM | Admit: 2021-07-13 | Discharge: 2021-07-13 | Disposition: A | Discharge status: Home / Self Care | Payer: Medicare Other | Attending: Internal Medicine | Admitting: Internal Medicine

## 2021-07-13 ENCOUNTER — Emergency Department (HOSPITAL_BASED_OUTPATIENT_CLINIC_OR_DEPARTMENT_OTHER): Payer: Medicare Other

## 2021-07-13 ENCOUNTER — Emergency Department (HOSPITAL_BASED_OUTPATIENT_CLINIC_OR_DEPARTMENT_OTHER)
Admission: EM | Admit: 2021-07-13 | Discharge: 2021-07-13 | Disposition: A | Discharge status: Home / Self Care | Payer: Medicare Other | Attending: Student | Admitting: Student

## 2021-07-13 ENCOUNTER — Encounter (HOSPITAL_BASED_OUTPATIENT_CLINIC_OR_DEPARTMENT_OTHER): Payer: Self-pay | Admitting: *Deleted

## 2021-07-13 ENCOUNTER — Ambulatory Visit (INDEPENDENT_AMBULATORY_CARE_PROVIDER_SITE_OTHER): Payer: Medicare Other

## 2021-07-13 DIAGNOSIS — R051 Acute cough: Secondary | ICD-10-CM | POA: Diagnosis not present

## 2021-07-13 DIAGNOSIS — K120 Recurrent oral aphthae: Secondary | ICD-10-CM

## 2021-07-13 DIAGNOSIS — R0789 Other chest pain: Secondary | ICD-10-CM | POA: Diagnosis not present

## 2021-07-13 DIAGNOSIS — R059 Cough, unspecified: Secondary | ICD-10-CM

## 2021-07-13 DIAGNOSIS — Z96653 Presence of artificial knee joint, bilateral: Secondary | ICD-10-CM | POA: Diagnosis not present

## 2021-07-13 DIAGNOSIS — R079 Chest pain, unspecified: Secondary | ICD-10-CM

## 2021-07-13 DIAGNOSIS — Z79899 Other long term (current) drug therapy: Secondary | ICD-10-CM | POA: Diagnosis not present

## 2021-07-13 DIAGNOSIS — R5381 Other malaise: Secondary | ICD-10-CM

## 2021-07-13 DIAGNOSIS — E039 Hypothyroidism, unspecified: Secondary | ICD-10-CM | POA: Diagnosis not present

## 2021-07-13 DIAGNOSIS — R9389 Abnormal findings on diagnostic imaging of other specified body structures: Secondary | ICD-10-CM | POA: Diagnosis not present

## 2021-07-13 DIAGNOSIS — Z87891 Personal history of nicotine dependence: Secondary | ICD-10-CM | POA: Insufficient documentation

## 2021-07-13 DIAGNOSIS — I10 Essential (primary) hypertension: Secondary | ICD-10-CM | POA: Insufficient documentation

## 2021-07-13 IMAGING — CT CT CHEST W/O CM
2 of 4 series · 14 of 36 positions shown, 17 images · non-contrast
Comparison: None.

CLINICAL DATA: Persistent cough.

EXAM:
CT CHEST WITHOUT CONTRAST
TECHNIQUE: Multidetector CT imaging of the chest was performed following the
standard protocol without IV contrast.

[Series 2: routine chest without · axial · non-contrast · 0.75mm/px · z∈[-280,-54]mm · 11 of 133 slices shown, 14 images]
[im 10/133  mediastinal]
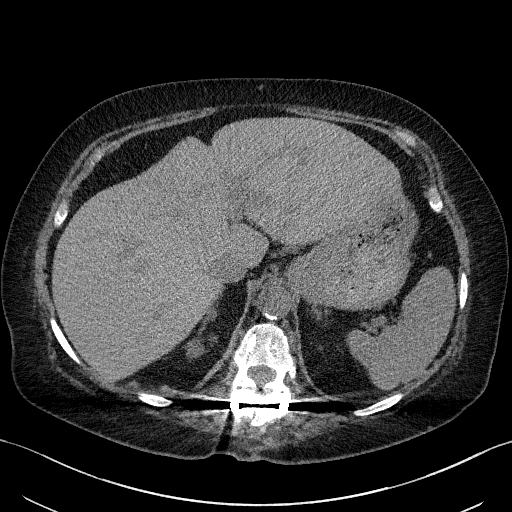
[im 10/133  lung]
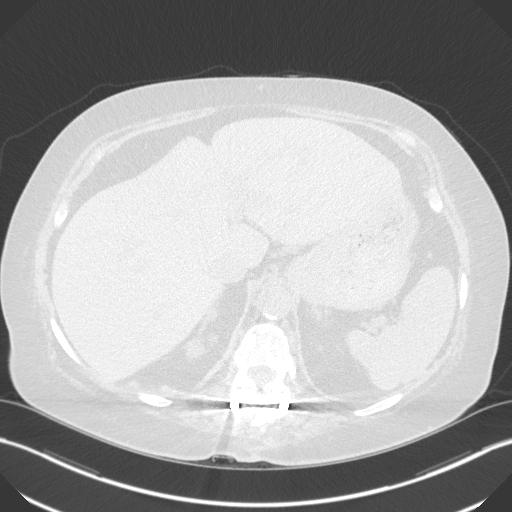
[im 19/133  lung]
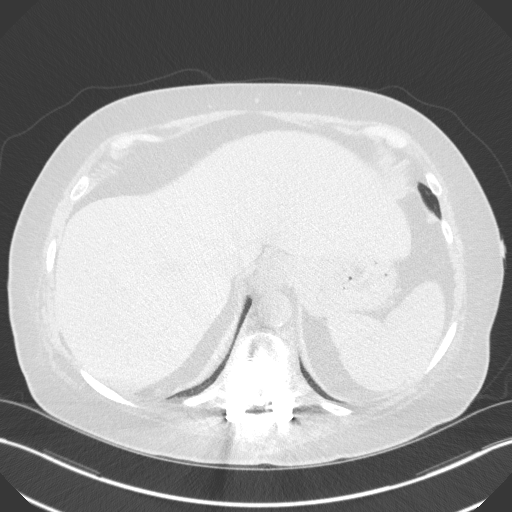
[im 29/133  lung]
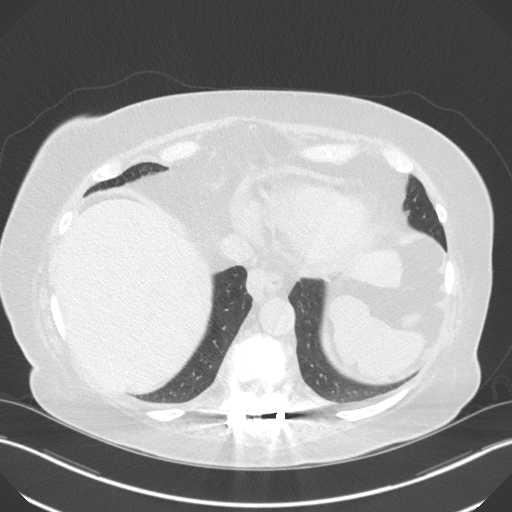
[im 48/133  lung]
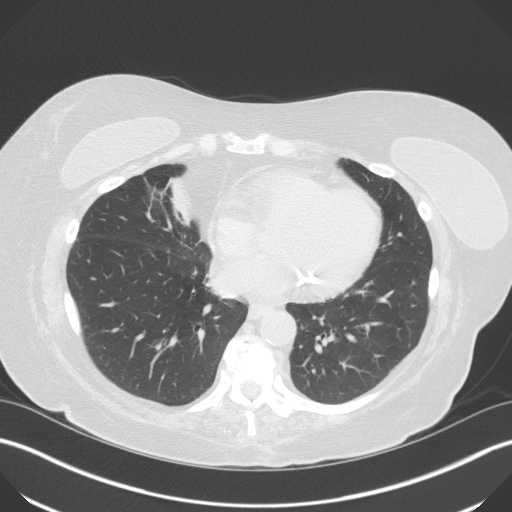
[im 57/133  mediastinal]
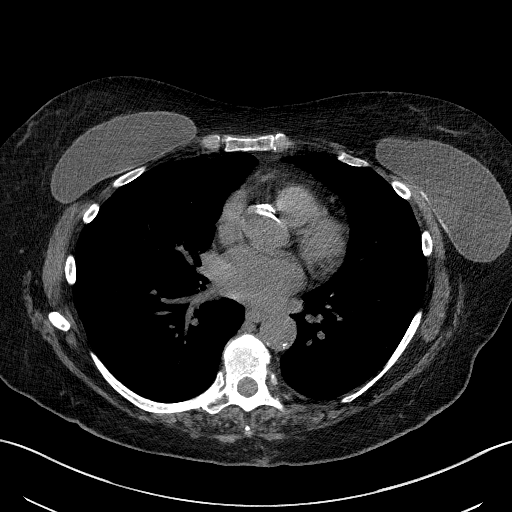
[im 57/133  lung]
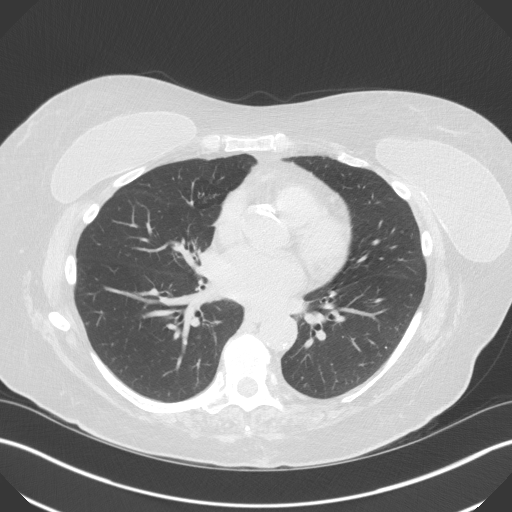
[im 67/133  lung]
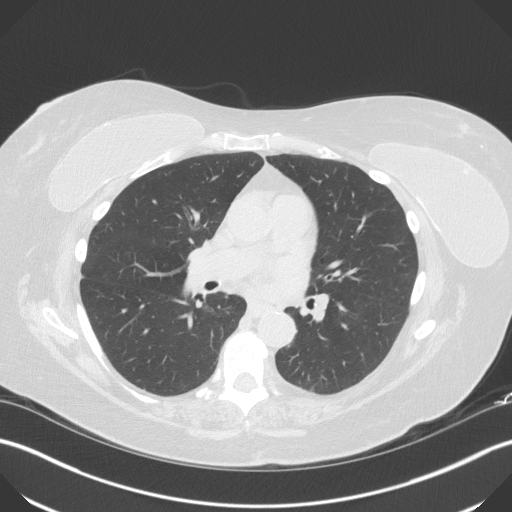
[im 76/133  lung]
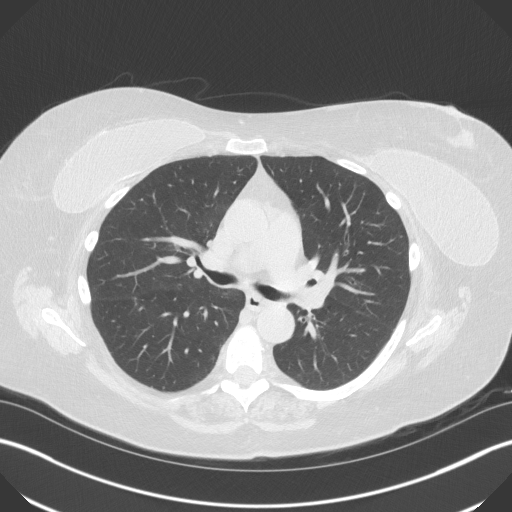
[im 85/133  lung]
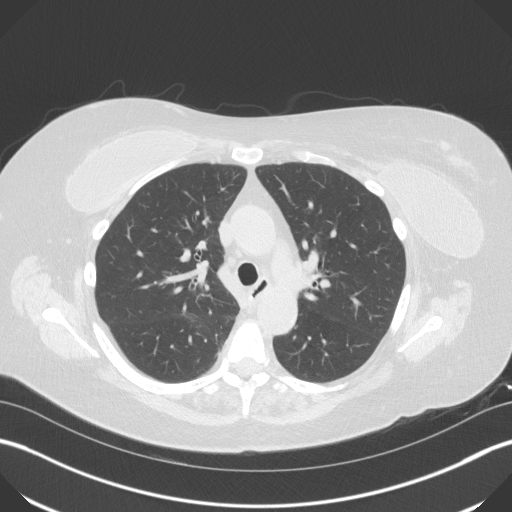
[im 104/133  mediastinal]
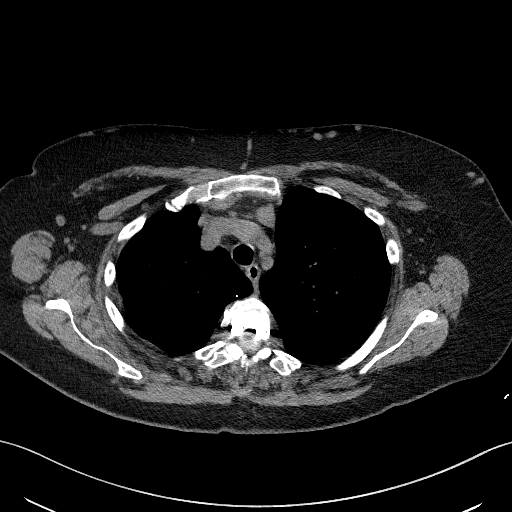
[im 104/133  lung]
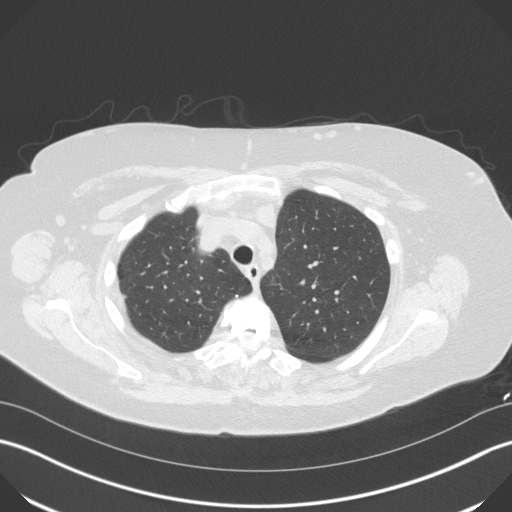
[im 114/133  lung]
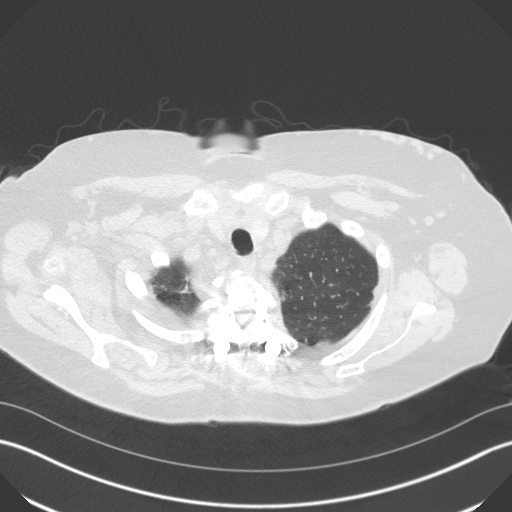
[im 123/133  lung]
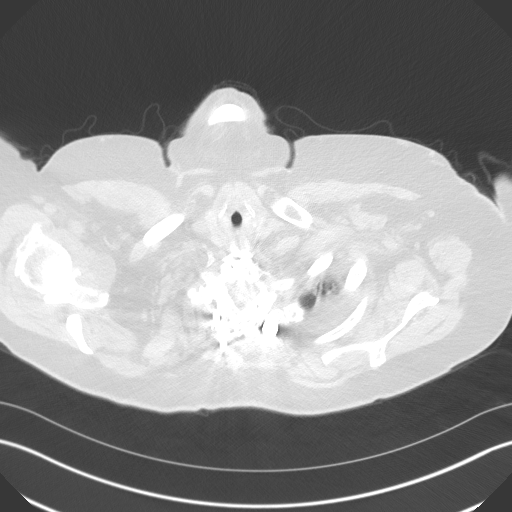

[Series 5: coronal · coronal · 0.58mm/px · 3 of 153 slices shown]
[im 31/153  lung]
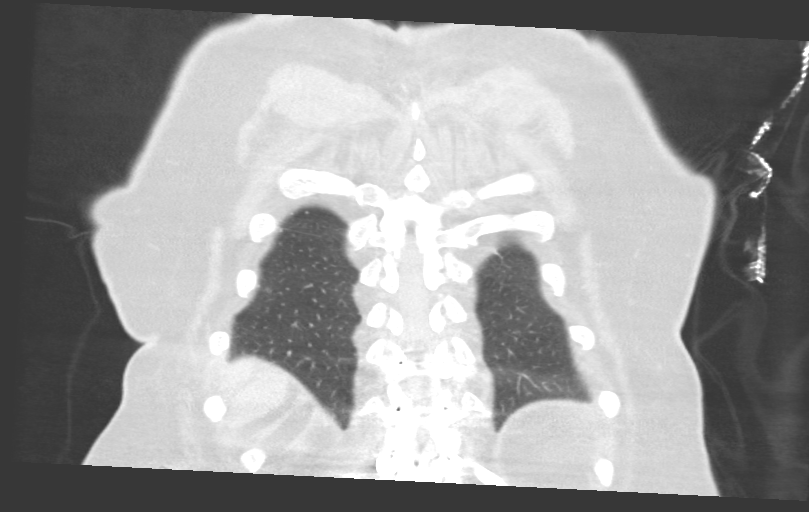
[im 61/153  lung]
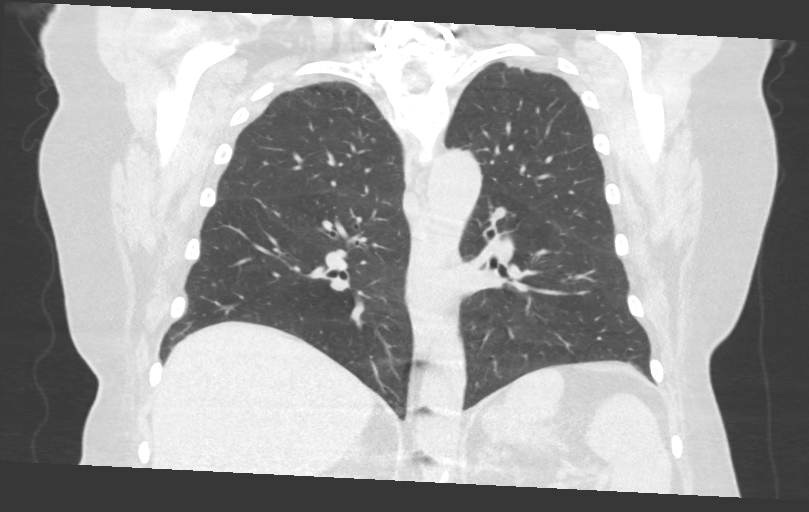
[im 92/153  lung]
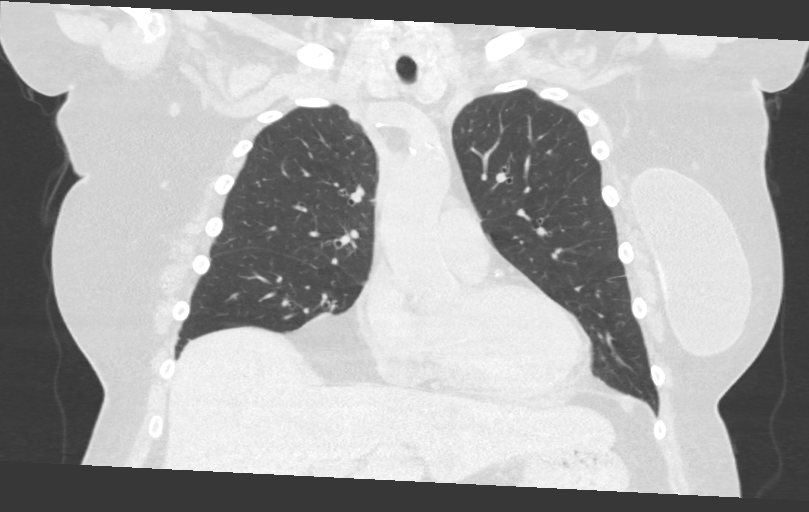

[14 of 36 positions shown; findings below may reference images not displayed]

FINDINGS: Cardiovascular: No significant vascular findings. Normal heart size.
No pericardial effusion. Thoracic aortic atherosclerosis. Coronary
artery atherosclerosis.

Mediastinum/Nodes: No enlarged mediastinal or axillary lymph nodes.
Thyroid gland, trachea, and esophagus demonstrate no significant
findings.

Lungs/Pleura: Lungs are clear. No pleural effusion or pneumothorax.
Mild right middle lobe bronchiectasis. 3 mm left lower lobe
pulmonary nodule.

Upper Abdomen: No acute abnormality.  Small hiatal hernia.

Musculoskeletal: No acute osseous abnormality. Posterior spinal
fusion of the cervicothoracic spine partially visualized. Partially
visualized posterior lumbar fusion of the thoracolumbar spine.

Severe degenerative disease with disc height loss at T3-4 with
developing ankylosis and bilateral facet arthropathy. Severe
degenerative disease with disc height loss at T9-10 with endplate
sclerosis, bilateral facet arthropathy, and bilateral foraminal
narrowing.

Severe osteoarthritis of bilateral glenohumeral joints.

Other: Bilateral subpectoral breast implants.
IMPRESSION: 1. No acute cardiopulmonary disease.
2.  Aortic Atherosclerosis ([MV]-[MV]).
3. 3 mm left solid pulmonary nodule. No routine follow-up imaging is
recommended per [HOSPITAL] Guidelines. These guidelines do
not apply to immunocompromised patients and patients with cancer.
Follow up in patients with significant comorbidities as clinically
warranted. For lung cancer screening, adhere to Lung-RADS
guidelines. Reference: Radiology. [MV]; 284(1):228-43.

## 2021-07-13 IMAGING — DX DG CHEST 2V
3 series · 3 of 3 positions shown · non-contrast
Comparison: [DATE]

Correlation: CT abdomen and pelvis [DATE]

CLINICAL DATA: Cough, chest heaviness, sore tongue, malaise

EXAM:
CHEST - 2 VIEW

[chest pa (1 of 2)]
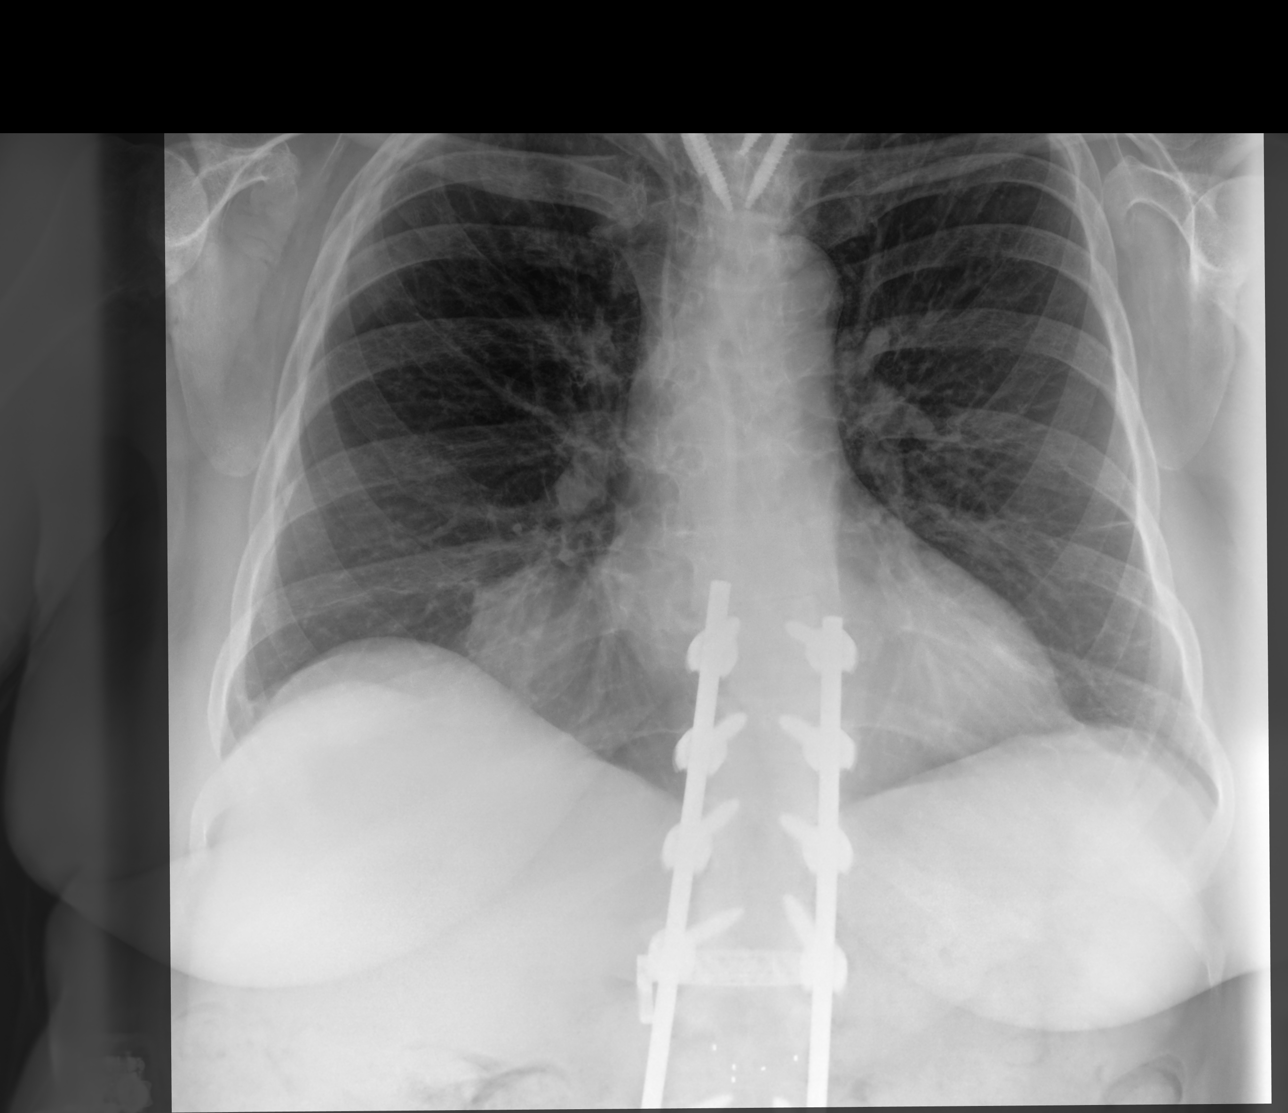

[chest pa (2 of 2)]
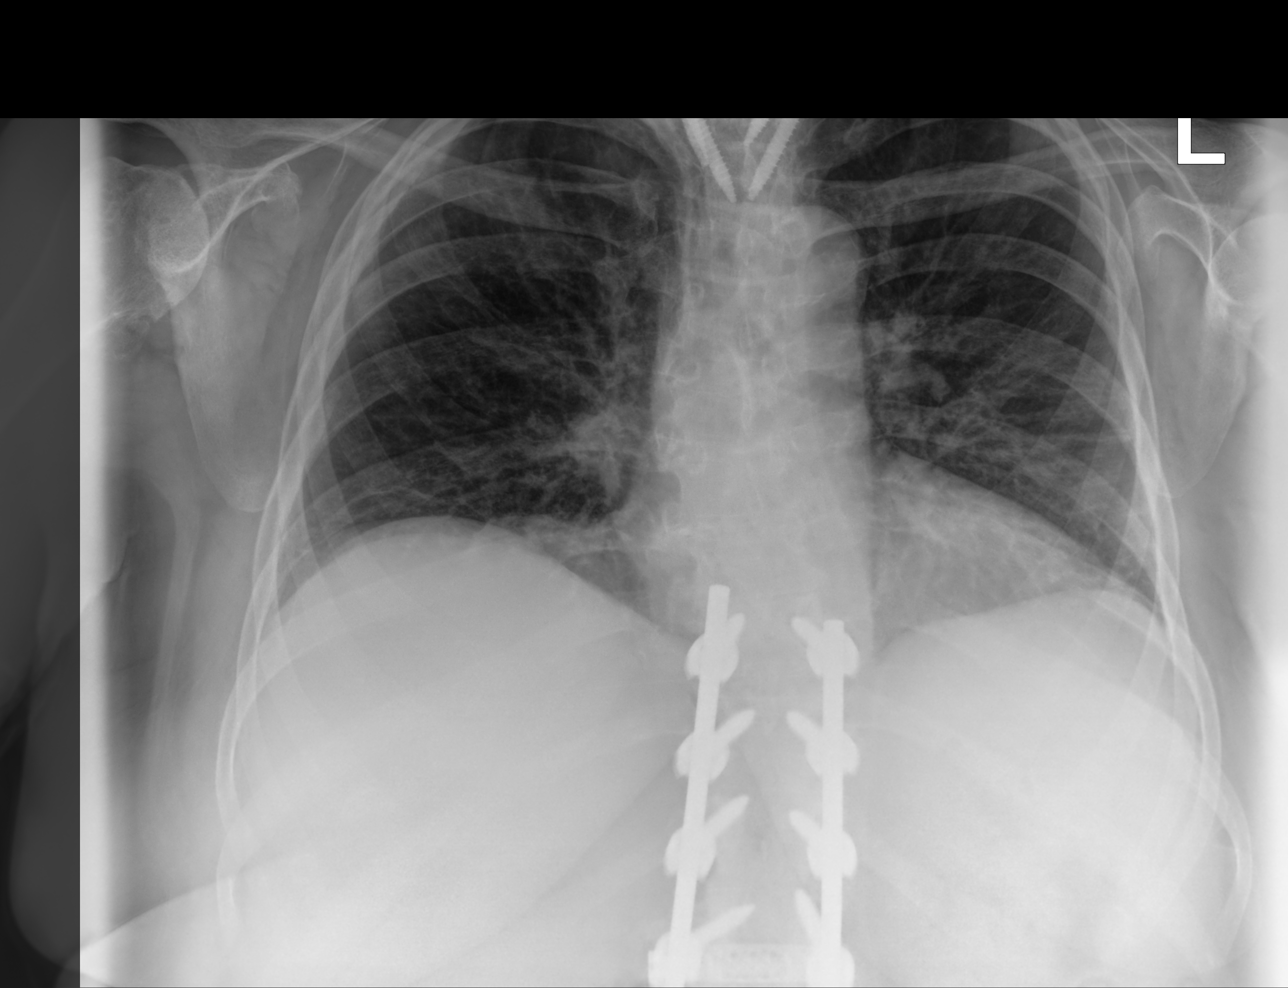

[chest lat]
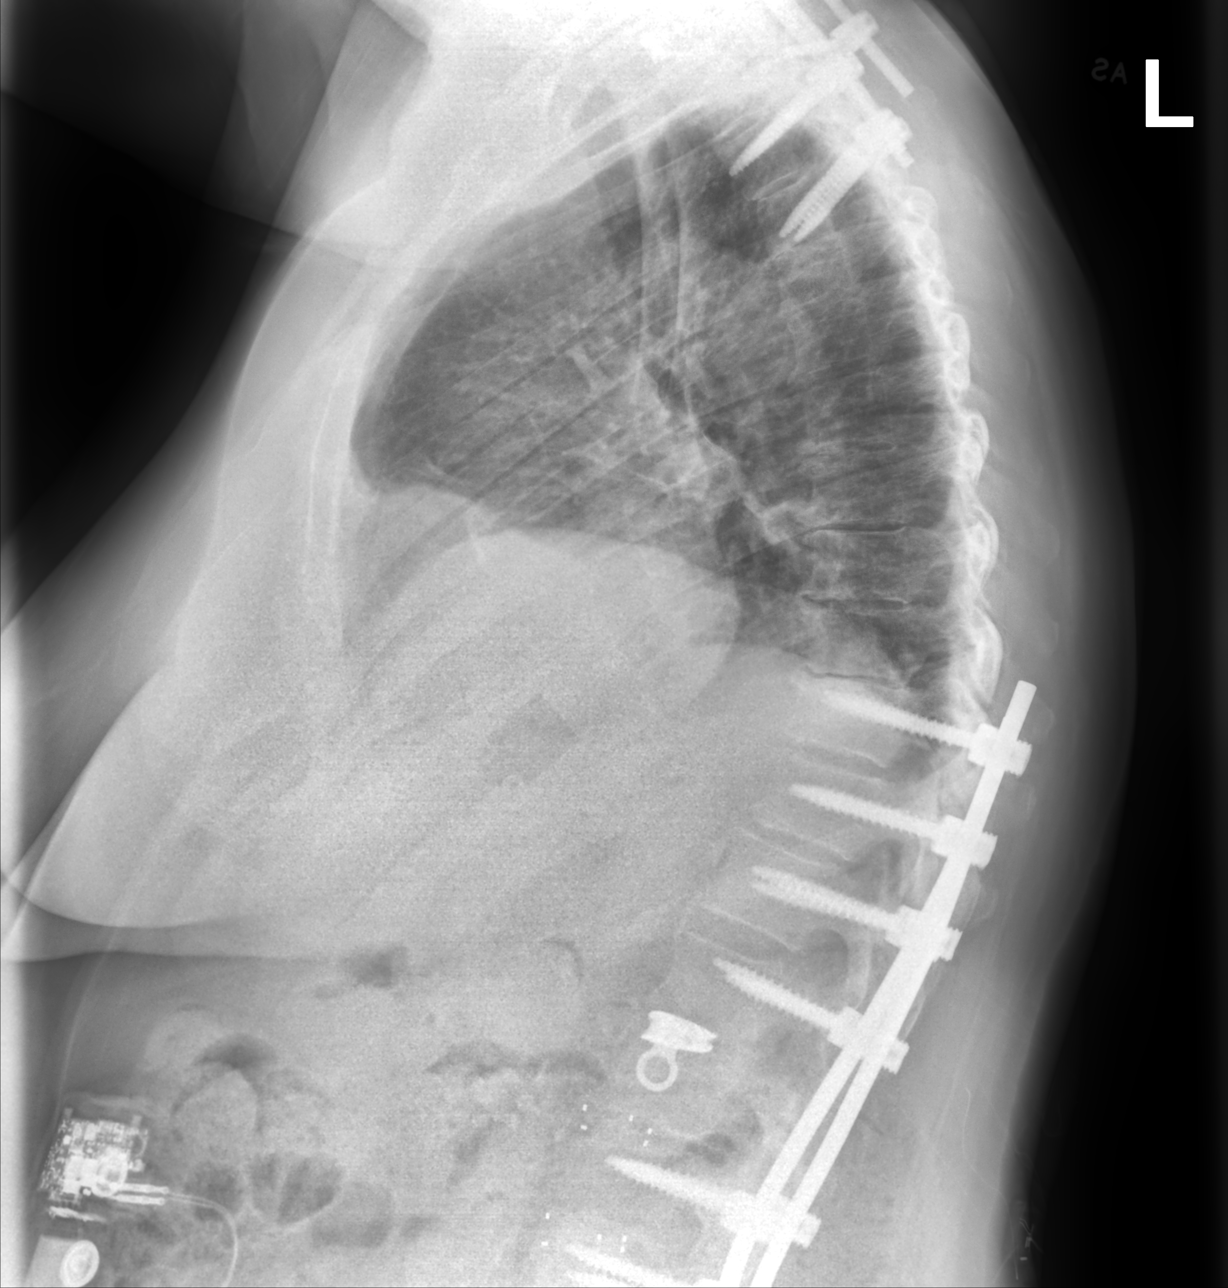

[3 of 3 positions shown; findings below may reference images not displayed]

FINDINGS: Normal heart size and pulmonary vascularity.

Atherosclerotic calcification aorta.

Persistent density at RIGHT cardiophrenic angle; prior CT exam
demonstrates large epicardial fat pad which would account for this
finding.

Minimal linear atelectasis at lingula.

No pulmonary infiltrate, pleural effusion, or pneumothorax.

Prior cervicothoracic and thoracolumbar spinal fusions.
IMPRESSION: Bibasilar atelectasis.

Large epicardial fat pad at RIGHT cardiophrenic angle.

Aortic Atherosclerosis ([AR]-[AR]).

## 2021-07-13 MED ORDER — LIDOCAINE VISCOUS HCL 2 % MT SOLN: 15.0000 mL | OROMUCOSAL | 0 refills | Status: DC | PRN

## 2021-07-13 MED ORDER — MAALOX MAX 400-400-40 MG/5ML PO SUSP: 15.0000 mL | Freq: Four times a day (QID) | ORAL | 0 refills | Status: DC | PRN

## 2021-07-13 NOTE — Discharge Instructions (Addendum)
Please go to the emergency department as soon as you leave urgent care for further evaluation and management. ?

## 2021-07-13 NOTE — ED Triage Notes (Signed)
Pt c/o cough, chest "feels heavy," sore tongue with blisters, malaise. Says was seen by ED who recommended a repeat chest xray.

## 2021-07-13 NOTE — ED Provider Notes (Signed)
[facility] EMERGENCY DEPT Provider Note   CSN: [id number] Arrival date & time: 07/13/21  1245     History Chief Complaint  Patient presents with   Chest Pain   Blisters in mouth   Cough   Otalgia    Lisa Good is a 70 y.o. female who presents the emergency department for evaluation of chest pain, blisters in mouth and urgent care concern for possible pneumothorax.  Patient was seen in the emergency department on 07/10/2021 and received a chest x-ray that showed a possible pneumothorax.  She declined a chest CT at that time and ultimately followed up with an urgent care today wanting a repeat chest x-ray.  She had a respiratory infection seen at the PCPs office on 07/04/2021 and initially treated with Augmentin and prednisone.  She has had a cough since then and at urgent care had a repeat chest x-ray that showed no pneumothorax.  She had chest heaviness at urgent care today and was ultimately referred to the emergency department for CT imaging to definitively rule out pneumothorax.  Patient also endorses new blisters to the tongue and inside of the mouth.  She states that she has been only intermittently compliant with her pantoprazole.  Currently denies shortness of breath, abdominal pain, nausea, vomiting, headache, fever or other systemic symptoms.  She does state that she has a history of acid reflux that has been worsening.   Chest Pain Associated symptoms: cough   Cough Associated symptoms: chest pain   Otalgia Associated symptoms: cough       Past Medical History:  Diagnosis Date   Allergy    seasonal   Arthritis    Depression    GERD (gastroesophageal reflux disease)    Hyperlipidemia    Hypertension    Iron deficiency anemia 11/01/2019   Neuromuscular disorder ([place])    peripheral neuropathy/feet   Sleep apnea    CPAP     Patient Active Problem List   Diagnosis Date Noted   Chronic right shoulder pain 11/11/2020   Right rotator cuff tear  arthropathy 11/11/2020   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   Chronic radicular lumbar pain 12/04/2019   Iron deficiency anemia 11/01/2019   Primary osteoarthritis of left knee 10/17/2019   Primary osteoarthritis of right shoulder 10/17/2019   Hx of cervical spine surgery 08/21/2019   Pancolitis ([place]) 07/20/2019   Kyphosis of cervical region 07/14/2019   Cervical spondylosis with myelopathy 06/28/2019   Chronic low back pain 06/28/2019   Cervical spondylosis without myelopathy 11/09/2018   Foraminal stenosis of cervical region 11/09/2018   Cervical stenosis of spinal canal 11/09/2018   History of fusion of lumbar spine 11/09/2018   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   Postlaminectomy syndrome of lumbosacral region 11/09/2018   Chronic pain syndrome 11/09/2018   Severe obesity (BMI 35.0-39.9) with comorbidity ([place]) 08/18/2018   Primary osteoarthritis of right knee 02/17/2017   Hyperlipidemia 12/02/2016   Incontinence in female 10/05/2016   Spondylolisthesis, lumbar region 05/05/2016   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   OSA (obstructive sleep apnea) 05/31/2015   Hypothyroidism 02/09/2014   Sleep disorder 02/09/2014   Asymptomatic hyperuricemia 08/29/2013   Polyarthritis 08/29/2013   Essential hypertension 06/28/2013   SUI (stress urinary incontinence, female) 06/20/2013   Insomnia 05/22/2013   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   Peripheral neuropathy 05/25/2012   Venous (peripheral) insufficiency 05/25/2012   Depression 10/03/2009   Restless legs syndrome (RLS) 10/03/2009   Fibromyalgia 02/18/2009   Gastro-esophageal reflux  disease with esophagitis 10/23/2008   Generalized anxiety disorder 10/23/2008   GERD (gastroesophageal reflux disease) 10/23/2008   Pure hypercholesterolemia 10/23/2008    Past Surgical History:  Procedure Laterality Date   BRAIN SURGERY  2002   aneurysm   COLONOSCOPY WITH PROPOFOL N/A 11/10/2019   Procedure:  COLONOSCOPY WITH PROPOFOL;  Surgeon: Jonathon Bellows, MD;  Location: [facility];  Service: Endoscopy;  Laterality: N/A;   ESOPHAGOGASTRODUODENOSCOPY (EGD) WITH PROPOFOL N/A 11/10/2019   Procedure: ESOPHAGOGASTRODUODENOSCOPY (EGD) WITH PROPOFOL;  Surgeon: Jonathon Bellows, MD;  Location: [facility];  Service: Endoscopy;  Laterality: N/A;   EXTRACORPOREAL SHOCK WAVE LITHOTRIPSY Right 04/27/2019   Procedure: EXTRACORPOREAL SHOCK WAVE LITHOTRIPSY (ESWL);  Surgeon: Billey Co, MD;  Location: [facility];  Service: Urology;  Laterality: Right;   JOINT REPLACEMENT Right    knee   KNEE ARTHROPLASTY Left 01/12/2020   Procedure: COMPUTER ASSISTED TOTAL KNEE ARTHROPLASTY;  Surgeon: Dereck Leep, MD;  Location: [facility];  Service: Orthopedics;  Laterality: Left;   POLYPECTOMY  11/10/2019   Procedure: POLYPECTOMY;  Surgeon: Jonathon Bellows, MD;  Location: [facility];  Service: Endoscopy;;   SPINAL FUSION  07/10/2019   C4-5 corpectomy with C6-7 ACDF, C2-T3 spinal fusion   SPINE SURGERY     spinal fusion   TUBAL LIGATION       OB History   No obstetric history on file.     Family History  Problem Relation Age of Onset   Hypertension Mother    Hyperlipidemia Sister    Hypertension Sister    Depression Sister    Depression Brother     Social History   Tobacco Use   Smoking status: Former    Types: Cigarettes    Quit date: 10/29/2004    Years since quitting: 16.7   Smokeless tobacco: Never  Vaping Use   Vaping Use: Never used  Substance Use Topics   Alcohol use: Yes    Comment: rarely has a glass of wine   Drug use: Never    Home Medications Prior to Admission medications   Medication Sig Start Date End Date Taking? Authorizing Provider  albuterol (VENTOLIN HFA) 108 (90 Base) MCG/ACT inhaler Inhale 1-2 puffs into the lungs every 6 (six) hours as needed for wheezing or shortness of breath. 05/21/21   Odis Luster, FNP  Alpha-Lipoic Acid 600 MG CAPS Take 1,200 mg by  mouth daily.     [provider]  amLODipine (NORVASC) 10 MG tablet Take 10 mg by mouth daily.    [provider]  atorvastatin (LIPITOR) 40 MG tablet Take 40 mg by mouth daily.    [provider]  Cholecalciferol (VITAMIN D3) 25 MCG (1000 UT) CAPS Take 1,000 Units by mouth daily.     [provider]  gabapentin (NEURONTIN) 800 MG tablet Take 1 tablet (800 mg total) by mouth 4 (four) times daily. 01/09/21   Gillis Santa, MD  guaiFENesin (MUCINEX) 600 MG 12 hr tablet Take 1 tablet (600 mg total) by mouth 2 (two) times daily. 05/21/21   Odis Luster, FNP  lisinopril (PRINIVIL,ZESTRIL) 40 MG tablet Take 40 mg by mouth daily.    [provider]  senna-docusate (SENOKOT-S) 8.6-50 MG tablet Take 1 tablet by mouth 2 (two) times daily. 01/14/20   Duanne Guess, PA-C  solifenacin (VESICARE) 5 MG tablet Take 1 tablet (5 mg total) by mouth daily. 04/15/21   Bjorn Loser, MD  solifenacin (VESICARE) 5 MG tablet Take 1 tablet (  5 mg total) by mouth daily. 04/15/21   Bjorn Loser, MD  venlafaxine (EFFEXOR) 75 MG tablet Take 75 mg by mouth 2 (two) times daily.    [provider]  venlafaxine XR (EFFEXOR-XR) 150 MG 24 hr capsule Take 150 mg by mouth daily with breakfast.  10/26/19   [provider]  Vibegron (GEMTESA) 75 MG TABS Take 75 mg by mouth daily. 05/05/21   Bjorn Loser, MD  vitamin B-12 (CYANOCOBALAMIN) 1000 MCG tablet Take 1 tablet (1,000 mcg total) by mouth daily. 11/01/19   Earlie Server, MD    Allergies    Contrast media [iodinated diagnostic agents] and Cephalexin  Review of Systems   Review of Systems  HENT:  Positive for mouth sores.   Respiratory:  Positive for cough.   Cardiovascular:  Positive for chest pain.   Physical Exam Updated Vital Signs BP (!) 176/105 (BP Location: Right Arm)   Pulse 99   Temp 98.6 F (37 C) (Oral)   Resp 16   Ht 5' 4"  (1.626 m)   Wt 88.5 kg   SpO2 97%   BMI 33.47 kg/m   Physical  Exam Vitals and nursing note reviewed.  Constitutional:      General: She is not in acute distress.    Appearance: She is well-developed.  HENT:     Head: Normocephalic and atraumatic.     Comments: Aphthous ulcers in mouth Eyes:     Conjunctiva/sclera: Conjunctivae normal.  Cardiovascular:     Rate and Rhythm: Normal rate and regular rhythm.     Heart sounds: No murmur heard. Pulmonary:     Effort: Pulmonary effort is normal. No respiratory distress.     Breath sounds: Normal breath sounds.  Abdominal:     Palpations: Abdomen is soft.     Tenderness: There is no abdominal tenderness.  Musculoskeletal:     Cervical back: Neck supple.  Skin:    General: Skin is warm and dry.  Neurological:     Mental Status: She is alert.    ED Results / Procedures / Treatments   Labs (all labs ordered are listed, but only abnormal results are displayed) Labs Reviewed - No data to display  EKG None  Radiology DG Chest 2 View  Result Date: 07/13/2021 CLINICAL DATA:  Cough, chest heaviness, sore tongue, malaise EXAM: CHEST - 2 VIEW COMPARISON:  07/10/2021 Correlation: CT abdomen and pelvis 07/20/2019 FINDINGS: Normal heart size and pulmonary vascularity. Atherosclerotic calcification aorta. Persistent density at RIGHT cardiophrenic angle; prior CT exam demonstrates large epicardial fat pad which would account for this finding. Minimal linear atelectasis at lingula. No pulmonary infiltrate, pleural effusion, or pneumothorax. Prior cervicothoracic and thoracolumbar spinal fusions. IMPRESSION: Bibasilar atelectasis. Large epicardial fat pad at RIGHT cardiophrenic angle. Aortic Atherosclerosis (ICD10-I70.0). Electronically Signed   By: Lavonia Dana M.D.   On: 07/13/2021 10:12   CT Chest Wo Contrast  Result Date: 07/13/2021 CLINICAL DATA:  Persistent cough. EXAM: CT CHEST WITHOUT CONTRAST TECHNIQUE: Multidetector CT imaging of the chest was performed following the standard protocol without IV  contrast. COMPARISON:  None. FINDINGS: Cardiovascular: No significant vascular findings. Normal heart size. No pericardial effusion. Thoracic aortic atherosclerosis. Coronary artery atherosclerosis. Mediastinum/Nodes: No enlarged mediastinal or axillary lymph nodes. Thyroid gland, trachea, and esophagus demonstrate no significant findings. Lungs/Pleura: Lungs are clear. No pleural effusion or pneumothorax. Mild right middle lobe bronchiectasis. 3 mm left lower lobe pulmonary nodule. Upper Abdomen: No acute abnormality.  Small hiatal hernia. Musculoskeletal: No acute osseous abnormality.  Posterior spinal fusion of the cervicothoracic spine partially visualized. Partially visualized posterior lumbar fusion of the thoracolumbar spine. Severe degenerative disease with disc height loss at T3-4 with developing ankylosis and bilateral facet arthropathy. Severe degenerative disease with disc height loss at T9-10 with endplate sclerosis, bilateral facet arthropathy, and bilateral foraminal narrowing. Severe osteoarthritis of bilateral glenohumeral joints. Other: Bilateral subpectoral breast implants. IMPRESSION: 1. No acute cardiopulmonary disease. 2.  Aortic Atherosclerosis (ICD10-I70.0). 3. 3 mm left solid pulmonary nodule. No routine follow-up imaging is recommended per Fleischner Society Guidelines. These guidelines do not apply to immunocompromised patients and patients with cancer. Follow up in patients with significant comorbidities as clinically warranted. For lung cancer screening, adhere to Lung-RADS guidelines. Reference: Radiology. 2017; 284(1):228-43. Electronically Signed   By: Kathreen Devoid M.D.   On: 07/13/2021 14:21    Procedures Procedures   Medications Ordered in ED Medications - No data to display  ED Course  I have reviewed the triage vital signs and the nursing notes.  Pertinent labs & imaging results that were available during my care of the patient were reviewed by me and considered in my  medical decision making (see chart for details).    MDM Rules/Calculators/A&P                           Patient seen emergency department for evaluation of mouth sores, chest heaviness and persistent cough.  Physical exam is reveals aphthous ulcers in the mouth but is otherwise unremarkable.  Patient received a chest CT that is negative for pneumothorax but does show a small pulmonary nodule does not require follow-up.  I suspect the patient's persistent cough and aphthous ulcers are likely secondary to reflux and silent aspiration especially in the setting of her noncompliance with the pantoprazole.  She was discharged with a viscous lidocaine and Maalox and encouraged to follow-up with her primary care physician, as well as be compliant with her pantoprazole.  Patient then discharged. Final Clinical Impression(s) / ED Diagnoses Final diagnoses:  None    Rx / DC Orders ED Discharge Orders     None        Balraj Brayfield, MD 07/13/21 1616

## 2021-07-13 NOTE — Discharge Instructions (Addendum)
You were seen in the emergency department for evaluation of persistent cough and chest heaviness.  Your CAT scan today did not show any evidence of a pneumothorax.  I suspect that your symptoms are likely due to bronchitis or silent aspiration from your history of reflux that may have worsened after your steroid course.  I will prescribe but is known in the ER as a "GI cocktail" that is a combination of viscous lidocaine and Maalox.  Please swish this in your mouth and swallow it over the next few days to ease the sores in your mouth that will go away with time.  Your CAT scan today did not show any evidence of infection and at this time you are safe for discharge.  Please be sure to take your pantoprazole daily and follow-up with your primary care physician.  Return the emergency department if you have new or worsening chest pain, sweating you cannot explain, shortness of breath or any other concerning symptoms.

## 2021-07-13 NOTE — ED Provider Notes (Addendum)
[facility]    CSN: [id number] Arrival date & time: 07/13/21  0834      History   Chief Complaint Chief Complaint  Patient presents with   wants xray    HPI Lisa Good is a 69 y.o. female.   Patient presents to urgent care wanting a repeat x-ray.  Patient was previously treated for upper respiratory infection at PCP on 07/04/2021 with Augmentin, prednisone, benzonatate.  Patient was then seen at urgent care on 07/10/2021 where a chest x-ray was completed due to lingering symptoms.  A small pneumothorax was found on chest x-ray and patient was sent to the hospital for further evaluation and management.  Repeat x-ray was completed at ED that did not show pneumothorax.  Provider at ED suggested CT scan but chart reports that patient declined.  Patient is stating that she did not decline CT scan and was told that she did not need CT scan for further evaluation.  Patient presents today with feelings of "chest heaviness" and persistent cough but declines chest pain or shortness of breath.  Patient also reports mouth pain and ear pain that started a few days ago.  Patient denies any fevers or upper respiratory symptoms.    Past Medical History:  Diagnosis Date   Allergy    seasonal   Arthritis    Depression    GERD (gastroesophageal reflux disease)    Hyperlipidemia    Hypertension    Iron deficiency anemia 11/01/2019   Neuromuscular disorder ([place])    peripheral neuropathy/feet   Sleep apnea    CPAP     Patient Active Problem List   Diagnosis Date Noted   Chronic right shoulder pain 11/11/2020   Right rotator cuff tear arthropathy 11/11/2020   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   Chronic radicular lumbar pain 12/04/2019   Iron deficiency anemia 11/01/2019   Primary osteoarthritis of left knee 10/17/2019   Primary osteoarthritis of right shoulder 10/17/2019   Hx of cervical spine surgery 08/21/2019   Pancolitis ([place]) 07/20/2019   Kyphosis of cervical  region 07/14/2019   Cervical spondylosis with myelopathy 06/28/2019   Chronic low back pain 06/28/2019   Cervical spondylosis without myelopathy 11/09/2018   Foraminal stenosis of cervical region 11/09/2018   Cervical stenosis of spinal canal 11/09/2018   History of fusion of lumbar spine 11/09/2018   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   Postlaminectomy syndrome of lumbosacral region 11/09/2018   Chronic pain syndrome 11/09/2018   Severe obesity (BMI 35.0-39.9) with comorbidity ([place]) 08/18/2018   Primary osteoarthritis of right knee 02/17/2017   Hyperlipidemia 12/02/2016   Incontinence in female 10/05/2016   Spondylolisthesis, lumbar region 05/05/2016   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   OSA (obstructive sleep apnea) 05/31/2015   Hypothyroidism 02/09/2014   Sleep disorder 02/09/2014   Asymptomatic hyperuricemia 08/29/2013   Polyarthritis 08/29/2013   Essential hypertension 06/28/2013   SUI (stress urinary incontinence, female) 06/20/2013   Insomnia 05/22/2013   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   Peripheral neuropathy 05/25/2012   Venous (peripheral) insufficiency 05/25/2012   Depression 10/03/2009   Restless legs syndrome (RLS) 10/03/2009   Fibromyalgia 02/18/2009   Gastro-esophageal reflux disease with esophagitis 10/23/2008   Generalized anxiety disorder 10/23/2008   GERD (gastroesophageal reflux disease) 10/23/2008   Pure hypercholesterolemia 10/23/2008    Past Surgical History:  Procedure Laterality Date   BRAIN SURGERY  2002   aneurysm   COLONOSCOPY WITH PROPOFOL N/A 11/10/2019   Procedure: COLONOSCOPY WITH PROPOFOL;  Surgeon:  Jonathon Bellows, MD;  Location: [facility];  Service: Endoscopy;  Laterality: N/A;   ESOPHAGOGASTRODUODENOSCOPY (EGD) WITH PROPOFOL N/A 11/10/2019   Procedure: ESOPHAGOGASTRODUODENOSCOPY (EGD) WITH PROPOFOL;  Surgeon: Jonathon Bellows, MD;  Location: [place];  Service: Endoscopy;  Laterality: N/A;    EXTRACORPOREAL SHOCK WAVE LITHOTRIPSY Right 04/27/2019   Procedure: EXTRACORPOREAL SHOCK WAVE LITHOTRIPSY (ESWL);  Surgeon: Billey Co, MD;  Location: [facility];  Service: Urology;  Laterality: Right;   JOINT REPLACEMENT Right    knee   KNEE ARTHROPLASTY Left 01/12/2020   Procedure: COMPUTER ASSISTED TOTAL KNEE ARTHROPLASTY;  Surgeon: Dereck Leep, MD;  Location: [facility];  Service: Orthopedics;  Laterality: Left;   POLYPECTOMY  11/10/2019   Procedure: POLYPECTOMY;  Surgeon: Jonathon Bellows, MD;  Location: [place];  Service: Endoscopy;;   SPINAL FUSION  07/10/2019   C4-5 corpectomy with C6-7 ACDF, C2-T3 spinal fusion   SPINE SURGERY     spinal fusion   TUBAL LIGATION      OB History   No obstetric history on file.      Home Medications    Prior to Admission medications   Medication Sig Start Date End Date Taking? Authorizing Provider  albuterol (VENTOLIN HFA) 108 (90 Base) MCG/ACT inhaler Inhale 1-2 puffs into the lungs every 6 (six) hours as needed for wheezing or shortness of breath. 05/21/21   Odis Luster, FNP  Alpha-Lipoic Acid 600 MG CAPS Take 1,200 mg by mouth daily.     [provider]  amLODipine (NORVASC) 10 MG tablet Take 10 mg by mouth daily.    [provider]  atorvastatin (LIPITOR) 40 MG tablet Take 40 mg by mouth daily.    [provider]  Cholecalciferol (VITAMIN D3) 25 MCG (1000 UT) CAPS Take 1,000 Units by mouth daily.     [provider]  gabapentin (NEURONTIN) 800 MG tablet Take 1 tablet (800 mg total) by mouth 4 (four) times daily. 01/09/21   Gillis Santa, MD  guaiFENesin (MUCINEX) 600 MG 12 hr tablet Take 1 tablet (600 mg total) by mouth 2 (two) times daily. 05/21/21   Odis Luster, FNP  lisinopril (PRINIVIL,ZESTRIL) 40 MG tablet Take 40 mg by mouth daily.    [provider]  senna-docusate (SENOKOT-S) 8.6-50 MG tablet Take 1 tablet by mouth 2 (two) times daily. 01/14/20   Duanne Guess, PA-C   solifenacin (VESICARE) 5 MG tablet Take 1 tablet (5 mg total) by mouth daily. 04/15/21   Bjorn Loser, MD  solifenacin (VESICARE) 5 MG tablet Take 1 tablet (5 mg total) by mouth daily. 04/15/21   Bjorn Loser, MD  venlafaxine (EFFEXOR) 75 MG tablet Take 75 mg by mouth 2 (two) times daily.    [provider]  venlafaxine XR (EFFEXOR-XR) 150 MG 24 hr capsule Take 150 mg by mouth daily with breakfast.  10/26/19   [provider]  Vibegron (GEMTESA) 75 MG TABS Take 75 mg by mouth daily. 05/05/21   Bjorn Loser, MD  vitamin B-12 (CYANOCOBALAMIN) 1000 MCG tablet Take 1 tablet (1,000 mcg total) by mouth daily. 11/01/19   Earlie Server, MD    Family History Family History  Problem Relation Age of Onset   Hypertension Mother    Hyperlipidemia Sister    Hypertension Sister    Depression Sister    Depression Brother     Social History Social History   Tobacco Use   Smoking status: Former    Types: Cigarettes  Quit date: 10/29/2004    Years since quitting: 16.7   Smokeless tobacco: Never  Vaping Use   Vaping Use: Never used  Substance Use Topics   Alcohol use: Yes    Comment: rarely has a glass of wine   Drug use: Never     Allergies   Contrast media [iodinated diagnostic agents] and Cephalexin   Review of Systems Review of Systems Per HPI  Physical Exam Triage Vital Signs ED Triage Vitals  Enc Vitals Group     BP 07/13/21 0915 (!) 141/82     Pulse Rate 07/13/21 0915 90     Resp 07/13/21 0915 18     Temp 07/13/21 0915 98.2 F (36.8 C)     Temp Source 07/13/21 0915 Oral     SpO2 07/13/21 0915 95 %     Weight --      Height --      Head Circumference --      Peak Flow --      Pain Score 07/13/21 0916 5     Pain Loc --      Pain Edu? --      Excl. in [place]? --    No data found.  Updated Vital Signs BP (!) 141/82 (BP Location: Left Arm)   Pulse 90   Temp 98.2 F (36.8 C) (Oral)   Resp 18   SpO2 95%   Visual Acuity Right Eye Distance:    Left Eye Distance:   Bilateral Distance:    Right Eye Near:   Left Eye Near:    Bilateral Near:     Physical Exam Constitutional:      General: She is not in acute distress.    Appearance: Normal appearance. She is not toxic-appearing or diaphoretic.  HENT:     Head: Normocephalic and atraumatic.     Right Ear: Ear canal normal. A middle ear effusion is present. Tympanic membrane is not erythematous or bulging.     Left Ear: Ear canal normal. A middle ear effusion is present. Tympanic membrane is not erythematous or bulging.     Nose: Nose normal.     Mouth/Throat:     Lips: Pink.     Mouth: Mucous membranes are moist.     Pharynx: Oropharynx is clear. No oropharyngeal exudate or posterior oropharyngeal erythema.     Comments: Patient has red dye color to tongue as she states that she has taken a cough drop prior to arrival.  Unable to completely visualize tongue. Eyes:     Extraocular Movements: Extraocular movements intact.     Conjunctiva/sclera: Conjunctivae normal.  Cardiovascular:     Rate and Rhythm: Normal rate and regular rhythm.     Pulses: Normal pulses.     Heart sounds: Normal heart sounds.  Pulmonary:     Effort: Pulmonary effort is normal. No respiratory distress.     Breath sounds: Normal breath sounds.  Skin:    General: Skin is warm and dry.  Neurological:     General: No focal deficit present.     Mental Status: She is alert and oriented to person, place, and time. Mental status is at baseline.  Psychiatric:        Mood and Affect: Mood normal.        Behavior: Behavior normal.        Thought Content: Thought content normal.        Judgment: Judgment normal.     UC Treatments / Results  Labs (  all labs ordered are listed, but only abnormal results are displayed) Labs Reviewed - No data to display  EKG   Radiology DG Chest 2 View  Result Date: 07/13/2021 CLINICAL DATA:  Cough, chest heaviness, sore tongue, malaise EXAM: CHEST - 2 VIEW  COMPARISON:  07/10/2021 Correlation: CT abdomen and pelvis 07/20/2019 FINDINGS: Normal heart size and pulmonary vascularity. Atherosclerotic calcification aorta. Persistent density at RIGHT cardiophrenic angle; prior CT exam demonstrates large epicardial fat pad which would account for this finding. Minimal linear atelectasis at lingula. No pulmonary infiltrate, pleural effusion, or pneumothorax. Prior cervicothoracic and thoracolumbar spinal fusions. IMPRESSION: Bibasilar atelectasis. Large epicardial fat pad at RIGHT cardiophrenic angle. Aortic Atherosclerosis (ICD10-I70.0). Electronically Signed   By: Lavonia Dana M.D.   On: 07/13/2021 10:12    Procedures Procedures (including critical care time)  Medications Ordered in UC Medications - No data to display  Initial Impression / Assessment and Plan / UC Course  I have reviewed the triage vital signs and the nursing notes.  Pertinent labs & imaging results that were available during my care of the patient were reviewed by me and considered in my medical decision making (see chart for details).     Each chest x-ray that has been completed over the past few days have been significantly different.  Chest x-ray currently showing bibasilar atelectasis.  I do think it is necessary for patient to have a CT image of the chest for further evaluation and work-up of persistent cough and feelings of "chest heaviness" due to these inconsistencies and no improvement in patient symptoms.  Patient was advised to go to the hospital for further evaluation and management.  Patient was agreeable with plan.  Vital signs stable at discharge.  Agree with patient self transport to the hospital.  Unable to determine cause of patient's sore tongue as red dye from cough drop is interfering with physical exam.  Although suspect that thrush could be present but not able to be fully visualized on physical exam so unable to determine etiology. No other physical abnormalities. Also  suspect that mouth pain could be a result of forceful coughing given patient's physical exam.  Patient has bilateral middle ear effusion present that is causing ear discomfort.  Advised patient to take Zyrtec and Flonase to help alleviate symptoms.  Will defer further treatment and evaluation to emergency department.Discussed strict return precautions. Patient verbalized understanding and is agreeable with plan.  Final Clinical Impressions(s) / UC Diagnoses   Final diagnoses:  Abnormal chest x-ray  Acute cough     Discharge Instructions      Please go to the emergency department as soon as you leave urgent care for further evaluation and management.     ED Prescriptions   None    PDMP not reviewed this encounter.   Odis Luster, FNP 07/13/21 [place], [place], FNP 07/13/21 1053

## 2021-07-13 NOTE — ED Triage Notes (Signed)
Pt sent from UC due to chest heaviness and blisters in her mouth. Cough with occ green secretions, pt has had symptoms for about a week and has been seen 5 times for symptoms,

## 2021-08-13 ENCOUNTER — Other Ambulatory Visit: Payer: Self-pay | Admitting: Physician Assistant

## 2021-08-13 ENCOUNTER — Other Ambulatory Visit (HOSPITAL_BASED_OUTPATIENT_CLINIC_OR_DEPARTMENT_OTHER): Payer: Self-pay | Admitting: Physician Assistant

## 2021-08-13 DIAGNOSIS — R109 Unspecified abdominal pain: Secondary | ICD-10-CM

## 2021-08-19 ENCOUNTER — Other Ambulatory Visit: Payer: Self-pay

## 2021-08-19 ENCOUNTER — Ambulatory Visit
Admission: RE | Admit: 2021-08-19 | Discharge: 2021-08-19 | Disposition: A | Discharge status: Home / Self Care | Payer: Medicare Other | Source: Ambulatory Visit | Attending: Physician Assistant | Admitting: Physician Assistant

## 2021-08-19 DIAGNOSIS — R109 Unspecified abdominal pain: Secondary | ICD-10-CM | POA: Diagnosis not present

## 2021-10-13 ENCOUNTER — Other Ambulatory Visit: Payer: Self-pay | Admitting: Student

## 2021-10-13 DIAGNOSIS — M19011 Primary osteoarthritis, right shoulder: Secondary | ICD-10-CM

## 2021-10-24 ENCOUNTER — Other Ambulatory Visit: Payer: Self-pay

## 2021-10-24 ENCOUNTER — Ambulatory Visit (HOSPITAL_BASED_OUTPATIENT_CLINIC_OR_DEPARTMENT_OTHER)
Admission: RE | Admit: 2021-10-24 | Discharge: 2021-10-24 | Disposition: A | Discharge status: Home / Self Care | Payer: Medicare Other | Source: Ambulatory Visit | Attending: Student | Admitting: Student

## 2021-10-24 DIAGNOSIS — M19011 Primary osteoarthritis, right shoulder: Secondary | ICD-10-CM | POA: Diagnosis not present

## 2021-10-24 IMAGING — CT CT SHOULDER*R* W/O CM
3 of 6 series · 13 of 36 positions shown, 15 images · non-contrast
Comparison: None.

CLINICAL DATA: Right shoulder pain. Patient reports pain has
increased over time.

EXAM:
CT OF THE UPPER RIGHT EXTREMITY WITHOUT CONTRAST
TECHNIQUE: Multidetector CT imaging of the upper right extremity was performed
according to the standard protocol.
RADIATION DOSE REDUCTION: This exam was performed according to the
departmental dose-optimization program which includes automated
exposure control, adjustment of the mA and/or kV according to
patient size and/or use of iterative reconstruction technique.

[Series 5: cor bone · coronal · 0.36mm/px · 2 of 92 slices shown]
[im 16/92  bone]
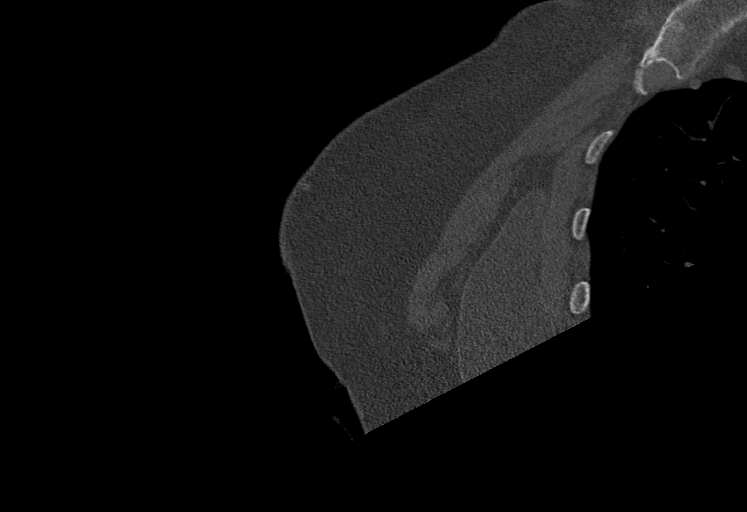
[im 54/92  bone]
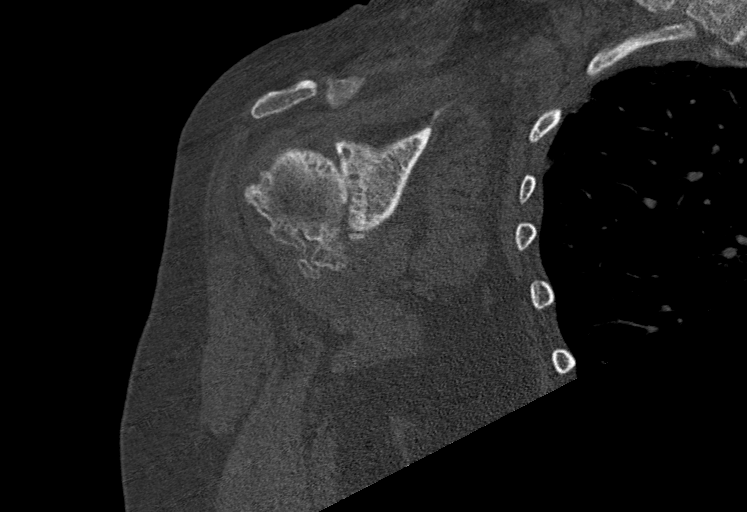

[Series 7: ax st · axial · 0.41mm/px · z∈[-150,-23]mm · 5 of 107 slices shown, 7 images]
[im 18/107  soft-tissue]
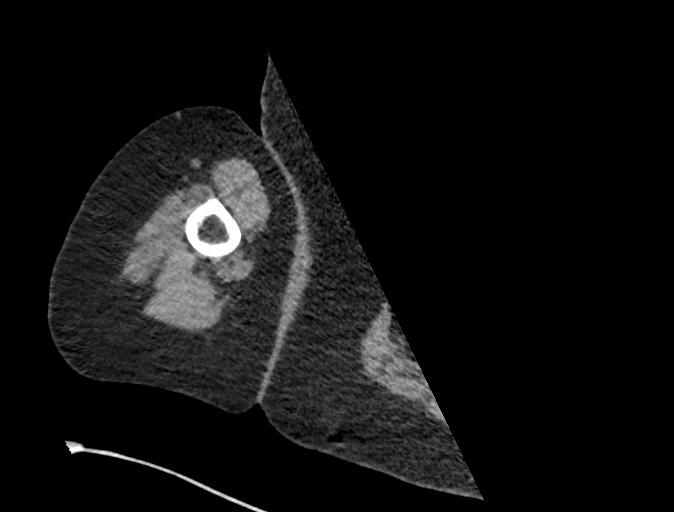
[im 18/107  bone]
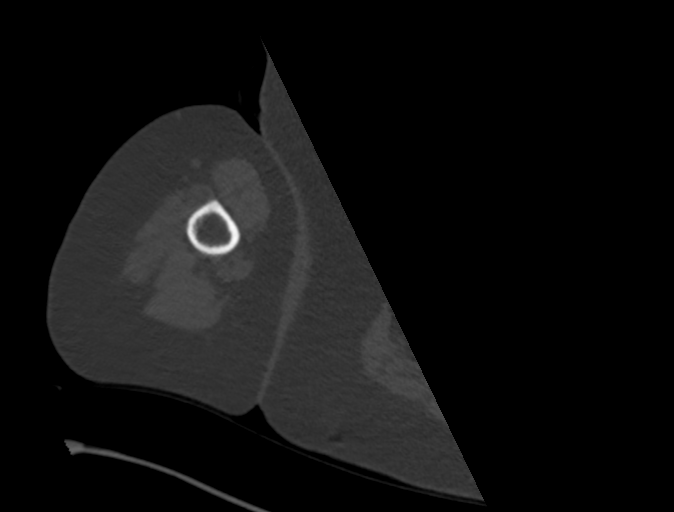
[im 36/107  bone]
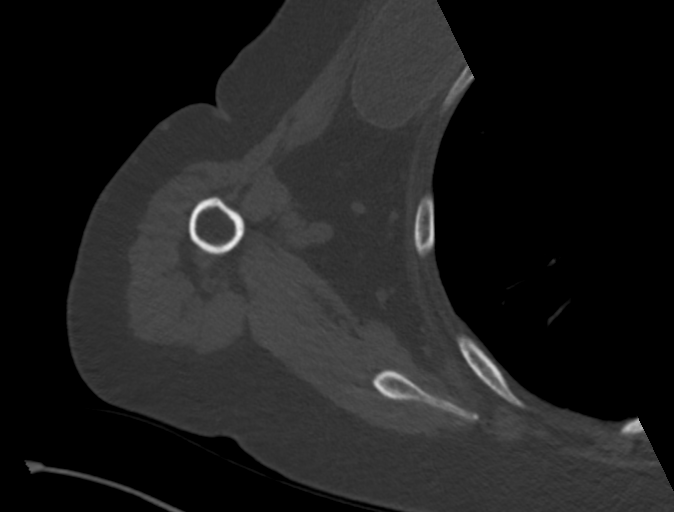
[im 54/107  bone]
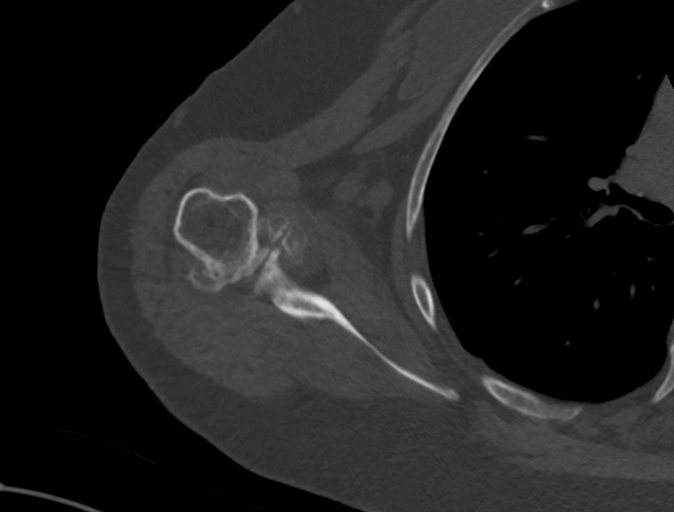
[im 71/107  bone]
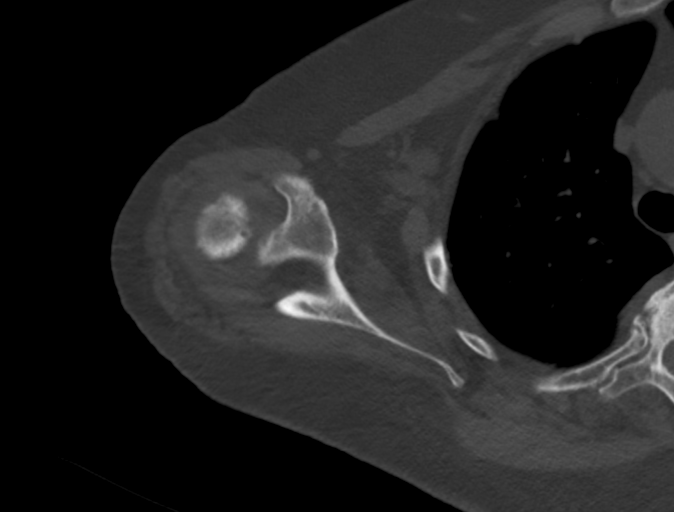
[im 89/107  soft-tissue]
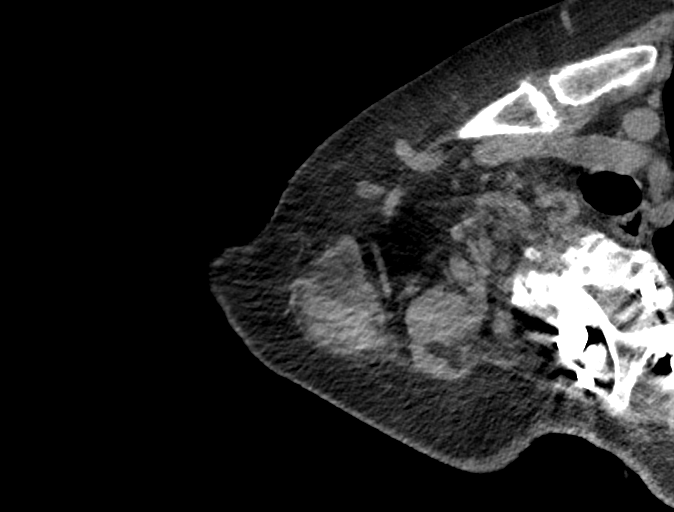
[im 89/107  bone]
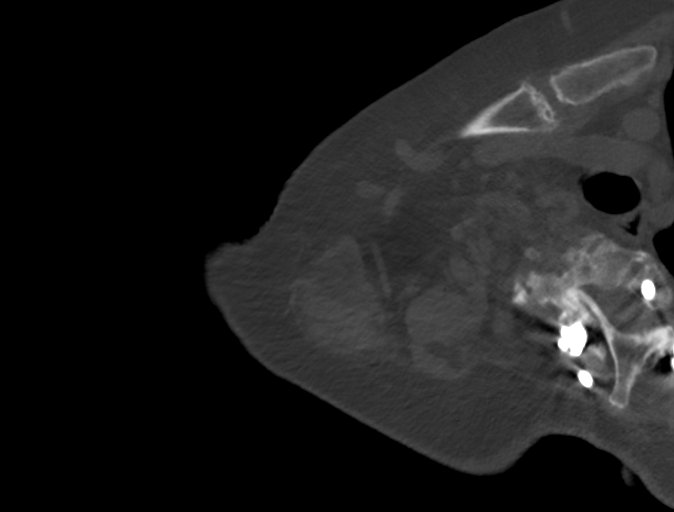

[Series 9: sag st · sagittal · 0.44mm/px · 6 of 158 slices shown]
[im 28/158  bone]
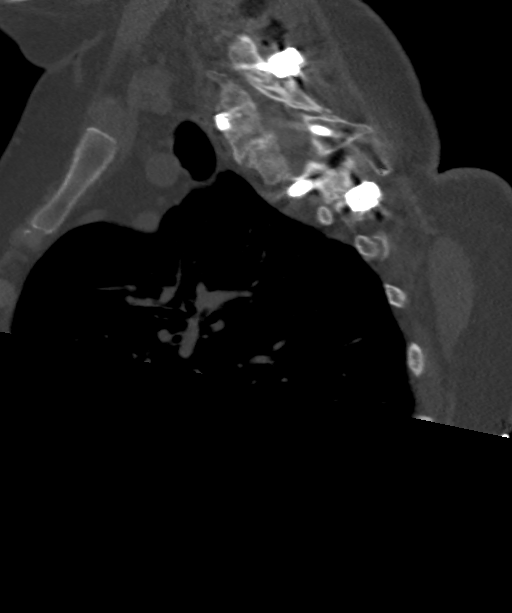
[im 41/158  bone]
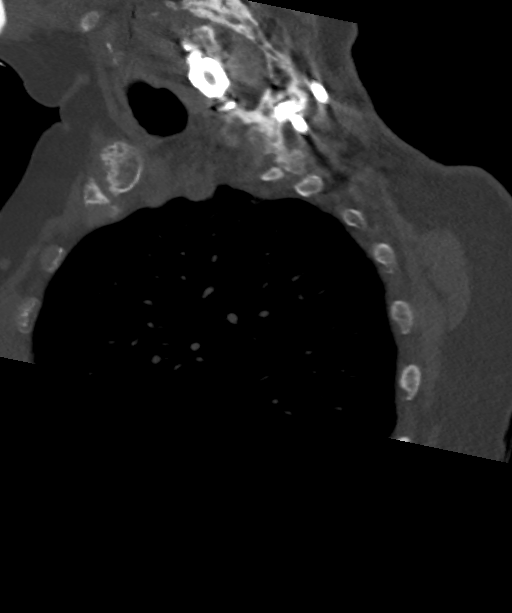
[im 49/158  soft-tissue]
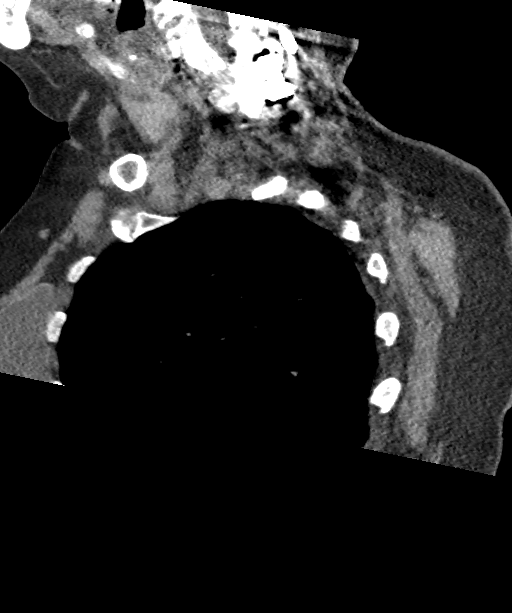
[im 54/158  bone]
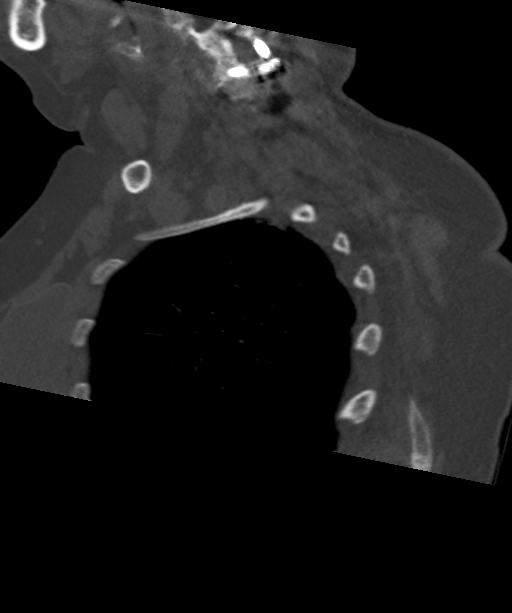
[im 67/158  bone]
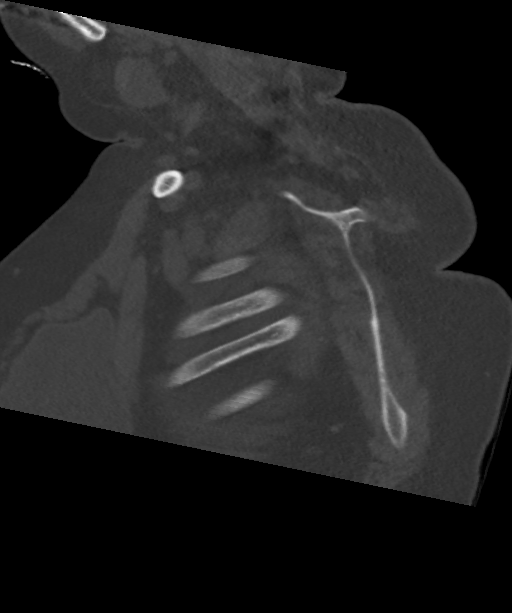
[im 80/158  bone]
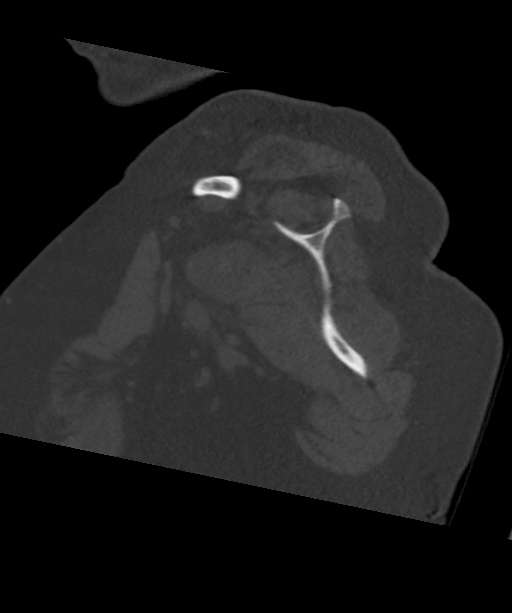

[13 of 36 positions shown; findings below may reference images not displayed]

FINDINGS: Bones/Joint/Cartilage

No fracture or dislocation. Normal alignment. Moderate joint
effusion. 12 mm loose body in the axillary recess.

Severe osteoarthritis of the glenohumeral joint with severe joint
space narrowing, a bone-on-bone appearance, subchondral sclerosis,
subchondral cystic changes and marginal osteophytosis.

Mild arthropathy of the acromioclavicular joint. Mild osteoarthritis
of the right sternoclavicular joint.

Posterior cervical spinal fusion.

Ligaments

Ligaments are suboptimally evaluated by CT.

Muscles and Tendons
Muscles are normal.  No muscle atrophy.

Soft tissue
No fluid collection or hematoma. No soft tissue mass. 6 mm pulmonary
nodule in the right lower lobe.
IMPRESSION: 1. Severe osteoarthritis of the right shoulder.
2. A 6 mm right solid pulmonary nodule. Recommend a non-contrast
Chest CT at 6-12 months. If patient is high risk for malignancy,
recommend an additional non-contrast Chest CT at 18-24 months; if
patient is low risk for malignancy a non-contrast Chest CT at 18-24
months is optional. These guidelines do not apply to
immunocompromised patients and patients with cancer. Follow up in
patients with significant comorbidities as clinically warranted. For
lung cancer screening, adhere to Lung-RADS guidelines. Reference:

## 2021-10-29 ENCOUNTER — Institutional Professional Consult (permissible substitution): Payer: Medicare Other | Admitting: Neurology

## 2021-11-05 ENCOUNTER — Encounter: Payer: Self-pay | Admitting: *Deleted

## 2021-11-06 ENCOUNTER — Ambulatory Visit (INDEPENDENT_AMBULATORY_CARE_PROVIDER_SITE_OTHER): Payer: Medicare Other | Admitting: Neurology

## 2021-11-06 ENCOUNTER — Other Ambulatory Visit: Payer: Self-pay

## 2021-11-06 ENCOUNTER — Encounter: Payer: Self-pay | Admitting: Neurology

## 2021-11-06 VITALS — BP 141/74 | HR 99 | Ht 66.0 in | Wt 207.2 lb

## 2021-11-06 DIAGNOSIS — E669 Obesity, unspecified: Secondary | ICD-10-CM | POA: Diagnosis not present

## 2021-11-06 DIAGNOSIS — G4733 Obstructive sleep apnea (adult) (pediatric): Secondary | ICD-10-CM

## 2021-11-06 DIAGNOSIS — Z789 Other specified health status: Secondary | ICD-10-CM

## 2021-11-06 DIAGNOSIS — R351 Nocturia: Secondary | ICD-10-CM

## 2021-11-06 DIAGNOSIS — G4719 Other hypersomnia: Secondary | ICD-10-CM

## 2021-11-06 DIAGNOSIS — R519 Headache, unspecified: Secondary | ICD-10-CM

## 2021-11-06 DIAGNOSIS — G4731 Primary central sleep apnea: Secondary | ICD-10-CM

## 2021-11-06 NOTE — Progress Notes (Signed)
Subjective:    Patient ID: Lisa Good is a 71 y.o. female.  HPI    Lisa Age, Lisa Good, Lisa Good [facility] [address] P.O. [place], [place] [address]  Dear Dr. Carrie Mew,  I saw your patient, Lisa Good, who is in the sleep clinic today for initial consultation of her sleep disorder, in particular, evaluation of her prior diagnosis of obstructive sleep apnea.  The patient is accompanied today.  As you know, Lisa Good is a 71 year old right-handed woman with an underlying medical history of allergic rhinitis, arthritis, chondromalacia, degenerative lumbar disc disease, fibromyalgia, reflux disease, hypertension, chronic kidney disease, peripheral neuropathy, thyroid disease, history of DVT, depression, and obesity, who reports nonrestorative sleep, difficulty sleeping through the night.  She does snore.  She is sleepy during the day.  Her Epworth sleepiness score is 17 out of 24, fatigue severity score is 51 out of 63.  She reports leg pain at night.  Of note, she takes high-dose gabapentin, 800 mg 4 times daily.  She also takes Effexor XR 225 mg daily.  She recalls having had 2 sleep studies.  She also reports that she was initially on CPAP and when it was changed to BiPAP therapy.   She was previously diagnosed with obstructive sleep apnea and placed on positive airway pressure treatment.  I reviewed your office note from 08/12/2021.  Prior sleep study results are not available for my review today.  She had been followed by neurology at [facility] and had also seen neurology through [facility] in 2018 for her sleep apnea.  According to the office note by Dolores Patty, NP, the patient's baseline nocturnal polysomnogram revealed an RDI of 30/h and O2 nadir of 83% with testing in April 2016.  It looks like she was on auto BiPAP therapy at the time.  Her residual AHI reportedly was 8.6/h with central events at 3/h.   I reviewed limited  compliance data available from 2022.  Machine 12 days out of 365 days with limited usage and May and June 2022.  Average usage was 2 hours and 25 minutes, residual AHI elevated at 19.8/h, with mostly obstructive events and central index of 6.3/h.  She has a auto BiPAP machine with a maximum IPAP of 16, minimum EPAP of 8 cm and 5 cm of pressure support.  She had trouble tolerating the mask, it was cumbersome.  Bedtime is generally between 8 and 9 PM but she may not be asleep until 2 or 3 AM and rise time between 5 and 6 but she may go to the living room and sleep there for another 2 to 3 hours.  She sleeps with a cold pack in her socks to alleviate foot pain.  Her weight has been more or less stable.  She had a tonsillectomy in her early 53s.  She has nocturia about 3 times per average night and does have occasional morning headaches.  She drinks caffeine in the form of tea, about 1 or 2 bottles per day.  She drinks alcohol rarely.  She quit smoking some 10 or 12 years ago.  Her Past Medical History Is Significant For: Past Medical History:  Diagnosis Date   Allergy    seasonal   Arthritis    Depression    GERD (gastroesophageal reflux disease)    Hyperlipidemia    Hypertension    Iron deficiency anemia 11/01/2019   Neuromuscular disorder ([place])    peripheral neuropathy/feet   Sleep apnea  CPAP     Her Past Surgical History Is Significant For: Past Surgical History:  Procedure Laterality Date   BRAIN SURGERY  2002   aneurysm   COLONOSCOPY WITH PROPOFOL N/A 11/10/2019   Procedure: COLONOSCOPY WITH PROPOFOL;  Surgeon: Jonathon Bellows, Lisa Good;  Location: [place];  Service: Endoscopy;  Laterality: N/A;   ESOPHAGOGASTRODUODENOSCOPY (EGD) WITH PROPOFOL N/A 11/10/2019   Procedure: ESOPHAGOGASTRODUODENOSCOPY (EGD) WITH PROPOFOL;  Surgeon: Jonathon Bellows, Lisa Good;  Location: [place];  Service: Endoscopy;  Laterality: N/A;   EXTRACORPOREAL SHOCK WAVE LITHOTRIPSY Right 04/27/2019   Procedure:  EXTRACORPOREAL SHOCK WAVE LITHOTRIPSY (ESWL);  Surgeon: Billey Co, Lisa Good;  Location: [facility];  Service: Urology;  Laterality: Right;   JOINT REPLACEMENT Right    knee   KNEE ARTHROPLASTY Left 01/12/2020   Procedure: COMPUTER ASSISTED TOTAL KNEE ARTHROPLASTY;  Surgeon: Dereck Leep, Lisa Good;  Location: [facility];  Service: Orthopedics;  Laterality: Left;   POLYPECTOMY  11/10/2019   Procedure: POLYPECTOMY;  Surgeon: Jonathon Bellows, Lisa Good;  Location: [place];  Service: Endoscopy;;   SPINAL FUSION  07/10/2019   C4-5 corpectomy with C6-7 ACDF, C2-T3 spinal fusion   SPINE SURGERY     spinal fusion   TUBAL LIGATION      Her Family History Is Significant For: Family History  Problem Relation Good of Onset   Hypertension Mother    Hyperlipidemia Sister    Hypertension Sister    Depression Sister    Depression Brother    Sleep apnea Neg Hx     Her Social History Is Significant For: Social History   Socioeconomic History   Marital status: Married    Spouse name: Not on file   Number of children: Not on file   Years of education: Not on file   Highest education level: Not on file  Occupational History   Occupation: retired   Tobacco Use   Smoking status: Former    Types: Cigarettes    Quit date: 10/29/2004    Years since quitting: 17.0   Smokeless tobacco: Never  Vaping Use   Vaping Use: Never used  Substance and Sexual Activity   Alcohol use: Yes    Comment: rarely has a glass of wine   Drug use: Never   Sexual activity: Not Currently  Other Topics Concern   Not on file  Social History Narrative   Not on file   Social Determinants of Health   Financial Resource Strain: Not on file  Food Insecurity: Not on file  Transportation Needs: Not on file  Physical Activity: Not on file  Stress: Not on file  Social Connections: Not on file    Her Allergies Are:  Allergies  Allergen Reactions   Contrast Media [Iodinated Contrast Media] Hives   Cephalexin Nausea Only  :    Her Current Medications Are:  Outpatient Encounter Medications as of 11/06/2021  Medication Sig   Alpha-Lipoic Acid 600 MG CAPS Take 1,200 mg by mouth daily.    alum & mag hydroxide-simeth (MAALOX MAX) 400-400-40 MG/5ML suspension Take 15 mLs by mouth every 6 (six) hours as needed for indigestion.   amLODipine (NORVASC) 10 MG tablet Take 10 mg by mouth daily.   ARIPiprazole (ABILIFY) 5 MG tablet Take 5 mg by mouth daily.   atorvastatin (LIPITOR) 40 MG tablet Take 40 mg by mouth daily.   Cholecalciferol (VITAMIN D3) 25 MCG (1000 UT) CAPS Take 1,000 Units by mouth daily.    gabapentin (NEURONTIN) 800 MG tablet Take  1 tablet (800 mg total) by mouth 4 (four) times daily.   guaiFENesin (MUCINEX) 600 MG 12 hr tablet Take 1 tablet (600 mg total) by mouth 2 (two) times daily.   lisinopril (PRINIVIL,ZESTRIL) 40 MG tablet Take 40 mg by mouth daily.   senna-docusate (SENOKOT-S) 8.6-50 MG tablet Take 1 tablet by mouth 2 (two) times daily.   solifenacin (VESICARE) 5 MG tablet Take 1 tablet (5 mg total) by mouth daily.   venlafaxine (EFFEXOR) 75 MG tablet Take 75 mg by mouth 2 (two) times daily.   venlafaxine XR (EFFEXOR-XR) 150 MG 24 hr capsule Take 150 mg by mouth daily with breakfast.    vitamin B-12 (CYANOCOBALAMIN) 1000 MCG tablet Take 1 tablet (1,000 mcg total) by mouth daily.   albuterol (VENTOLIN HFA) 108 (90 Base) MCG/ACT inhaler Inhale 1-2 puffs into the lungs every 6 (six) hours as needed for wheezing or shortness of breath.   Baclofen 5 MG TABS Take by mouth.   lidocaine (XYLOCAINE) 2 % solution Use as directed 15 mLs in the mouth or throat as needed for mouth pain.   solifenacin (VESICARE) 5 MG tablet Take 1 tablet (5 mg total) by mouth daily.   Vibegron (GEMTESA) 75 MG TABS Take 75 mg by mouth daily.   No facility-administered encounter medications on file as of 11/06/2021.  :   Review of Systems:  Out of a complete 14 point review of systems, all are reviewed and negative with the  exception of these symptoms as listed below:  Review of Systems  Neurological:        Pt is here for sleep consult. Pt states she had a sleep study done 6 years ago she does have her CPAP here for appointment . Pt states has not used CPAP in a year  ESS:17 FSS:51   Objective:  Neurological Exam  Physical Exam Physical Examination:   Vitals:   11/06/21 1238  BP: (!) 141/74  Pulse: 99    General Examination: The patient is a very pleasant 71 y.o. female in no acute distress. She appears well-developed and well-nourished and well groomed.   HEENT: Normocephalic, atraumatic, pupils are equal, round and reactive to light, extraocular tracking is good without limitation to gaze excursion or nystagmus noted. Hearing is grossly intact. Face is symmetric with normal facial animation. Speech is clear with no dysarthria noted. There is no hypophonia. There is no lip, neck/head, jaw or voice tremor. Neck is supple with full range of passive and active motion. There are no carotid bruits on auscultation. Oropharynx exam reveals: mod. mouth dryness, adequate dental hygiene and moderate airway crowding, due to small airway entry.  Tonsils are absent.  Chest: Clear to auscultation without wheezing, rhonchi or crackles noted.  Heart: S1+S2+0, regular and normal without murmurs, rubs or gallops noted.   Abdomen: Soft, non-tender and non-distended.  Extremities: There is no obvious swelling.  Skin: Warm and dry without trophic changes noted.   Musculoskeletal: exam reveals significant decreased range of motion in the right shoulder.  She walks with a significant limp.    Neurologically:  Mental status: The patient is awake, alert and oriented in all 4 spheres. Her immediate and remote memory, attention, language skills and fund of knowledge are appropriate. There is no evidence of aphasia, agnosia, apraxia or anomia. Speech is clear with normal prosody and enunciation. Thought process is linear.  Mood is normal and affect is normal.  Cranial nerves II - XII are as described above under HEENT exam.  Motor exam: Normal bulk, strength and tone is noted. There is no tremor. Fine motor skills and coordination: grossly intact.  Cerebellar testing: No dysmetria or intention tremor. There is no truncal or gait ataxia.  Sensory exam: intact to light touch in the upper and lower extremities.  Gait, station and balance: She stands with difficulty and walks with a significant limp.  Assessment and Plan:  In summary, Sadye Maudlin is a very pleasant 71 y.o.-year old female with an underlying medical history of allergic rhinitis, arthritis, chondromalacia, degenerative lumbar disc disease, fibromyalgia, reflux disease, hypertension, chronic kidney disease, peripheral neuropathy, thyroid disease, history of DVT, depression, and obesity, who presents for evaluation of her sleep disordered breathing.  She was diagnosed with obstructive sleep apnea, she also had some central apneas, is not sure if she developed central apneas after being on treatment or if she had primary central apneas.  She was placed on auto BiPAP therapy but has not used her machine but for a few days last year.  She is endorsing daytime somnolence.  She has trouble maintaining sleep.  She is advised to proceed with sleep testing.  I outlined the differences between a laboratory attended sleep study versus home sleep test and we talked about treatment options including surgical and nonsurgical sleep apnea treatment options.  If she has significant central apneas, BiPAP therapy may be her best option.  She would be willing to consider this.  We will keep her posted as to her test results by phone call and plan to follow-up in sleep clinic accordingly.   I answered all her questions today and the patient was in agreement.  Thank you very much for allowing me to participate in the care of this nice patient. If I can be of any further assistance to  you please do not hesitate to call me at [phone].  Sincerely,   Lisa Age, Lisa Good, Lisa Good

## 2021-11-06 NOTE — Patient Instructions (Signed)
It was nice to meet you today!   Here is what we discussed today and what we came up with as our plan for you:    Based on your symptoms and your exam I believe you are still at risk for obstructive sleep apnea and would benefit from re-evaluation as it has been many years and you may qualify for a new machine. Therefore, I think we should proceed with a sleep study to determine how severe your sleep apnea is. If you have more than mild OSA, I want you to consider ongoing treatment with CPAP. Please remember, the risks and ramifications of moderate to severe obstructive sleep apnea or OSA are: Cardiovascular disease, including congestive heart failure, stroke, difficult to control hypertension, arrhythmias, and even type 2 diabetes has been linked to untreated OSA. Sleep apnea causes disruption of sleep and sleep deprivation in most cases, which, in turn, can cause recurrent headaches, problems with memory, mood, concentration, focus, and vigilance. Most people with untreated sleep apnea report excessive daytime sleepiness, which can affect their ability to drive. Please do not drive if you feel sleepy.   I will likely see you back after your sleep study to go over the test results and where to go from there. We will call you after your sleep study to advise about the results (most likely, you will hear from [place], my nurse) and to set up an appointment at the time, as necessary.    Our sleep lab administrative assistant will call you to schedule your sleep study. If you don't hear back from her by about 2 weeks from now, please feel free to call her at [phone]. You can leave a message with your phone number and concerns, if you get the voicemail box. She will call back as soon as possible.

## 2021-11-21 ENCOUNTER — Encounter: Payer: Self-pay | Admitting: Student in an Organized Health Care Education/Training Program

## 2021-11-24 ENCOUNTER — Ambulatory Visit (INDEPENDENT_AMBULATORY_CARE_PROVIDER_SITE_OTHER): Payer: Medicare Other | Admitting: Neurology

## 2021-11-24 ENCOUNTER — Encounter: Payer: Self-pay | Admitting: *Deleted

## 2021-11-24 ENCOUNTER — Encounter: Payer: Self-pay | Admitting: Urology

## 2021-11-24 DIAGNOSIS — G4733 Obstructive sleep apnea (adult) (pediatric): Secondary | ICD-10-CM | POA: Diagnosis not present

## 2021-11-24 DIAGNOSIS — N3946 Mixed incontinence: Secondary | ICD-10-CM

## 2021-11-24 DIAGNOSIS — E66811 Obesity, class 1: Secondary | ICD-10-CM

## 2021-11-24 DIAGNOSIS — G4719 Other hypersomnia: Secondary | ICD-10-CM

## 2021-11-24 DIAGNOSIS — E669 Obesity, unspecified: Secondary | ICD-10-CM

## 2021-11-24 DIAGNOSIS — R351 Nocturia: Secondary | ICD-10-CM

## 2021-11-24 DIAGNOSIS — R519 Headache, unspecified: Secondary | ICD-10-CM

## 2021-11-24 DIAGNOSIS — Z789 Other specified health status: Secondary | ICD-10-CM

## 2021-11-24 DIAGNOSIS — G4731 Primary central sleep apnea: Secondary | ICD-10-CM

## 2021-11-24 MED ORDER — GEMTESA 75 MG PO TABS: 75.0000 mg | ORAL_TABLET | Freq: Every day | ORAL | 3 refills | Status: DC

## 2021-11-26 ENCOUNTER — Other Ambulatory Visit: Payer: Self-pay

## 2021-11-26 DIAGNOSIS — N3946 Mixed incontinence: Secondary | ICD-10-CM

## 2021-11-26 MED ORDER — GEMTESA 75 MG PO TABS: 75.0000 mg | ORAL_TABLET | Freq: Every day | ORAL | 3 refills | Status: DC

## 2021-11-26 NOTE — Telephone Encounter (Signed)
Resent medication . See mychart message

## 2021-11-26 NOTE — Progress Notes (Signed)
° °  [facility]  HOME SLEEP TEST (Watch PAT) REPORT  STUDY DATE: 11/24/2021  DOB: 1951/04/26  MRN: [id number]  ORDERING CLINICIAN: Star Age, MD, PhD   REFERRING CLINICIAN: [name], MD   CLINICAL INFORMATION/HISTORY: 71 year old right-handed woman with an underlying medical history of allergic rhinitis, arthritis, chondromalacia, degenerative lumbar disc disease, fibromyalgia, reflux disease, hypertension, chronic kidney disease, peripheral neuropathy, thyroid disease, history of DVT, depression, and obesity, who was diagnosed with sleep apnea years ago and has been on autoBiPAP.  She presents for reevaluation.  Epworth sleepiness score: 17/24.  BMI: 33.3 kg/m  FINDINGS:   Sleep Summary:   Total Recording Time (hours, min): 9 hours, 53 minutes  Total Sleep Time (hours, min):  8 hours, 26 minutes   Percent REM (%):    1.5%   Respiratory Indices:   Calculated pAHI (per hour):  100.6/hour         REM pAHI:    n/a       NREM pAHI: 100.6/hour  Oxygen Saturation Statistics:    Oxygen Saturation (%) Mean: 92%   Minimum oxygen saturation (%):                 83%   O2 Saturation Range (%): 83-98%    O2 Saturation (minutes) <=88%: 11.2 min  Pulse Rate Statistics:   Pulse Mean (bpm):    84/min    Pulse Range (56-128/min)   IMPRESSION: OSA (obstructive sleep apnea), severe  RECOMMENDATION:  This home sleep test demonstrates severe obstructive sleep apnea with a total AHI of 100.6/hour and O2 nadir of 83%.  Snoring ranged from mild to moderate mostly, at times loud.  Ongoing treatment with positive airway pressure is highly recommended.  I will write for a new AutoPap machine.  She may benefit from a full night laboratory attended titration as she has been on BiPAP therapy and has had some central apneas.  Her central respiratory index was 18.5/h.  Alternative treatment options are limited secondary to the severity of the patient's sleep  disordered breathing.  Concomitant weight loss is recommended.  Please note, that untreated obstructive sleep apnea may carry additional perioperative morbidity. Patients with significant obstructive sleep apnea should receive perioperative PAP therapy and the surgeons and particularly the anesthesiologist should be informed of the diagnosis and the severity of the sleep disordered breathing. The patient should be cautioned not to drive, work at heights, or operate dangerous or heavy equipment when tired or sleepy. Review and reiteration of good sleep hygiene measures should be pursued with any patient. Other causes of the patient's symptoms, including circadian rhythm disturbances, an underlying mood disorder, medication effect and/or an underlying medical problem cannot be ruled out based on this test. Clinical correlation is recommended. The patient and her referring provider will be notified of the test results. The patient will be seen in follow up in sleep clinic at [facility].  I certify that I have reviewed the raw data recording prior to the issuance of this report in accordance with the standards of the American Academy of Sleep Medicine (AASM).   INTERPRETING PHYSICIAN:   Star Age, MD, PhD  Board Certified in Neurology and Sleep Medicine  [facility] [address] [place], [place] [address] [phone]

## 2021-11-26 NOTE — Procedures (Signed)
° °  [facility]  HOME SLEEP TEST (Watch PAT) REPORT  STUDY DATE: 11/24/2021  DOB: [birth year]  MRN: [id number]  ORDERING CLINICIAN: Star Age, MD, PhD   REFERRING CLINICIAN: [name], MD   CLINICAL INFORMATION/HISTORY: 71 year old right-handed woman with an underlying medical history of allergic rhinitis, arthritis, chondromalacia, degenerative lumbar disc disease, fibromyalgia, reflux disease, hypertension, chronic kidney disease, peripheral neuropathy, thyroid disease, history of DVT, depression, and obesity, who was diagnosed with sleep apnea years ago and has been on autoBiPAP.  She presents for reevaluation.  Epworth sleepiness score: 17/24.  BMI: 33.3 kg/m  FINDINGS:   Sleep Summary:   Total Recording Time (hours, min): 9 hours, 53 minutes  Total Sleep Time (hours, min):  8 hours, 26 minutes   Percent REM (%):    1.5%   Respiratory Indices:   Calculated pAHI (per hour):  100.6/hour         REM pAHI:    n/a       NREM pAHI: 100.6/hour  Oxygen Saturation Statistics:    Oxygen Saturation (%) Mean: 92%   Minimum oxygen saturation (%):                 83%   O2 Saturation Range (%): 83-98%    O2 Saturation (minutes) <=88%: 11.2 min  Pulse Rate Statistics:   Pulse Mean (bpm):    84/min    Pulse Range (56-128/min)   IMPRESSION: OSA (obstructive sleep apnea), severe  RECOMMENDATION:  This home sleep test demonstrates severe obstructive sleep apnea with a total AHI of 100.6/hour and O2 nadir of 83%.  Snoring ranged from mild to moderate mostly, at times loud.  Ongoing treatment with positive airway pressure is highly recommended.  I will write for a new AutoPap machine.  She may benefit from a full night laboratory attended titration as she has been on BiPAP therapy and has had some central apneas.  Her central respiratory index was 18.5/h.  Alternative treatment options are limited secondary to the severity of the patient's sleep  disordered breathing.  Concomitant weight loss is recommended.  Please note, that untreated obstructive sleep apnea may carry additional perioperative morbidity. Patients with significant obstructive sleep apnea should receive perioperative PAP therapy and the surgeons and particularly the anesthesiologist should be informed of the diagnosis and the severity of the sleep disordered breathing. The patient should be cautioned not to drive, work at heights, or operate dangerous or heavy equipment when tired or sleepy. Review and reiteration of good sleep hygiene measures should be pursued with any patient. Other causes of the patient's symptoms, including circadian rhythm disturbances, an underlying mood disorder, medication effect and/or an underlying medical problem cannot be ruled out based on this test. Clinical correlation is recommended. The patient and her referring provider will be notified of the test results. The patient will be seen in follow up in sleep clinic at [facility].  I certify that I have reviewed the raw data recording prior to the issuance of this report in accordance with the standards of the American Academy of Sleep Medicine (AASM).   INTERPRETING PHYSICIAN:   Star Age, MD, PhD  Board Certified in Neurology and Sleep Medicine  [facility] [address], [place], [place] [address] [phone]

## 2021-11-26 NOTE — Addendum Note (Signed)
Addended by: Star Age on: 11/26/2021 04:12 PM   Modules accepted: Orders

## 2021-11-27 ENCOUNTER — Encounter: Payer: Self-pay | Admitting: *Deleted

## 2021-11-27 ENCOUNTER — Telehealth: Payer: Self-pay | Admitting: *Deleted

## 2021-11-27 NOTE — Telephone Encounter (Signed)
-----   Message from Star Age, MD sent at 11/26/2021  4:11 PM EST ----- Patient referred by her primary care for reevaluation of her obstructive sleep apnea.  She has an older machine, she has been on auto BiPAP therapy.  I saw her on 11/06/2021.  She had a home sleep test for reevaluation on 11/24/2021 which confirmed sleep apnea in the severe range.  Please advise patient that I would like to write for a new machine, called AutoPap.  She may need to come in for proper titration if AutoPap therapy is not adequate for treatment.  She may stay with her previous DME or switch to a new DME if desired. I have placed an order in the chart. Please send the order to a local DME, talk to patient, send report to referring MD. Please also reinforce the need for compliance with treatment. We will need a FU in sleep clinic for 10 weeks post-PAP set up, please arrange that with me or one of our NPs. Thanks,   Star Age, MD, PhD [facility] [place])

## 2021-11-27 NOTE — Telephone Encounter (Signed)
Spoke with patient and discussed the sleep study results as well as recommendations to change her to AutoPap machine.  Patient understands and is amenable.  She also understands she might end up needing to come in for proper titration if AutoPap therapy is not adequate for treatment.  The patient states she currently does not have a DME company.  She does not have a preference.  We discussed and will send to [place].  Patient aware she will receive a call by early next week to discuss the process and financial information.  Patient aware of insurance requirements which includes to use the machine at least 4 hours at night and also to be seen between 31 and 90 days.  We went ahead and scheduled the patient for a follow-up appointment Monday, May 8 at 1045 arrival 15 to 30 minutes early with  machine and power cord.  Patient's questions were answered and she verbalized appreciation for the call.  Letter mailed to patient.  Orders and supporting information faxed over to [place] for referral. Received a receipt of confirmation. Result sent to referring MD as well.

## 2021-12-08 ENCOUNTER — Telehealth: Payer: Self-pay

## 2021-12-08 MED ORDER — TOLTERODINE TARTRATE ER 4 MG PO CP24: 4.0000 mg | ORAL_CAPSULE | Freq: Every day | ORAL | 11 refills | Status: DC

## 2021-12-08 NOTE — Telephone Encounter (Signed)
Medication sent to pharmacy. Pt aware. ?

## 2021-12-15 DIAGNOSIS — M7581 Other shoulder lesions, right shoulder: Secondary | ICD-10-CM | POA: Insufficient documentation

## 2021-12-19 ENCOUNTER — Other Ambulatory Visit: Payer: Self-pay | Admitting: Surgery

## 2021-12-25 ENCOUNTER — Encounter
Admission: RE | Admit: 2021-12-25 | Discharge: 2021-12-25 | Disposition: A | Discharge status: Home / Self Care | Payer: Medicare Other | Source: Ambulatory Visit | Attending: Surgery | Admitting: Surgery

## 2021-12-25 NOTE — Patient Instructions (Addendum)
Your procedure is scheduled on: 01/06/22 - Tuesday ?Report to the Registration Desk on the 1st floor of the [facility]. ?To find out your arrival time, please call [phone] between 1PM - 3PM on: 01/05/22 - Monday ? ?REMEMBER: ?Instructions that are not followed completely may result in serious medical risk, up to and including death; or upon the discretion of your surgeon and anesthesiologist your surgery may need to be rescheduled. ? ?Do not eat food after midnight the night before surgery.  ?No gum chewing, lozengers or hard candies. ? ?You may however, drink CLEAR liquids up to 2 hours before you are scheduled to arrive for your surgery. Do not drink anything within 2 hours of your scheduled arrival time. ? ?Clear liquids include: ?- water  ?- apple juice without pulp ?- gatorade (not RED colors) ?- black coffee or tea (Do NOT add milk or creamers to the coffee or tea) ?Do NOT drink anything that is not on this list. ? ?In addition, your doctor has ordered for you to drink the provided  ?Ensure Pre-Surgery Clear Carbohydrate Drink  ?Drinking this carbohydrate drink up to two hours before surgery helps to reduce insulin resistance and improve patient outcomes. Please complete drinking 2 hours prior to scheduled arrival time. ? ?TAKE THESE MEDICATIONS THE MORNING OF SURGERY WITH A SIP OF WATER: ? ?- pantoprazole (PROTONIX) 40 MG tablet, (take one the night before and one on the morning of surgery - helps to prevent nausea after surgery.) ?- tolterodine (DETROL LA) 4 MG 24 hr capsule ?- venlafaxine XR (EFFEXOR-XR) 150 MG 24 hr capsule ?- venlafaxine XR (EFFEXOR-XR) 75 MG 24 hr capsule ?- oxyCODONE-acetaminophen (PERCOCET) 10-325 MG tablet ?- methocarbamol (ROBAXIN) 500 MG tablet ?- gabapentin (NEURONTIN) 800 MG tablet ?- atorvastatin (LIPITOR) 40 MG tablet ? ?One week prior to surgery: ?Stop Anti-inflammatories (NSAIDS) such as Advil, Aleve, Ibuprofen, Motrin, Naproxen, Naprosyn and Aspirin based products  such as Excedrin, Goodys Powder, BC Powder. ? ?Stop ANY OVER THE COUNTER supplements until after surgery. ? ?You may however, continue to take Tylenol if needed for pain up until the day of surgery. ? ?No Alcohol for 24 hours before or after surgery. ? ?No Smoking including e-cigarettes for 24 hours prior to surgery.  ?No chewable tobacco products for at least 6 hours prior to surgery.  ?No nicotine patches on the day of surgery. ? ?Do not use any "recreational" drugs for at least a week prior to your surgery.  ?Please be advised that the combination of cocaine and anesthesia may have negative outcomes, up to and including death. ?If you test positive for cocaine, your surgery will be cancelled. ? ?On the morning of surgery brush your teeth with toothpaste and water, you may rinse your mouth with mouthwash if you wish. ?Do not swallow any toothpaste or mouthwash. ? ?Use CHG Soap or wipes as directed on instruction sheet. ? ?Do not wear jewelry, make-up, hairpins, clips or nail polish. ? ?Do not wear lotions, powders, or perfumes.  ? ?Do not shave body from the neck down 48 hours prior to surgery just in case you cut yourself which could leave a site for infection.  ?Also, freshly shaved skin may become irritated if using the CHG soap. ? ?Contact lenses, hearing aids and dentures may not be worn into surgery. ? ?Do not bring valuables to the hospital. [facility] is not responsible for any missing/lost belongings or valuables.  ? ?Bring your C-PAP to the hospital with you in case you  may have to spend the night.  ? ?- Total Shoulder Arthroplasty:  use Benzolyl Peroxide 5% Gel as directed on instruction sheet. ? ?Notify your doctor if there is any change in your medical condition (cold, fever, infection). ? ?Wear comfortable clothing (specific to your surgery type) to the hospital. ? ?After surgery, you can help prevent lung complications by doing breathing exercises.  ?Take deep breaths and cough every 1-2 hours.  Your doctor may order a device called an Incentive Spirometer to help you take deep breaths. ?When coughing or sneezing, hold a pillow firmly against your incision with both hands. This is called ?splinting.? Doing this helps protect your incision. It also decreases belly discomfort. ? ?If you are being admitted to the hospital overnight, leave your suitcase in the car. ?After surgery it may be brought to your room. ? ?If you are being discharged the day of surgery, you will not be allowed to drive home. ?You will need a responsible adult (18 years or older) to drive you home and stay with you that night.  ? ?If you are taking public transportation, you will need to have a responsible adult (18 years or older) with you. ?Please confirm with your physician that it is acceptable to use public transportation.  ? ?Please call the [facility] Dept. at [phone] if you have any questions about these instructions. ? ?Surgery Visitation Policy: ? ?Patients undergoing a surgery or procedure may have two family members or support persons with them as long as the person is not COVID-19 positive or experiencing its symptoms.  ? ?Inpatient Visitation:   ? ?Visiting hours are 7 a.m. to 8 p.m. ?Up to four visitors are allowed at one time in a patient room, including children. The visitors may rotate out with other people during the day. One designated support person (adult) may remain overnight. ? ?All Areas: ?All visitors must pass COVID-19 screenings, use hand sanitizer when entering and exiting the patient?s room and wear a mask at all times, including in the patient?s room. ?Patients must also wear a mask when staff or their visitor are in the room. ?Masking is required regardless of vaccination status.  ?

## 2021-12-31 ENCOUNTER — Other Ambulatory Visit: Payer: Self-pay

## 2021-12-31 ENCOUNTER — Encounter
Admission: RE | Admit: 2021-12-31 | Discharge: 2021-12-31 | Disposition: A | Discharge status: Home / Self Care | Payer: Medicare Other | Source: Ambulatory Visit | Attending: Surgery | Admitting: Surgery

## 2021-12-31 ENCOUNTER — Other Ambulatory Visit: Payer: Self-pay | Admitting: Surgery

## 2021-12-31 VITALS — BP 102/54 | HR 88 | Resp 16 | Ht 66.0 in | Wt 207.5 lb

## 2021-12-31 DIAGNOSIS — Z01818 Encounter for other preprocedural examination: Secondary | ICD-10-CM | POA: Diagnosis not present

## 2021-12-31 HISTORY — DX: Chronic kidney disease, unspecified: N18.9

## 2021-12-31 HISTORY — DX: Other complications of anesthesia, initial encounter: T88.59XA

## 2021-12-31 HISTORY — DX: Cerebral aneurysm, nonruptured: I67.1

## 2021-12-31 HISTORY — DX: Cardiac arrhythmia, unspecified: I49.9

## 2021-12-31 HISTORY — DX: Other intervertebral disc degeneration, lumbar region without mention of lumbar back pain or lower extremity pain: M51.369

## 2021-12-31 HISTORY — DX: Other intervertebral disc degeneration, lumbar region: M51.36

## 2021-12-31 HISTORY — DX: Fibromyalgia: M79.7

## 2021-12-31 HISTORY — DX: Personal history of urinary calculi: Z87.442

## 2021-12-31 HISTORY — DX: Ulcerative (chronic) pancolitis without complications: K51.00

## 2021-12-31 HISTORY — DX: Chronic pain syndrome: G89.4

## 2021-12-31 HISTORY — DX: Prediabetes: R73.03

## 2021-12-31 HISTORY — DX: Restless legs syndrome: G25.81

## 2021-12-31 HISTORY — DX: Obesity, unspecified: E66.9

## 2021-12-31 LAB — TYPE AND SCREEN
ABO/RH(D): O POS
Antibody Screen: NEGATIVE

## 2021-12-31 LAB — URINALYSIS, ROUTINE W REFLEX MICROSCOPIC
Bacteria, UA: NONE SEEN
Bilirubin Urine: NEGATIVE
Glucose, UA: NEGATIVE mg/dL
Hgb urine dipstick: NEGATIVE
Ketones, ur: NEGATIVE mg/dL
Nitrite: NEGATIVE
Protein, ur: NEGATIVE mg/dL
Specific Gravity, Urine: 1.025 (ref 1.005–1.030)
pH: 5 (ref 5.0–8.0)

## 2021-12-31 LAB — CBC WITH DIFFERENTIAL/PLATELET
Abs Immature Granulocytes: 0.04 10*3/uL (ref 0.00–0.07)
Basophils Absolute: 0.1 10*3/uL (ref 0.0–0.1)
Basophils Relative: 1 %
Eosinophils Absolute: 0.2 10*3/uL (ref 0.0–0.5)
Eosinophils Relative: 3 %
HCT: 36.8 % (ref 36.0–46.0)
Hemoglobin: 10.9 g/dL — ABNORMAL LOW (ref 12.0–15.0)
Immature Granulocytes: 1 %
Lymphocytes Relative: 15 %
Lymphs Abs: 1.3 10*3/uL (ref 0.7–4.0)
MCH: 25.9 pg — ABNORMAL LOW (ref 26.0–34.0)
MCHC: 29.6 g/dL — ABNORMAL LOW (ref 30.0–36.0)
MCV: 87.4 fL (ref 80.0–100.0)
Monocytes Absolute: 0.5 10*3/uL (ref 0.1–1.0)
Monocytes Relative: 6 %
Neutro Abs: 6.7 10*3/uL (ref 1.7–7.7)
Neutrophils Relative %: 74 %
Platelets: 251 10*3/uL (ref 150–400)
RBC: 4.21 MIL/uL (ref 3.87–5.11)
RDW: 15.4 % (ref 11.5–15.5)
WBC: 8.8 10*3/uL (ref 4.0–10.5)
nRBC: 0 % (ref 0.0–0.2)

## 2021-12-31 LAB — COMPREHENSIVE METABOLIC PANEL
ALT: 8 U/L (ref 0–44)
AST: 15 U/L (ref 15–41)
Albumin: 3.8 g/dL (ref 3.5–5.0)
Alkaline Phosphatase: 88 U/L (ref 38–126)
Anion gap: 9 (ref 5–15)
BUN: 31 mg/dL — ABNORMAL HIGH (ref 8–23)
CO2: 28 mmol/L (ref 22–32)
Calcium: 9.1 mg/dL (ref 8.9–10.3)
Chloride: 103 mmol/L (ref 98–111)
Creatinine, Ser: 1.4 mg/dL — ABNORMAL HIGH (ref 0.44–1.00)
GFR, Estimated: 40 mL/min — ABNORMAL LOW (ref >60)
Glucose, Bld: 94 mg/dL (ref 70–99)
Potassium: 3.9 mmol/L (ref 3.5–5.1)
Sodium: 140 mmol/L (ref 135–145)
Total Bilirubin: 0.6 mg/dL (ref 0.3–1.2)
Total Protein: 7.2 g/dL (ref 6.5–8.1)

## 2021-12-31 LAB — SURGICAL PCR SCREEN
MRSA, PCR: NEGATIVE
Staphylococcus aureus: NEGATIVE

## 2021-12-31 NOTE — Pre-Procedure Instructions (Signed)
Abx was not ordered from Dr Roland Rack for upcoming reverse total shoulder replacement.  Called Tiffany at office and she added Ancef 2 gm iv to order set. She said Dr Roland Rack is in surgery now so it may take some time for him to sign order so that we will be able to see it on our end in PAT ?

## 2021-12-31 NOTE — Patient Instructions (Addendum)
Your procedure is scheduled on:01-06-22 Tuesday ?Report to the Registration Desk on the 1st floor of the [facility]. Then proceed to the 2nd floor Surgery Desk in the [facility] ?To find out your arrival time, please call [phone] between 1PM - 3PM on:01-05-22 Monday ? ?REMEMBER: ?Instructions that are not followed completely may result in serious medical risk, up to and including death; or upon the discretion of your surgeon and anesthesiologist your surgery may need to be rescheduled. ? ?Do not eat food after midnight the night before surgery.  ?No gum chewing, lozengers or hard candies. ? ?You may however, drink CLEAR liquids up to 2 hours before you are scheduled to arrive for your surgery. Do not drink anything within 2 hours of your scheduled arrival time. ? ?Clear liquids include: ?- water  ?- apple juice without pulp ?- gatorade (not RED colors) ?- black coffee or tea (Do NOT add milk or creamers to the coffee or tea) ?Do NOT drink anything that is not on this list. ? ?In addition, your doctor has ordered for you to drink the provided  ?Ensure Pre-Surgery Clear Carbohydrate Drink  ?Drinking this carbohydrate drink up to two hours before surgery helps to reduce insulin resistance and improve patient outcomes. Please complete drinking 2 hours prior to scheduled arrival time. ? ?TAKE THESE MEDICATIONS THE MORNING OF SURGERY WITH A SIP OF WATER: ?-atorvastatin (LIPITOR)  ?-gabapentin (NEURONTIN)  ?-methocarbamol (ROBAXIN)  ?-pantoprazole (PROTONIX) ?-tolterodine (DETROL LA)  ?-venlafaxine XR (EFFEXOR-XR) ?-oxyCODONE-acetaminophen (PERCOCET) ? ?One week prior to surgery: ?Stop Anti-inflammatories (NSAIDS) such as Advil, Aleve, Ibuprofen, Motrin, Naproxen, Naprosyn and Aspirin based products such as Excedrin, Goodys Powder, BC Powder.You may however, take Tylenol/Percocet if needed for pain up until the day of surgery. ? ?Stop ANY OVER THE COUNTER supplements/vitamins NOW (12-31-21) until after surgery  (Alpha-Lipoic Acid , VITAMIN D3) ? ?No Alcohol for 24 hours before or after surgery. ? ?No Smoking including e-cigarettes for 24 hours prior to surgery.  ?No chewable tobacco products for at least 6 hours prior to surgery.  ?No nicotine patches on the day of surgery. ? ?Do not use any "recreational" drugs for at least a week prior to your surgery.  ?Please be advised that the combination of cocaine and anesthesia may have negative outcomes, up to and including death. ?If you test positive for cocaine, your surgery will be cancelled. ? ?On the morning of surgery brush your teeth with toothpaste and water, you may rinse your mouth with mouthwash if you wish. ?Do not swallow any toothpaste or mouthwash. ? ?Use CHG Soap as directed on instruction sheet. ? ?Total Shoulder Arthroplasty:  use Benzolyl Peroxide 5% Gel as directed on instruction sheet. ? ?Do not wear jewelry, make-up, hairpins, clips or nail polish. ? ?Do not wear lotions, powders, or perfumes.  ? ?Do not shave body from the neck down 48 hours prior to surgery just in case you cut yourself which could leave a site for infection.  ?Also, freshly shaved skin may become irritated if using the CHG soap. ? ?Contact lenses, hearing aids and dentures may not be worn into surgery. ? ?Do not bring valuables to the hospital. [facility] is not responsible for any missing/lost belongings or valuables.  ? ?Bring your C-PAP to the hospital with you ? ?Notify your doctor if there is any change in your medical condition (cold, fever, infection). ? ?Wear comfortable clothing (specific to your surgery type) to the hospital. ? ?After surgery, you can help prevent lung  complications by doing breathing exercises.  ?Take deep breaths and cough every 1-2 hours. Your doctor may order a device called an Incentive Spirometer to help you take deep breaths. ?When coughing or sneezing, hold a pillow firmly against your incision with both hands. This is called ?splinting.? Doing this  helps protect your incision. It also decreases belly discomfort. ? ?If you are being admitted to the hospital overnight, leave your suitcase in the car. ?After surgery it may be brought to your room. ? ?If you are being discharged the day of surgery, you will not be allowed to drive home. ?You will need a responsible adult (18 years or older) to drive you home and stay with you that night.  ? ?If you are taking public transportation, you will need to have a responsible adult (18 years or older) with you. ?Please confirm with your physician that it is acceptable to use public transportation.  ? ?Please call the [facility] Dept. at [phone] if you have any questions about these instructions. ? ?Surgery Visitation Policy: ? ?Patients undergoing a surgery or procedure may have two family members or support persons with them as long as the person is not COVID-19 positive or experiencing its symptoms.  ? ?Inpatient Visitation:   ? ?Visiting hours are 7 a.m. to 8 p.m. ?Up to four visitors are allowed at one time in a patient room, including children. The visitors may rotate out with other people during the day. One designated support person (adult) may remain overnight.  ?

## 2022-01-06 ENCOUNTER — Ambulatory Visit: Payer: Medicare Other | Admitting: Certified Registered Nurse Anesthetist

## 2022-01-06 ENCOUNTER — Ambulatory Visit: Payer: Medicare Other

## 2022-01-06 ENCOUNTER — Encounter
Admission: RE | Disposition: A | Discharge status: Home / Self Care | Payer: Self-pay | Source: Home / Self Care | Attending: Surgery

## 2022-01-06 ENCOUNTER — Ambulatory Visit: Payer: Medicare Other | Admitting: Urgent Care

## 2022-01-06 ENCOUNTER — Other Ambulatory Visit: Payer: Self-pay

## 2022-01-06 ENCOUNTER — Encounter: Payer: Self-pay | Admitting: Surgery

## 2022-01-06 ENCOUNTER — Ambulatory Visit
Admission: RE | Admit: 2022-01-06 | Discharge: 2022-01-06 | Disposition: A | Discharge status: Home / Self Care | Payer: Medicare Other | Attending: Surgery | Admitting: Surgery

## 2022-01-06 DIAGNOSIS — I10 Essential (primary) hypertension: Secondary | ICD-10-CM | POA: Insufficient documentation

## 2022-01-06 DIAGNOSIS — Z86718 Personal history of other venous thrombosis and embolism: Secondary | ICD-10-CM | POA: Diagnosis not present

## 2022-01-06 DIAGNOSIS — F32A Depression, unspecified: Secondary | ICD-10-CM | POA: Insufficient documentation

## 2022-01-06 DIAGNOSIS — G2581 Restless legs syndrome: Secondary | ICD-10-CM | POA: Diagnosis not present

## 2022-01-06 DIAGNOSIS — M19011 Primary osteoarthritis, right shoulder: Secondary | ICD-10-CM | POA: Insufficient documentation

## 2022-01-06 DIAGNOSIS — Z6832 Body mass index (BMI) 32.0-32.9, adult: Secondary | ICD-10-CM | POA: Diagnosis not present

## 2022-01-06 DIAGNOSIS — F419 Anxiety disorder, unspecified: Secondary | ICD-10-CM | POA: Insufficient documentation

## 2022-01-06 DIAGNOSIS — K219 Gastro-esophageal reflux disease without esophagitis: Secondary | ICD-10-CM | POA: Insufficient documentation

## 2022-01-06 DIAGNOSIS — E785 Hyperlipidemia, unspecified: Secondary | ICD-10-CM | POA: Diagnosis not present

## 2022-01-06 DIAGNOSIS — M797 Fibromyalgia: Secondary | ICD-10-CM | POA: Diagnosis not present

## 2022-01-06 DIAGNOSIS — Z87891 Personal history of nicotine dependence: Secondary | ICD-10-CM | POA: Insufficient documentation

## 2022-01-06 DIAGNOSIS — Z79899 Other long term (current) drug therapy: Secondary | ICD-10-CM | POA: Diagnosis not present

## 2022-01-06 DIAGNOSIS — G4730 Sleep apnea, unspecified: Secondary | ICD-10-CM | POA: Diagnosis not present

## 2022-01-06 DIAGNOSIS — E669 Obesity, unspecified: Secondary | ICD-10-CM | POA: Diagnosis not present

## 2022-01-06 DIAGNOSIS — M778 Other enthesopathies, not elsewhere classified: Secondary | ICD-10-CM | POA: Diagnosis not present

## 2022-01-06 DIAGNOSIS — G894 Chronic pain syndrome: Secondary | ICD-10-CM | POA: Insufficient documentation

## 2022-01-06 HISTORY — PX: REVERSE SHOULDER ARTHROPLASTY: SHX5054

## 2022-01-06 IMAGING — DX DG SHOULDER 1V*R*
3 series · 4 of 4 positions shown · non-contrast
Comparison: None.

CLINICAL DATA: Postop right shoulder reverse arthroplasty

EXAM:
RIGHT SHOULDER - 1 VIEW

[shoulder ap]
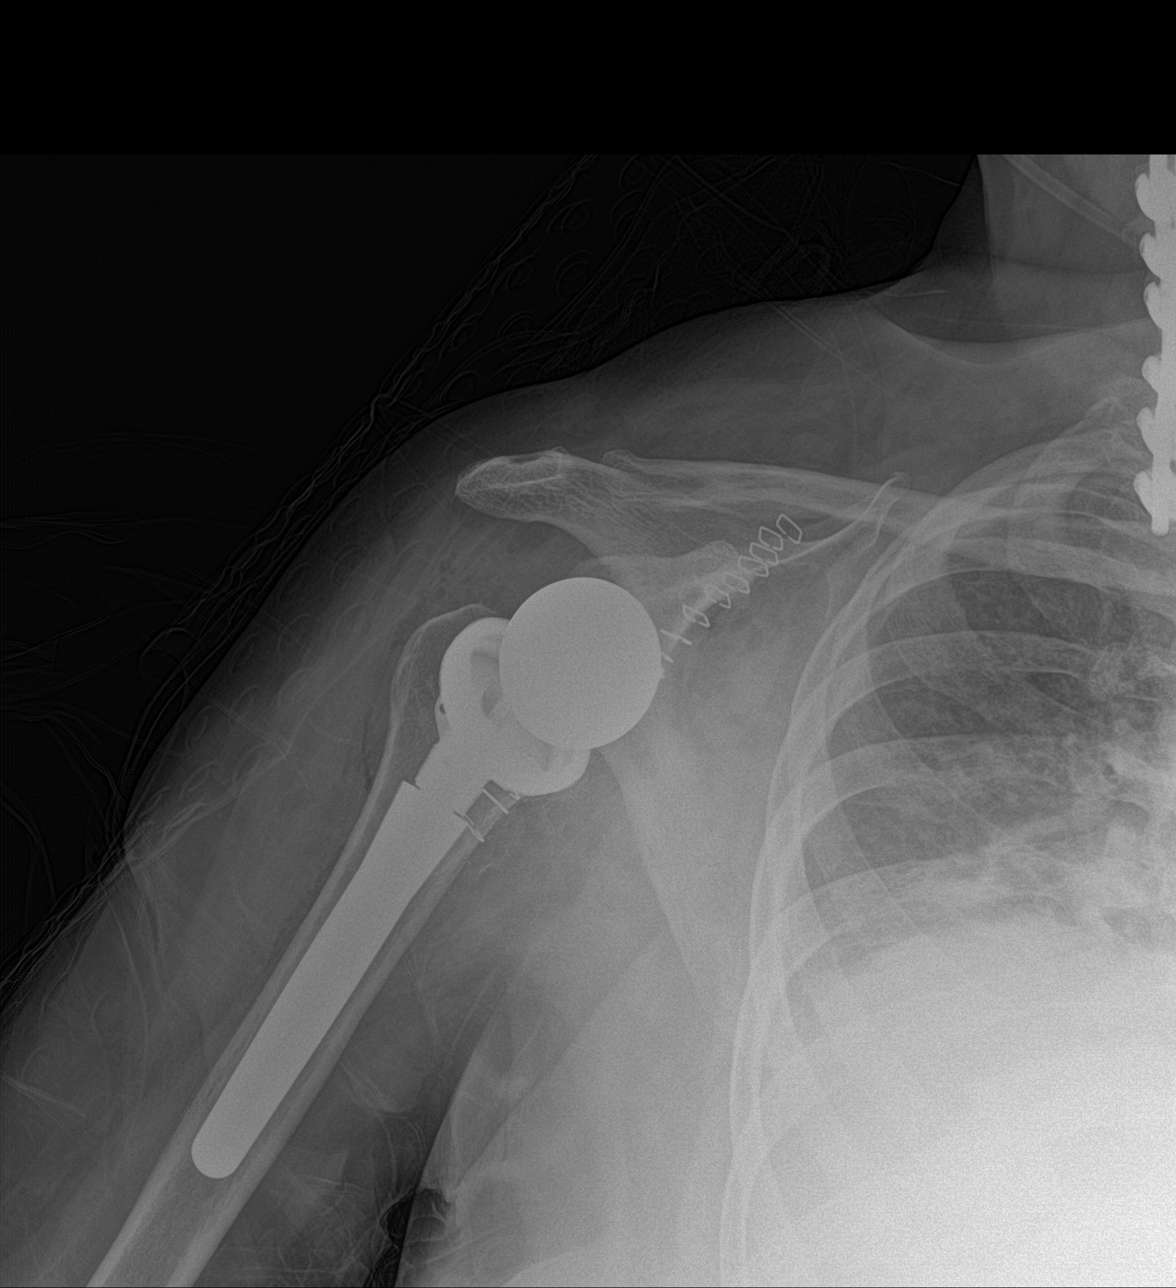

[shoulder obl (1 of 2)]
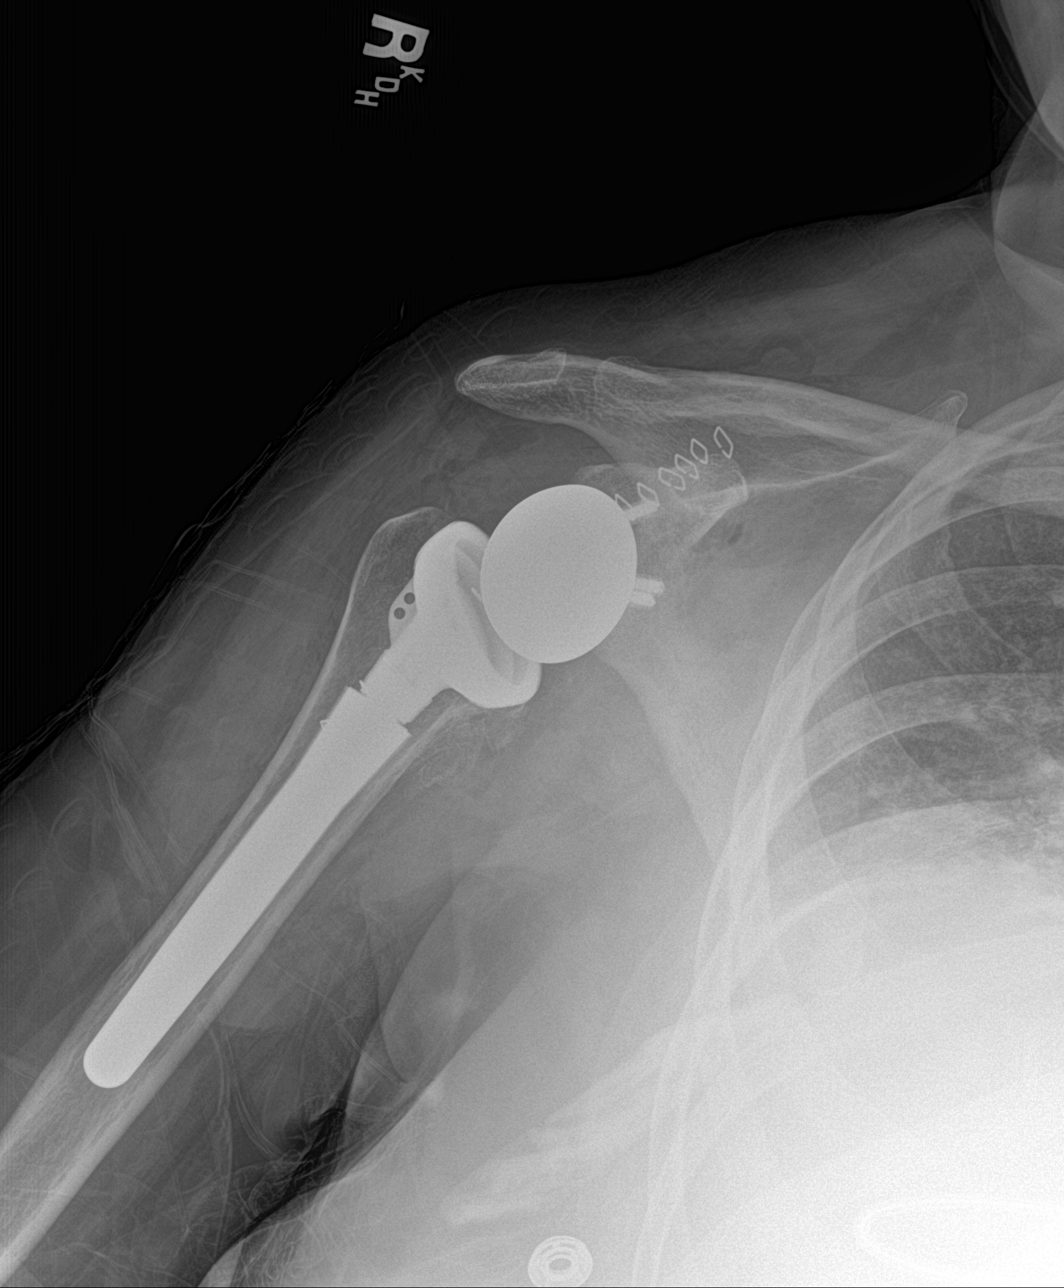

[Series 4: shoulder obl · 0.14mm/px · 2 of 2 slices shown (2 of 2)]
[im 1/2]
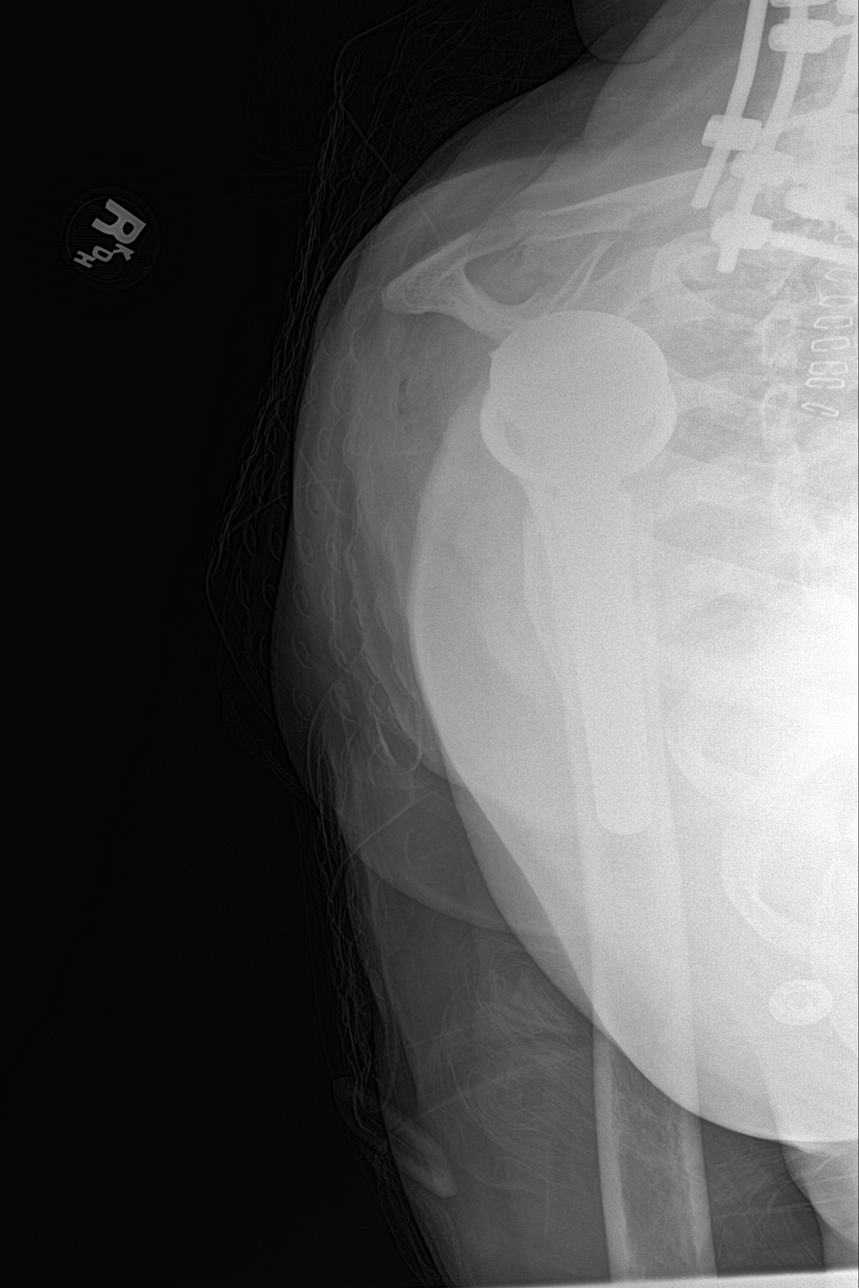
[im 2/2]
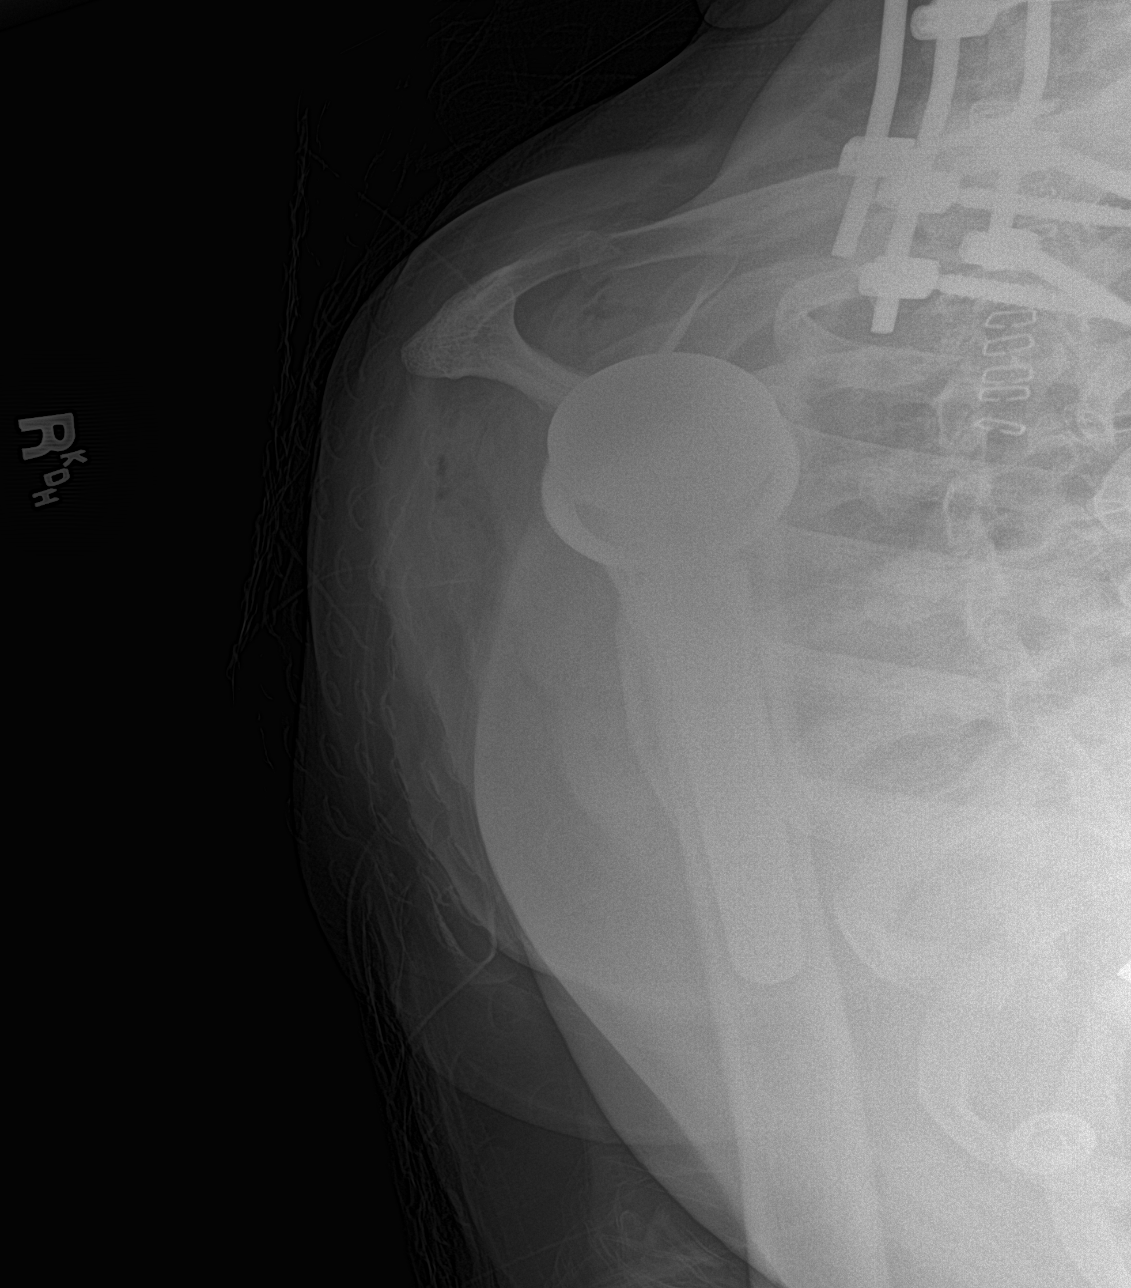

[4 of 4 positions shown; findings below may reference images not displayed]

FINDINGS: Right shoulder replacement in satisfactory position and alignment.
No fracture or complication.

Extensive posterior hardware fusion in the  cervical spine.
IMPRESSION: Satisfactory right shoulder replacement.

## 2022-01-06 SURGERY — ARTHROPLASTY, SHOULDER, TOTAL, REVERSE
Anesthesia: General | Site: Shoulder | Laterality: Right

## 2022-01-06 MED ORDER — TRANEXAMIC ACID 1000 MG/10ML IV SOLN
INTRAVENOUS | Status: DC | PRN
  Administered 2022-01-06: 1000 mg via TOPICAL

## 2022-01-06 MED ORDER — PROPOFOL 10 MG/ML IV BOLUS
INTRAVENOUS | Status: DC | PRN
  Administered 2022-01-06: 100 mg via INTRAVENOUS

## 2022-01-06 MED ORDER — ONDANSETRON 4 MG PO TBDP
ORAL_TABLET | ORAL | Status: AC
Start: 2022-01-06 — End: 2022-01-06
  Administered 2022-01-06: 4 mg
  Filled 2022-01-06: qty 1

## 2022-01-06 MED ORDER — LABETALOL HCL 5 MG/ML IV SOLN
INTRAVENOUS | Status: AC
  Filled 2022-01-06: qty 4

## 2022-01-06 MED ORDER — CEPHALEXIN 500 MG PO CAPS
ORAL_CAPSULE | ORAL | Status: AC
  Administered 2022-01-06: 1000 mg via ORAL
  Filled 2022-01-06: qty 2

## 2022-01-06 MED ORDER — BUPIVACAINE HCL (PF) 0.5 % IJ SOLN
INTRAMUSCULAR | Status: DC | PRN
  Administered 2022-01-06: 10 mL via PERINEURAL

## 2022-01-06 MED ORDER — FENTANYL CITRATE (PF) 100 MCG/2ML IJ SOLN
INTRAMUSCULAR | Status: DC | PRN
  Administered 2022-01-06 (×2): 50 ug via INTRAVENOUS

## 2022-01-06 MED ORDER — FENTANYL CITRATE (PF) 100 MCG/2ML IJ SOLN: 25.0000 ug | INTRAMUSCULAR | Status: DC | PRN

## 2022-01-06 MED ORDER — OXYCODONE HCL 5 MG/5ML PO SOLN: 5.0000 mg | Freq: Once | ORAL | Status: DC | PRN

## 2022-01-06 MED ORDER — PROPOFOL 10 MG/ML IV BOLUS
INTRAVENOUS | Status: AC
  Filled 2022-01-06: qty 40

## 2022-01-06 MED ORDER — SODIUM CHLORIDE 0.9 % IV SOLN: INTRAVENOUS | Status: DC

## 2022-01-06 MED ORDER — FENTANYL CITRATE (PF) 100 MCG/2ML IJ SOLN
INTRAMUSCULAR | Status: AC
  Filled 2022-01-06: qty 2

## 2022-01-06 MED ORDER — BUPIVACAINE-EPINEPHRINE (PF) 0.5% -1:200000 IJ SOLN
INTRAMUSCULAR | Status: DC | PRN
  Administered 2022-01-06: 30 mL

## 2022-01-06 MED ORDER — ONDANSETRON 4 MG PO TBDP: 4.0000 mg | ORAL_TABLET | Freq: Once | ORAL | Status: AC

## 2022-01-06 MED ORDER — ROCURONIUM BROMIDE 100 MG/10ML IV SOLN
INTRAVENOUS | Status: DC | PRN
  Administered 2022-01-06: 50 mg via INTRAVENOUS

## 2022-01-06 MED ORDER — TOLTERODINE TARTRATE ER 4 MG PO CP24: 4.0000 mg | ORAL_CAPSULE | ORAL | Status: DC

## 2022-01-06 MED ORDER — OXYCODONE HCL 5 MG PO TABS: 5.0000 mg | ORAL_TABLET | ORAL | Status: DC | PRN

## 2022-01-06 MED ORDER — LIDOCAINE HCL (CARDIAC) PF 100 MG/5ML IV SOSY
PREFILLED_SYRINGE | INTRAVENOUS | Status: DC | PRN
  Administered 2022-01-06: 80 mg via INTRAVENOUS

## 2022-01-06 MED ORDER — ACETAMINOPHEN 10 MG/ML IV SOLN
INTRAVENOUS | Status: AC
  Filled 2022-01-06: qty 100

## 2022-01-06 MED ORDER — ACETAMINOPHEN 500 MG PO TABS: 1000.0000 mg | ORAL_TABLET | Freq: Four times a day (QID) | ORAL | Status: DC

## 2022-01-06 MED ORDER — BUPIVACAINE LIPOSOME 1.3 % IJ SUSP
INTRAMUSCULAR | Status: AC
  Filled 2022-01-06: qty 10

## 2022-01-06 MED ORDER — MIDAZOLAM HCL 2 MG/2ML IJ SOLN: 1.0000 mg | INTRAMUSCULAR | Status: DC | PRN

## 2022-01-06 MED ORDER — FENTANYL CITRATE PF 50 MCG/ML IJ SOSY
PREFILLED_SYRINGE | INTRAMUSCULAR | Status: AC
  Administered 2022-01-06: 50 ug via INTRAVENOUS
  Filled 2022-01-06: qty 1

## 2022-01-06 MED ORDER — CHLORHEXIDINE GLUCONATE 0.12 % MT SOLN
OROMUCOSAL | Status: AC
Start: 2022-01-06 — End: 2022-01-06
  Administered 2022-01-06: 15 mL via OROMUCOSAL
  Filled 2022-01-06: qty 15

## 2022-01-06 MED ORDER — METOCLOPRAMIDE HCL 10 MG PO TABS: 5.0000 mg | ORAL_TABLET | Freq: Three times a day (TID) | ORAL | Status: DC | PRN

## 2022-01-06 MED ORDER — CEPHALEXIN 500 MG PO CAPS: 1000.0000 mg | ORAL_CAPSULE | Freq: Once | ORAL | Status: AC

## 2022-01-06 MED ORDER — ACETAMINOPHEN 10 MG/ML IV SOLN
INTRAVENOUS | Status: DC | PRN
  Administered 2022-01-06: 1000 mg via INTRAVENOUS

## 2022-01-06 MED ORDER — CEFAZOLIN SODIUM-DEXTROSE 2-4 GM/100ML-% IV SOLN
2.0000 g | Freq: Once | INTRAVENOUS | Status: AC
  Administered 2022-01-06: 2 g via INTRAVENOUS

## 2022-01-06 MED ORDER — TRANEXAMIC ACID 1000 MG/10ML IV SOLN
INTRAVENOUS | Status: AC
  Filled 2022-01-06: qty 10

## 2022-01-06 MED ORDER — ONDANSETRON HCL 4 MG/2ML IJ SOLN
INTRAMUSCULAR | Status: DC | PRN
  Administered 2022-01-06: 4 mg via INTRAVENOUS

## 2022-01-06 MED ORDER — DEXAMETHASONE SODIUM PHOSPHATE 10 MG/ML IJ SOLN
INTRAMUSCULAR | Status: DC | PRN
  Administered 2022-01-06: 4 mg via INTRAVENOUS

## 2022-01-06 MED ORDER — KETOROLAC TROMETHAMINE 15 MG/ML IJ SOLN
15.0000 mg | Freq: Once | INTRAMUSCULAR | Status: DC
Start: 2022-01-06 — End: 2022-01-06

## 2022-01-06 MED ORDER — PHENYLEPHRINE 40 MCG/ML (10ML) SYRINGE FOR IV PUSH (FOR BLOOD PRESSURE SUPPORT)
PREFILLED_SYRINGE | INTRAVENOUS | Status: DC | PRN
  Administered 2022-01-06: 40 ug via INTRAVENOUS

## 2022-01-06 MED ORDER — LACTATED RINGERS IV SOLN: INTRAVENOUS | Status: DC

## 2022-01-06 MED ORDER — ONDANSETRON HCL 4 MG/2ML IJ SOLN: 4.0000 mg | Freq: Once | INTRAMUSCULAR | Status: DC | PRN

## 2022-01-06 MED ORDER — CEFAZOLIN SODIUM-DEXTROSE 2-4 GM/100ML-% IV SOLN
INTRAVENOUS | Status: AC
  Filled 2022-01-06: qty 100

## 2022-01-06 MED ORDER — METOCLOPRAMIDE HCL 5 MG/ML IJ SOLN: 5.0000 mg | Freq: Three times a day (TID) | INTRAMUSCULAR | Status: DC | PRN

## 2022-01-06 MED ORDER — PHENYLEPHRINE HCL-NACL 20-0.9 MG/250ML-% IV SOLN
INTRAVENOUS | Status: DC | PRN
  Administered 2022-01-06: 15 ug/min via INTRAVENOUS

## 2022-01-06 MED ORDER — SODIUM CHLORIDE 0.9 % IR SOLN
Status: DC | PRN
  Administered 2022-01-06: 3000 mL

## 2022-01-06 MED ORDER — ACETAMINOPHEN 10 MG/ML IV SOLN: 1000.0000 mg | Freq: Once | INTRAVENOUS | Status: DC | PRN

## 2022-01-06 MED ORDER — BUPIVACAINE LIPOSOME 1.3 % IJ SUSP
INTRAMUSCULAR | Status: DC | PRN
  Administered 2022-01-06: 10 mL

## 2022-01-06 MED ORDER — OXYCODONE HCL 5 MG PO TABS: 5.0000 mg | ORAL_TABLET | ORAL | 0 refills | Status: DC | PRN

## 2022-01-06 MED ORDER — ONDANSETRON HCL 4 MG/2ML IJ SOLN: 4.0000 mg | Freq: Four times a day (QID) | INTRAMUSCULAR | Status: DC | PRN

## 2022-01-06 MED ORDER — KETOROLAC TROMETHAMINE 15 MG/ML IJ SOLN
INTRAMUSCULAR | Status: AC
  Filled 2022-01-06: qty 1

## 2022-01-06 MED ORDER — ARTIFICIAL TEARS OPHTHALMIC OINT
TOPICAL_OINTMENT | OPHTHALMIC | Status: AC
  Filled 2022-01-06: qty 3.5

## 2022-01-06 MED ORDER — LABETALOL HCL 5 MG/ML IV SOLN
INTRAVENOUS | Status: DC | PRN
  Administered 2022-01-06: 5 mg via INTRAVENOUS

## 2022-01-06 MED ORDER — ORAL CARE MOUTH RINSE: 15.0000 mL | Freq: Once | OROMUCOSAL | Status: AC

## 2022-01-06 MED ORDER — BUPIVACAINE HCL (PF) 0.5 % IJ SOLN
INTRAMUSCULAR | Status: AC
  Filled 2022-01-06: qty 10

## 2022-01-06 MED ORDER — ONDANSETRON HCL 4 MG PO TABS: 4.0000 mg | ORAL_TABLET | Freq: Four times a day (QID) | ORAL | Status: DC | PRN

## 2022-01-06 MED ORDER — MIDAZOLAM HCL 2 MG/2ML IJ SOLN
INTRAMUSCULAR | Status: AC
  Administered 2022-01-06: 1 mg via INTRAVENOUS
  Filled 2022-01-06: qty 2

## 2022-01-06 MED ORDER — BUPIVACAINE-EPINEPHRINE (PF) 0.5% -1:200000 IJ SOLN
INTRAMUSCULAR | Status: AC
  Filled 2022-01-06: qty 30

## 2022-01-06 MED ORDER — CHLORHEXIDINE GLUCONATE 0.12 % MT SOLN: 15.0000 mL | Freq: Once | OROMUCOSAL | Status: AC

## 2022-01-06 MED ORDER — OXYCODONE HCL 5 MG PO TABS: 5.0000 mg | ORAL_TABLET | Freq: Once | ORAL | Status: DC | PRN

## 2022-01-06 MED ORDER — CEFAZOLIN SODIUM-DEXTROSE 2-4 GM/100ML-% IV SOLN: 2.0000 g | Freq: Four times a day (QID) | INTRAVENOUS | Status: DC

## 2022-01-06 MED ORDER — SUGAMMADEX SODIUM 200 MG/2ML IV SOLN
INTRAVENOUS | Status: DC | PRN
  Administered 2022-01-06: 200 mg via INTRAVENOUS

## 2022-01-06 MED ORDER — ROCURONIUM BROMIDE 10 MG/ML (PF) SYRINGE
PREFILLED_SYRINGE | INTRAVENOUS | Status: AC
  Filled 2022-01-06: qty 10

## 2022-01-06 MED ORDER — FENTANYL CITRATE PF 50 MCG/ML IJ SOSY: 50.0000 ug | PREFILLED_SYRINGE | Freq: Once | INTRAMUSCULAR | Status: AC

## 2022-01-06 MED ORDER — BUPIVACAINE LIPOSOME 1.3 % IJ SUSP
INTRAMUSCULAR | Status: DC | PRN
  Administered 2022-01-06: 10 mL via PERINEURAL

## 2022-01-06 SURGICAL SUPPLY — 71 items
APL PRP STRL LF DISP 70% ISPRP (MISCELLANEOUS) ×2
BASEPLATE BOSS DRILL (MISCELLANEOUS) ×1 IMPLANT
BIT DRILL 2.5 (BIT) ×2
BIT DRILL 2.5X4.5XSCR (BIT) IMPLANT
BIT DRILL BASEPLATE CENTRAL S (BIT) ×1 IMPLANT
BIT DRL 2.5X4.5XSCR (BIT) ×1
BLADE SAW SAG 25X90X1.19 (BLADE) ×2 IMPLANT
BSPLAT GLND THK2 22.8X27.3 (Plate) ×1 IMPLANT
CHLORAPREP W/TINT 26 (MISCELLANEOUS) ×3 IMPLANT
COOLER POLAR GLACIER W/PUMP (MISCELLANEOUS) ×2 IMPLANT
COVER BACK TABLE REUSABLE LG (DRAPES) ×2 IMPLANT
DRAPE 3/4 80X56 (DRAPES) ×2 IMPLANT
DRAPE INCISE IOBAN 66X45 STRL (DRAPES) ×2 IMPLANT
DRAPE U-SHAPE 47X51 STRL (DRAPES) ×1 IMPLANT
DRSG OPSITE POSTOP 4X8 (GAUZE/BANDAGES/DRESSINGS) ×2 IMPLANT
ELECT BLADE 6.5 EXT (BLADE) IMPLANT
ELECT CAUTERY BLADE 6.4 (BLADE) ×2 IMPLANT
ELECT REM PT RETURN 9FT ADLT (ELECTROSURGICAL) ×2
ELECTRODE REM PT RTRN 9FT ADLT (ELECTROSURGICAL) ×1 IMPLANT
GAUZE XEROFORM 1X8 LF (GAUZE/BANDAGES/DRESSINGS) ×2 IMPLANT
GLENOSPHERE RSS 2 CONCENTRIC (Shoulder) ×1 IMPLANT
GLOVE SRG 8 PF TXTR STRL LF DI (GLOVE) ×2 IMPLANT
GLOVE SURG ENC MOIS LTX SZ7.5 (GLOVE) ×8 IMPLANT
GLOVE SURG ENC MOIS LTX SZ8 (GLOVE) ×8 IMPLANT
GLOVE SURG UNDER LTX SZ8 (GLOVE) ×2 IMPLANT
GLOVE SURG UNDER POLY LF SZ8 (GLOVE) ×4
GOWN STRL REUS W/ TWL LRG LVL3 (GOWN DISPOSABLE) ×1 IMPLANT
GOWN STRL REUS W/ TWL XL LVL3 (GOWN DISPOSABLE) ×1 IMPLANT
GOWN STRL REUS W/TWL LRG LVL3 (GOWN DISPOSABLE) ×2
GOWN STRL REUS W/TWL XL LVL3 (GOWN DISPOSABLE) ×2
GUIDE PIN 2.0 X 150 (WIRE) ×1 IMPLANT
HOOD PEEL AWAY FLYTE STAYCOOL (MISCELLANEOUS) ×7 IMPLANT
IV NS IRRIG 3000ML ARTHROMATIC (IV SOLUTION) ×2 IMPLANT
KIT SHOULDER TRACTION (DRAPES) IMPLANT
KIT STABILIZATION SHOULDER (MISCELLANEOUS) ×3 IMPLANT
KIT TURNOVER KIT A (KITS) ×2 IMPLANT
LINER STD +3S RSS HXL (Liner) ×1 IMPLANT
MANIFOLD NEPTUNE II (INSTRUMENTS) ×2 IMPLANT
MASK FACE SPIDER DISP (MASK) ×2 IMPLANT
MAT ABSORB  FLUID 56X50 GRAY (MISCELLANEOUS) ×1
MAT ABSORB FLUID 56X50 GRAY (MISCELLANEOUS) ×1 IMPLANT
NDL MAYO CATGUT SZ1 (NEEDLE) IMPLANT
NDL SAFETY ECLIPSE 18X1.5 (NEEDLE) ×1 IMPLANT
NDL SPNL 20GX3.5 QUINCKE YW (NEEDLE) ×1 IMPLANT
NEEDLE HYPO 18GX1.5 SHARP (NEEDLE) ×2
NEEDLE MAYO CATGUT SZ1 (NEEDLE) IMPLANT
NEEDLE SPNL 20GX3.5 QUINCKE YW (NEEDLE) ×2 IMPLANT
NS IRRIG 500ML POUR BTL (IV SOLUTION) ×2 IMPLANT
PACK ARTHROSCOPY SHOULDER (MISCELLANEOUS) ×2 IMPLANT
PAD ARMBOARD 7.5X6 YLW CONV (MISCELLANEOUS) ×2 IMPLANT
PAD WRAPON POLAR SHDR UNIV (MISCELLANEOUS) ×1 IMPLANT
PLATE BASE REVERSE RSS S (Plate) ×1 IMPLANT
PULSAVAC PLUS IRRIG FAN TIP (DISPOSABLE) ×2
SCREW 4.5X20 RSS W CAP (Screw) ×3 IMPLANT
SCREW 4.5X30 RSS W CAP (Screw) ×1 IMPLANT
SCREW BODY REVERSE STD (Screw) ×1 IMPLANT
SLING ULTRA II M (MISCELLANEOUS) ×1 IMPLANT
SPONGE T-LAP 18X18 ~~LOC~~+RFID (SPONGE) ×4 IMPLANT
STAPLER SKIN PROX 35W (STAPLE) ×2 IMPLANT
STEM PRES FIT SZ 12 TSS (Stem) ×1 IMPLANT
SUT ETHIBOND 0 MO6 C/R (SUTURE) ×2 IMPLANT
SUT FIBERWIRE #2 38 BLUE 1/2 (SUTURE) ×4
SUT VIC AB 0 CT1 36 (SUTURE) ×2 IMPLANT
SUT VIC AB 2-0 CT1 27 (SUTURE) ×4
SUT VIC AB 2-0 CT1 TAPERPNT 27 (SUTURE) ×2 IMPLANT
SUTURE FIBERWR #2 38 BLUE 1/2 (SUTURE) ×4 IMPLANT
SYR 10ML LL (SYRINGE) ×2 IMPLANT
SYR 30ML LL (SYRINGE) ×2 IMPLANT
TIP FAN IRRIG PULSAVAC PLUS (DISPOSABLE) ×1 IMPLANT
WATER STERILE IRR 500ML POUR (IV SOLUTION) ×2 IMPLANT
WRAPON POLAR PAD SHDR UNIV (MISCELLANEOUS) ×2

## 2022-01-06 NOTE — Anesthesia Procedure Notes (Signed)
Anesthesia Regional Block: Interscalene brachial plexus block  ? ?Pre-Anesthetic Checklist: , timeout performed,  Correct Patient, Correct Site, Correct Laterality,  Correct Procedure, Correct Position, site marked,  Risks and benefits discussed,  Surgical consent,  Pre-op evaluation,  At surgeon's request and post-op pain management ? ?Laterality: Right ? ?Prep: chloraprep     ?  ?Needles:  ?Injection technique: Single-shot ? ?Needle Type: Echogenic Needle   ? ? ?Needle Length: 4cm  ?Needle Gauge: 25  ? ? ? ?Additional Needles: ? ? ?Procedures:,,,, ultrasound used (permanent image in chart),,    ?Narrative:  ?Injection made incrementally with aspirations every 5 mL. ? ?Performed by: Personally  ?Anesthesiologist: Arita Miss, MD ? ?Additional Notes: ?Patient's chart reviewed and they were deemed appropriate candidate for procedure, at surgeon's request. Patient educated about risks, benefits, and alternatives of the block including but not limited to: temporary or permanent nerve damage, bleeding, infection, damage to surround tissues, pneumothorax, hemidiaphragmatic paralysis, unilateral Horner's syndrome, block failure, local anesthetic toxicity. Patient expressed understanding. A formal time-out was conducted consistent with institution rules. ? ?Monitors were applied, and minimal sedation used (see nursing record). The site was prepped with skin prep and allowed to dry, and sterile gloves were used. A high frequency linear ultrasound probe with probe cover was utilized throughout. C5-7 nerve roots located and appeared anatomically normal, local anesthetic injected around them, and echogenic block needle trajectory was monitored throughout. Aspiration performed every 59m. Lung and blood vessels were avoided. All injections were performed without resistance and free of blood and paresthesias. The patient tolerated the procedure well. ? ?Injectate: 161mexparel + 1033m.5% bupivacaine  ? ? ? ?

## 2022-01-06 NOTE — Op Note (Signed)
01/06/2022 ? ?12:46 PM ? ?Patient:   Lisa Good ? 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 ? ?Post-Op Diagnosis:   Same. ? ?Procedure:   Reverse right total shoulder arthroplasty with biceps tenodesis. ? ?Surgeon:   Pascal Lux, MD ? ?Assistant:   Cameron Proud, PA-C; Myra Rude, PA-S ? ?Anesthesia:   General endotracheal with an interscalene block using Exparel placed preoperatively by the anesthesiologist. ? ?Findings:   As above. ? ?Complications:   None ? ?EBL:   100 cc ? ?Fluids:   500 cc crystalloid ? ?UOP:   None ? ?TT:   None ? ?Drains:   None ? ?Closure:   Staples ? ?Implants:   All press-fit Integra system with a 12 mm stem, a standard metaphyseal body, a +3 mm humeral platform, a mini baseplate, and a 38 mm concentric +2 mm laterally offset glenosphere. ? ?Brief Clinical Note:   The patient is a 71 year old female with a long history of progressively worsening right shoulder pain and stiffness. Her symptoms have progressed despite medications, activity modification, etc. Her history and examination are consistent with advanced degenerative joint disease, confirmed by plain radiographs. The patient presents at this time for a reverse right total shoulder arthroplasty. ? ?Procedure:   The patient underwent placement of an interscalene block using Exparel by the anesthesiologist in the preoperative holding area before being brought into the operating room and lain in the supine position. The patient then underwent general endotracheal intubation and anesthesia before the patient was repositioned in the beach chair position using the beach chair positioner. The right shoulder and upper extremity were prepped with ChloraPrep solution before being draped sterilely. Preoperative antibiotics were administered.  ? ?A timeout was performed to verify the appropriate surgical site before a standard anterior approach to the  shoulder was made through an approximately 4-5 inch incision. The incision was carried down through the subcutaneous tissues to expose the deltopectoral fascia. The interval between the deltoid and pectoralis muscles was identified and this plane developed, retracting the cephalic vein laterally with the deltoid muscle. The conjoined tendon was identified. Its lateral margin was dissected and the Kolbel self-retraining retractor inserted. The "three sisters" were identified and cauterized. Bursal tissues were removed to improve visualization.  ? ?The biceps tendon was identified near the inferior aspect of the bicipital groove. A soft tissue tenodesis was performed by attaching the biceps tendon to the adjacent pectoralis major tendon using two #0 Ethibond interrupted sutures. The biceps tendon was then transected just proximal to the tenodesis site. The subscapularis tendon was released from its attachment to the lesser tuberosity 1 cm proximal to its insertion and several tagging sutures placed. The inferior capsule was released with care after identifying and protecting the axillary nerve. The proximal humeral cut was made at approximately 20? of retroversion using the extra-medullary guide.  ? ?Attention was redirected to the glenoid. The labrum was debrided circumferentially before the center of the glenoid was identified. The guidewire was drilled into the glenoid neck using the appropriate guide. After verifying its position, it was overreamed with the mini-baseplate reamer to create a flat surface before the stem reamer was utilized. The superior and inferior peg sites were reamed using the appropriate guide to complete the glenoid preparation. The permanent mini-baseplate was impacted into place. It was stabilized with a 20 x 4.5 mm central screw and three peripheral screws.  A locking cap was placed over the superior  screw. The permanent 38 mm concentric glenosphere with +2 mm of lateral offset was then  impacted into place and its Morse taper locking mechanism verified using manual distraction. ? ?Attention was directed to the humeral side. The humeral canal was prepared utilizing the tapered stem reamers sequentially beginning with the 7 mm stem and progressing to a 12 mm stem. This demonstrated a good tight fit. The metaphyseal region was then prepared using the appropriate planar device. The trial stem and standard metaphyseal body were put together on the back table and a trial reduction performed using the +0 mm and +3 mm inserts. With the +3 mm insert, the arm demonstrated excellent range of motion as the hand could be brought across the chest to the opposite shoulder and brought to the top of the patient's head and to the patient's ear. The shoulder appeared stable throughout this range of motion. The joint was dislocated and the trial components removed. The permanent 12 mm stem with the standard body was impacted into place with care taken to maintain the appropriate version. A repeat trial reduction with the +3 mm insert again demonstrated excellent stability with the findings as described above. Therefore, the shoulder was re-dislocated and, after inserting the locking screw to secure the body to the stem, the permanent +3 mm insert impacted into place. After verifying its locking mechanism, the shoulder was relocated using two finger pressure and again placed through a range of motion with the findings as described above. ? ?The wound was copiously irrigated with sterile saline solution using the jet lavage system before a total of 10 cc of Exparel and 30 cc of 0.5% Sensorcaine with epinephrine was injected into the pericapsular and peri-incisional tissues to help with postoperative analgesia. The subscapularis tendon was reapproximated using #2 FiberWire interrupted sutures. The deltopectoral interval was closed using #0 Vicryl interrupted sutures before the subcutaneous tissues were closed using 2-0  Vicryl interrupted sutures. The skin was closed using staples. Prior to closing the skin, 1 g of transexemic acid in 10 cc of normal saline was injected intra-articularly to help with postoperative bleeding. A sterile occlusive dressing was applied to the wound before the arm was placed into a shoulder immobilizer with an abduction pillow. A Polar Care system also was applied to the shoulder. The patient was then transferred back to a hospital bed before being awakened, extubated, and returned to the recovery room in satisfactory condition after tolerating the procedure well. ?

## 2022-01-06 NOTE — Anesthesia Preprocedure Evaluation (Signed)
Anesthesia Evaluation  ?Patient identified by MRN, date of birth, ID band ?Patient awake ? ? ? ?Reviewed: ?Allergy & Precautions, NPO status , Patient's Chart, lab work & pertinent test results ? ?History of Anesthesia Complications ?(+) PROLONGED EMERGENCE and history of anesthetic complications ? ?Airway ?Mallampati: II ? ?TM Distance: >3 FB ?Neck ROM: Full ? ? ? Dental ? ?(+) Teeth Intact, Implants ?  ?Pulmonary ?sleep apnea and Continuous Positive Airway Pressure Ventilation , neg COPD, Patient abstained from smoking.Not current smoker, former smoker,  ?  ?Pulmonary exam normal ?breath sounds clear to auscultation ? ? ? ? ? ? Cardiovascular ?Exercise Tolerance: Good ?METShypertension, (-) CAD and (-) Past MI (-) dysrhythmias  ?Rhythm:Regular Rate:Normal ?- Systolic murmurs ? ?  ?Neuro/Psych ?PSYCHIATRIC DISORDERS Anxiety Depression negative neurological ROS ?   ? GI/Hepatic ?PUD, GERD  Medicated and Controlled,(+)  ?  ? (-) substance abuse ? ,   ?Endo/Other  ?neg diabetesHypothyroidism  ? Renal/GU ?CRFRenal disease  ? ?  ?Musculoskeletal ? ?(+) Arthritis , Fibromyalgia -, narcotic dependent ? Abdominal ?  ?Peds ? Hematology ? ?(+) Blood dyscrasia, anemia ,   ?Anesthesia Other Findings ?Past Medical History: ?No date: Allergy ?    Comment:  seasonal ?No date: Arthritis ?No date: Cerebral aneurysm ?No date: Chronic kidney disease ?No date: Chronic pain syndrome ?No date: Complication of anesthesia ?    Comment:  trouble waking up after back surgery ?No date: DDD (degenerative disc disease), lumbar ?No date: Depression ?2002: DVT (deep venous thrombosis) ([place]) ?No date: Fibromyalgia ?No date: GERD (gastroesophageal reflux disease) ?No date: History of kidney stones ?No date: Hyperlipidemia ?No date: Hypertension ?11/01/2019: Iron deficiency anemia ?No date: Irregular heart beat ?No date: Neuromuscular disorder ([place]) ?    Comment:  peripheral neuropathy/feet ?No date: Obesity ?No  date: Pancolitis [facility]) ?No date: Pre-diabetes ?No date: RLS (restless legs syndrome) ?No date: Sleep apnea ?    Comment:  CPAP  ? Reproductive/Obstetrics ? ?  ? ? ? ? ? ? ? ? ? ? ? ? ? ?  ?  ? ? ? ? ? ? ? ?Anesthesia Physical ?Anesthesia Plan ? ?ASA: 3 ? ?Anesthesia Plan: General  ? ?Post-op Pain Management: Ofirmev IV (intra-op)* and Regional block*  ? ?Induction: Intravenous ? ?PONV Risk Score and Plan: 3 and Ondansetron, Dexamethasone and Midazolam ? ?Airway Management Planned: Oral ETT ? ?Additional Equipment: None ? ?Intra-op Plan:  ? ?Post-operative Plan: Extubation in OR ? ?Informed Consent: I have reviewed the patients History and Physical, chart, labs and discussed the procedure including the risks, benefits and alternatives for the proposed anesthesia with the patient or authorized representative who has indicated his/her understanding and acceptance.  ? ? ? ?Dental advisory given ? ?Plan Discussed with: CRNA and Surgeon ? ?Anesthesia Plan Comments: (Discussed risks of anesthesia with patient, including PONV, sore throat, lip/dental/eye damage. Rare risks discussed as well, such as cardiorespiratory and neurological sequelae, and allergic reactions. Discussed the role of CRNA in patient's perioperative care. Patient understands. ?Discussed r/b/a of interscalene block, including elective nature. Risks discussed: ?- Rare: bleeding, infection, nerve damage ?- shortness of breath from hemidiaphragmatic paralysis ?- unilateral horner's syndrome ?- poor/non-working blocks ?- reactions and toxicity to local anesthetic ?Patient understands and agrees. ?)  ? ? ? ? ? ? ?Anesthesia Quick Evaluation ? ?

## 2022-01-06 NOTE — Anesthesia Procedure Notes (Addendum)
Procedure Name: Intubation ?Date/Time: 01/06/2022 10:39 AM ?Performed by: Lowry Bowl, CRNA ?Pre-anesthesia Checklist: Patient identified, Emergency Drugs available, Suction available and Patient being monitored ?Patient Re-evaluated:Patient Re-evaluated prior to induction ?Oxygen Delivery Method: Circle system utilized ?Preoxygenation: Pre-oxygenation with 100% oxygen ?Induction Type: IV induction ?Ventilation: Mask ventilation without difficulty ?Laryngoscope Size: 3 and McGraph ?Grade View: Grade I ?Tube type: Oral ?Tube size: 6.5 mm ?Number of attempts: 1 ?Airway Equipment and Method: Stylet and Video-laryngoscopy ?Placement Confirmation: ETT inserted through vocal cords under direct vision, positive ETCO2 and breath sounds checked- equal and bilateral ?Secured at: 21 cm ?Tube secured with: Tape ?Dental Injury: Teeth and Oropharynx as per pre-operative assessment  ?Comments: Head and neck kept stable/midline throughout induction/intubation; Eye lubrication applied to eyes and eye tape applied prior to intubation.  ? ? ? ? ?

## 2022-01-06 NOTE — Anesthesia Postprocedure Evaluation (Signed)
Anesthesia Post Note ? ?Patient: Lisa Good ? ?Procedure(s) Performed: REVERSE SHOULDER ARTHROPLASTY (Right: Shoulder) ? ?Patient location during evaluation: Endoscopy ?Anesthesia Type: General ?Level of consciousness: awake and alert ?Pain management: pain level controlled ?Vital Signs Assessment: post-procedure vital signs reviewed and stable ?Respiratory status: spontaneous breathing, nonlabored ventilation and respiratory function stable ?Cardiovascular status: blood pressure returned to baseline and stable ?Postop Assessment: no apparent nausea or vomiting ?Anesthetic complications: no ? ? ?No notable events documented. ? ? ?Last Vitals:  ?Vitals:  ? 01/06/22 1330 01/06/22 1345  ?BP: (!) 99/48 113/67  ?Pulse: 83 91  ?Resp: 10 (!) 22  ?Temp:    ?SpO2: 94% 90%  ?  ?Last Pain:  ?Vitals:  ? 01/06/22 1345  ?TempSrc:   ?PainSc: 0-No pain  ? ? ?  ?  ?  ?  ?  ?  ? ?[place] Ouch ? ? ? ? ?

## 2022-01-06 NOTE — Transfer of Care (Signed)
Immediate Anesthesia Transfer of Care Note ? ?Patient: Lisa Good ? ?Procedure(s) Performed: REVERSE SHOULDER ARTHROPLASTY (Right: Shoulder) ? ?Patient Location: PACU ? ?Anesthesia Type:General ? ?Level of Consciousness: awake, drowsy and patient cooperative ? ?Airway & Oxygen Therapy: Patient Spontanous Breathing and Patient connected to face mask oxygen ? ?Post-op Assessment: Report given to RN and Post -op Vital signs reviewed and stable ? ?Post vital signs: Reviewed and stable ? ?Last Vitals:  ?Vitals Value Taken Time  ?BP 124/36 01/06/22 1315  ?Temp    ?Pulse 79 01/06/22 1316  ?Resp 14 01/06/22 1316  ?SpO2 94 % 01/06/22 1316  ?Vitals shown include unvalidated device data. ? ?Last Pain:  ?Vitals:  ? 01/06/22 0838  ?TempSrc: Temporal  ?PainSc: 7   ?   ? ?  ? ?Complications: No notable events documented. ?

## 2022-01-06 NOTE — Discharge Instructions (Addendum)
Orthopedic discharge instructions: ?May shower with intact OpSite dressing once nerve block has worn off. ?Apply ice frequently to shoulder or use Polar Care device. ?Take ibuprofen 600-800 mg TID with meals for 5-7 days, then as necessary. (Toradol given in OR. Can have ibuprofen at 7pm) ?Take oxycodone as prescribed when needed.  ?May supplement with ES Tylenol if necessary. (Tylenol given in OR. Next dose due at 4pm). ?Keep shoulder immobilizer on at all times except may remove for bathing purposes. ?Follow-up in 10-14 days or as scheduled.AMBULATORY SURGERY  ?DISCHARGE INSTRUCTIONS ? ?DO NOT REMOVE TEAL GREEN ARMBAND FOR 4 DAYS. YOU RECEIVED A LOCAL ANESTHETIC AND SHOULD NOT HAVE ANY OTHER LOCAL ANESTHETIC FOR THE NEXT 4 DAYS. ? ? ?The drugs that you were given will stay in your system until tomorrow so for the next 24 hours you should not: ? ?Drive an automobile ?Make any legal decisions ?Drink any alcoholic beverage ? ? ?You may resume regular meals tomorrow.  Today it is better to start with liquids and gradually work up to solid foods. ? ?You may eat anything you prefer, but it is better to start with liquids, then soup and crackers, and gradually work up to solid foods. ? ? ?Please notify your doctor immediately if you have any unusual bleeding, trouble breathing, redness and pain at the surgery site, drainage, fever, or pain not relieved by medication. ? ? ? ?Additional Instructions: ? ? ?Please contact your physician with any problems or Same Day Surgery at [phone], Monday through Friday 6 am to 4 pm, or [place] at [facility] number at [phone].  ? ?POLAR CARE INFORMATION ? ?[email or web address] ? ?How to use [place]?  YouTube   [email or web address] ? ?OPERATING INSTRUCTIONS ? ?Start the product ?With dry hands, connect the transformer to the electrical connection located on the top of the cooler. Next, plug the transformer into an  appropriate electrical outlet. The unit will automatically start running at this point. ? ?To stop the pump, disconnect electrical power.  ?Unplug to stop the product when not in use. Unplugging the Polar Care unit turns it off. Always unplug immediately after use. Never leave it plugged in while unattended. Remove pad.  ?  ?FIRST ADD WATER TO FILL LINE, THEN ICE---Replace ice when existing ice is almost melted ? ?1 Discuss Treatment with your Licensed Health Care Practitioner and Use Only as Prescribed ?2 Apply Insulation Barrier & Cold Therapy Pad ?3 Check for Moisture ?4 Inspect Skin Regularly ? ?Tips and Armed forces technical officer Tips ?1. Use cubed or chunked ice for optimal performance. ?2. It is recommended to drain the Pad between uses. To drain the pad, hold the Pad upright with the hose pointed toward the ground. Depress the black plunger and allow water to drain out. ?3. You may disconnect the Pad from the unit without removing the pad from the affected area by depressing the silver tabs on the hose coupling and gently pulling the hoses apart. The Pad and unit will seal itself and will not leak. Note: Some dripping during release is normal. ?4. DO NOT RUN PUMP WITHOUT WATER! The pump in this unit is designed to run with water. Running the unit without water will cause permanent damage to the pump. ?5. Unplug unit before removing lid. ? ?TROUBLESHOOTING GUIDE ?Pump not running, Water not flowing to the pad, Pad is not getting cold ?1. Make sure the transformer is plugged into the wall outlet. ?2. Confirm that the ice  and water are filled to the indicated levels. ?3. Make sure there are no kinks in the pad. ?4. Gently pull on the blue tube to make sure the tube/pad junction is straight. ?5. Remove the pad from the treatment site and ll it while the pad is lying at; then reapply. ?6. Confirm that the pad couplings are securely attached to the unit. Listen for the double clicks (Figure 1) to confirm the pad  couplings are securely attached. ? ?Leaks    Note: Some condensation on the lines, controller, and pads is unavoidable, especially in warmer climates. ?1. If using a [place] Polar Care Cold Therapy unit with a detachable Cold Therapy Pad, and a leak exists (other than condensation on the lines) disconnect the pad couplings. Make sure the silver tabs on the couplings are depressed before reconnecting the pad to the pump hose; then confirm both sides of the coupling are properly clicked in. ?2. If the coupling continues to leak or a leak is detected in the pad itself, stop using it and call [place] at [phone]. ? ?Cleaning ?After use, empty and dry the unit with a soft cloth. Warm water and mild detergent may be used occasionally to clean the pump and tubes. ? ?WARNING: The [place] can be cold enough to cause serious injury, including full skin necrosis. Follow these Operating Instructions, and carefully read the Product Insert (see pouch on side of unit) and the Cold Therapy Pad Fitting Instructions (provided with each Cold Therapy Pad) prior to use. ? ? ?SHOULDER SLING IMMOBILIZER  ? ?VIDEO ?Slingshot 2 Shoulder Brace Application - YouTube ---[email or web address] ? ?INSTRUCTIONS ?While supporting the injured arm, slide the forearm into the sling. Wrap the adjustable shoulder strap around the neck and shoulders and attach the strap end to the sling using  the ?alligator strap tab.?  ?Adjust the shoulder strap to the required length. ?Position the shoulder pad behind the neck. ?To secure the shoulder pad location (optional), pull the shoulder strap away from the shoulder pad, unfold the hook material on the top of the pad, then press the shoulder strap back onto the hook material to secure the pad in place. ?Attach the closure strap across the open top of the sling. Position the strap so that it holds the arm securely in the sling. Next, attach the thumb strap to the open  end of the sling between the thumb and fingers. ?After sling has been fit, it may be easily removed and reapplied using the quick release buckle on shoulder strap. ?If a neutral pillow or 15? abduction pillow is included, place the pillow at the waistline. Attach the sling to the pillow, lining up hook material on the pillow with the loop on sling. Adjust the waist strap to fit.  ?If waist strap is too long, cut it to fit. Use the small piece of double sided hook material (located on top of the pillow) to secure the strap end. Place the double sided hook material on the inside of the cut strap end and secure it to the waist strap.     ?If no pillow is included, attach the waist strap to the sling and adjust to fit.   ? ?Washing Instructions: Straps and sling must be removed and cleaned regularly depending on your activity level and perspiration. Hand wash straps and sling in cold water with mild detergent, rinse, air dry ? ? ?  ? ? ? ?   ?

## 2022-01-06 NOTE — Evaluation (Signed)
Occupational Therapy Evaluation ?Patient Details ?Name: Lisa Good ?MRN: [id number] ?DOB: Sep 14, 1951 ?Today's Date: 01/06/2022 ? ? ?History of Present Illness 71yo female s/p R reverse TSA on 01/06/22. PMHx includes cerebral aneurysm, CKD, chronic pain syndrome, DDD (lumbar), depression, fibromyalgia, GERD, HLD, HTN, peripheral neuropathy, RLS, and sleep apnea.  ? ?Clinical Impression ?  ?Patient was seen for an OT evaluation this date. Pt lives with her spouse in a 1 story home with 3 steps. Prior to surgery, pt was independent, but more recently limited by R shoulder pain. Pt has orders for RUE to be immobilized and will be NWBing per MD. Patient presents with impaired strength/ROM, pain, and sensation to RUE with block not completely resolved yet. These impairments result in a decreased ability to perform self care tasks requiring MIN-MOD assist for UB/LB dressing, bathing, and grooming, and MAX assist for application of polar care, compression stockings, and sling/immobilizer. Pt/spouse instructed in polar care mgt, sling/immobilizer mgt, RUE precautions, adaptive strategies for bathing/dressing/toileting/grooming, positioning and considerations for sleep, pet care considerations, and home/routines modifications to maximize falls prevention, safety, and independence. Handout provided. Spouse demo's ability to don sling and polar care with PRN VC from OT after initial instruction. OT adjusted sling/immobilizer and polar care to improve comfort, optimize positioning, and to maximize skin integrity/safety. Pt/spouse verbalized understanding of all education/training provided. Pt will benefit from skilled therapy services as arranged by the surgeon to address these limitations and improve independence in daily tasks through recovery.      ? ?Recommendations for follow up therapy are one component of a multi-disciplinary discharge planning process, led by the attending physician.  Recommendations may be updated based on  patient status, additional functional criteria and insurance authorization.  ? ?Follow Up Recommendations ? Follow physician's recommendations for discharge plan and follow up therapies  ?  ?Assistance Recommended at Discharge Intermittent Supervision/Assistance  ?Patient can return home with the following A lot of help with bathing/dressing/bathroom;Assistance with cooking/housework;Assist for transportation;Help with stairs or ramp for entrance ? ?  ?Functional Status Assessment ? Patient has had a recent decline in their functional status and demonstrates the ability to make significant improvements in function in a reasonable and predictable amount of time.  ?Equipment Recommendations ? None recommended by OT  ?  ?Recommendations for Other Services   ? ? ?  ?Precautions / Restrictions Precautions ?Precautions: Shoulder;Fall ?Type of Shoulder Precautions: s/p rev TSA ?Shoulder Interventions: Shoulder sling/immobilizer;Shoulder abduction pillow;Off for dressing/bathing/exercises;At all times ?Precaution Booklet Issued: Yes (comment) ?Restrictions ?Weight Bearing Restrictions: Yes ?RUE Weight Bearing: Non weight bearing  ? ?  ? ?Mobility Bed Mobility ?  ?  ?  ?  ?  ?  ?  ?General bed mobility comments: NT, up in a recliner ?  ? ?Transfers ?Overall transfer level: Needs assistance ?Equipment used: None ?Transfers: Sit to/from Stand ?Sit to Stand: Supervision ?  ?  ?  ?  ?  ?  ?  ? ?  ?Balance Overall balance assessment: Mild deficits observed, not formally tested ?  ?  ?  ?  ?  ?  ?  ?  ?  ?  ?  ?  ?  ?  ?  ?  ?  ?  ?   ? ?ADL either performed or assessed with clinical judgement  ? ?ADL Overall ADL's : Needs assistance/impaired ?  ?  ?  ?  ?  ?  ?  ?  ?  ?  ?  ?  ?  ?  ?  ?  ?  ?  ?  ?  General ADL Comments: Pt requires MIN-MOD A for LB/UB dressing, bathing, and grooming tasks 2/2 R shoulder limitations and immobilization. Spouse able to provide needed level of assist.  ? ? ? ?Vision   ?   ?   ?Perception   ?  ?Praxis    ?  ? ?Pertinent Vitals/Pain Pain Assessment ?Pain Assessment: No/denies pain  ? ? ? ?Hand Dominance Right ?  ?Extremity/Trunk Assessment Upper Extremity Assessment ?Upper Extremity Assessment: RUE deficits/detail ?RUE Deficits / Details: s/p rev TSA, decreased sensation still, good grip strength ?RUE Sensation: decreased light touch;decreased proprioception ?RUE Coordination: decreased gross motor;decreased fine motor ?  ?Lower Extremity Assessment ?Lower Extremity Assessment: Overall WFL for tasks assessed ?  ?  ?  ?Communication Communication ?Communication: No difficulties ?  ?Cognition Arousal/Alertness: Awake/alert ?Behavior During Therapy: [facility] for tasks assessed/performed ?Overall Cognitive Status: Within Functional Limits for tasks assessed ?  ?  ?  ?  ?  ?  ?  ?  ?  ?  ?  ?  ?  ?  ?  ?  ?General Comments: a bit groggy ?  ?  ?General Comments    ? ?  ?Exercises Other Exercises ?Other Exercises: Pt/spouse instructed in strategies for bathing, dressing, grooming, falls prevention, extensive instruction in polar care and shoulder sling/immobilizer mgt (donning, doffing, positioning), shoulder precautions, home/routines modifications, and pet care considerations. Handout provided ?  ?Shoulder Instructions    ? ? ?Home Living Family/patient expects to be discharged to:: Private residence ?Living Arrangements: Spouse/significant other ?Available Help at Discharge: Family;Available 24 hours/day ?Type of Home: House ?Home Access: Stairs to enter ?Entrance Stairs-Number of Steps: 3 ?Entrance Stairs-Rails: Left ?Home Layout: One level ?  ?  ?Bathroom Shower/Tub: Walk-in shower ?  ?Bathroom Toilet: Handicapped height ?  ?  ?Home Equipment: Shower seat - built in;Rollator (4 wheels);Cane - single point;BSC/3in1;Wheelchair - manual ?  ?  ?  ? ?  ?Prior Functioning/Environment Prior Level of Function : Independent/Modified Independent;Driving ?  ?  ?  ?  ?  ?  ?Mobility Comments: Pt sometimes uses a SPC For longer  community distances, otherwise no AD ?ADLs Comments: Generally independent with ADL, IADL however more recently limited due to R shoulder pain ?  ? ?  ?  ?OT Problem List: Decreased strength;Decreased coordination;Decreased range of motion;Impaired sensation;Decreased knowledge of precautions;Impaired UE functional use ?  ?   ?OT Treatment/Interventions:    ?  ?OT Goals(Current goals can be found in the care plan section) Acute Rehab OT Goals ?Patient Stated Goal: go home and recover ?OT Goal Formulation: All assessment and education complete, DC therapy  ?OT Frequency:   ?  ? ?Co-evaluation   ?  ?  ?  ?  ? ?  ?AM-PAC OT "6 Clicks" Daily Activity     ?Outcome Measure Help from another person eating meals?: A Little ?Help from another person taking care of personal grooming?: A Little ?Help from another person toileting, which includes using toliet, bedpan, or urinal?: A Little ?Help from another person bathing (including washing, rinsing, drying)?: A Lot ?Help from another person to put on and taking off regular upper body clothing?: A Lot ?Help from another person to put on and taking off regular lower body clothing?: A Lot ?6 Click Score: 15 ?  ?End of Session   ? ?Activity Tolerance: Patient tolerated treatment well ?Patient left: in chair;with nursing/sitter in room;with family/visitor present ? ?OT Visit Diagnosis: Other abnormalities of gait and mobility (R26.89)  ?              ?  Time: 8682-5749 ?OT Time Calculation (min): 46 min ?Charges:  OT General Charges ?$OT Visit: 1 Visit ?OT Evaluation ?$OT Eval Moderate Complexity: 1 Mod ?OT Treatments ?$Self Care/Home Management : 23-37 mins ? ?Ardeth Perfect., MPH, MS, OTR/L ?ascom [phone] ?01/06/22, 4:38 PM ?

## 2022-01-06 NOTE — H&P (Signed)
History of Present Illness:  ?Lisa Good is a 71 y.o. female who presents today as a result of a referral from [place], [place], for right shoulder pain and stiffness.  ? ?The patient's symptoms began several years ago and developed without any specific cause or injury. Her symptoms have worsened over the past few months, causing her to seek further evaluation and treatment. She saw Cameron Proud, PA-C, 2 months ago who diagnosed her with degenerative joint disease. The patient was offered but declined a steroid injection at that time. She was sent for CT scan of the right shoulder and referred to me for discussion of possible surgical intervention. The patient rates her pain at 7/10 on today's visit. She has been taking Percocet 10/325 4 times daily for other medical issues, but finds that these medications help her shoulder as well. She also takes Advil, baclofen, and methocarbamol with limited benefit. The patient describes the symptoms as marked (major pain with significant limitations) and have the quality of being aching, miserable, nagging, stabbing, tender and throbbing. The pain is localized to the lateral arm/shoulder. These symptoms are aggravated constantly, with normal daily activities, with sleeping, at higher levels of activity, with overhead activity, reaching behind the back and getting dressed. She has tried non-steroidal anti-inflammatories (Advil), narcotics and muscle relaxants with limited benefit. She has tried rest with no significant benefit. She has not tried any physical therapy or steroid injections for the symptoms. The patient denies any neck pain, nor does she note any numbness or paresthesias down her arm to her hand. This complaint is not work related. She is a sports non-participant. ? ?Shoulder Surgical History:  ?The patient has had no shoulder surgery in the past. ? ?PMH/PSH/Family History/Social History/Meds/Allergies:  ?I have reviewed past medical, surgical, social and  family history, medications and allergies as documented in the EMR. ? ?Current Outpatient Medications: ? alpha lipoic acid 100 mg Cap Take 300 mg by mouth 2 (two) times daily  ? amLODIPine (NORVASC) 10 MG tablet TAKE 1 TABLET EVERY DAY 90 tablet 1  ? ARIPiprazole (ABILIFY) 5 MG tablet TAKE 1 TABLET BY MOUTH EVERY DAY 30 tablet 2  ? atorvastatin (LIPITOR) 40 MG tablet TAKE 1 TABLET BY MOUTH EVERY DAY 90 tablet 1  ? gabapentin (NEURONTIN) 800 MG tablet Take 1 tablet (800 mg total) by mouth 4 (four) times daily 360 tablet 1  ? ibuprofen (MOTRIN) 200 MG tablet Take 2 tablets (400 mg total) by mouth every 8 (eight) hours as needed for Pain  ? lisinopriL (ZESTRIL) 40 MG tablet TAKE 1 TABLET NIGHTLY 90 tablet 1  ? methocarbamoL (ROBAXIN) 500 MG tablet Take 500 mg by mouth 3 (three) times daily as needed  ? nystatin (MYCOSTATIN) 100,000 unit/mL suspension Swish and swallow 5 mLs 4 (four) times daily as needed  ? oxyCODONE-acetaminophen (PERCOCET) 10-325 mg tablet Take 1 tablet by mouth every 6 (six) hours as needed for Pain  ? pantoprazole (PROTONIX) 40 MG DR tablet TAKE 1 TABLET EVERY DAY 90 tablet 1  ? SENNA PLUS 8.6-50 mg tablet Take 1 tablet by mouth once daily  ? solifenacin (VESICARE) 5 MG tablet Take 1 tablet (5 mg total) by mouth once daily  ? tolterodine (DETROL LA) 4 MG LA capsule Take 4 mg by mouth once daily  ? traZODone (DESYREL) 50 MG tablet Take 1 tablet (50 mg total) by mouth at bedtime 30 tablet 5  ? venlafaxine (EFFEXOR-XR) 150 MG XR capsule Take 1 capsule (150 mg total) by  mouth once daily 90 capsule 1  ? venlafaxine (EFFEXOR-XR) 75 MG XR capsule TAKE 1 CAPSULE BY MOUTH EVERY DAY 90 capsule 1  ? ?Allergies:  ? Iodinated Contrast Media Anaphylaxis, Hives, Itching and Swelling  ? Cephalexin Nausea  ? Duloxetine Hcl Swelling of Legs and Feet  ? Iodine Hives and Itching (Constrast Dye) ? ? Pregabalin Swelling  ?Legs and Feet  ? Spironolacton-Hydrochlorothiaz Rash  ?Only with IV injection.  ? Clarithromycin  Nausea  ? ?Past Medical History:  ? Acute deep vein thrombosis (DVT) of upper extremity, unspecified laterality, unspecified vein (CMS-HCC) 2002  ?with PICC line placement  ? Allergic rhinitis  ? Arthritis  ? BiPAP (biphasic positive airway pressure) dependence  ? Chondromalacia of patella  ? Chronic low back pain 06/28/2019  ? Chronic pain syndrome 11/09/2018  ? DDD (degenerative disc disease), lumbar  ? Depression  ? Fibromyalgia  ? GERD (gastroesophageal reflux disease)  ? Hypertension  ? Incontinence in female 10/05/2016  ? Kidney disease  ? Pancolitis (CMS-HCC) 07/20/2019  ? Peripheral neuropathy  ? Prediabetes 02/13/2021  ? Restless legs syndrome (RLS) 10/03/2009  ? Seasonal allergies  ? Sleep apnea (uses CPAP)  ? Thyroid disease  ? ?Past Surgical History:  ? CEREBRAL ANEURYSM REPAIR 2002  ? lumbar spine fusion surgery 2018  ? Right total knee arthroplasty 02/17/2017 (Dr. Tacey Ruiz)  ? ARTHRODESIS ANTERIOR CERVICLE SPINE N/A 07/10/2019 (Procedure: C4-5 CORPECTOMY WITH C6-7 ACDF, C2-T3 POSTERIOR SPINAL FUSION; Surgeon: Ricard Dillon, MD; Location: [facility]; Service: Neurosurgery)  ? REMOVAL ANTERIOR CERVICLE SPINAL HARDWARE N/A 07/10/2019 (Procedure: INSERTION OF INTERVERTEBRAL BIOMECHANICAL DEVICE WITH INTEGRAL ANTERIOR INSTRUMENTATION, TO VERTEBRAL CORPECTOMY DEFECT, IN CONJUNCTION WITH INTERBODY ARTHRODESIS (LIST CODE FOR PRIMARY PROCEDURE); Surgeon: Ricard Dillon, MD; Location: [place]; Service: Neurosurgery)  ? CORPECTOMY CERVICLE W/DECOMPRESSION BY ANTERIOR APPROACH N/A 07/10/2019 (Procedure: VERTEBRAL CORPECTOMY (VERTEBRAL BODY RESECTION), PARTIAL OR COMPLETE, ANTERIOR APPROACH WITH DECOMPRESSION OF SPINAL CORD AND/OR NERVE ROOT(S); CERVICAL, SINGLE SEGMENT; Surgeon: Ricard Dillon, MD; Location: [place]; Service: Neurosurgery)  ? FUSION CERVICLE SPINE ANTERIOR ONE LEVEL N/A 07/10/2019 (Procedure: ARTHRODESIS, ANTERIOR INTERBODY TECHNIQUE, INCLUDING MINIMAL DISCECTOMY TO  PREPARE INTERSPACE (OTHER THAN FOR DECOMPRESSION); CERVICAL BELOW C2; Surgeon: Ricard Dillon, MD; Location: [place]; Service: Neurosurgery) ? ARTHRODESIS ANTERIOR CERVICLE SPINE N/A 07/10/2019 (Procedure: ARTHRODESIS, ANTERIOR INTERBODY TECHNIQUE, INCLUDING MINIMAL DISCECTOMY TO PREPARE INTERSPACE (OTHER THAN DECOMPRESSION); EACH ADDITIONAL INTERSPACE (LIST IN ADDITION TO CODE FOR PRIMARY PROCEDURE); Surgeon: Ricard Dillon, MD; Location: [facility]; Service: Neurosurgery) ? CORPECTOMY VERTEBRAE PARTIAL/COMPLETE BY ANTERIOR CERVICLE APPROACH N/A 07/10/2019 (Procedure: VERTEBRAL CORPECTOMY, PARTIAL OR COMPLETE, ANTERIOR APPROACH W/DECOMPRESSION SPINAL CORD AND/OR NERVE ROOT; CERVICAL, EACH ADDITIONAL SEGMENT (LIST IN ADDITION TO PRIMARY PROCEDURE); Surgeon: Ricard Dillon, MD; Location: [place]; Service: Neurosurgery)  ? POSTERIOR THORACIC SPINE FUSION/ARTHRODESIS ONE LEVEL N/A 07/10/2019 (Procedure: ARTHRODESIS, POSTERIOR OR POSTEROLATERAL TECHNIQUE, SINGLE LEVEL; CERVICAL BELOW C2 SEGMENT; Surgeon: Ricard Dillon, MD; Location: [place]; Service: Neurosurgery)  ? INSTRUMENTATION POSTERIOR SPINE 13/MORE VERTEBRAL SEGMENTS N/A 07/10/2019 (Procedure: POSTERIOR SEGMENTAL INSTRUMENTATION (EG, PEDICLE FIXATION, DUAL RODS WITH MULTIPLE HOOKS AND SUBLAMINAR WIRES); 7 TO 12 VERTEBRAL SEGMENTS (LIST IN ADDITION TO PRIMARY PROCEDURE); Surgeon: Ricard Dillon, MD; Location: [place]; Service: Neurosurgery) ? INTRAOPERATIVE FLUOROSCOPY N/A 07/10/2019  ?Procedure: FLUOROSCOPY; Surgeon: Ricard Dillon, MD; Location: [place]; Service: Neurosurgery; Laterality: N/A;  ? ANALYSIS PROGRAMMABLE SPINAL INFUSION PUMP N/A 07/10/2019 (Procedure: LAM W/O FACETEC FORAMOT/DSKC 1/2 VRT SEG, CERVICAL; Surgeon: Ricard Dillon, MD; Location: [place]; Service: Neurosurgery) ? Left total  knee arthroplasty using computer-assisted navigation 01/12/2020 (Dr Marry Guan)  ? POSTERIOR LUMBAR  SPINE FUSION ONE LEVEL LATERAL TRANSVERSE TECHNIQUE N/A 07/08/2020 (Procedure: T10-PELVIS PSF, L1-2 XLIF AND POSSBILE L1-2 PCO; Surgeon: Ricard Dillon, MD; Location: [place]; Service: Neurosurgery

## 2022-01-07 ENCOUNTER — Encounter: Payer: Self-pay | Admitting: Surgery

## 2022-01-08 LAB — SURGICAL PATHOLOGY

## 2022-01-12 DIAGNOSIS — Z96611 Presence of right artificial shoulder joint: Secondary | ICD-10-CM | POA: Insufficient documentation

## 2022-02-09 ENCOUNTER — Encounter: Payer: Self-pay | Admitting: Neurology

## 2022-02-09 ENCOUNTER — Ambulatory Visit (INDEPENDENT_AMBULATORY_CARE_PROVIDER_SITE_OTHER): Payer: Medicare Other | Admitting: Neurology

## 2022-02-09 VITALS — BP 116/73 | HR 90 | Ht 65.0 in | Wt 203.0 lb

## 2022-02-09 DIAGNOSIS — Z9989 Dependence on other enabling machines and devices: Secondary | ICD-10-CM

## 2022-02-09 DIAGNOSIS — G4733 Obstructive sleep apnea (adult) (pediatric): Secondary | ICD-10-CM

## 2022-02-09 DIAGNOSIS — Z79891 Long term (current) use of opiate analgesic: Secondary | ICD-10-CM

## 2022-02-09 DIAGNOSIS — G4739 Other sleep apnea: Secondary | ICD-10-CM | POA: Diagnosis not present

## 2022-02-09 NOTE — Patient Instructions (Signed)
Please continue using your autoPAP regularly. While your insurance requires that you use PAP at least 4 hours each night on 70% of the nights, I recommend, that you not skip any nights and use it throughout the night if you can. Getting used to PAP and staying with the treatment long term does take time and patience and discipline. Untreated obstructive sleep apnea when it is moderate to severe can have an adverse impact on cardiovascular health and raise her risk for heart disease, arrhythmias, hypertension, congestive heart failure, stroke and diabetes. Untreated obstructive sleep apnea causes sleep disruption, nonrestorative sleep, and sleep deprivation. This can have an impact on your day to day functioning and cause daytime sleepiness and impairment of cognitive function, memory loss, mood disturbance, and problems focussing. Using PAP regularly can improve these symptoms. ?  ?We may consider bringing you back for an in-lab study in the future to optimize your sleep apnea treatment. 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  ? ?Please follow up routinely to see one of our nurse practitioners in 3-4 months.  ?

## 2022-02-09 NOTE — Progress Notes (Addendum)
Subjective:  ?  ?Patient ID: Lisa Good is a 71 y.o. female. ? ?HPI ? ? ? ?Interim history:  ? ?Lisa Good is a 71 year old right-handed woman with an underlying medical history of allergic rhinitis, arthritis, chondromalacia, degenerative lumbar disc disease, fibromyalgia, reflux disease, hypertension, chronic kidney disease (addendum, 02/09/2022: patient reports no hx of kidney d/s, Hx was taken from PCP note from 08/12/21), peripheral neuropathy, thyroid disease, history of DVT, depression, and obesity, who presents for follow-up consultation of her obstructive sleep apnea, after interim testing and starting AutoPap therapy.  The patient is accompanied by her son today.  I first met her at the request of her primary care physician on 1123, at which time she reported a prior diagnosis of sleep apnea.  She was diagnosed in 2016 and she was on auto BiPAP therapy but has not used her machine in approximately 1 year with only limited usage noted in 2022.  She was advised to proceed with reevaluation with a sleep study.  She had a home sleep test on 11/24/2021 which indicated severe sleep apnea, mostly obstructive with some central events, total AHI of 100.6/h, O2 nadir 83%, snoring ranged from mild to loud.  She is advised to proceed with treatment with AutoPap therapy with the possibility of bringing her back for a titration study as she did have some central apneas with a an AHIc of 18.5/h. ?Her set up date was 12/03/2021.  She has a ResMed air sense 11 AutoSet machine. ? ?Today, 02/09/2022: I reviewed her AutoPap compliance data from 12/03/2021 through 01/01/2022, which is a total of 30 days, during which time she used her machine 29 days with percent use days greater than 4 hours at 87%, indicating very good compliance with an average usage of 5 hours and 14 minutes, residual AHI elevated at 16/h with mostly central events at 10.2/h, average pressure for the 95th percentile at 11.9 cm with a range of 7 to 14 cm with EPR  of 3, leak on the lower side with the 95th percentile at 3.3 L/min.  She reports that her AutoPap is going well.  She continues to have sleep disruption from discomfort in her legs and also nocturia which is about 4 times per average night.  She is followed by pain management and takes 10 mg of oxycodone 4 times a day.  She is also on gabapentin and Robaxin.  She takes high-dose Effexor and also takes trazodone.  She is motivated to continue with her AutoPap, she feels that she sleeps better. ? ?The patient's allergies, current medications, family history, past medical history, past social history, past surgical history and problem list were reviewed and updated as appropriate.  ? ?Previously:  ? ?11/06/21: (She) reports nonrestorative sleep, difficulty sleeping through the night.  She does snore.  She is sleepy during the day.  Her Epworth sleepiness score is 17 out of 24, fatigue severity score is 51 out of 63.  She reports leg pain at night.  Of note, she takes high-dose gabapentin, 800 mg 4 times daily.  She also takes Effexor XR 225 mg daily.  She recalls having had 2 sleep studies.  She also reports that she was initially on CPAP and then it was changed to BiPAP therapy.   ?She was previously diagnosed with obstructive sleep apnea and placed on positive airway pressure treatment.  I reviewed your office note from 08/12/2021.  Prior sleep study results are not available for my review today.  She had been followed  by neurology at [facility] and had also seen neurology through [facility] in 2018 for her sleep apnea.  According to the office note by Dolores Patty, NP, the patient's baseline nocturnal polysomnogram revealed an RDI of 30/h and O2 nadir of 83% with testing in April 2016.  It looks like she was on auto BiPAP therapy at the time.  Her residual AHI reportedly was 8.6/h with central events at 3/h.  ?  ?I reviewed limited compliance data available from 2022. She used her machine 12 days out  of 365 days with limited usage in May and June 2022.  Average usage was 2 hours and 25 minutes, residual AHI elevated at 19.8/h, with mostly obstructive events and central index of 6.3/h.  She has an auto BiPAP machine with a maximum IPAP of 16, minimum EPAP of 8 cm and 5 cm of pressure support. ?She had trouble tolerating the mask, it was cumbersome.  Bedtime is generally between 8 and 9 PM but she may not be asleep until 2 or 3 AM and rise time between 5 and 6 but she may go to the living room and sleep there for another 2 to 3 hours.  She sleeps with a cold pack in her socks to alleviate foot pain.  Her weight has been more or less stable.  She had a tonsillectomy in her early 40s.  She has nocturia about 3 times per average night and does have occasional morning headaches.  She drinks caffeine in the form of tea, about 1 or 2 bottles per day.  She drinks alcohol rarely.  She quit smoking some 10 or 12 years ago. ? ?Her Past Medical History Is Significant For: ?Past Medical History:  ?Diagnosis Date  ? Allergy   ? seasonal  ? Arthritis   ? Cerebral aneurysm   ? Chronic kidney disease   ? Chronic pain syndrome   ? Complication of anesthesia   ? trouble waking up after back surgery  ? DDD (degenerative disc disease), lumbar   ? Depression   ? DVT (deep venous thrombosis) ([place]) 2002  ? Fibromyalgia   ? GERD (gastroesophageal reflux disease)   ? History of kidney stones   ? Hyperlipidemia   ? Hypertension   ? Iron deficiency anemia 11/01/2019  ? Irregular heart beat   ? Neuromuscular disorder ([place])   ? peripheral neuropathy/feet  ? Obesity   ? Pancolitis ([place])   ? Pre-diabetes   ? RLS (restless legs syndrome)   ? Sleep apnea   ? CPAP   ? ? ?Her Past Surgical History Is Significant For: ?Past Surgical History:  ?Procedure Laterality Date  ? BRAIN SURGERY  2002  ? aneurysm  ? CEREBRAL ANEURYSM REPAIR  2002  ? COLONOSCOPY WITH PROPOFOL N/A 11/10/2019  ? Procedure: COLONOSCOPY WITH PROPOFOL;  Surgeon: Jonathon Bellows, MD;   Location: [place];  Service: Endoscopy;  Laterality: N/A;  ? ESOPHAGOGASTRODUODENOSCOPY (EGD) WITH PROPOFOL N/A 11/10/2019  ? Procedure: ESOPHAGOGASTRODUODENOSCOPY (EGD) WITH PROPOFOL;  Surgeon: Jonathon Bellows, MD;  Location: [place];  Service: Endoscopy;  Laterality: N/A;  ? EXTRACORPOREAL SHOCK WAVE LITHOTRIPSY Right 04/27/2019  ? Procedure: EXTRACORPOREAL SHOCK WAVE LITHOTRIPSY (ESWL);  Surgeon: Billey Co, MD;  Location: [facility];  Service: Urology;  Laterality: Right;  ? JOINT REPLACEMENT Right   ? knee  ? KNEE ARTHROPLASTY Left 01/12/2020  ? Procedure: COMPUTER ASSISTED TOTAL KNEE ARTHROPLASTY;  Surgeon: Dereck Leep, MD;  Location: [facility];  Service: Orthopedics;  Laterality: Left;  ? POLYPECTOMY  11/10/2019  ? Procedure: POLYPECTOMY;  Surgeon: Jonathon Bellows, MD;  Location: [place];  Service: Endoscopy;;  ? REVERSE SHOULDER ARTHROPLASTY Right 01/06/2022  ? Procedure: REVERSE SHOULDER ARTHROPLASTY;  Surgeon: Corky Mull, MD;  Location: [facility];  Service: Orthopedics;  Laterality: Right;  ? SPINAL FUSION  07/10/2019  ? C4-5 corpectomy with C6-7 ACDF, C2-T3 spinal fusion  ? SPINE SURGERY    ? spinal fusion  ? TUBAL LIGATION    ? ? ?Her Family History Is Significant For: ?Family History  ?Problem Relation Age of Onset  ? Hypertension Mother   ? Hyperlipidemia Sister   ? Hypertension Sister   ? Depression Sister   ? Depression Brother   ? Sleep apnea Neg Hx   ? ? ?Her Social History Is Significant For: ?Social History  ? ?Socioeconomic History  ? Marital status: Married  ?  Spouse name: Not on file  ? Number of children: Not on file  ? Years of education: Not on file  ? Highest education level: Not on file  ?Occupational History  ? Occupation: retired   ?Tobacco Use  ? Smoking status: Former  ?  Packs/day: 2.00  ?  Years: 20.00  ?  Pack years: 40.00  ?  Types: Cigarettes  ?  Quit date: 10/29/2004  ?  Years since quitting: 17.2  ? Smokeless tobacco: Never  ?Vaping Use  ?  Vaping Use: Never used  ?Substance and Sexual Activity  ? Alcohol use: Yes  ?  Comment: rarely has a glass of wine  ? Drug use: Never  ? Sexual activity: Not Currently  ?Other Topics Concern  ? Not on file

## 2022-03-10 DIAGNOSIS — R7303 Prediabetes: Secondary | ICD-10-CM | POA: Insufficient documentation

## 2022-03-13 ENCOUNTER — Ambulatory Visit
Admission: EM | Admit: 2022-03-13 | Discharge: 2022-03-13 | Disposition: A | Discharge status: Home / Self Care | Payer: Medicare Other | Attending: Emergency Medicine | Admitting: Emergency Medicine

## 2022-03-13 ENCOUNTER — Ambulatory Visit (INDEPENDENT_AMBULATORY_CARE_PROVIDER_SITE_OTHER): Payer: Medicare Other

## 2022-03-13 DIAGNOSIS — Z20822 Contact with and (suspected) exposure to covid-19: Secondary | ICD-10-CM | POA: Diagnosis not present

## 2022-03-13 DIAGNOSIS — J189 Pneumonia, unspecified organism: Secondary | ICD-10-CM | POA: Diagnosis not present

## 2022-03-13 DIAGNOSIS — R059 Cough, unspecified: Secondary | ICD-10-CM | POA: Diagnosis not present

## 2022-03-13 DIAGNOSIS — R062 Wheezing: Secondary | ICD-10-CM | POA: Diagnosis not present

## 2022-03-13 IMAGING — DX DG CHEST 2V
2 series · 2 of 2 positions shown · non-contrast
Comparison: [DATE]

CLINICAL DATA: Cough, left-sided wheezing

EXAM:
CHEST - 2 VIEW

[chest pa]
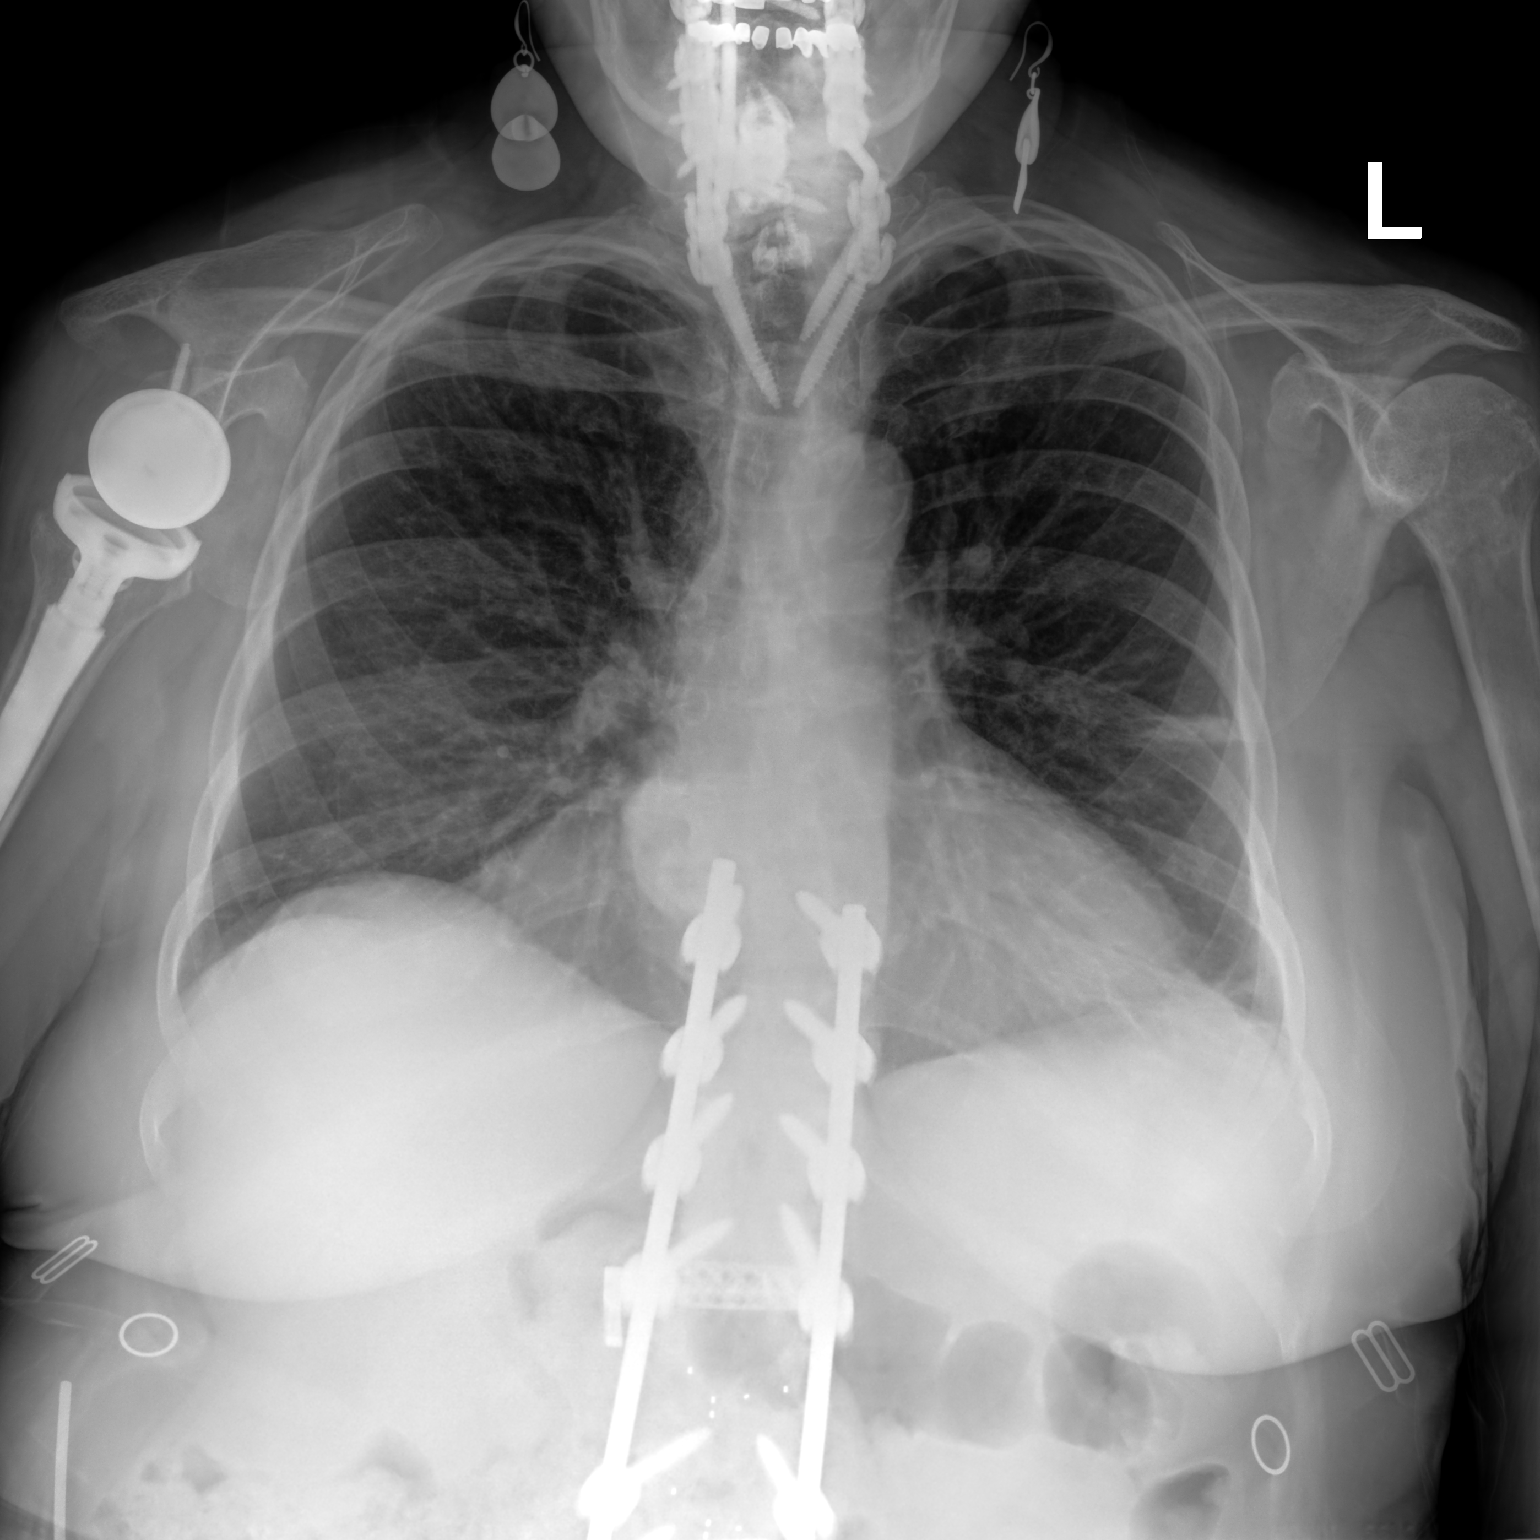

[chest lat]
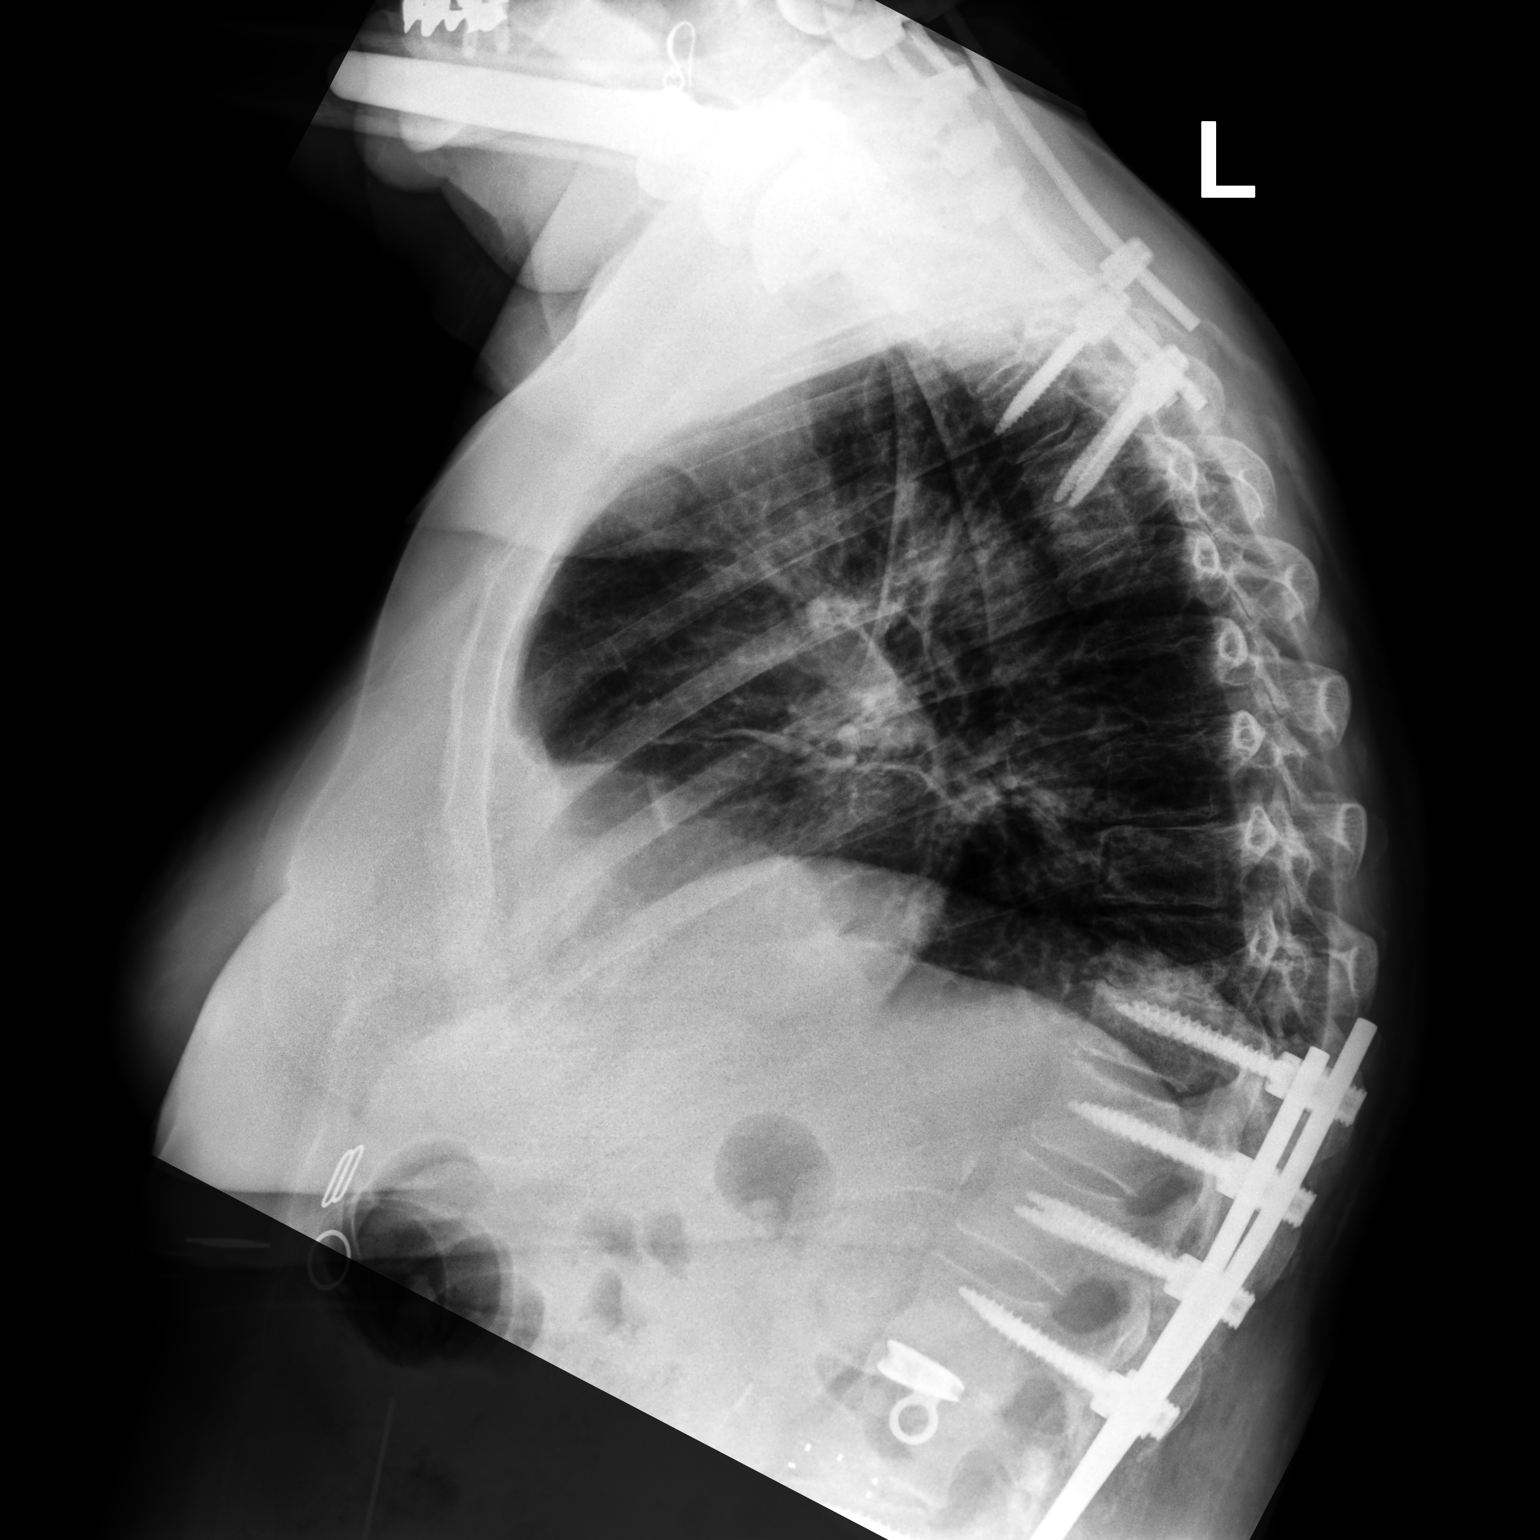

[2 of 2 positions shown; findings below may reference images not displayed]

FINDINGS: Lung volumes are small, but are symmetric. Mild left basilar
atelectasis or infiltrate is present. No pneumothorax or pleural
effusion. Cardiac size within normal limits. Pulmonary vascularity
is normal. No acute bone abnormality. Extensive cervicothoracic and
thoracolumbar fusion hardware is partially visualized. Right total
shoulder arthroplasty has been performed in the interval.
IMPRESSION: Mild left basilar atelectasis or infiltrate.

## 2022-03-13 MED ORDER — FLUTICASONE PROPIONATE 50 MCG/ACT NA SUSP: 2.0000 | Freq: Every day | NASAL | 0 refills | Status: DC

## 2022-03-13 MED ORDER — AZITHROMYCIN 250 MG PO TABS: 250.0000 mg | ORAL_TABLET | Freq: Every day | ORAL | 0 refills | Status: DC

## 2022-03-13 MED ORDER — AMOXICILLIN-POT CLAVULANATE 875-125 MG PO TABS: 1.0000 | ORAL_TABLET | Freq: Two times a day (BID) | ORAL | 0 refills | Status: DC

## 2022-03-13 MED ORDER — ALBUTEROL SULFATE HFA 108 (90 BASE) MCG/ACT IN AERS: 2.0000 | INHALATION_SPRAY | RESPIRATORY_TRACT | 0 refills | Status: DC | PRN

## 2022-03-13 MED ORDER — AEROCHAMBER MV MISC: 1 refills | Status: DC

## 2022-03-13 NOTE — Discharge Instructions (Addendum)
We will contact you if your COVID comes back positive.  We will start you molnupiravir if this is the case.  The meantime, start the Augmentin, azithromycin, saline nasal irrigation with a Milta Deiters Med rinse and distilled water as often as you want.  Flonase.  400 mg of ibuprofen, 1000 mg of Tylenol 3 times a day as needed for body aches, headaches, etc.  2 puffs from your albuterol inhaler using your spacer every 4-6 hours.  Please follow-up with your doctor in 3 days.  Go to the ER if you get worse

## 2022-03-13 NOTE — ED Triage Notes (Signed)
Patient presents to Urgent Care with complaints of generalized body aches and pains, intermittent sinus and cold symptoms since 3 days ago. Patient reports pmh of fibromyalgia but it has not been this bad before.

## 2022-03-13 NOTE — ED Provider Notes (Signed)
HPI  SUBJECTIVE:  Lisa Good is a 71 y.o. female who presents with 3 days of body aches, headaches, postnasal drip, headache, nasal congestion, rhinorrhea, sinus pain and pressure, cough productive of green phlegm, wheezing, shortness of breath, chest tightness.  No fevers, facial swelling, upper dental pain, loss of sense of smell or taste, nausea, vomiting, diarrhea, abdominal pain.  No known COVID exposure.  She had 3 doses of the COVID-vaccine.  She had a negative home COVID test today.  No antibiotics in the past month.  No antipyretic in the past 6 hours.  She saw her PCP several days ago for this, was thought to have a viral syndrome.  She has tried heat, oxycodone, Advil, Tylenol, tea without improvement in her symptoms.  There are no aggravating factors.  She has a past medical history of fibromyalgia, hypertension.  She denies history of COVID, diabetes, chronic kidney disease, pulmonary disease.  PCP: In [place].    Past Medical History:  Diagnosis Date   Allergy    seasonal   Arthritis    Cerebral aneurysm    Chronic kidney disease    Chronic pain syndrome    Complication of anesthesia    trouble waking up after back surgery   DDD (degenerative disc disease), lumbar    Depression    DVT (deep venous thrombosis) ([place]) 2002   Fibromyalgia    GERD (gastroesophageal reflux disease)    History of kidney stones    Hyperlipidemia    Hypertension    Iron deficiency anemia 11/01/2019   Irregular heart beat    Neuromuscular disorder (HCC)    peripheral neuropathy/feet   Obesity    Pancolitis (HCC)    Pre-diabetes    RLS (restless legs syndrome)    Sleep apnea    CPAP     Past Surgical History:  Procedure Laterality Date   BRAIN SURGERY  2002   aneurysm   CEREBRAL ANEURYSM REPAIR  2002   COLONOSCOPY WITH PROPOFOL N/A 11/10/2019   Procedure: COLONOSCOPY WITH PROPOFOL;  Surgeon: Jonathon Bellows, MD;  Location: [facility];  Service: Endoscopy;  Laterality: N/A;    ESOPHAGOGASTRODUODENOSCOPY (EGD) WITH PROPOFOL N/A 11/10/2019   Procedure: ESOPHAGOGASTRODUODENOSCOPY (EGD) WITH PROPOFOL;  Surgeon: Jonathon Bellows, MD;  Location: [facility];  Service: Endoscopy;  Laterality: N/A;   EXTRACORPOREAL SHOCK WAVE LITHOTRIPSY Right 04/27/2019   Procedure: EXTRACORPOREAL SHOCK WAVE LITHOTRIPSY (ESWL);  Surgeon: Billey Co, MD;  Location: [facility];  Service: Urology;  Laterality: Right;   JOINT REPLACEMENT Right    knee   KNEE ARTHROPLASTY Left 01/12/2020   Procedure: COMPUTER ASSISTED TOTAL KNEE ARTHROPLASTY;  Surgeon: Dereck Leep, MD;  Location: [facility];  Service: Orthopedics;  Laterality: Left;   POLYPECTOMY  11/10/2019   Procedure: POLYPECTOMY;  Surgeon: Jonathon Bellows, MD;  Location: [facility];  Service: Endoscopy;;   REVERSE SHOULDER ARTHROPLASTY Right 01/06/2022   Procedure: REVERSE SHOULDER ARTHROPLASTY;  Surgeon: Corky Mull, MD;  Location: [facility];  Service: Orthopedics;  Laterality: Right;   SPINAL FUSION  07/10/2019   C4-5 corpectomy with C6-7 ACDF, C2-T3 spinal fusion   SPINE SURGERY     spinal fusion   TUBAL LIGATION      Family History  Problem Relation Age of Onset   Hypertension Mother    Hyperlipidemia Sister    Hypertension Sister    Depression Sister    Depression Brother    Sleep apnea Neg Hx     Social History  Tobacco Use   Smoking status: Former    Packs/day: 2.00    Years: 20.00    Total pack years: 40.00    Types: Cigarettes    Quit date: 10/29/2004    Years since quitting: 17.3   Smokeless tobacco: Never  Vaping Use   Vaping Use: Never used  Substance Use Topics   Alcohol use: Yes    Comment: rarely has a glass of wine   Drug use: Never    No current facility-administered medications for this encounter.  Current Outpatient Medications:    Alpha-Lipoic Acid 600 MG CAPS, Take 600 mg by mouth 2 (two) times daily., Disp: , Rfl:    amLODipine (NORVASC) 10 MG tablet, Take 10 mg by mouth at  bedtime., Disp: , Rfl:    atorvastatin (LIPITOR) 40 MG tablet, Take 40 mg by mouth every morning., Disp: , Rfl:    Cholecalciferol (VITAMIN D3) 25 MCG (1000 UT) CAPS, Take 1,000 Units by mouth daily. , Disp: , Rfl:    docusate sodium (COLACE) 50 MG capsule, Take 50 mg by mouth every evening., Disp: , Rfl:    famotidine (PEPCID) 20 MG tablet, Take 20 mg by mouth daily as needed for heartburn or indigestion., Disp: , Rfl:    gabapentin (NEURONTIN) 800 MG tablet, Take 1 tablet (800 mg total) by mouth 4 (four) times daily., Disp: 120 tablet, Rfl: 3   ibuprofen (ADVIL) 200 MG tablet, Take 400-600 mg by mouth every 6 (six) hours as needed for moderate pain., Disp: , Rfl:    lidocaine (XYLOCAINE) 2 % solution, Use as directed 15 mLs in the mouth or throat as needed for mouth pain., Disp: 100 mL, Rfl: 0   lisinopril (PRINIVIL,ZESTRIL) 40 MG tablet, Take 40 mg by mouth every morning., Disp: , Rfl:    methocarbamol (ROBAXIN) 500 MG tablet, Take 500 mg by mouth 3 (three) times daily., Disp: , Rfl:    OVER THE COUNTER MEDICATION, Apply 1 application. topically 3 (three) times daily as needed (pain). Outback original pain relieving oil, apply to feet, Disp: , Rfl:    oxyCODONE (ROXICODONE) 5 MG immediate release tablet, Take 1 tablet (5 mg total) by mouth every 4 (four) hours as needed for breakthrough pain., Disp: 40 tablet, Rfl: 0   oxyCODONE-acetaminophen (PERCOCET) 10-325 MG tablet, Take 1 tablet by mouth 4 (four) times daily., Disp: , Rfl:    pantoprazole (PROTONIX) 40 MG tablet, Take 40 mg by mouth 2 (two) times daily., Disp: , Rfl:    tolterodine (DETROL LA) 4 MG 24 hr capsule, Take 1 capsule (4 mg total) by mouth every morning., Disp: , Rfl:    traZODone (DESYREL) 50 MG tablet, Take 100 mg by mouth at bedtime., Disp: , Rfl:    venlafaxine XR (EFFEXOR-XR) 150 MG 24 hr capsule, Take 150 mg by mouth daily with breakfast. Take with 75 mg to equal 225 mg daily, Disp: , Rfl:    venlafaxine XR (EFFEXOR-XR) 75  MG 24 hr capsule, Take 75 mg by mouth daily with breakfast. Take with 150 mg to equal 225 mg daily, Disp: , Rfl:    XYLITOL MT, Use as directed 2 tablets in the mouth or throat daily as needed (dry mouth)., Disp: , Rfl:   Allergies  Allergen Reactions   Contrast Media [Iodinated Contrast Media] Hives   Cephalexin Nausea Only   Clarithromycin Nausea Only   Duloxetine Swelling    Legs and feet   Pregabalin Swelling    Legs and feet  Spironolactone-Hctz Rash    Only with iv injection     ROS  As noted in HPI.   Physical Exam  BP 130/70   Pulse 99   Temp 98.4 F (36.9 C)   Resp 18   SpO2 98%   Constitutional: Well developed, well nourished, no acute distress Eyes:  EOMI, conjunctiva normal bilaterally HENT: Normocephalic, atraumatic,mucus membranes moist.  Positive nasal congestion.  Normal turbinates.  Positive maxillary, frontal sinus tenderness.  Tonsils surgically absent.  No postnasal drip. Neck: Positive cervical lymphadenopathy Respiratory: Normal inspiratory effort, wheezing left side.  Positive anterior, lateral chest wall tenderness Cardiovascular: Normal rate, regular rhythm, no murmurs rubs or gallops GI: nondistended skin: No rash, skin intact Musculoskeletal: no deformities Neurologic: Alert & oriented x 3, no focal neuro deficits Psychiatric: Speech and behavior appropriate   ED Course   Medications - No data to display  Orders Placed This Encounter  Procedures   Novel Coronavirus, NAA ([facility])    Standing Status:   Standing    Number of Occurrences:   1   DG Chest 2 View    Standing Status:   Standing    Number of Occurrences:   1    Order Specific Question:   Reason for Exam (SYMPTOM  OR DIAGNOSIS REQUIRED)    Answer:   Cough, wheezing left side rule out pneumonia    No results found for this or any previous visit (from the past 24 hour(s)). DG Chest 2 View  Result Date: 03/13/2022 CLINICAL DATA:  Cough, left-sided wheezing EXAM: CHEST - 2  VIEW COMPARISON:  07/13/2021 FINDINGS: Lung volumes are small, but are symmetric. Mild left basilar atelectasis or infiltrate is present. No pneumothorax or pleural effusion. Cardiac size within normal limits. Pulmonary vascularity is normal. No acute bone abnormality. Extensive cervicothoracic and thoracolumbar fusion hardware is partially visualized. Right total shoulder arthroplasty has been performed in the interval. IMPRESSION: Mild left basilar atelectasis or infiltrate. Electronically Signed   By: Fidela Salisbury M.D.   On: 03/13/2022 20:01    ED Clinical Impression  1. Encounter for laboratory testing for COVID-19 virus      ED Assessment/Plan  Concern for COVID with body aches.  She will be a candidate for antivirals if COVID is positive.  We will also check a chest x-ray to rule out pneumonia.  Reviewed imaging independently.  Mild left basilar atelectasis or infiltrate.  See radiology report for full details.  Chest x-ray concerning for pneumonia.  will treat as a pneumonia with Augmentin, azithromycin, saline nasal irrigation, Flonase, Tylenol/ibuprofen.  If her COVID is positive, will start molnupiravir as well..  will also send home with albuterol inhaler with a spacer because of the wheezing and chest tightness.  Follow-up with PCP if not getting better in 48 hours.  ER return precautions given  Discussed labs, imaging, MDM, treatment plan, and plan for follow-up with patient. Discussed sn/sx that should prompt return to the ED. patient agrees with plan.   No orders of the defined types were placed in this encounter.     *This clinic note was created using Dragon dictation software. Therefore, there may be occasional mistakes despite careful proofreading.  ?    Melynda Ripple, MD 03/14/22 [phone]

## 2022-03-15 LAB — NOVEL CORONAVIRUS, NAA: SARS-CoV-2, NAA: NOT DETECTED

## 2022-03-31 ENCOUNTER — Other Ambulatory Visit: Payer: Self-pay

## 2022-03-31 ENCOUNTER — Encounter (HOSPITAL_BASED_OUTPATIENT_CLINIC_OR_DEPARTMENT_OTHER): Payer: Self-pay | Admitting: Emergency Medicine

## 2022-03-31 ENCOUNTER — Emergency Department (HOSPITAL_BASED_OUTPATIENT_CLINIC_OR_DEPARTMENT_OTHER)
Admission: EM | Admit: 2022-03-31 | Discharge: 2022-03-31 | Disposition: A | Discharge status: Home / Self Care | Payer: Medicare Other | Attending: Emergency Medicine | Admitting: Emergency Medicine

## 2022-03-31 ENCOUNTER — Emergency Department (HOSPITAL_BASED_OUTPATIENT_CLINIC_OR_DEPARTMENT_OTHER): Payer: Medicare Other

## 2022-03-31 DIAGNOSIS — Z79899 Other long term (current) drug therapy: Secondary | ICD-10-CM | POA: Insufficient documentation

## 2022-03-31 DIAGNOSIS — Z87891 Personal history of nicotine dependence: Secondary | ICD-10-CM | POA: Insufficient documentation

## 2022-03-31 DIAGNOSIS — I10 Essential (primary) hypertension: Secondary | ICD-10-CM | POA: Insufficient documentation

## 2022-03-31 DIAGNOSIS — R1084 Generalized abdominal pain: Secondary | ICD-10-CM | POA: Diagnosis present

## 2022-03-31 DIAGNOSIS — R197 Diarrhea, unspecified: Secondary | ICD-10-CM | POA: Insufficient documentation

## 2022-03-31 LAB — COMPREHENSIVE METABOLIC PANEL
ALT: 8 U/L (ref 0–44)
AST: 15 U/L (ref 15–41)
Albumin: 4.6 g/dL (ref 3.5–5.0)
Alkaline Phosphatase: 99 U/L (ref 38–126)
Anion gap: 13 (ref 5–15)
BUN: 17 mg/dL (ref 8–23)
CO2: 26 mmol/L (ref 22–32)
Calcium: 10.5 mg/dL — ABNORMAL HIGH (ref 8.9–10.3)
Chloride: 102 mmol/L (ref 98–111)
Creatinine, Ser: 0.96 mg/dL (ref 0.44–1.00)
GFR, Estimated: >60 mL/min (ref >60)
Glucose, Bld: 108 mg/dL — ABNORMAL HIGH (ref 70–99)
Potassium: 3.2 mmol/L — ABNORMAL LOW (ref 3.5–5.1)
Sodium: 141 mmol/L (ref 135–145)
Total Bilirubin: 0.4 mg/dL (ref 0.3–1.2)
Total Protein: 7.4 g/dL (ref 6.5–8.1)

## 2022-03-31 LAB — CBC
HCT: 38.6 % (ref 36.0–46.0)
Hemoglobin: 11.9 g/dL — ABNORMAL LOW (ref 12.0–15.0)
MCH: 25.1 pg — ABNORMAL LOW (ref 26.0–34.0)
MCHC: 30.8 g/dL (ref 30.0–36.0)
MCV: 81.4 fL (ref 80.0–100.0)
Platelets: 260 10*3/uL (ref 150–400)
RBC: 4.74 MIL/uL (ref 3.87–5.11)
RDW: 14.9 % (ref 11.5–15.5)
WBC: 6.1 10*3/uL (ref 4.0–10.5)
nRBC: 0 % (ref 0.0–0.2)

## 2022-03-31 LAB — LACTIC ACID, PLASMA: Lactic Acid, Venous: 0.9 mmol/L (ref 0.5–1.9)

## 2022-03-31 LAB — LIPASE, BLOOD: Lipase: 35 U/L (ref 11–51)

## 2022-03-31 MED ORDER — SODIUM CHLORIDE 0.9 % IV BOLUS
500.0000 mL | Freq: Once | INTRAVENOUS | Status: AC
  Administered 2022-03-31: 500 mL via INTRAVENOUS

## 2022-03-31 MED ORDER — LOPERAMIDE HCL 2 MG PO TABS
2.0000 mg | ORAL_TABLET | Freq: Four times a day (QID) | ORAL | 0 refills | Status: DC | PRN
Start: 2022-03-31 — End: 2022-10-03

## 2022-03-31 MED ORDER — SODIUM CHLORIDE 0.9 % IV SOLN: INTRAVENOUS | Status: DC

## 2022-05-11 ENCOUNTER — Ambulatory Visit (INDEPENDENT_AMBULATORY_CARE_PROVIDER_SITE_OTHER): Payer: Medicare Other | Admitting: Urology

## 2022-05-11 ENCOUNTER — Encounter: Payer: Self-pay | Admitting: Urology

## 2022-05-11 VITALS — BP 191/82 | HR 109 | Ht 64.0 in | Wt 198.0 lb

## 2022-05-11 DIAGNOSIS — N3946 Mixed incontinence: Secondary | ICD-10-CM

## 2022-05-11 LAB — URINALYSIS, COMPLETE
Bilirubin, UA: NEGATIVE
Glucose, UA: NEGATIVE
Ketones, UA: NEGATIVE
Nitrite, UA: NEGATIVE
RBC, UA: NEGATIVE
Specific Gravity, UA: 1.025 (ref 1.005–1.030)
Urobilinogen, Ur: 0.2 mg/dL (ref 0.2–1.0)
pH, UA: 5 (ref 5.0–7.5)

## 2022-05-11 LAB — MICROSCOPIC EXAMINATION

## 2022-05-11 NOTE — Addendum Note (Signed)
Addended by: Tommy Rainwater on: 05/11/2022 09:37 AM   Modules accepted: Orders

## 2022-05-11 NOTE — Progress Notes (Signed)
05/11/2022 9:09 AM   Lisa Good 09/16/1951 [id number]  Referring provider: [name], [place] [facility]  [place] [address]  No chief complaint on file.   HPI: I was consulted to assess the patient is urinary incontinence.  She has had 2 synthetic slings and one pubovaginal sling in [place].  She might leak with coughing sneezing with a hard cough but otherwise has no stress incontinence.  She is urge incontinence worse when she goes from a supine to standing position she can soak a pad.  She wears a 2 pads a day.  The positional changes significant trigger.  No bedwetting   She voids every 3-4 hours gets up twice at night   She recently had neck surgery and needs another back operation.  She has had 2 bladder infections in the last 6 months.   By history she recently failed Ditropan and Myrbetriq      well supported bladder neck and negative cough test.  Patient had about a 5 cm vaginal length.  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  I felt some scarring near the cuff.  She had no stress incontinence   Likely patient likely has primarily an overactive bladder with triggering and possibly mild stress incontinence.     Patient had back surgery and did not follow-up.  Last appointment more than a year ago.  She has had surgery on her neck mid back and low back.   She has urge incontinence.  Main symptom is she has high-volume leakage when she goes from a supine to standing position.  She leaks a small amount with coughing sneezing.  No bedwetting. She wears 2 pads a day with a varying amount of leakage   Voids every 2-3 hours gets up 3-4 times a night.   Cystoscopy: normal   Today Patient voided 63 mL during urodynamics with a maximum flow 11 mils per second and had a residual of 100 mL.  Maximum bladder capacity was 237 mL.  Bladder was unstable reaching pressure 4 cm of water.  She felt urgency and leaked.  At 200  mL she had mild leakage at 38 cm of water.  With a Valsalva maneuver at the same volume she leaked a mild amount at 70 cm of water.  At 263 mL she had moderate severe leakage at 64 cm of water.  During voluntary voiding she voided 247 mL with a max of flow 12 mils per second.  Maximum voiding pressure was 31 cm water.  She emptied well with a residual 26 mL.  EMG activity increased during voiding.  She did have a lot of urgency during the study.     Patient has mixed incontinence.  Approximately 80% of her voiding dysfunction is due to an overactive bladder with urge incontinence.  She has very mild stress incontinence.  I will see her back on the new beta 3 agonist.  If this fails I will offer all 4 third line therapies to her.    Patient has peripheral neuropathy   80% improved on the new beta 3 agonist.  Not sure if it aggravated an upper respiratory tract infection and not related to vague abdominal discomfort in my opinion  Patient dramatically better with 80 to 90% reduction urge incontinence improving quality life new beta 3 agonist.   Today Patient cannot afford the full price Gemtesa.  Tolterodine is approximately $80 per month and not working well.  Clinically not infected.  Frequency stable.  Went over all 3 refractory treatments with full templates.   PMH: Past Medical History:  Diagnosis Date   Allergy    seasonal   Arthritis    Cerebral aneurysm    Chronic kidney disease    Chronic pain syndrome    Complication of anesthesia    trouble waking up after back surgery   DDD (degenerative disc disease), lumbar    Depression    DVT (deep venous thrombosis) ([place]) 2002   Fibromyalgia    GERD (gastroesophageal reflux disease)    History of kidney stones    Hyperlipidemia    Hypertension    Iron deficiency anemia 11/01/2019   Irregular heart beat    Neuromuscular disorder (HCC)    peripheral neuropathy/feet   Obesity    Pancolitis (HCC)    Pre-diabetes    RLS (restless  legs syndrome)    Sleep apnea    CPAP     Surgical History: Past Surgical History:  Procedure Laterality Date   BRAIN SURGERY  2002   aneurysm   CEREBRAL ANEURYSM REPAIR  2002   COLONOSCOPY WITH PROPOFOL N/A 11/10/2019   Procedure: COLONOSCOPY WITH PROPOFOL;  Surgeon: Jonathon Bellows, MD;  Location: [facility];  Service: Endoscopy;  Laterality: N/A;   ESOPHAGOGASTRODUODENOSCOPY (EGD) WITH PROPOFOL N/A 11/10/2019   Procedure: ESOPHAGOGASTRODUODENOSCOPY (EGD) WITH PROPOFOL;  Surgeon: Jonathon Bellows, MD;  Location: [facility];  Service: Endoscopy;  Laterality: N/A;   EXTRACORPOREAL SHOCK WAVE LITHOTRIPSY Right 04/27/2019   Procedure: EXTRACORPOREAL SHOCK WAVE LITHOTRIPSY (ESWL);  Surgeon: Billey Co, MD;  Location: [facility];  Service: Urology;  Laterality: Right;   JOINT REPLACEMENT Right    knee   KNEE ARTHROPLASTY Left 01/12/2020   Procedure: COMPUTER ASSISTED TOTAL KNEE ARTHROPLASTY;  Surgeon: Dereck Leep, MD;  Location: [facility];  Service: Orthopedics;  Laterality: Left;   POLYPECTOMY  11/10/2019   Procedure: POLYPECTOMY;  Surgeon: Jonathon Bellows, MD;  Location: [facility];  Service: Endoscopy;;   REVERSE SHOULDER ARTHROPLASTY Right 01/06/2022   Procedure: REVERSE SHOULDER ARTHROPLASTY;  Surgeon: Corky Mull, MD;  Location: [facility];  Service: Orthopedics;  Laterality: Right;   SPINAL FUSION  07/10/2019   C4-5 corpectomy with C6-7 ACDF, C2-T3 spinal fusion   SPINE SURGERY     spinal fusion   TUBAL LIGATION      Home Medications:  Allergies as of 05/11/2022       Reactions   Contrast Media [iodinated Contrast Media] Hives   Cephalexin Nausea Only   Iodine Hives   Clarithromycin Nausea Only   Duloxetine Swelling   Legs and feet   Pregabalin Swelling   Legs and feet   Spironolactone-hctz Rash   Only with iv injection        Medication List        Accurate as of May 11, 2022  9:09 AM. If you have any questions, ask your nurse or doctor.           AeroChamber MV inhaler Use as instructed   albuterol 108 (90 Base) MCG/ACT inhaler Commonly known as: VENTOLIN HFA Inhale 2 puffs into the lungs every 4 (four) hours as needed for wheezing or shortness of breath. Dispense with aerochamber   Alpha-Lipoic Acid 600 MG Caps Take 600 mg by mouth 2 (two) times daily.   amLODipine 10 MG tablet Commonly known as: NORVASC Take 10 mg by mouth at bedtime.   amoxicillin-clavulanate 875-125 MG tablet Commonly known  as: AUGMENTIN Take 1 tablet by mouth every 12 (twelve) hours.   atorvastatin 40 MG tablet Commonly known as: LIPITOR Take 40 mg by mouth every morning.   azithromycin 250 MG tablet Commonly known as: ZITHROMAX Take 1 tablet (250 mg total) by mouth daily. 2 tabs po on day 1, 1 tab po on days 2-5   docusate sodium 50 MG capsule Commonly known as: COLACE Take 50 mg by mouth every evening.   famotidine 20 MG tablet Commonly known as: PEPCID Take 20 mg by mouth daily as needed for heartburn or indigestion.   fluticasone 50 MCG/ACT nasal spray Commonly known as: FLONASE Place 2 sprays into both nostrils daily.   gabapentin 800 MG tablet Commonly known as: NEURONTIN Take 1 tablet (800 mg total) by mouth 4 (four) times daily.   ibuprofen 200 MG tablet Commonly known as: ADVIL Take 400-600 mg by mouth every 6 (six) hours as needed for moderate pain.   lidocaine 2 % solution Commonly known as: XYLOCAINE Use as directed 15 mLs in the mouth or throat as needed for mouth pain.   lisinopril 40 MG tablet Commonly known as: ZESTRIL Take 40 mg by mouth every morning.   loperamide 2 MG tablet Commonly known as: Imodium A-D Take 1 tablet (2 mg total) by mouth 4 (four) times daily as needed for diarrhea or loose stools.   methocarbamol 500 MG tablet Commonly known as: ROBAXIN Take 500 mg by mouth 3 (three) times daily.   OVER THE COUNTER MEDICATION Apply 1 application. topically 3 (three) times daily as needed  (pain). Outback original pain relieving oil, apply to feet   oxyCODONE-acetaminophen 10-325 MG tablet Commonly known as: PERCOCET Take 1 tablet by mouth 4 (four) times daily.   pantoprazole 40 MG tablet Commonly known as: PROTONIX Take 40 mg by mouth 2 (two) times daily.   tolterodine 4 MG 24 hr capsule Commonly known as: Detrol LA Take 1 capsule (4 mg total) by mouth every morning.   traZODone 50 MG tablet Commonly known as: DESYREL Take 100 mg by mouth at bedtime.   venlafaxine XR 150 MG 24 hr capsule Commonly known as: EFFEXOR-XR Take 150 mg by mouth daily with breakfast. Take with 75 mg to equal 225 mg daily   venlafaxine XR 75 MG 24 hr capsule Commonly known as: EFFEXOR-XR Take 75 mg by mouth daily with breakfast. Take with 150 mg to equal 225 mg daily   Vitamin D3 25 MCG (1000 UT) Caps Take 1,000 Units by mouth daily.   XYLITOL MT Use as directed 2 tablets in the mouth or throat daily as needed (dry mouth).        Allergies:  Allergies  Allergen Reactions   Contrast Media [Iodinated Contrast Media] Hives   Cephalexin Nausea Only   Iodine Hives   Clarithromycin Nausea Only   Duloxetine Swelling    Legs and feet   Pregabalin Swelling    Legs and feet   Spironolactone-Hctz Rash    Only with iv injection    Family History: Family History  Problem Relation Age of Onset   Hypertension Mother    Hyperlipidemia Sister    Hypertension Sister    Depression Sister    Depression Brother    Sleep apnea Neg Hx     Social History:  reports that she quit smoking about 17 years ago. Her smoking use included cigarettes. She has a 40.00 pack-year smoking history. She has never used smokeless tobacco. She reports current alcohol  use. She reports that she does not use drugs.  ROS:                                        Physical Exam: There were no vitals taken for this visit.  Constitutional:  Alert and oriented, No acute distress. HEENT:  [place] AT, moist mucus membranes.  Trachea midline, no masses.   Laboratory Data: Lab Results  Component Value Date   WBC 6.1 03/31/2022   HGB 11.9 (L) 03/31/2022   HCT 38.6 03/31/2022   MCV 81.4 03/31/2022   PLT 260 03/31/2022    Lab Results  Component Value Date   CREATININE 0.96 03/31/2022    No results found for: "PSA"  No results found for: "TESTOSTERONE"  No results found for: "HGBA1C"  Urinalysis    Component Value Date/Time   COLORURINE YELLOW (A) 12/31/2021 1106   APPEARANCEUR CLEAR (A) 12/31/2021 1106   APPEARANCEUR Clear 01/13/2021 0912   LABSPEC 1.025 12/31/2021 1106   PHURINE 5.0 12/31/2021 1106   GLUCOSEU NEGATIVE 12/31/2021 1106   HGBUR NEGATIVE 12/31/2021 1106   [place] 12/31/2021 1106   BILIRUBINUR Negative 01/13/2021 0912   KETONESUR NEGATIVE 12/31/2021 1106   PROTEINUR NEGATIVE 12/31/2021 1106   NITRITE NEGATIVE 12/31/2021 1106   LEUKOCYTESUR MODERATE (A) 12/31/2021 1106    Pertinent Imaging:   Assessment & Plan: Patient would like to proceed with Botox.  3 days ciprofloxacin prescription given.  She lives in [facility] and does not want to do percutaneous tibial nerve stimulation.  She has had a lot of back surgery.  There are no diagnoses linked to this encounter.  No follow-ups on file.  Reece Packer, MD  [facility] [address], [place],  [address] [phone]

## 2022-05-13 LAB — CULTURE, URINE COMPREHENSIVE

## 2022-05-15 DIAGNOSIS — M47814 Spondylosis without myelopathy or radiculopathy, thoracic region: Secondary | ICD-10-CM | POA: Insufficient documentation

## 2022-05-15 DIAGNOSIS — F1120 Opioid dependence, uncomplicated: Secondary | ICD-10-CM | POA: Insufficient documentation

## 2022-06-24 ENCOUNTER — Encounter: Payer: Self-pay | Admitting: Urology

## 2022-06-24 NOTE — Progress Notes (Signed)
PATIENT: Lisa Good DOB: 1951/02/21  REASON FOR VISIT: follow up HISTORY FROM: patient  Chief Complaint  Patient presents with   Follow-up    Pt in room #2 and husband in the lobby. Pt here today for f/u.     HISTORY OF PRESENT ILLNESS:  06/29/22 ALL:  Lisa Good is a 71 y.o. female here today for follow up for OSA on CPAP. She has not been as compliant with usage over the past 2-3 months. She reports being ill with norovirus, recently. She feels that CPAP causes her to have nasal congestion. She doesn't use CPAP when her grandson stays with her. She may feel a bit better when using CPAP. She feels that she sleeps a little longer when she uses her machine.     HISTORY: (copied from Dr Guadelupe Sabin previous note)  Lisa Good is a 71 year old right-handed woman with an underlying medical history of allergic rhinitis, arthritis, chondromalacia, degenerative lumbar disc disease, fibromyalgia, reflux disease, hypertension, chronic kidney disease (addendum, 02/09/2022: patient reports no hx of kidney d/s, Hx was taken from PCP note from 08/12/21), peripheral neuropathy, thyroid disease, history of DVT, depression, and obesity, who presents for follow-up consultation of her obstructive sleep apnea, after interim testing and starting AutoPap therapy.  The patient is accompanied by her son today.  I first met her at the request of her primary care physician on 1123, at which time she reported a prior diagnosis of sleep apnea.  She was diagnosed in 2016 and she was on auto BiPAP therapy but has not used her machine in approximately 1 year with only limited usage noted in 2022.  She was advised to proceed with reevaluation with a sleep study.  She had a home sleep test on 11/24/2021 which indicated severe sleep apnea, mostly obstructive with some central events, total AHI of 100.6/h, O2 nadir 83%, snoring ranged from mild to loud.  She is advised to proceed with treatment with AutoPap therapy with the  possibility of bringing her back for a titration study as she did have some central apneas with a an AHIc of 18.5/h. Her set up date was 12/03/2021.  She has a ResMed air sense 11 AutoSet machine.   Today, 02/09/2022: I reviewed her AutoPap compliance data from 12/03/2021 through 01/01/2022, which is a total of 30 days, during which time she used her machine 29 days with percent use days greater than 4 hours at 87%, indicating very good compliance with an average usage of 5 hours and 14 minutes, residual AHI elevated at 16/h with mostly central events at 10.2/h, average pressure for the 95th percentile at 11.9 cm with a range of 7 to 14 cm with EPR of 3, leak on the lower side with the 95th percentile at 3.3 L/min.  She reports that her AutoPap is going well.  She continues to have sleep disruption from discomfort in her legs and also nocturia which is about 4 times per average night.  She is followed by pain management and takes 10 mg of oxycodone 4 times a day.  She is also on gabapentin and Robaxin.  She takes high-dose Effexor and also takes trazodone.  She is motivated to continue with her AutoPap, she feels that she sleeps better.  REVIEW OF SYSTEMS: Out of a complete 14 system review of symptoms, the patient complains only of the following symptoms, chronic pain and all other reviewed systems are negative.  ESS: 13/24  ALLERGIES: Allergies  Allergen Reactions   Contrast  Media [Iodinated Contrast Media] Hives   Cephalexin Nausea Only   Iodine Hives   Clarithromycin Nausea Only   Duloxetine Swelling    Legs and feet   Pregabalin Swelling    Legs and feet   Spironolactone-Hctz Rash    Only with iv injection    HOME MEDICATIONS: Outpatient Medications Prior to Visit  Medication Sig Dispense Refill   albuterol (VENTOLIN HFA) 108 (90 Base) MCG/ACT inhaler Inhale 2 puffs into the lungs every 4 (four) hours as needed for wheezing or shortness of breath. Dispense with aerochamber (Patient taking  differently: Inhale 2 puffs into the lungs as needed for wheezing or shortness of breath. Dispense with aerochamber) 1 each 0   Alpha-Lipoic Acid 600 MG CAPS Take 600 mg by mouth 2 (two) times daily.     amLODipine (NORVASC) 10 MG tablet Take 10 mg by mouth at bedtime.     atorvastatin (LIPITOR) 40 MG tablet Take 40 mg by mouth every morning.     Cholecalciferol (VITAMIN D3) 25 MCG (1000 UT) CAPS Take 1,000 Units by mouth daily.      docusate sodium (COLACE) 50 MG capsule Take 50 mg by mouth every evening.     famotidine (PEPCID) 20 MG tablet Take 20 mg by mouth daily as needed for heartburn or indigestion.     fluticasone (FLONASE) 50 MCG/ACT nasal spray Place 2 sprays into both nostrils daily. (Patient taking differently: Place 2 sprays into both nostrils as needed.) 16 g 0   gabapentin (NEURONTIN) 800 MG tablet Take 1 tablet (800 mg total) by mouth 4 (four) times daily. 120 tablet 3   ibuprofen (ADVIL) 200 MG tablet Take 400-600 mg by mouth as needed for moderate pain.     lidocaine (XYLOCAINE) 2 % solution Use as directed 15 mLs in the mouth or throat as needed for mouth pain. 100 mL 0   lisinopril (PRINIVIL,ZESTRIL) 40 MG tablet Take 40 mg by mouth every morning.     loperamide (IMODIUM A-D) 2 MG tablet Take 1 tablet (2 mg total) by mouth 4 (four) times daily as needed for diarrhea or loose stools. 30 tablet 0   OVER THE COUNTER MEDICATION Apply 1 application. topically 3 (three) times daily as needed (pain). Outback original pain relieving oil, apply to feet     oxyCODONE-acetaminophen (PERCOCET) 10-325 MG tablet Take 1 tablet by mouth 4 (four) times daily.     pantoprazole (PROTONIX) 40 MG tablet Take 40 mg by mouth 2 (two) times daily.     Spacer/Aero-Holding Chambers (AEROCHAMBER MV) inhaler Use as instructed 1 each 1   traZODone (DESYREL) 50 MG tablet Take 100 mg by mouth at bedtime.     venlafaxine XR (EFFEXOR-XR) 150 MG 24 hr capsule Take 150 mg by mouth daily with breakfast. Take with  75 mg to equal 225 mg daily     venlafaxine XR (EFFEXOR-XR) 75 MG 24 hr capsule Take 75 mg by mouth daily with breakfast. Take with 150 mg to equal 225 mg daily     XYLITOL MT Use as directed 2 tablets in the mouth or throat daily as needed (dry mouth).     methocarbamol (ROBAXIN) 500 MG tablet Take 500 mg by mouth 3 (three) times daily. (Patient not taking: Reported on 06/29/2022)     SAVELLA 50 MG TABS tablet Take by mouth. (Patient not taking: Reported on 06/29/2022)     tolterodine (DETROL LA) 4 MG 24 hr capsule Take 1 capsule (4 mg total) by mouth  every morning. (Patient not taking: Reported on 06/29/2022)     No facility-administered medications prior to visit.    PAST MEDICAL HISTORY: Past Medical History:  Diagnosis Date   Allergy    seasonal   Arthritis    Cerebral aneurysm    Chronic kidney disease    Chronic pain syndrome    Complication of anesthesia    trouble waking up after back surgery   DDD (degenerative disc disease), lumbar    Depression    DVT (deep venous thrombosis) ([place]) 2002   Fibromyalgia    GERD (gastroesophageal reflux disease)    History of kidney stones    Hyperlipidemia    Hypertension    Iron deficiency anemia 11/01/2019   Irregular heart beat    Neuromuscular disorder (H[facility])    peripheral neuropathy/feet   Obesity    Pancolitis (HCC)    Pre-diabetes    RLS (restless legs syndrome)    Sleep apnea    CPAP     PAST SURGICAL HISTORY: Past Surgical History:  Procedure Laterality Date   BRAIN SURGERY  2002   aneurysm   CEREBRAL ANEURYSM REPAIR  2002   COLONOSCOPY WITH PROPOFOL N/A 11/10/2019   Procedure: COLONOSCOPY WITH PROPOFOL;  Surgeon: Jonathon Bellows, MD;  Location: [facility];  Service: Endoscopy;  Laterality: N/A;   ESOPHAGOGASTRODUODENOSCOPY (EGD) WITH PROPOFOL N/A 11/10/2019   Procedure: ESOPHAGOGASTRODUODENOSCOPY (EGD) WITH PROPOFOL;  Surgeon: Jonathon Bellows, MD;  Location: [facility];  Service: Endoscopy;  Laterality: N/A;    EXTRACORPOREAL SHOCK WAVE LITHOTRIPSY Right 04/27/2019   Procedure: EXTRACORPOREAL SHOCK WAVE LITHOTRIPSY (ESWL);  Surgeon: Billey Co, MD;  Location: [facility];  Service: Urology;  Laterality: Right;   JOINT REPLACEMENT Right    knee   KNEE ARTHROPLASTY Left 01/12/2020   Procedure: COMPUTER ASSISTED TOTAL KNEE ARTHROPLASTY;  Surgeon: Dereck Leep, MD;  Location: [facility];  Service: Orthopedics;  Laterality: Left;   POLYPECTOMY  11/10/2019   Procedure: POLYPECTOMY;  Surgeon: Jonathon Bellows, MD;  Location: [facility];  Service: Endoscopy;;   REVERSE SHOULDER ARTHROPLASTY Right 01/06/2022   Procedure: REVERSE SHOULDER ARTHROPLASTY;  Surgeon: Corky Mull, MD;  Location: [facility];  Service: Orthopedics;  Laterality: Right;   SPINAL FUSION  07/10/2019   C4-5 corpectomy with C6-7 ACDF, C2-T3 spinal fusion   SPINE SURGERY     spinal fusion   TUBAL LIGATION      FAMILY HISTORY: Family History  Problem Relation Age of Onset   Hypertension Mother    Hyperlipidemia Sister    Hypertension Sister    Depression Sister    Depression Brother    Sleep apnea Neg Hx     SOCIAL HISTORY: Social History   Socioeconomic History   Marital status: Married    Spouse name: Not on file   Number of children: Not on file   Years of education: Not on file   Highest education level: Not on file  Occupational History   Occupation: retired   Tobacco Use   Smoking status: Former    Packs/day: 2.00    Years: 20.00    Total pack years: 40.00    Types: Cigarettes    Quit date: 10/29/2004    Years since quitting: 17.6   Smokeless tobacco: Never  Vaping Use   Vaping Use: Never used  Substance and Sexual Activity   Alcohol use: Yes    Comment: rarely has a glass of wine   Drug use: Never   Sexual activity:  Not Currently  Other Topics Concern   Not on file  Social History Narrative   Not on file   Social Determinants of Health   Financial Resource Strain: Low Risk  (07/20/2019)    Overall Financial Resource Strain (CARDIA)    Difficulty of Paying Living Expenses: Not hard at all  Food Insecurity: No Food Insecurity (07/20/2019)   Hunger Vital Sign    Worried About Running Out of Food in the Last Year: Never true    Ran Out of Food in the Last Year: Never true  Transportation Needs: No Transportation Needs (07/20/2019)   PRAPARE - Hydrologist (Medical): No    Lack of Transportation (Non-Medical): No  Physical Activity: Unknown (07/20/2019)   Exercise Vital Sign    Days of Exercise per Week: Patient refused    Minutes of Exercise per Session: Patient refused  Stress: Unknown (07/20/2019)   [place]    Feeling of Stress : Patient refused  Social Connections: Unknown (07/20/2019)   Social Connection and Isolation Panel [NHANES]    Frequency of Communication with Friends and Family: Patient refused    Frequency of Social Gatherings with Friends and Family: Patient refused    Attends Religious Services: Patient refused    Active Member of Clubs or Organizations: Patient refused    Attends Archivist Meetings: Patient refused    Marital Status: Patient refused  Intimate Partner Violence: Not At Risk (07/20/2019)   Humiliation, Afraid, Rape, and Kick questionnaire    Fear of Current or Ex-Partner: No    Emotionally Abused: No    Physically Abused: No    Sexually Abused: No     PHYSICAL EXAM  Vitals:   06/29/22 1101  BP: (!) 183/85  Pulse: 96  Weight: 198 lb 3.2 oz (89.9 kg)  Height: 5' 4"  (1.626 m)   Body mass index is 34.02 kg/m.  Generalized: Well developed, in no acute distress  Cardiology: normal rate and rhythm, no murmur noted Respiratory: clear to auscultation bilaterally  Neurological examination  Mentation: Alert oriented to time, place, history taking. Follows all commands speech and language fluent Cranial nerve II-XII: Pupils were  equal round reactive to light. Extraocular movements were full, visual field were full  Motor: The motor testing reveals 5 over 5 strength of all 4 extremities. Good symmetric motor tone is noted throughout.  Gait and station: Gait is normal.    DIAGNOSTIC DATA (LABS, IMAGING, TESTING) - I reviewed patient records, labs, notes, testing and imaging myself where available.      No data to display           Lab Results  Component Value Date   WBC 6.1 03/31/2022   HGB 11.9 (L) 03/31/2022   HCT 38.6 03/31/2022   MCV 81.4 03/31/2022   PLT 260 03/31/2022      Component Value Date/Time   NA 141 03/31/2022 1736   K 3.2 (L) 03/31/2022 1736   CL 102 03/31/2022 1736   CO2 26 03/31/2022 1736   GLUCOSE 108 (H) 03/31/2022 1736   BUN 17 03/31/2022 1736   CREATININE 0.96 03/31/2022 1736   CALCIUM 10.5 (H) 03/31/2022 1736   PROT 7.4 03/31/2022 1736   ALBUMIN 4.6 03/31/2022 1736   AST 15 03/31/2022 1736   ALT 8 03/31/2022 1736   ALKPHOS 99 03/31/2022 1736   BILITOT 0.4 03/31/2022 1736   GFRNONAA >60 03/31/2022 1736   GFRAA >60  01/02/2020 1229   No results found for: "CHOL", "HDL", "LDLCALC", "LDLDIRECT", "TRIG", "CHOLHDL" No results found for: "HGBA1C" Lab Results  Component Value Date   VITAMINB12 256 10/31/2019   No results found for: "TSH"   ASSESSMENT AND PLAN 71 y.o. year old female  has a past medical history of Allergy, Arthritis, Cerebral aneurysm, Chronic kidney disease, Chronic pain syndrome, Complication of anesthesia, DDD (degenerative disc disease), lumbar, Depression, DVT (deep venous thrombosis) ([place]) (2002), Fibromyalgia, GERD (gastroesophageal reflux disease), History of kidney stones, Hyperlipidemia, Hypertension, Iron deficiency anemia (11/01/2019), Irregular heart beat, Neuromuscular disorder ([place]), Obesity, Pancolitis ([place]), Pre-diabetes, RLS (restless legs syndrome), and Sleep apnea. here with     ICD-10-CM   1. OSA on CPAP  G47.33    Z99.89         Lisa Good is doing well on CPAP therapy. Compliance report reveals sub optimal compliance. She reports multiple factors contributing to lower compliance. She was encouraged to continue using CPAP nightly and for greater than 4 hours each night. We will update supply orders as indicated. Risks of untreated sleep apnea review and education materials provided. Healthy lifestyle habits encouraged. She will monitor her blood pressure closely and let PCP know if readings remain elevated. She will follow up in 6 months, sooner if needed. She verbalizes understanding and agreement with this plan.    No orders of the defined types were placed in this encounter.    No orders of the defined types were placed in this encounter.     Debbora Presto, FNP-C 06/29/2022, 12:46 PM [facility] [address] [place], [place] [address] [phone]

## 2022-06-29 ENCOUNTER — Encounter: Payer: Self-pay | Admitting: Family Medicine

## 2022-06-29 ENCOUNTER — Ambulatory Visit (INDEPENDENT_AMBULATORY_CARE_PROVIDER_SITE_OTHER): Payer: Medicare Other | Admitting: Family Medicine

## 2022-06-29 VITALS — BP 183/85 | HR 96 | Ht 64.0 in | Wt 198.2 lb

## 2022-06-29 DIAGNOSIS — Z9989 Dependence on other enabling machines and devices: Secondary | ICD-10-CM

## 2022-06-29 DIAGNOSIS — G4733 Obstructive sleep apnea (adult) (pediatric): Secondary | ICD-10-CM

## 2022-06-29 NOTE — Patient Instructions (Addendum)
Please continue using your CPAP regularly. While your insurance requires that you use CPAP at least 4 hours each night on 70% of the nights, I recommend, that you not skip any nights and use it throughout the night if you can. Getting used to CPAP and staying with the treatment long term does take time and patience and discipline. Untreated obstructive sleep apnea when it is moderate to severe can have an adverse impact on cardiovascular health and raise her risk for heart disease, arrhythmias, hypertension, congestive heart failure, stroke and diabetes. Untreated obstructive sleep apnea causes sleep disruption, nonrestorative sleep, and sleep deprivation. This can have an impact on your day to day functioning and cause daytime sleepiness and impairment of cognitive function, memory loss, mood disturbance, and problems focussing. Using CPAP regularly can improve these symptoms.  Focus on using your machine nightly for at least 4 hours. I will reprint download in 3 months. If AHI (apneic events) are below 15 per hour we will continue CPAP. If higher than 15 per hour we will bring you in for a titration study. Use Flonase about 30 minutes before starting CPAP for congestion.   Please keep a close on your BP. Let your primary care provider know if it continues to be elevated at home.   Follow up in 6 months.

## 2022-07-26 ENCOUNTER — Encounter: Payer: Self-pay | Admitting: Emergency Medicine

## 2022-07-26 ENCOUNTER — Ambulatory Visit
Admission: EM | Admit: 2022-07-26 | Discharge: 2022-07-26 | Disposition: A | Discharge status: Home / Self Care | Payer: Medicare Other | Attending: Urgent Care | Admitting: Urgent Care

## 2022-07-26 DIAGNOSIS — N3000 Acute cystitis without hematuria: Secondary | ICD-10-CM | POA: Insufficient documentation

## 2022-07-26 LAB — POCT URINALYSIS DIP (MANUAL ENTRY)
Bilirubin, UA: NEGATIVE
Glucose, UA: NEGATIVE mg/dL
Nitrite, UA: NEGATIVE
Protein Ur, POC: 100 mg/dL — AB
Spec Grav, UA: 1.025 (ref 1.010–1.025)
Urobilinogen, UA: 0.2 E.U./dL
pH, UA: 6 (ref 5.0–8.0)

## 2022-07-26 MED ORDER — NITROFURANTOIN MONOHYD MACRO 100 MG PO CAPS: 100.0000 mg | ORAL_CAPSULE | Freq: Two times a day (BID) | ORAL | 0 refills | Status: DC

## 2022-07-26 NOTE — ED Provider Notes (Signed)
[facility]    CSN: [id number] Arrival date & time: 07/26/22  1521      History   Chief Complaint Chief Complaint  Patient presents with   Urinary Frequency    HPI Lisa Good is a 71 y.o. female.    Urinary Frequency    Patient presents to urgent care with complaint of urinary frequency and lower back pain.  Symptoms starting 3 to 4 weeks ago.  Denies abdominal pain.  Denies fever, chills, myalgias.  Past Medical History:  Diagnosis Date   Allergy    seasonal   Arthritis    Cerebral aneurysm    Chronic kidney disease    Chronic pain syndrome    Complication of anesthesia    trouble waking up after back surgery   DDD (degenerative disc disease), lumbar    Depression    DVT (deep venous thrombosis) ([place]) 2002   Fibromyalgia    GERD (gastroesophageal reflux disease)    History of kidney stones    Hyperlipidemia    Hypertension    Iron deficiency anemia 11/01/2019   Irregular heart beat    Neuromuscular disorder ([place])    peripheral neuropathy/feet   Obesity    Pancolitis (H[facility])    Pre-diabetes    RLS (restless legs syndrome)    Sleep apnea    CPAP     Patient Active Problem List   Diagnosis Date Noted   Chronic right shoulder pain 11/11/2020   Right rotator cuff tear arthropathy 11/11/2020   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   Chronic radicular lumbar pain 12/04/2019   Iron deficiency anemia 11/01/2019   Primary osteoarthritis of left knee 10/17/2019   Primary osteoarthritis of right shoulder 10/17/2019   Hx of cervical spine surgery 08/21/2019   Pancolitis ([place]) 07/20/2019   Kyphosis of cervical region 07/14/2019   Cervical spondylosis with myelopathy 06/28/2019   Chronic low back pain 06/28/2019   Cervical spondylosis without myelopathy 11/09/2018   Foraminal stenosis of cervical region 11/09/2018   Cervical stenosis of spinal canal 11/09/2018   History of fusion of lumbar spine 11/09/2018   Spinal stenosis of lumbar  region with neurogenic claudication 11/09/2018   Postlaminectomy syndrome of lumbosacral region 11/09/2018   Chronic pain syndrome 11/09/2018   Severe obesity (BMI 35.0-39.9) with comorbidity ([place]) 08/18/2018   Primary osteoarthritis of right knee 02/17/2017   Hyperlipidemia 12/02/2016   Incontinence in female 10/05/2016   Spondylolisthesis, lumbar region 05/05/2016   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   OSA (obstructive sleep apnea) 05/31/2015   Hypothyroidism 02/09/2014   Sleep disorder 02/09/2014   Asymptomatic hyperuricemia 08/29/2013   Polyarthritis 08/29/2013   Essential hypertension 06/28/2013   SUI (stress urinary incontinence, female) 06/20/2013   Insomnia 05/22/2013   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   Peripheral neuropathy 05/25/2012   Venous (peripheral) insufficiency 05/25/2012   Depression 10/03/2009   Restless legs syndrome (RLS) 10/03/2009   Fibromyalgia 02/18/2009   Gastro-esophageal reflux disease with esophagitis 10/23/2008   Generalized anxiety disorder 10/23/2008   GERD (gastroesophageal reflux disease) 10/23/2008   Pure hypercholesterolemia 10/23/2008    Past Surgical History:  Procedure Laterality Date   BRAIN SURGERY  2002   aneurysm   CEREBRAL ANEURYSM REPAIR  2002   COLONOSCOPY WITH PROPOFOL N/A 11/10/2019   Procedure: COLONOSCOPY WITH PROPOFOL;  Surgeon: Jonathon Bellows, MD;  Location: [place];  Service: Endoscopy;  Laterality: N/A;   ESOPHAGOGASTRODUODENOSCOPY (EGD) WITH PROPOFOL N/A 11/10/2019   Procedure: ESOPHAGOGASTRODUODENOSCOPY (EGD) WITH PROPOFOL;  Surgeon: Jonathon Bellows, MD;  Location: [place];  Service: Endoscopy;  Laterality: N/A;   EXTRACORPOREAL SHOCK WAVE LITHOTRIPSY Right 04/27/2019   Procedure: EXTRACORPOREAL SHOCK WAVE LITHOTRIPSY (ESWL);  Surgeon: Billey Co, MD;  Location: [facility];  Service: Urology;  Laterality: Right;   JOINT REPLACEMENT Right    knee   KNEE ARTHROPLASTY Left 01/12/2020   Procedure: COMPUTER  ASSISTED TOTAL KNEE ARTHROPLASTY;  Surgeon: Dereck Leep, MD;  Location: [facility];  Service: Orthopedics;  Laterality: Left;   POLYPECTOMY  11/10/2019   Procedure: POLYPECTOMY;  Surgeon: Jonathon Bellows, MD;  Location: [place];  Service: Endoscopy;;   REVERSE SHOULDER ARTHROPLASTY Right 01/06/2022   Procedure: REVERSE SHOULDER ARTHROPLASTY;  Surgeon: Corky Mull, MD;  Location: [facility];  Service: Orthopedics;  Laterality: Right;   SPINAL FUSION  07/10/2019   C4-5 corpectomy with C6-7 ACDF, C2-T3 spinal fusion   SPINE SURGERY     spinal fusion   TUBAL LIGATION      OB History   No obstetric history on file.      Home Medications    Prior to Admission medications   Medication Sig Start Date End Date Taking? Authorizing Provider  albuterol (VENTOLIN HFA) 108 (90 Base) MCG/ACT inhaler Inhale 2 puffs into the lungs every 4 (four) hours as needed for wheezing or shortness of breath. Dispense with aerochamber Patient taking differently: Inhale 2 puffs into the lungs as needed for wheezing or shortness of breath. Dispense with aerochamber 03/13/22   Melynda Ripple, MD  Alpha-Lipoic Acid 600 MG CAPS Take 600 mg by mouth 2 (two) times daily.    [provider]  amLODipine (NORVASC) 10 MG tablet Take 10 mg by mouth at bedtime.    [provider]  atorvastatin (LIPITOR) 40 MG tablet Take 40 mg by mouth every morning.    [provider]  Cholecalciferol (VITAMIN D3) 25 MCG (1000 UT) CAPS Take 1,000 Units by mouth daily.     [provider]  docusate sodium (COLACE) 50 MG capsule Take 50 mg by mouth every evening.    [provider]  famotidine (PEPCID) 20 MG tablet Take 20 mg by mouth daily as needed for heartburn or indigestion.    [provider]  fluticasone (FLONASE) 50 MCG/ACT nasal spray Place 2 sprays into both nostrils daily. Patient taking differently: Place 2 sprays into both nostrils as needed. 03/13/22   Melynda Ripple,  MD  gabapentin (NEURONTIN) 800 MG tablet Take 1 tablet (800 mg total) by mouth 4 (four) times daily. 01/09/21   Gillis Santa, MD  ibuprofen (ADVIL) 200 MG tablet Take 400-600 mg by mouth as needed for moderate pain.    [provider]  lidocaine (XYLOCAINE) 2 % solution Use as directed 15 mLs in the mouth or throat as needed for mouth pain. 07/13/21   Kommor, [place], MD  lisinopril (PRINIVIL,ZESTRIL) 40 MG tablet Take 40 mg by mouth every morning.    [provider]  loperamide (IMODIUM A-D) 2 MG tablet Take 1 tablet (2 mg total) by mouth 4 (four) times daily as needed for diarrhea or loose stools. 03/31/22   Fredia Sorrow, MD  methocarbamol (ROBAXIN) 500 MG tablet Take 500 mg by mouth 3 (three) times daily. Patient not taking: Reported on 06/29/2022 11/26/21   [provider]  OVER THE COUNTER MEDICATION Apply 1 application. topically 3 (three) times daily as needed (pain). Outback original pain relieving oil, apply to feet    [provider]  oxyCODONE-acetaminophen (PERCOCET) 10-325  MG tablet Take 1 tablet by mouth 4 (four) times daily. 11/30/21   [provider]  pantoprazole (PROTONIX) 40 MG tablet Take 40 mg by mouth 2 (two) times daily. 11/17/21   [provider]  SAVELLA 50 MG TABS tablet Take by mouth. Patient not taking: Reported on 06/29/2022 04/15/22   [provider]  Spacer/Aero-Holding Chambers (AEROCHAMBER MV) inhaler Use as instructed 03/13/22   Melynda Ripple, MD  tolterodine (DETROL LA) 4 MG 24 hr capsule Take 1 capsule (4 mg total) by mouth every morning. Patient not taking: Reported on 06/29/2022 01/06/22   Poggi, Marshall Cork, MD  traZODone (DESYREL) 50 MG tablet Take 100 mg by mouth at bedtime.    [provider]  venlafaxine XR (EFFEXOR-XR) 150 MG 24 hr capsule Take 150 mg by mouth daily with breakfast. Take with 75 mg to equal 225 mg daily 10/26/19   [provider]  venlafaxine XR (EFFEXOR-XR) 75 MG 24 hr  capsule Take 75 mg by mouth daily with breakfast. Take with 150 mg to equal 225 mg daily 10/16/21   [provider]  XYLITOL MT Use as directed 2 tablets in the mouth or throat daily as needed (dry mouth).    [provider]    Family History Family History  Problem Relation Age of Onset   Hypertension Mother    Hyperlipidemia Sister    Hypertension Sister    Depression Sister    Depression Brother    Sleep apnea Neg Hx     Social History Social History   Tobacco Use   Smoking status: Former    Packs/day: 2.00    Years: 20.00    Total pack years: 40.00    Types: Cigarettes    Quit date: 10/29/2004    Years since quitting: 17.7   Smokeless tobacco: Never  Vaping Use   Vaping Use: Never used  Substance Use Topics   Alcohol use: Yes    Comment: rarely has a glass of wine   Drug use: Never     Allergies   Contrast media [iodinated contrast media], Cephalexin, Iodine, Clarithromycin, Duloxetine, Pregabalin, and Spironolactone-hctz   Review of Systems Review of Systems  Genitourinary:  Positive for frequency.     Physical Exam Triage Vital Signs ED Triage Vitals  Enc Vitals Group     BP 07/26/22 1535 (!) 153/80     Pulse Rate 07/26/22 1535 (!) 105     Resp 07/26/22 1535 18     Temp 07/26/22 1535 99 F (37.2 C)     Temp src --      SpO2 07/26/22 1535 91 %     Weight --      Height --      Head Circumference --      Peak Flow --      Pain Score 07/26/22 1534 0     Pain Loc --      Pain Edu? --      Excl. in [place]? --    No data found.  Updated Vital Signs BP (!) 153/80   Pulse (!) 105   Temp 99 F (37.2 C)   Resp 18   SpO2 91%   Visual Acuity Right Eye Distance:   Left Eye Distance:   Bilateral Distance:    Right Eye Near:   Left Eye Near:    Bilateral Near:     Physical Exam Vitals reviewed.  Constitutional:      Appearance: Normal appearance.  Skin:  General: Skin is warm and dry.  Neurological:     General: No focal  deficit present.     Mental Status: She is alert and oriented to person, place, and time.  Psychiatric:        Mood and Affect: Mood normal.        Behavior: Behavior normal.      UC Treatments / Results  Labs (all labs ordered are listed, but only abnormal results are displayed) Labs Reviewed  POCT URINALYSIS DIP (MANUAL ENTRY) - Abnormal; Notable for the following components:      Result Value   Ketones, POC UA trace (5) (*)    Blood, UA trace-intact (*)    Protein Ur, POC =100 (*)    Leukocytes, UA Small (1+) (*)    All other components within normal limits    EKG   Radiology No results found.  Procedures Procedures (including critical care time)  Medications Ordered in UC Medications - No data to display  Initial Impression / Assessment and Plan / UC Course  I have reviewed the triage vital signs and the nursing notes.  Pertinent labs & imaging results that were available during my care of the patient were reviewed by me and considered in my medical decision making (see chart for details).   UA positive for small leukocytes and trace blood.  Will treat for acute cystitis without hematuria.  Recent GFR greater than 40.   Final Clinical Impressions(s) / UC Diagnoses   Final diagnoses:  None   Discharge Instructions   None    ED Prescriptions   None    PDMP not reviewed this encounter.   Rose Phi, [place] 07/26/22 1616

## 2022-07-26 NOTE — Discharge Instructions (Addendum)
Follow up here or with your primary care provider if your symptoms are worsening or not improving with treatment.

## 2022-07-26 NOTE — ED Triage Notes (Signed)
Pt is present today with c/o urinary frequency and lower back pain. Pt sx started x3-4 weeks ago.

## 2022-07-28 LAB — URINE CULTURE

## 2022-08-06 ENCOUNTER — Encounter: Payer: Self-pay | Admitting: Urology

## 2022-08-12 ENCOUNTER — Telehealth: Payer: Self-pay

## 2022-08-12 NOTE — Telephone Encounter (Signed)
Steele Merkle [id number] [birth year]  Provider & HPD:FGILU MacDiarmid YYY:[id number]/JKNI:  Procedure OCOD:[id number] Josem Kaufmann not required Drug Code:J0585-Form faxed to [facility] for approval  OAB:100 units   Insurance:Medicare Policy#:[id number] SUIWUAOUM:[id number] Spoke ZD:[id number]   Approved  Denied   Notes:Form faxed to [facility] for botox authorization.   Person collecting information name:S.Cartha Rotert Date:08/12/2022

## 2022-08-25 NOTE — Telephone Encounter (Signed)
Called to follow up on botox authorization but [facility] says that the [facility] was incorrect.  Will work to get authorized.

## 2022-08-31 NOTE — Telephone Encounter (Signed)
I called [facility]. Botox does NOT require pa if done in physician office. It it were being given in a HOPD(hospital out pt dept) then it would require PA. Below comes straight from the CMS website. I spoke with Saralyn Pilar. PPH#-[phone].  12. Q: What provider types require prior authorization for these services? A: Only hospital OPD services require prior authorization as part of this program. Other facility/provider types, such as physician's offices, critical access hospitals, or ambulatory surgery centers that submit claims other than type of bill 13X are not required to submit prior authorization requests.  Thanks.  CM

## 2022-08-31 NOTE — Telephone Encounter (Signed)
.  left message to have patient return my call.  Need pt to come in this week to give UA, UCX for botox  Need to schedule botox as well

## 2022-09-01 ENCOUNTER — Telehealth: Payer: Self-pay | Admitting: *Deleted

## 2022-09-01 ENCOUNTER — Encounter: Payer: Self-pay | Admitting: *Deleted

## 2022-09-01 MED ORDER — CIPROFLOXACIN HCL 500 MG PO TABS
ORAL_TABLET | ORAL | 0 refills | Status: DC
Start: 2022-09-01 — End: 2022-09-21

## 2022-09-01 NOTE — Telephone Encounter (Signed)
Called pt to discuss Botox instructions, scheduled lab visit 09/02/22, advised pt to come 30 min early to have bladder numbed, sent cipro to [facility].

## 2022-09-02 ENCOUNTER — Other Ambulatory Visit: Payer: Self-pay | Admitting: Family Medicine

## 2022-09-02 ENCOUNTER — Other Ambulatory Visit: Payer: Medicare Other

## 2022-09-02 DIAGNOSIS — N3946 Mixed incontinence: Secondary | ICD-10-CM

## 2022-09-03 LAB — URINALYSIS, COMPLETE
Bilirubin, UA: NEGATIVE
Glucose, UA: NEGATIVE
Ketones, UA: NEGATIVE
Nitrite, UA: NEGATIVE
RBC, UA: NEGATIVE
Specific Gravity, UA: 1.03 (ref 1.005–1.030)
Urobilinogen, Ur: 0.2 mg/dL (ref 0.2–1.0)
pH, UA: 5 (ref 5.0–7.5)

## 2022-09-03 LAB — MICROSCOPIC EXAMINATION

## 2022-09-05 LAB — CULTURE, URINE COMPREHENSIVE

## 2022-09-21 ENCOUNTER — Encounter: Payer: Self-pay | Admitting: Urology

## 2022-09-21 ENCOUNTER — Ambulatory Visit (INDEPENDENT_AMBULATORY_CARE_PROVIDER_SITE_OTHER): Payer: Medicare Other | Admitting: Urology

## 2022-09-21 VITALS — BP 174/83 | HR 90 | Ht 64.0 in

## 2022-09-21 DIAGNOSIS — N3946 Mixed incontinence: Secondary | ICD-10-CM | POA: Diagnosis not present

## 2022-09-21 DIAGNOSIS — N3941 Urge incontinence: Secondary | ICD-10-CM

## 2022-09-21 LAB — MICROSCOPIC EXAMINATION

## 2022-09-21 LAB — URINALYSIS, COMPLETE
Bilirubin, UA: NEGATIVE
Glucose, UA: NEGATIVE
Ketones, UA: NEGATIVE
Nitrite, UA: NEGATIVE
RBC, UA: NEGATIVE
Specific Gravity, UA: 1.03 (ref 1.005–1.030)
Urobilinogen, Ur: 0.2 mg/dL (ref 0.2–1.0)
pH, UA: 5 (ref 5.0–7.5)

## 2022-09-21 MED ORDER — ONABOTULINUMTOXINA 100 UNITS IJ SOLR
100.0000 [IU] | Freq: Once | INTRAMUSCULAR | Status: AC
  Administered 2022-09-21: 100 [IU] via INTRAMUSCULAR

## 2022-09-21 NOTE — Progress Notes (Signed)
09/21/2022 10:56 AM   Lisa Good 1950/12/14 [id number]  Referring provider: [name], MD [facility]  [place] [id number]  Chief Complaint  Patient presents with   Procedure    Botox    HPI: I was consulted to assess the patient is urinary incontinence.  She has had 2 synthetic slings and one pubovaginal sling in [place].  She might leak with coughing sneezing with a hard cough but otherwise has no stress incontinence.  She is urge incontinence worse when she goes from a supine to standing position she can soak a pad.  She wears a 2 pads a day.  The positional changes significant trigger.  No bedwetting   She voids every 3-4 hours gets up twice at night   She recently had neck surgery and needs another back operation.  She has had 2 bladder infections in the last 6 months.   By history she recently failed Ditropan and Myrbetriq      well supported bladder neck and negative cough test.  Patient had about a 5 cm vaginal length.  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  I felt some scarring near the cuff.  She had no stress incontinence   Likely patient likely has primarily an overactive bladder with triggering and possibly mild stress incontinence.     Patient had back surgery and did not follow-up.  Last appointment more than a year ago.  She has had surgery on her neck mid back and low back.   She has urge incontinence.  Main symptom is she has high-volume leakage when she goes from a supine to standing position.  She leaks a small amount with coughing sneezing.  No bedwetting. She wears 2 pads a day with a varying amount of leakage   Voids every 2-3 hours gets up 3-4 times a night.   Cystoscopy: normal   Today Patient voided 63 mL during urodynamics with a maximum flow 11 mils per second and had a residual of 100 mL.  Maximum bladder capacity was 237 mL.  Bladder was unstable reaching pressure 4 cm of water.   She felt urgency and leaked.  At 200 mL she had mild leakage at 38 cm of water.  With a Valsalva maneuver at the same volume she leaked a mild amount at 70 cm of water.  At 263 mL she had moderate severe leakage at 64 cm of water.  During voluntary voiding she voided 247 mL with a max of flow 12 mils per second.  Maximum voiding pressure was 31 cm water.  She emptied well with a residual 26 mL.  EMG activity increased during voiding.  She did have a lot of urgency during the study.     Patient has mixed incontinence.  Approximately 80% of her voiding dysfunction is due to an overactive bladder with urge incontinence.  She has very mild stress incontinence.  I will see her back on the new beta 3 agonist.  If this fails I will offer all 4 third line therapies to her.    Patient has peripheral neuropathy   80% improved on the new beta 3 agonist.  Not sure if it aggravated an upper respiratory tract infection and not related to vague abdominal discomfort in my opinion   Patient dramatically better with 80 to 90% reduction urge incontinence improving quality life new beta 3 agonist.    Today Patient cannot afford the full price Gemtesa.  Tolterodine is approximately $80 per month and not working well.  Clinically not infected.  Frequency stable.   Went over all 3 refractory treatments with full templates.  Patient would like to proceed with Botox. 3 days ciprofloxacin prescription given. She lives in [facility] and does not want to do percutaneous tibial nerve stimulation. She has had a lot of back surgery.     Today Frequency stable.  Incontinence stable.  Clinically not infected.  Took ciprofloxacin as per protocol Significant worsening of vagina Cystoscopy: Patient underwent flexible cystoscopy.  Bladder mucosa and trigone were normal.  No cystitis.  I injected 100 units of Botox and 10 cc of normal saline with more of the modified midline and low midline template and to the patient's left side.   Minimal bleeding.  She was a bit uncomfortable but did very well   PMH: Past Medical History:  Diagnosis Date   Allergy    seasonal   Arthritis    Cerebral aneurysm    Chronic kidney disease    Chronic pain syndrome    Complication of anesthesia    trouble waking up after back surgery   DDD (degenerative disc disease), lumbar    Depression    DVT (deep venous thrombosis) ([place]) 2002   Fibromyalgia    GERD (gastroesophageal reflux disease)    History of kidney stones    Hyperlipidemia    Hypertension    Iron deficiency anemia 11/01/2019   Irregular heart beat    Neuromuscular disorder ([place])    peripheral neuropathy/feet   Obesity    Pancolitis ([facility])    Pre-diabetes    RLS (restless legs syndrome)    Sleep apnea    CPAP     Surgical History: Past Surgical History:  Procedure Laterality Date   BRAIN SURGERY  2002   aneurysm   CEREBRAL ANEURYSM REPAIR  2002   COLONOSCOPY WITH PROPOFOL N/A 11/10/2019   Procedure: COLONOSCOPY WITH PROPOFOL;  Surgeon: Jonathon Bellows, MD;  Location: [facility];  Service: Endoscopy;  Laterality: N/A;   ESOPHAGOGASTRODUODENOSCOPY (EGD) WITH PROPOFOL N/A 11/10/2019   Procedure: ESOPHAGOGASTRODUODENOSCOPY (EGD) WITH PROPOFOL;  Surgeon: Jonathon Bellows, MD;  Location: [facility];  Service: Endoscopy;  Laterality: N/A;   EXTRACORPOREAL SHOCK WAVE LITHOTRIPSY Right 04/27/2019   Procedure: EXTRACORPOREAL SHOCK WAVE LITHOTRIPSY (ESWL);  Surgeon: Billey Co, MD;  Location: [facility];  Service: Urology;  Laterality: Right;   JOINT REPLACEMENT Right    knee   KNEE ARTHROPLASTY Left 01/12/2020   Procedure: COMPUTER ASSISTED TOTAL KNEE ARTHROPLASTY;  Surgeon: Dereck Leep, MD;  Location: [facility];  Service: Orthopedics;  Laterality: Left;   POLYPECTOMY  11/10/2019   Procedure: POLYPECTOMY;  Surgeon: Jonathon Bellows, MD;  Location: [facility];  Service: Endoscopy;;   REVERSE SHOULDER ARTHROPLASTY Right 01/06/2022   Procedure:  REVERSE SHOULDER ARTHROPLASTY;  Surgeon: Corky Mull, MD;  Location: [facility];  Service: Orthopedics;  Laterality: Right;   SPINAL FUSION  07/10/2019   C4-5 corpectomy with C6-7 ACDF, C2-T3 spinal fusion   SPINE SURGERY     spinal fusion   TUBAL LIGATION      Home Medications:  Allergies as of 09/21/2022       Reactions   Contrast Media [iodinated Contrast Media] Hives   Cephalexin Nausea Only   Iodine Hives   Clarithromycin Nausea Only   Duloxetine Swelling   Legs and feet   Pregabalin Swelling   Legs and feet   Spironolactone-hctz Rash  Only with iv injection        Medication List        Accurate as of September 21, 2022 10:56 AM. If you have any questions, ask your nurse or doctor.          albuterol 108 (90 Base) MCG/ACT inhaler Commonly known as: VENTOLIN HFA Inhale 2 puffs into the lungs every 4 (four) hours as needed for wheezing or shortness of breath. Dispense with aerochamber What changed: when to take this   Alpha-Lipoic Acid 600 MG Caps Take 600 mg by mouth 2 (two) times daily.   amLODipine 10 MG tablet Commonly known as: NORVASC Take 10 mg by mouth at bedtime.   atorvastatin 40 MG tablet Commonly known as: LIPITOR Take 40 mg by mouth every morning.   ciprofloxacin 500 MG tablet Commonly known as: CIPRO Take 1 tablet the day before procedure, day of procedure and day after   docusate sodium 50 MG capsule Commonly known as: COLACE Take 50 mg by mouth every evening.   famotidine 20 MG tablet Commonly known as: PEPCID Take 20 mg by mouth daily as needed for heartburn or indigestion.   fluticasone 50 MCG/ACT nasal spray Commonly known as: FLONASE Place 2 sprays into both nostrils daily. What changed:  when to take this reasons to take this   gabapentin 800 MG tablet Commonly known as: NEURONTIN Take 1 tablet (800 mg total) by mouth 4 (four) times daily.   ibuprofen 200 MG tablet Commonly known as: ADVIL Take 400-600 mg by mouth  as needed for moderate pain.   lidocaine 2 % solution Commonly known as: XYLOCAINE Use as directed 15 mLs in the mouth or throat as needed for mouth pain.   lisinopril 40 MG tablet Commonly known as: ZESTRIL Take 40 mg by mouth every morning.   loperamide 2 MG tablet Commonly known as: Imodium A-D Take 1 tablet (2 mg total) by mouth 4 (four) times daily as needed for diarrhea or loose stools.   OVER THE COUNTER MEDICATION Apply 1 application. topically 3 (three) times daily as needed (pain). [place] original pain relieving oil, apply to feet   oxyCODONE-acetaminophen 10-325 MG tablet Commonly known as: PERCOCET Take 1 tablet by mouth 4 (four) times daily.   pantoprazole 40 MG tablet Commonly known as: PROTONIX Take 40 mg by mouth 2 (two) times daily.   traZODone 100 MG tablet Commonly known as: DESYREL Take 1 tablet by mouth at bedtime.   venlafaxine XR 150 MG 24 hr capsule Commonly known as: EFFEXOR-XR Take 150 mg by mouth daily with breakfast. Take with 75 mg to equal 225 mg daily   venlafaxine XR 75 MG 24 hr capsule Commonly known as: EFFEXOR-XR Take 75 mg by mouth daily with breakfast. Take with 150 mg to equal 225 mg daily   Vitamin D3 25 MCG (1000 UT) Caps Take 1,000 Units by mouth daily.   XYLITOL MT Use as directed 2 tablets in the mouth or throat daily as needed (dry mouth).        Allergies:  Allergies  Allergen Reactions   Contrast Media [Iodinated Contrast Media] Hives   Cephalexin Nausea Only   Iodine Hives   Clarithromycin Nausea Only   Duloxetine Swelling    Legs and feet   Pregabalin Swelling    Legs and feet   Spironolactone-Hctz Rash    Only with iv injection    Family History: Family History  Problem Relation Age of Onset   Hypertension Mother  Hyperlipidemia Sister    Hypertension Sister    Depression Sister    Depression Brother    Sleep apnea Neg Hx     Social History:  reports that she quit smoking about 17 years ago.  Her smoking use included cigarettes. She has a 40.00 pack-year smoking history. She has never used smokeless tobacco. She reports current alcohol use. She reports that she does not use drugs.  ROS:                                        Physical Exam: There were no vitals taken for this visit.  Constitutional:  Alert and oriented, No acute distress. HEENT: [place] AT, moist mucus membranes.  Trachea midline, no masses.   Laboratory Data: Lab Results  Component Value Date   WBC 6.1 03/31/2022   HGB 11.9 (L) 03/31/2022   HCT 38.6 03/31/2022   MCV 81.4 03/31/2022   PLT 260 03/31/2022    Lab Results  Component Value Date   CREATININE 0.96 03/31/2022    No results found for: "PSA"  No results found for: "TESTOSTERONE"  No results found for: "HGBA1C"  Urinalysis    Component Value Date/Time   COLORURINE YELLOW (A) 12/31/2021 1106   APPEARANCEUR Hazy (A) 09/02/2022 1326   LABSPEC 1.025 12/31/2021 1106   PHURINE 5.0 12/31/2021 1106   GLUCOSEU Negative 09/02/2022 1326   HGBUR NEGATIVE 12/31/2021 1106   BILIRUBINUR Negative 09/02/2022 1326   KETONESUR trace (5) (A) 07/26/2022 1550   KETONESUR NEGATIVE 12/31/2021 1106   PROTEINUR 1+ (A) 09/02/2022 1326   PROTEINUR NEGATIVE 12/31/2021 1106   UROBILINOGEN 0.2 07/26/2022 1550   NITRITE Negative 09/02/2022 1326   NITRITE NEGATIVE 12/31/2021 1106   LEUKOCYTESUR Trace (A) 09/02/2022 1326   LEUKOCYTESUR MODERATE (A) 12/31/2021 1106    Pertinent Imaging:   Assessment & Plan: Follow patient as per protocol  1. Mixed incontinence  - Urinalysis, Complete   No follow-ups on file.  Reece Packer, MD  [facility] [address] [place],  [address] [phone]

## 2022-09-26 ENCOUNTER — Encounter: Payer: Self-pay | Admitting: Urology

## 2022-10-03 ENCOUNTER — Ambulatory Visit
Admission: RE | Admit: 2022-10-03 | Discharge: 2022-10-03 | Disposition: A | Discharge status: Home / Self Care | Payer: Medicare Other | Source: Ambulatory Visit | Attending: Emergency Medicine | Admitting: Emergency Medicine

## 2022-10-03 VITALS — BP 142/84 | HR 97 | Temp 97.9°F | Resp 16

## 2022-10-03 DIAGNOSIS — J0100 Acute maxillary sinusitis, unspecified: Secondary | ICD-10-CM | POA: Insufficient documentation

## 2022-10-03 DIAGNOSIS — H66002 Acute suppurative otitis media without spontaneous rupture of ear drum, left ear: Secondary | ICD-10-CM | POA: Diagnosis present

## 2022-10-03 DIAGNOSIS — J019 Acute sinusitis, unspecified: Secondary | ICD-10-CM | POA: Diagnosis present

## 2022-10-03 DIAGNOSIS — B9689 Other specified bacterial agents as the cause of diseases classified elsewhere: Secondary | ICD-10-CM

## 2022-10-03 DIAGNOSIS — J029 Acute pharyngitis, unspecified: Secondary | ICD-10-CM | POA: Diagnosis present

## 2022-10-03 LAB — POCT RAPID STREP A (OFFICE): Rapid Strep A Screen: NEGATIVE

## 2022-10-03 MED ORDER — IPRATROPIUM BROMIDE 0.06 % NA SOLN: 2.0000 | Freq: Three times a day (TID) | NASAL | 1 refills | Status: DC

## 2022-10-03 MED ORDER — CEFDINIR 300 MG PO CAPS: 300.0000 mg | ORAL_CAPSULE | Freq: Two times a day (BID) | ORAL | 0 refills | Status: AC

## 2022-10-03 NOTE — Discharge Instructions (Addendum)
Your strep test today is negative.  Streptococcal throat culture will be performed per our protocol.  The result of your throat culture will be posted to your MyChart once it is complete, this typically takes 3 to 5 days.  If your streptococcal throat culture is positive, you will be contacted by phone.   Physical exam today revealed infectious material behind your left eardrum.  Because you are experiencing sinus pressure, I believe it is possible that you have a bacterial sinus infection as well and I recommend treatment with antibiotics for 10 days.  Please read below to learn more about the medications, dosages and frequencies that I recommend to help alleviate your symptoms and to get you feeling better soon:    Omnicef (cefdinir):  1 capsule twice daily for 10 days, you can take it with or without food.  This antibiotic can cause upset stomach, this will resolve once antibiotics are complete.  You are welcome to use a probiotic, eat yogurt, take Imodium while taking this medication.  Please avoid other systemic medications such as Maalox, Pepto-Bismol or milk of magnesia as they can interfere with your body's ability to absorb the antibiotics.       Atrovent (ipratropium): This is an excellent nasal decongestant spray that does not cause rebound congestion, please instill 2 sprays into each nare with each use.  Please use the spray up to 4 times daily as needed.  I have provided you with a prescription for this medication.      Advil, Motrin (ibuprofen): This is a good anti-inflammatory medication which not only addresses aches, pains but also significantly reduces soft tissue inflammation of the upper airways that causes sinus and nasal congestion as well as inflammation of the lower airways which makes you feel like your breathing is constricted or your cough feel tight.  I recommend that you take 400 mg every 8 hours as needed.      If you find that you have not had improvement of your symptoms  in the next 5 to 7 days, please follow-up with your primary care provider or return here to urgent care for repeat evaluation and further recommendations.   Thank you for visiting urgent care today.  We appreciate the opportunity to participate in your care.

## 2022-10-03 NOTE — ED Triage Notes (Signed)
Pt c/o congestion, headache and sore throat x4 days.   Home interventions: none

## 2022-10-03 NOTE — ED Provider Notes (Incomplete)
[facility]    CSN: [id number] Arrival date & time: 10/03/22  1245    HISTORY   Chief Complaint  Patient presents with   Sore Throat   Headache   Nasal Congestion   HPI Lisa Good is a pleasant, 71 y.o. female who presents to urgent care today. Patient complains of nasal congestion, headache, sore throat for the past 4 days.  Patient states she is not sure anything to alleviate her symptoms.  Patient has decreased oxygen saturation on arrival today with a slightly lower temperature.  Patient has a history of asthma and allergies,    Past Medical History:  Diagnosis Date   Allergy    seasonal   Arthritis    Cerebral aneurysm    Chronic kidney disease    Chronic pain syndrome    Complication of anesthesia    trouble waking up after back surgery   DDD (degenerative disc disease), lumbar    Depression    DVT (deep venous thrombosis) ([place]) 2002   Fibromyalgia    GERD (gastroesophageal reflux disease)    History of kidney stones    Hyperlipidemia    Hypertension    Iron deficiency anemia 11/01/2019   Irregular heart beat    Neuromuscular disorder ([place])    peripheral neuropathy/feet   Obesity    Pancolitis ([facility])    Pre-diabetes    RLS (restless legs syndrome)    Sleep apnea    CPAP    Patient Active Problem List   Diagnosis Date Noted   Chronic right shoulder pain 11/11/2020   Right rotator cuff tear arthropathy 11/11/2020   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   Chronic radicular lumbar pain 12/04/2019   Iron deficiency anemia 11/01/2019   Primary osteoarthritis of left knee 10/17/2019   Primary osteoarthritis of right shoulder 10/17/2019   Hx of cervical spine surgery 08/21/2019   Pancolitis ([place]) 07/20/2019   Kyphosis of cervical region 07/14/2019   Cervical spondylosis with myelopathy 06/28/2019   Chronic low back pain 06/28/2019   Cervical spondylosis without myelopathy 11/09/2018   Foraminal stenosis of cervical region 11/09/2018    Cervical stenosis of spinal canal 11/09/2018   History of fusion of lumbar spine 11/09/2018   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   Postlaminectomy syndrome of lumbosacral region 11/09/2018   Chronic pain syndrome 11/09/2018   Severe obesity (BMI 35.0-39.9) with comorbidity ([place]) 08/18/2018   Primary osteoarthritis of right knee 02/17/2017   Hyperlipidemia 12/02/2016   Incontinence in female 10/05/2016   Spondylolisthesis, lumbar region 05/05/2016   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   OSA (obstructive sleep apnea) 05/31/2015   Hypothyroidism 02/09/2014   Sleep disorder 02/09/2014   Asymptomatic hyperuricemia 08/29/2013   Polyarthritis 08/29/2013   Essential hypertension 06/28/2013   SUI (stress urinary incontinence, female) 06/20/2013   Insomnia 05/22/2013   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   Peripheral neuropathy 05/25/2012   Venous (peripheral) insufficiency 05/25/2012   Depression 10/03/2009   Restless legs syndrome (RLS) 10/03/2009   Fibromyalgia 02/18/2009   Gastro-esophageal reflux disease with esophagitis 10/23/2008   Generalized anxiety disorder 10/23/2008   GERD (gastroesophageal reflux disease) 10/23/2008   Pure hypercholesterolemia 10/23/2008   Past Surgical History:  Procedure Laterality Date   BRAIN SURGERY  2002   aneurysm   CEREBRAL ANEURYSM REPAIR  2002   COLONOSCOPY WITH PROPOFOL N/A 11/10/2019   Procedure: COLONOSCOPY WITH PROPOFOL;  Surgeon: Jonathon Bellows, MD;  Location: [place];  Service: Endoscopy;  Laterality: N/A;  ESOPHAGOGASTRODUODENOSCOPY (EGD) WITH PROPOFOL N/A 11/10/2019   Procedure: ESOPHAGOGASTRODUODENOSCOPY (EGD) WITH PROPOFOL;  Surgeon: Jonathon Bellows, MD;  Location: [place];  Service: Endoscopy;  Laterality: N/A;   EXTRACORPOREAL SHOCK WAVE LITHOTRIPSY Right 04/27/2019   Procedure: EXTRACORPOREAL SHOCK WAVE LITHOTRIPSY (ESWL);  Surgeon: Billey Co, MD;  Location: [facility];  Service:  Urology;  Laterality: Right;   JOINT REPLACEMENT Right    knee   KNEE ARTHROPLASTY Left 01/12/2020   Procedure: COMPUTER ASSISTED TOTAL KNEE ARTHROPLASTY;  Surgeon: Dereck Leep, MD;  Location: [facility];  Service: Orthopedics;  Laterality: Left;   POLYPECTOMY  11/10/2019   Procedure: POLYPECTOMY;  Surgeon: Jonathon Bellows, MD;  Location: [place];  Service: Endoscopy;;   REVERSE SHOULDER ARTHROPLASTY Right 01/06/2022   Procedure: REVERSE SHOULDER ARTHROPLASTY;  Surgeon: Corky Mull, MD;  Location: [facility];  Service: Orthopedics;  Laterality: Right;   SPINAL FUSION  07/10/2019   C4-5 corpectomy with C6-7 ACDF, C2-T3 spinal fusion   SPINE SURGERY     spinal fusion   TUBAL LIGATION     OB History   No obstetric history on file.    Home Medications    Prior to Admission medications   Medication Sig Start Date End Date Taking? Authorizing Provider  albuterol (VENTOLIN HFA) 108 (90 Base) MCG/ACT inhaler Inhale 2 puffs into the lungs every 4 (four) hours as needed for wheezing or shortness of breath. Dispense with aerochamber 03/13/22   Melynda Ripple, MD  Alpha-Lipoic Acid 600 MG CAPS Take 600 mg by mouth 2 (two) times daily.    [provider]  amLODipine (NORVASC) 10 MG tablet Take 10 mg by mouth at bedtime.    [provider]  atorvastatin (LIPITOR) 40 MG tablet Take 40 mg by mouth every morning.    [provider]  Cholecalciferol (VITAMIN D3) 25 MCG (1000 UT) CAPS Take 1,000 Units by mouth daily.     [provider]  docusate sodium (COLACE) 50 MG capsule Take 50 mg by mouth every evening.    [provider]  famotidine (PEPCID) 20 MG tablet Take 20 mg by mouth daily as needed for heartburn or indigestion.    [provider]  fluticasone (FLONASE) 50 MCG/ACT nasal spray Place 2 sprays into both nostrils daily. 03/13/22   Melynda Ripple, MD  gabapentin (NEURONTIN) 800 MG tablet Take 1 tablet (800 mg total) by mouth 4  (four) times daily. 01/09/21   Gillis Santa, MD  ibuprofen (ADVIL) 200 MG tablet Take 400-600 mg by mouth as needed for moderate pain.    [provider]  lidocaine (XYLOCAINE) 2 % solution Use as directed 15 mLs in the mouth or throat as needed for mouth pain. 07/13/21   Kommor, [place], MD  lisinopril (PRINIVIL,ZESTRIL) 40 MG tablet Take 40 mg by mouth every morning.    [provider]  loperamide (IMODIUM A-D) 2 MG tablet Take 1 tablet (2 mg total) by mouth 4 (four) times daily as needed for diarrhea or loose stools. 03/31/22   Fredia Sorrow, MD  OVER THE COUNTER MEDICATION Apply 1 application. topically 3 (three) times daily as needed (pain). [employer] original pain relieving oil, apply to feet    [provider]  oxyCODONE-acetaminophen (PERCOCET) 10-325 MG tablet Take 1 tablet by mouth 4 (four) times daily. 11/30/21   [provider]  pantoprazole (PROTONIX) 40 MG tablet Take 40 mg by mouth 2 (two) times daily. 11/17/21   [provider]  traZODone (DESYREL) 100  MG tablet Take 1 tablet by mouth at bedtime. 08/14/22   [provider]  venlafaxine XR (EFFEXOR-XR) 150 MG 24 hr capsule Take 150 mg by mouth daily with breakfast. Take with 75 mg to equal 225 mg daily 10/26/19   [provider]  venlafaxine XR (EFFEXOR-XR) 75 MG 24 hr capsule Take 75 mg by mouth daily with breakfast. Take with 150 mg to equal 225 mg daily 10/16/21   [provider]  XYLITOL MT Use as directed 2 tablets in the mouth or throat daily as needed (dry mouth).    [provider]    Family History Family History  Problem Relation Age of Onset   Hypertension Mother    Hyperlipidemia Sister    Hypertension Sister    Depression Sister    Depression Brother    Sleep apnea Neg Hx    Social History Social History   Tobacco Use   Smoking status: Former    Packs/day: 2.00    Years: 20.00    Total pack years: 40.00    Types: Cigarettes    Quit  date: 10/29/2004    Years since quitting: 17.9    Passive exposure: Past   Smokeless tobacco: Never  Vaping Use   Vaping Use: Never used  Substance Use Topics   Alcohol use: Yes    Comment: rarely has a glass of wine   Drug use: Never   Allergies   Contrast media [iodinated contrast media], Cephalexin, Iodine, Clarithromycin, Duloxetine, Pregabalin, and Spironolactone-hctz  Review of Systems Review of Systems Pertinent findings revealed after performing a 14 point review of systems has been noted in the history of present illness.  Physical Exam Triage Vital Signs ED Triage Vitals  Enc Vitals Group     BP 08/01/21 0827 (!) 147/82     Pulse Rate 08/01/21 0827 72     Resp 08/01/21 0827 18     Temp 08/01/21 0827 98.3 F (36.8 C)     Temp Source 08/01/21 0827 Oral     SpO2 08/01/21 0827 98 %     Weight --      Height --      Head Circumference --      Peak Flow --      Pain Score 08/01/21 0826 5     Pain Loc --      Pain Edu? --      Excl. in [place]? --   No data found.  Updated Vital Signs BP (!) 142/84 (BP Location: Right Arm)   Pulse 97   Temp 97.9 F (36.6 C) (Oral)   Resp 16   SpO2 94%   Physical Exam Vitals and nursing note reviewed.  Constitutional:      General: She is not in acute distress.    Appearance: Normal appearance. She is not ill-appearing.  HENT:     Head: Normocephalic and atraumatic.     Salivary Glands: Right salivary gland is not diffusely enlarged or tender. Left salivary gland is not diffusely enlarged or tender.     Right Ear: Tympanic membrane, ear canal and external ear normal. No drainage. No middle ear effusion. There is no impacted cerumen. Tympanic membrane is not erythematous or bulging.     Left Ear: Ear canal and external ear normal. No drainage. A middle ear effusion is present. There is no impacted cerumen. Tympanic membrane is injected, erythematous and retracted. Tympanic membrane is not bulging.     Ears:     Comments:  Patient  wears hearing aids    Nose: Nose normal. No nasal deformity, septal deviation, mucosal edema, congestion or rhinorrhea.     Right Turbinates: Not enlarged, swollen or pale.     Left Turbinates: Not enlarged, swollen or pale.     Right Sinus: No maxillary sinus tenderness or frontal sinus tenderness.     Left Sinus: No maxillary sinus tenderness or frontal sinus tenderness.     Mouth/Throat:     Lips: Pink. No lesions.     Mouth: Mucous membranes are moist. No oral lesions.     Pharynx: Uvula midline. Pharyngeal swelling, posterior oropharyngeal erythema and uvula swelling present. No oropharyngeal exudate.     Tonsils: No tonsillar exudate. 0 on the right. 0 on the left.  Eyes:     General: Lids are normal.        Right eye: No discharge.        Left eye: No discharge.     Extraocular Movements: Extraocular movements intact.     Conjunctiva/sclera: Conjunctivae normal.     Right eye: Right conjunctiva is not injected.     Left eye: Left conjunctiva is not injected.  Neck:     Trachea: Trachea and phonation normal.  Cardiovascular:     Rate and Rhythm: Normal rate and regular rhythm.     Pulses: Normal pulses.     Heart sounds: Normal heart sounds. No murmur heard.    No friction rub. No gallop.  Pulmonary:     Effort: Pulmonary effort is normal. No accessory muscle usage, prolonged expiration or respiratory distress.     Breath sounds: Normal breath sounds. No stridor, decreased air movement or transmitted upper airway sounds. No decreased breath sounds, wheezing, rhonchi or rales.  Chest:     Chest wall: No tenderness.  Musculoskeletal:        General: Normal range of motion.     Cervical back: Normal range of motion and neck supple. Normal range of motion.  Lymphadenopathy:     Cervical: No cervical adenopathy.  Skin:    General: Skin is warm and dry.     Findings: No erythema or rash.  Neurological:     General: No focal deficit present.     Mental Status: She is alert and  oriented to person, place, and time.  Psychiatric:        Mood and Affect: Mood normal.        Behavior: Behavior normal.     Visual Acuity Right Eye Distance:   Left Eye Distance:   Bilateral Distance:    Right Eye Near:   Left Eye Near:    Bilateral Near:     UC Couse / Diagnostics / Procedures:     Radiology No results found.  Procedures Procedures (including critical care time) EKG  Pending results:  Labs Reviewed  CULTURE, GROUP A STREP [facility])  POCT RAPID STREP A (OFFICE)    Medications Ordered in UC: Medications - No data to display  UC Diagnoses / Final Clinical Impressions(s)   I have reviewed the triage vital signs and the nursing notes.  Pertinent labs & imaging results that were available during my care of the patient were reviewed by me and considered in my medical decision making (see chart for details).    Final diagnoses:  Acute pharyngitis, unspecified etiology  Acute suppurative otitis media of left ear  Acute bacterial sinusitis  Acute maxillary sinusitis, recurrence not specified   *** Please see discharge instructions  below for further details of plan of care as provided to patient. ED Prescriptions   None    PDMP not reviewed this encounter.  Disposition Upon Discharge:  Condition: stable for discharge home Home: take medications as prescribed; routine discharge instructions as discussed; follow up as advised.  Patient presented with an acute illness with associated systemic symptoms and significant discomfort requiring urgent management. In my opinion, this is a condition that a prudent lay person (someone who possesses an average knowledge of health and medicine) may potentially expect to result in complications if not addressed urgently such as respiratory distress, impairment of bodily function or dysfunction of bodily organs.   Routine symptom specific, illness specific and/or disease specific instructions were discussed with the  patient and/or caregiver at length.   As such, the patient has been evaluated and assessed, work-up was performed and treatment was provided in alignment with urgent care protocols and evidence based medicine.  Patient/parent/caregiver has been advised that the patient may require follow up for further testing and treatment if the symptoms continue in spite of treatment, as clinically indicated and appropriate.  If the patient was tested for COVID-19, Influenza and/or RSV, then the patient/parent/guardian was advised to isolate at home pending the results of his/her diagnostic coronavirus test and potentially longer if they're positive. I have also advised pt that if his/her COVID-19 test returns positive, it's recommended to self-isolate for at least 10 days after symptoms first appeared AND until fever-free for 24 hours without fever reducer AND other symptoms have improved or resolved. Discussed self-isolation recommendations as well as instructions for household member/close contacts as per the [facility] and  [facility], and also gave patient the [place] packet with this information.  Patient/parent/caregiver has been advised to return to the [facility] or PCP in 3-5 days if no better; to PCP or the Emergency Department if new signs and symptoms develop, or if the current signs or symptoms continue to change or worsen for further workup, evaluation and treatment as clinically indicated and appropriate  The patient will follow up with their current PCP if and as advised. If the patient does not currently have a PCP we will assist them in obtaining one.   The patient may need specialty follow up if the symptoms continue, in spite of conservative treatment and management, for further workup, evaluation, consultation and treatment as clinically indicated and appropriate.  Patient/parent/caregiver verbalized understanding and agreement of plan as discussed.  All questions were addressed during visit.  Please see discharge  instructions below for further details of plan.  Discharge Instructions:   Discharge Instructions      Your strep test today is negative.  Streptococcal throat culture will be performed per our protocol.  The result of your throat culture will be posted to your MyChart once it is complete, this typically takes 3 to 5 days.  If your streptococcal throat culture is positive, you will be contacted by phone.   Physical exam today revealed infectious material behind your left eardrum.  Because you are experiencing sinus pressure, I believe it is possible that you have a bacterial sinus infection as well and I recommend treatment with antibiotics for 10 days.  Please read below to learn more about the medications, dosages and frequencies that I recommend to help alleviate your symptoms and to get you feeling better soon:    Omnicef (cefdinir):  1 capsule twice daily for 10 days, you can take it with or without food.  This antibiotic can cause  upset stomach, this will resolve once antibiotics are complete.  You are welcome to use a probiotic, eat yogurt, take Imodium while taking this medication.  Please avoid other systemic medications such as Maalox, Pepto-Bismol or milk of magnesia as they can interfere with your body's ability to absorb the antibiotics.       Atrovent (ipratropium): This is an excellent nasal decongestant spray that does not cause rebound congestion, please instill 2 sprays into each nare with each use.  Please use the spray up to 4 times daily as needed.  I have provided you with a prescription for this medication.      Advil, Motrin (ibuprofen): This is a good anti-inflammatory medication which not only addresses aches, pains but also significantly reduces soft tissue inflammation of the upper airways that causes sinus and nasal congestion as well as inflammation of the lower airways which makes you feel like your breathing is constricted or your cough feel tight.  I recommend that  you take 400 mg every 8 hours as needed.      If you find that you have not had improvement of your symptoms in the next 5 to 7 days, please follow-up with your primary care provider or return here to urgent care for repeat evaluation and further recommendations.   Thank you for visiting urgent care today.  We appreciate the opportunity to participate in your care.       This office note has been dictated using Museum/gallery curator.  Unfortunately, this method of dictation can sometimes lead to typographical or grammatical errors.  I apologize for your inconvenience in advance if this occurs.  Please do not hesitate to reach out to me if clarification is needed.

## 2022-10-06 LAB — CULTURE, GROUP A STREP (THRC)

## 2022-10-08 ENCOUNTER — Ambulatory Visit (INDEPENDENT_AMBULATORY_CARE_PROVIDER_SITE_OTHER): Payer: Medicare Other | Admitting: Physician Assistant

## 2022-10-08 VITALS — BP 175/79 | HR 99 | Ht 65.0 in | Wt 200.0 lb

## 2022-10-08 DIAGNOSIS — N3946 Mixed incontinence: Secondary | ICD-10-CM

## 2022-10-08 DIAGNOSIS — R82998 Other abnormal findings in urine: Secondary | ICD-10-CM

## 2022-10-08 DIAGNOSIS — R32 Unspecified urinary incontinence: Secondary | ICD-10-CM | POA: Diagnosis not present

## 2022-10-08 DIAGNOSIS — R8281 Pyuria: Secondary | ICD-10-CM | POA: Diagnosis not present

## 2022-10-08 DIAGNOSIS — R3915 Urgency of urination: Secondary | ICD-10-CM

## 2022-10-08 DIAGNOSIS — N3000 Acute cystitis without hematuria: Secondary | ICD-10-CM

## 2022-10-08 DIAGNOSIS — R102 Pelvic and perineal pain: Secondary | ICD-10-CM

## 2022-10-08 LAB — URINALYSIS, COMPLETE
Bilirubin, UA: NEGATIVE
Glucose, UA: NEGATIVE
Ketones, UA: NEGATIVE
Nitrite, UA: NEGATIVE
RBC, UA: NEGATIVE
Specific Gravity, UA: 1.025 (ref 1.005–1.030)
Urobilinogen, Ur: 0.2 mg/dL (ref 0.2–1.0)
pH, UA: 5.5 (ref 5.0–7.5)

## 2022-10-08 LAB — MICROSCOPIC EXAMINATION

## 2022-10-08 LAB — BLADDER SCAN AMB NON-IMAGING: Scan Result: 0

## 2022-10-08 NOTE — Progress Notes (Signed)
10/08/2022 4:23 PM   Lisa Good [birth year] [id number]  CC: Chief Complaint  Patient presents with   Urinary Incontinence    Follow up    HPI: Lisa Good is a 72 y.o. female with PMH OAB wet with mixed urge and stress incontinence who underwent intravesical Botox with Dr. Matilde Sprang on 09/21/2022 who presents today for follow-up.   Today she reports her urinary symptoms initially improved following intravesical Botox, however about 1 week later she developed a return of urinary urgency, leakage, malodorous urine, and cloudy urine.  She also had some vulvar burning, which is typical for her at baseline.  She was concerned for possible UTI as well as upper respiratory infection and sought care at urgent care last week.  She was started on cefdinir 300 mg twice daily x 10 days to treat an ear infection and sinusitis.  She started antibiotics 2 days ago.  In-office UA today positive for trace protein and trace leukocytes; urine microscopy with 11-30 WBCs/HPF and calcium oxalate crystals.  PVR 0 mL.  PMH: Past Medical History:  Diagnosis Date   Allergy    seasonal   Arthritis    Cerebral aneurysm    Chronic kidney disease    Chronic pain syndrome    Complication of anesthesia    trouble waking up after back surgery   DDD (degenerative disc disease), lumbar    Depression    DVT (deep venous thrombosis) ([place]) 2002   Fibromyalgia    GERD (gastroesophageal reflux disease)    History of kidney stones    Hyperlipidemia    Hypertension    Iron deficiency anemia 11/01/2019   Irregular heart beat    Neuromuscular disorder (HCC)    peripheral neuropathy/feet   Obesity    Pancolitis (HCC)    Pre-diabetes    RLS (restless legs syndrome)    Sleep apnea    CPAP     Surgical History: Past Surgical History:  Procedure Laterality Date   BRAIN SURGERY  2002   aneurysm   CEREBRAL ANEURYSM REPAIR  2002   COLONOSCOPY WITH PROPOFOL N/A 11/10/2019   Procedure: COLONOSCOPY WITH  PROPOFOL;  Surgeon: Jonathon Bellows, MD;  Location: [facility];  Service: Endoscopy;  Laterality: N/A;   ESOPHAGOGASTRODUODENOSCOPY (EGD) WITH PROPOFOL N/A 11/10/2019   Procedure: ESOPHAGOGASTRODUODENOSCOPY (EGD) WITH PROPOFOL;  Surgeon: Jonathon Bellows, MD;  Location: [facility];  Service: Endoscopy;  Laterality: N/A;   EXTRACORPOREAL SHOCK WAVE LITHOTRIPSY Right 04/27/2019   Procedure: EXTRACORPOREAL SHOCK WAVE LITHOTRIPSY (ESWL);  Surgeon: Billey Co, MD;  Location: [facility];  Service: Urology;  Laterality: Right;   JOINT REPLACEMENT Right    knee   KNEE ARTHROPLASTY Left 01/12/2020   Procedure: COMPUTER ASSISTED TOTAL KNEE ARTHROPLASTY;  Surgeon: Dereck Leep, MD;  Location: [facility];  Service: Orthopedics;  Laterality: Left;   POLYPECTOMY  11/10/2019   Procedure: POLYPECTOMY;  Surgeon: Jonathon Bellows, MD;  Location: [facility];  Service: Endoscopy;;   REVERSE SHOULDER ARTHROPLASTY Right 01/06/2022   Procedure: REVERSE SHOULDER ARTHROPLASTY;  Surgeon: Corky Mull, MD;  Location: [facility];  Service: Orthopedics;  Laterality: Right;   SPINAL FUSION  07/10/2019   C4-5 corpectomy with C6-7 ACDF, C2-T3 spinal fusion   SPINE SURGERY     spinal fusion   TUBAL LIGATION      Home Medications:  Allergies as of 10/08/2022       Reactions   Contrast Media [iodinated Contrast Media] Hives   Cephalexin Nausea Only  Iodine Hives   Clarithromycin Nausea Only   Duloxetine Swelling   Legs and feet   Pregabalin Swelling   Legs and feet   Spironolactone-hctz Rash   Only with iv injection        Medication List        Accurate as of October 08, 2022  4:23 PM. If you have any questions, ask your nurse or doctor.          Alpha-Lipoic Acid 600 MG Caps Take 600 mg by mouth 2 (two) times daily.   amLODipine 10 MG tablet Commonly known as: NORVASC Take 10 mg by mouth at bedtime.   atorvastatin 40 MG tablet Commonly known as: LIPITOR Take 40 mg by mouth every  morning.   cefdinir 300 MG capsule Commonly known as: OMNICEF Take 1 capsule (300 mg total) by mouth 2 (two) times daily for 10 days.   docusate sodium 50 MG capsule Commonly known as: COLACE Take 50 mg by mouth every evening.   famotidine 20 MG tablet Commonly known as: PEPCID Take 20 mg by mouth daily as needed for heartburn or indigestion.   gabapentin 800 MG tablet Commonly known as: NEURONTIN Take 1 tablet (800 mg total) by mouth 4 (four) times daily.   ibuprofen 200 MG tablet Commonly known as: ADVIL Take 400-600 mg by mouth as needed for moderate pain.   ipratropium 0.06 % nasal spray Commonly known as: ATROVENT Place 2 sprays into both nostrils 3 (three) times daily. As needed for nasal congestion, runny nose   lisinopril 40 MG tablet Commonly known as: ZESTRIL Take 40 mg by mouth every morning.   OVER THE COUNTER MEDICATION Apply 1 application. topically 3 (three) times daily as needed (pain). Outback original pain relieving oil, apply to feet   oxyCODONE-acetaminophen 10-325 MG tablet Commonly known as: PERCOCET Take 1 tablet by mouth 4 (four) times daily.   pantoprazole 40 MG tablet Commonly known as: PROTONIX Take 40 mg by mouth 2 (two) times daily.   traZODone 100 MG tablet Commonly known as: DESYREL Take 1 tablet by mouth at bedtime.   venlafaxine XR 150 MG 24 hr capsule Commonly known as: EFFEXOR-XR Take 150 mg by mouth daily with breakfast. Take with 75 mg to equal 225 mg daily   venlafaxine XR 75 MG 24 hr capsule Commonly known as: EFFEXOR-XR Take 75 mg by mouth daily with breakfast. Take with 150 mg to equal 225 mg daily   Vitamin D3 25 MCG (1000 UT) Caps Take 1,000 Units by mouth daily.   XYLITOL MT Use as directed 2 tablets in the mouth or throat daily as needed (dry mouth).        Allergies:  Allergies  Allergen Reactions   Contrast Media [Iodinated Contrast Media] Hives   Cephalexin Nausea Only   Iodine Hives   Clarithromycin  Nausea Only   Duloxetine Swelling    Legs and feet   Pregabalin Swelling    Legs and feet   Spironolactone-Hctz Rash    Only with iv injection    Family History: Family History  Problem Relation Age of Onset   Hypertension Mother    Hyperlipidemia Sister    Hypertension Sister    Depression Sister    Depression Brother    Sleep apnea Neg Hx     Social History:   reports that she quit smoking about 17 years ago. Her smoking use included cigarettes. She has a 40.00 pack-year smoking history. She has been exposed to tobacco  smoke. She has never used smokeless tobacco. She reports current alcohol use. She reports that she does not use drugs.  Physical Exam: BP (!) 175/79   Pulse 99   Ht '5\' 5"'$  (1.651 m)   Wt 200 lb (90.7 kg)   BMI 33.28 kg/m   Constitutional:  Alert and oriented, no acute distress, nontoxic appearing HEENT: [place], AT Cardiovascular: No clubbing, cyanosis, or edema Respiratory: Normal respiratory effort, no increased work of breathing Skin: No rashes, bruises or suspicious lesions Neurologic: Grossly intact, no focal deficits, moving all 4 extremities Psychiatric: Normal mood and affect  Laboratory Data: Results for orders placed or performed in visit on 10/08/22  Microscopic Examination   Urine  Result Value Ref Range   WBC, UA 11-30 (A) 0 - 5 /hpf   RBC, Urine 0-2 0 - 2 /hpf   Epithelial Cells (non renal) 0-10 0 - 10 /hpf   Crystals Present (A) N/A   Crystal Type Calcium Oxalate N/A   Bacteria, UA Few None seen/Few  Urinalysis, Complete  Result Value Ref Range   Specific Gravity, UA 1.025 1.005 - 1.030   pH, UA 5.5 5.0 - 7.5   Color, UA Yellow Yellow   Appearance Ur Clear Clear   Leukocytes,UA Trace (A) Negative   Protein,UA Trace (A) Negative/Trace   Glucose, UA Negative Negative   Ketones, UA Negative Negative   RBC, UA Negative Negative   Bilirubin, UA Negative Negative   Urobilinogen, Ur 0.2 0.2 - 1.0 mg/dL   Nitrite, UA Negative Negative    Microscopic Examination See below:   Bladder Scan (Post Void Residual) in office  Result Value Ref Range   Scan Result 0    Assessment & Plan:   1. Mixed incontinence She is emptying appropriately following intravesical Botox.  Difficult to appreciate symptomatic improvement due to #2 below. - Bladder Scan (Post Void Residual) in office  2. Acute cystitis without hematuria Her UA today is notable for pyuria and she is already on cefdinir.  I think she may have acute cystitis, which is already being treated by the cephalosporin.  I counseled her to complete the cefdinir as prescribed and will obtain a urine culture today, though I suspect this will be negative in the setting of antibiotic use.  Will have her follow-up with Dr. Matilde Sprang in about 1 month for a symptom recheck to see if the Botox improved her baseline OAB when she is infection free. - Urinalysis, Complete - CULTURE, URINE COMPREHENSIVE  Return in about 4 weeks (around 11/05/2022) for Symptom recheck with Dr. Matilde Sprang.  Debroah Loop, PA-C  [facility] [address], [place], [place] [address] [phone]

## 2022-10-15 ENCOUNTER — Other Ambulatory Visit: Payer: Self-pay | Admitting: *Deleted

## 2022-10-15 ENCOUNTER — Other Ambulatory Visit: Payer: Self-pay | Admitting: Physician Assistant

## 2022-10-15 LAB — CULTURE, URINE COMPREHENSIVE

## 2022-10-15 MED ORDER — FOSFOMYCIN TROMETHAMINE 3 G PO PACK: 3.0000 g | PACK | Freq: Once | ORAL | 0 refills | Status: AC

## 2022-10-16 NOTE — Telephone Encounter (Signed)
Patient advised and [place] coupon information given to the patient. Pharmacy advised also and they will run it cash price

## 2022-10-22 LAB — HM MAMMOGRAPHY

## 2022-11-05 DIAGNOSIS — N390 Urinary tract infection, site not specified: Secondary | ICD-10-CM

## 2022-11-05 HISTORY — DX: Urinary tract infection, site not specified: N39.0

## 2022-11-09 ENCOUNTER — Encounter: Payer: Self-pay | Admitting: Urology

## 2022-11-09 ENCOUNTER — Ambulatory Visit (INDEPENDENT_AMBULATORY_CARE_PROVIDER_SITE_OTHER): Payer: Medicare Other | Admitting: Urology

## 2022-11-09 VITALS — BP 165/97 | HR 98 | Ht 65.0 in | Wt 200.0 lb

## 2022-11-09 DIAGNOSIS — N3946 Mixed incontinence: Secondary | ICD-10-CM

## 2022-11-09 LAB — MICROSCOPIC EXAMINATION

## 2022-11-09 LAB — URINALYSIS, COMPLETE
Bilirubin, UA: NEGATIVE
Glucose, UA: NEGATIVE
Ketones, UA: NEGATIVE
Nitrite, UA: NEGATIVE
RBC, UA: NEGATIVE
Specific Gravity, UA: 1.02 (ref 1.005–1.030)
Urobilinogen, Ur: 0.2 mg/dL (ref 0.2–1.0)
pH, UA: 5.5 (ref 5.0–7.5)

## 2022-11-09 NOTE — Progress Notes (Signed)
11/09/2022 3:48 PM   Lisa Good Nov 07, 1950 [id number]  Referring provider: [name], MD [facility] [place],  [place] [address]  Chief Complaint  Patient presents with   Follow-up   Urinary Incontinence    HPI: I was consulted to assess the patient is urinary incontinence.  She has had 2 synthetic slings and one pubovaginal sling in [place].  She might leak with coughing sneezing with a hard cough but otherwise has no stress incontinence.  She is urge incontinence worse when she goes from a supine to standing position she can soak a pad.  She wears a 2 pads a day.  The positional changes significant trigger.  No bedwetting   She voids every 3-4 hours gets up twice at night   She recently had neck surgery and needs another back operation.  She has had 2 bladder infections in the last 6 months.   By history she recently failed Ditropan and Myrbetriq      well supported bladder neck and negative cough test.  Patient had about a 5 cm vaginal length.  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  I felt some scarring near the cuff.  She had no stress incontinence   Likely patient likely has primarily an overactive bladder with triggering and possibly mild stress incontinence.     Patient had back surgery and did not follow-up.  Last appointment more than a year ago.  She has had surgery on her neck mid back and low back.   She has urge incontinence.  Main symptom is she has high-volume leakage when she goes from a supine to standing position.  She leaks a small amount with coughing sneezing.  No bedwetting. She wears 2 pads a day with a varying amount of leakage   Voids every 2-3 hours gets up 3-4 times a night.   Cystoscopy: normal   Today Patient voided 63 mL during urodynamics with a maximum flow 11 mils per second and had a residual of 100 mL.  Maximum bladder capacity was 237 mL.  Bladder was unstable reaching pressure 4 cm  of water.  She felt urgency and leaked.  At 200 mL she had mild leakage at 38 cm of water.  With a Valsalva maneuver at the same volume she leaked a mild amount at 70 cm of water.  At 263 mL she had moderate severe leakage at 64 cm of water.  During voluntary voiding she voided 247 mL with a max of flow 12 mils per second.  Maximum voiding pressure was 31 cm water.  She emptied well with a residual 26 mL.  EMG activity increased during voiding.  She did have a lot of urgency during the study.     Patient has mixed incontinence.  Approximately 80% of her voiding dysfunction is due to an overactive bladder with urge incontinence.  She has very mild stress incontinence.  I will see her back on the new beta 3 agonist.  If this fails I will offer all 4 third line therapies to her.    Patient has peripheral neuropathy   80% improved on the new beta 3 agonist.  Not sure if it aggravated an upper respiratory tract infection and not related to vague abdominal discomfort in my opinion   Patient dramatically better with 80 to 90% reduction urge incontinence improving quality life new beta 3 agonist.    Today Patient cannot afford the full price Gemtesa.  Tolterodine is approximately $80 per month and not working well.  Clinically not infected.  Frequency stable.   Went over all 3 refractory treatments with full templates.   Patient would like to proceed with Botox. 3 days ciprofloxacin prescription given. She lives in [place] [facility] and does not want to do percutaneous tibial nerve stimulation. She has had a lot of back surgery.      Today Frequency stable.  Incontinence stable.  Clinically not infected.  Took ciprofloxacin as per protocol Significant worsening of vagina Cystoscopy: Patient underwent flexible cystoscopy.  Bladder mucosa and trigone were normal.  No cystitis.  I injected 100 units of Botox and 10 cc of normal saline with more of the modified midline and low midline template and to the patient's  left side.  Minimal bleeding.  She was a bit uncomfortable but did very well    They Botox treatment was September 21, 2022.  She was doing reasonably well but in January 4 she presented with cystitis.  Residual was 0 mL.  Culture was positive He has good days and bad days with a 40% improvement.  Sometimes 2 pads other x 8 pads.  Urine a bit cloudy but no pain We decided to do another Botox in about 8 to 10 weeks.  We will relook at its efficacy and may consider other therapies like InterStim in the future.  She just had a back stimulator.  Call if culture positive    PMH: Past Medical History:  Diagnosis Date   Allergy    seasonal   Arthritis    Cerebral aneurysm    Chronic kidney disease    Chronic pain syndrome    Complication of anesthesia    trouble waking up after back surgery   DDD (degenerative disc disease), lumbar    Depression    DVT (deep venous thrombosis) ([place]) 2002   Fibromyalgia    GERD (gastroesophageal reflux disease)    History of kidney stones    Hyperlipidemia    Hypertension    Iron deficiency anemia 11/01/2019   Irregular heart beat    Neuromuscular disorder (HCC)    peripheral neuropathy/feet   Obesity    Pancolitis (HCC)    Pre-diabetes    RLS (restless legs syndrome)    Sleep apnea    CPAP     Surgical History: Past Surgical History:  Procedure Laterality Date   BRAIN SURGERY  2002   aneurysm   CEREBRAL ANEURYSM REPAIR  2002   COLONOSCOPY WITH PROPOFOL N/A 11/10/2019   Procedure: COLONOSCOPY WITH PROPOFOL;  Surgeon: Jonathon Bellows, MD;  Location: [facility];  Service: Endoscopy;  Laterality: N/A;   ESOPHAGOGASTRODUODENOSCOPY (EGD) WITH PROPOFOL N/A 11/10/2019   Procedure: ESOPHAGOGASTRODUODENOSCOPY (EGD) WITH PROPOFOL;  Surgeon: Jonathon Bellows, MD;  Location: [facility];  Service: Endoscopy;  Laterality: N/A;   EXTRACORPOREAL SHOCK WAVE LITHOTRIPSY Right 04/27/2019   Procedure: EXTRACORPOREAL SHOCK WAVE LITHOTRIPSY (ESWL);   Surgeon: Billey Co, MD;  Location: [facility];  Service: Urology;  Laterality: Right;   JOINT REPLACEMENT Right    knee   KNEE ARTHROPLASTY Left 01/12/2020   Procedure: COMPUTER ASSISTED TOTAL KNEE ARTHROPLASTY;  Surgeon: Dereck Leep, MD;  Location: [facility];  Service: Orthopedics;  Laterality: Left;   POLYPECTOMY  11/10/2019   Procedure: POLYPECTOMY;  Surgeon: Jonathon Bellows, MD;  Location: [facility] [place];  Service: Endoscopy;;   REVERSE SHOULDER ARTHROPLASTY Right 01/06/2022   Procedure: REVERSE SHOULDER ARTHROPLASTY;  Surgeon: Corky Mull, MD;  Location: [facility];  Service: Orthopedics;  Laterality: Right;   SPINAL FUSION  07/10/2019   C4-5 corpectomy with C6-7 ACDF, C2-T3 spinal fusion   SPINE SURGERY     spinal fusion   TUBAL LIGATION      Home Medications:  Allergies as of 11/09/2022       Reactions   Contrast Media [iodinated Contrast Media] Hives   Cephalexin Nausea Only   Iodine Hives   Clarithromycin Nausea Only   Duloxetine Swelling   Legs and feet   Pregabalin Swelling   Legs and feet   Spironolactone-hctz Rash   Only with iv injection        Medication List        Accurate as of November 09, 2022  3:48 PM. If you have any questions, ask your nurse or doctor.          Alpha-Lipoic Acid 600 MG Caps Take 600 mg by mouth 2 (two) times daily.   amLODipine 10 MG tablet Commonly known as: NORVASC Take 10 mg by mouth at bedtime.   atorvastatin 40 MG tablet Commonly known as: LIPITOR Take 40 mg by mouth every morning.   docusate sodium 50 MG capsule Commonly known as: COLACE Take 50 mg by mouth every evening.   famotidine 20 MG tablet Commonly known as: PEPCID Take 20 mg by mouth daily as needed for heartburn or indigestion.   gabapentin 800 MG tablet Commonly known as: NEURONTIN Take 1 tablet (800 mg total) by mouth 4 (four) times daily.   ibuprofen 200 MG tablet Commonly known as: ADVIL Take 400-600 mg by mouth as needed for moderate  pain.   ipratropium 0.06 % nasal spray Commonly known as: ATROVENT Place 2 sprays into both nostrils 3 (three) times daily. As needed for nasal congestion, runny nose   lisinopril 40 MG tablet Commonly known as: ZESTRIL Take 40 mg by mouth every morning.   OVER THE COUNTER MEDICATION Apply 1 application. topically 3 (three) times daily as needed (pain). Outback original pain relieving oil, apply to feet   oxyCODONE-acetaminophen 10-325 MG tablet Commonly known as: PERCOCET Take 1 tablet by mouth 4 (four) times daily.   pantoprazole 40 MG tablet Commonly known as: PROTONIX Take 40 mg by mouth 2 (two) times daily.   traZODone 100 MG tablet Commonly known as: DESYREL Take 1 tablet by mouth at bedtime.   venlafaxine XR 150 MG 24 hr capsule Commonly known as: EFFEXOR-XR Take 150 mg by mouth daily with breakfast. Take with 75 mg to equal 225 mg daily   venlafaxine XR 75 MG 24 hr capsule Commonly known as: EFFEXOR-XR Take 75 mg by mouth daily with breakfast. Take with 150 mg to equal 225 mg daily   Vitamin D3 25 MCG (1000 UT) Caps Take 1,000 Units by mouth daily.   XYLITOL MT Use as directed 2 tablets in the mouth or throat daily as needed (dry mouth).        Allergies:  Allergies  Allergen Reactions   Contrast Media [Iodinated Contrast Media] Hives   Cephalexin Nausea Only   Iodine Hives   Clarithromycin Nausea Only   Duloxetine Swelling    Legs and feet   Pregabalin Swelling    Legs and feet   Spironolactone-Hctz Rash    Only with iv injection    Family History: Family History  Problem Relation Age of Onset   Hypertension Mother    Hyperlipidemia Sister    Hypertension Sister    Depression Sister  Depression Brother    Sleep apnea Neg Hx     Social History:  reports that she quit smoking about 18 years ago. Her smoking use included cigarettes. She has a 40.00 pack-year smoking history. She has been exposed to tobacco smoke. She has never used  smokeless tobacco. She reports current alcohol use. She reports that she does not use drugs.  ROS:                                        Physical Exam: There were no vitals taken for this visit.  Constitutional:  Alert and oriented, No acute distress. HEENT: [place] AT, moist mucus membranes.  Trachea midline, no masses.   Laboratory Data: Lab Results  Component Value Date   WBC 6.1 03/31/2022   HGB 11.9 (L) 03/31/2022   HCT 38.6 03/31/2022   MCV 81.4 03/31/2022   PLT 260 03/31/2022    Lab Results  Component Value Date   CREATININE 0.96 03/31/2022    No results found for: "PSA"  No results found for: "TESTOSTERONE"  No results found for: "HGBA1C"  Urinalysis    Component Value Date/Time   COLORURINE YELLOW (A) 12/31/2021 1106   APPEARANCEUR Clear 10/08/2022 1107   LABSPEC 1.025 12/31/2021 1106   PHURINE 5.0 12/31/2021 1106   GLUCOSEU Negative 10/08/2022 1107   HGBUR NEGATIVE 12/31/2021 1106   BILIRUBINUR Negative 10/08/2022 1107   KETONESUR trace (5) (A) 07/26/2022 1550   KETONESUR NEGATIVE 12/31/2021 1106   PROTEINUR Trace (A) 10/08/2022 1107   PROTEINUR NEGATIVE 12/31/2021 1106   UROBILINOGEN 0.2 07/26/2022 1550   NITRITE Negative 10/08/2022 1107   NITRITE NEGATIVE 12/31/2021 1106   LEUKOCYTESUR Trace (A) 10/08/2022 1107   LEUKOCYTESUR MODERATE (A) 12/31/2021 1106    Pertinent Imaging:   Assessment & Plan: Botox in 8 to 10 weeks.  Logan Bores was approximately thousand dollars  There are no diagnoses linked to this encounter.  No follow-ups on file.  Reece Packer, MD  [place] [address] [place], [place] [address] [phone]

## 2022-11-11 ENCOUNTER — Telehealth: Payer: Self-pay | Admitting: *Deleted

## 2022-11-11 LAB — CULTURE, URINE COMPREHENSIVE

## 2022-11-11 NOTE — Telephone Encounter (Signed)
-----   Message from [place] sent at 11/09/2022  4:03 PM EST ----- Regarding: botox Botox in 10 weeks

## 2022-11-12 ENCOUNTER — Telehealth: Payer: Self-pay | Admitting: *Deleted

## 2022-11-12 NOTE — Telephone Encounter (Signed)
-----   Message from Bjorn Loser, MD sent at 11/12/2022 11:39 AM EST ----- Macrodantin 100 mg bid for 7 days She will need c/s prior to next botox thanks ----- Message ----- From: Despina Hidden, CMA Sent: 11/11/2022   1:40 PM EST To: Bjorn Loser, MD   ----- Message ----- From: Interface, [facility] Lab Results In Sent: 11/09/2022   4:36 PM EST To: [facility]

## 2022-11-12 NOTE — Telephone Encounter (Signed)
Sent to [facility] for verification

## 2022-11-12 NOTE — Telephone Encounter (Signed)
Spoke with patient about starting prescription for macrobid and pt states she is taking keflex for a spinal procedure. Ok to start macrobid?  Diagnoses  Post laminectomy syndrome  post laminectomy syndrome    Procedures  IMPLANT NEUROELECTRODES  PR ELEC ALYS IMPLT NPGT [facility] SP/PN NPGT [facility]  SPINAL CORD STIMULATOR PART 1/[employer]/Trial

## 2022-11-13 NOTE — Telephone Encounter (Signed)
Spoke with patient and advised results   

## 2022-11-16 ENCOUNTER — Telehealth: Payer: Self-pay

## 2022-11-16 MED ORDER — NITROFURANTOIN MONOHYD MACRO 100 MG PO CAPS: 100.0000 mg | ORAL_CAPSULE | Freq: Two times a day (BID) | ORAL | 0 refills | Status: AC

## 2022-11-16 NOTE — Telephone Encounter (Signed)
Call rec'ed via triage line  Pt states med was suppose to be called in on Friday. Pharmacy has no record. It is a ATB. Per telephone encounter from [facility] pt needs Macrodantin 114m bid x 7.  Per DRidgelandshe will send in rx.

## 2022-11-16 NOTE — Addendum Note (Signed)
Addended by: Despina Hidden on: 11/16/2022 01:43 PM   Modules accepted: Orders

## 2022-11-16 NOTE — Telephone Encounter (Signed)
rx sent to pharmacy by e-script  

## 2022-11-16 NOTE — Telephone Encounter (Signed)
Spoke with patient, no prior auth needed per botox benefits verification ( scanned in chart)  Scheduled lab Scheduled botox Will send in cipro per protocol

## 2022-11-17 ENCOUNTER — Telehealth: Payer: Self-pay

## 2022-11-17 NOTE — Telephone Encounter (Signed)
Lisa Good with [employer] contacted me. This patient completed a spinal cord stimulator trial at [facility] and would like to see Dr Izora Ribas for permanent implant.  Per notes from [place] in Care Everywhere: Trial 11/06/22-11/12/22 with 50-60% relief of her neuropathic foot pain  Thoracic MRI 02/02/21 and is in [facility] system Lumbar MRI 04/23/20 and is in [facility] system  Previously saw Dr Izora Ribas at [facility]  Can have a new patient appointment with Dr Izora Ribas once we receive a copy of her psych eval. I have asked Lisa Good to assist [place] with obtaining a copy of this.

## 2022-11-19 NOTE — Telephone Encounter (Signed)
Psych evaluation scanned into her chart.

## 2022-11-20 NOTE — Telephone Encounter (Signed)
She is scheduled for 12/01/2022.

## 2022-11-23 ENCOUNTER — Other Ambulatory Visit: Payer: Self-pay

## 2022-11-23 ENCOUNTER — Ambulatory Visit
Admission: RE | Admit: 2022-11-23 | Discharge: 2022-11-23 | Disposition: A | Discharge status: Home / Self Care | Payer: Self-pay | Source: Ambulatory Visit | Attending: Neurosurgery | Admitting: Neurosurgery

## 2022-11-23 DIAGNOSIS — Z049 Encounter for examination and observation for unspecified reason: Secondary | ICD-10-CM

## 2022-11-25 ENCOUNTER — Encounter: Payer: Self-pay | Admitting: Neurosurgery

## 2022-11-26 ENCOUNTER — Telehealth: Payer: Self-pay

## 2022-11-26 NOTE — Telephone Encounter (Signed)
Patient calls triage line stating that she is still experiencing UTI symptoms after completing Macrobid. See last culture below, please advise.     ANTIMICROBIAL SUSCEPTIBILITY Comment  Comment:       ** S = Susceptible; I = Intermediate; R = Resistant **                    P = Positive; N = Negative             MICS are expressed in micrograms per mL    Antibiotic                 RSLT#1    RSLT#2    RSLT#3    RSLT#4 Ciprofloxacin                 R Levofloxacin                  R Nitrofurantoin                 I Penicillin                         R Tetracycline                    R Vancomycin                    S

## 2022-11-30 NOTE — H&P (View-Only) (Signed)
  Referring Physician:  No referring provider defined for this encounter.  Primary Physician:  McLaughlin, Miriam K, MD  History of Present Illness: 12/01/2022 [name] is here today with a chief complaint of chronic pain.  She had a spinal cord stimulator trial with [employer] where she had greater than 50% relief of her neuropathic foot pain.  She has also been cleared by Psychology.   Past Surgery: L2-S1 fusion in August 2017  07/10/2019: C4-5 corpectomy with C6-7 ACDF, C2-T3 spinal fusion  Thoracolumbar fusion in 2021   Lisa Good has no symptoms of cervical myelopathy.  The symptoms are causing a significant impact on the patient's life.   I have utilized the care everywhere function in epic to review the outside records available from external health systems.  Review of Systems:  A 10 point review of systems is negative, except for the pertinent positives and negatives detailed in the HPI.  Past Medical History: Past Medical History:  Diagnosis Date   Allergy    seasonal   Arthritis    Cerebral aneurysm    Chronic kidney disease    Chronic pain syndrome    Complication of anesthesia    trouble waking up after back surgery   DDD (degenerative disc disease), lumbar    Depression    DVT (deep venous thrombosis) (HCC) 2002   Fibromyalgia    GERD (gastroesophageal reflux disease)    History of kidney stones    Hyperlipidemia    Hypertension    Iron deficiency anemia 11/01/2019   Irregular heart beat    Neuromuscular disorder (HCC)    peripheral neuropathy/feet   Obesity    Pancolitis (HCC)    Pre-diabetes    RLS (restless legs syndrome)    Sleep apnea    CPAP     Past Surgical History: Past Surgical History:  Procedure Laterality Date   BRAIN SURGERY  2002   aneurysm   CEREBRAL ANEURYSM REPAIR  2002   COLONOSCOPY WITH PROPOFOL N/A 11/10/2019   Procedure: COLONOSCOPY WITH PROPOFOL;  Surgeon: Anna, Kiran, MD;  Location: [facility];  Service: Endoscopy;  Laterality: N/A;   ESOPHAGOGASTRODUODENOSCOPY (EGD) WITH PROPOFOL N/A 11/10/2019   Procedure: ESOPHAGOGASTRODUODENOSCOPY (EGD) WITH PROPOFOL;  Surgeon: Anna, Kiran, MD;  Location: [facility];  Service: Endoscopy;  Laterality: N/A;   EXTRACORPOREAL SHOCK WAVE LITHOTRIPSY Right 04/27/2019   Procedure: EXTRACORPOREAL SHOCK WAVE LITHOTRIPSY (ESWL);  Surgeon: Sninsky, Brian C, MD;  Location: [facility];  Service: Urology;  Laterality: Right;   JOINT REPLACEMENT Right    knee   KNEE ARTHROPLASTY Left 01/12/2020   Procedure: COMPUTER ASSISTED TOTAL KNEE ARTHROPLASTY;  Surgeon: Hooten, James P, MD;  Location: [facility];  Service: Orthopedics;  Laterality: Left;   POLYPECTOMY  11/10/2019   Procedure: POLYPECTOMY;  Surgeon: Anna, Kiran, MD;  Location: [facility];  Service: Endoscopy;;   REVERSE SHOULDER ARTHROPLASTY Right 01/06/2022   Procedure: REVERSE SHOULDER ARTHROPLASTY;  Surgeon: Poggi, John J, MD;  Location: [facility];  Service: Orthopedics;  Laterality: Right;   SPINAL FUSION  07/10/2019   C4-5 corpectomy with C6-7 ACDF, C2-T3 spinal fusion   SPINE SURGERY     spinal fusion   TUBAL LIGATION      Allergies: Allergies as of 12/01/2022 - Review Complete 12/01/2022  Allergen Reaction Noted   Contrast media [iodinated contrast media] Hives 11/09/2018   Cephalexin Nausea Only and Other (See Comments) 01/28/2018   Iodine Hives 03/31/2022   Duloxetine Swelling 12/31/2021     Pregabalin Swelling 12/31/2021   Clarithromycin Nausea Only and Other (See Comments) 03/07/2011   Spironolactone-hctz Rash 12/31/2021    Medications: No outpatient medications have been marked as taking for the 12/01/22 encounter (Office Visit) with Alysha Doolan, MD.    Social History: Social History   Tobacco Use   Smoking status: Former    Packs/day: 2.00    Years: 20.00    Total pack years: 40.00    Types: Cigarettes    Quit date: 10/29/2004    Years since  quitting: 18.1    Passive exposure: Past   Smokeless tobacco: Never  Vaping Use   Vaping Use: Never used  Substance Use Topics   Alcohol use: Yes    Comment: rarely has a glass of wine   Drug use: Never    Family Medical History: Family History  Problem Relation Age of Onset   Hypertension Mother    Hyperlipidemia Sister    Hypertension Sister    Depression Sister    Depression Brother    Sleep apnea Neg Hx     Physical Examination: Vitals:   12/01/22 1404  BP: 131/70  Pulse: 86    General: Patient is well developed, well nourished, calm, collected, and in no apparent distress. Attention to examination is appropriate.  Neck:   Supple.  Full range of motion.  Respiratory: Patient is breathing without any difficulty.   NEUROLOGICAL:     Awake, alert, oriented to person, place, and time.  Speech is clear and fluent.   Cranial Nerves: Pupils equal round and reactive to light.  Facial tone is symmetric.  Facial sensation is symmetric. Shoulder shrug is symmetric. Tongue protrusion is midline.  There is no pronator drift.  ROM of spine: full.    Strength: Side Biceps Triceps Deltoid Interossei Grip Wrist Ext. Wrist Flex.  R 5 5 5 5 5 5 5  L 5 5 5 5 5 5 5   Side Iliopsoas Quads Hamstring PF DF EHL  R 5 5 5 5 5 5  L 5 5 5 5 5 5   Reflexes are 1+ and symmetric at the biceps, triceps, brachioradialis, patella and achilles.   Hoffman's is absent.   Bilateral upper and lower extremity sensation is intact to light touch.    No evidence of dysmetria noted.  Gait is normal.     Medical Decision Making  Imaging: Thoracic spine MRI scan reviewed.  I have personally reviewed the images and agree with the above interpretation.  Assessment and Plan: [name] is a pleasant 72 y.o. female with chronic pain syndrome and diabetic foot pain.  She had a successful spinal cord stimulator trial.  I think she is a good candidate for permanent implantation of a [employer] spinal cord stimulator and internal pulse generator.  I discussed the planned procedure at length with the patient, including the risks, benefits, alternatives, and indications. The risks discussed include but are not limited to bleeding, infection, need for reoperation, spinal fluid leak, stroke, vision loss, anesthetic complication, coma, paralysis, and even death. I also described in detail that improvement was not guaranteed.  The patient expressed understanding of these risks, and asked that we proceed with surgery. I described the surgery in layman's terms, and gave ample opportunity for questions, which were answered to the best of my ability.     Thank you for involving me in the care of this patient.      Tyleek Smick K. Renezmae Canlas MD, MPHS Neurosurgery  

## 2022-11-30 NOTE — Progress Notes (Unsigned)
Referring Physician:  No referring provider defined for this encounter.  Primary Physician:  [name], MD  History of Present Illness: 12/01/2022 Lisa Good is here today with a chief complaint of chronic pain.  She had a spinal cord stimulator trial with [employer] where she had greater than 50% relief of her neuropathic foot pain.  She has also been cleared by Psychology.   Past Surgery: L2-S1 fusion in August 2017  07/10/2019: C4-5 corpectomy with C6-7 ACDF, C2-T3 spinal fusion  Thoracolumbar fusion in 2021   Lisa Good has no symptoms of cervical myelopathy.  The symptoms are causing a significant impact on the patient's life.   I have utilized the care everywhere function in epic to review the outside records available from external health systems.  Review of Systems:  A 10 point review of systems is negative, except for the pertinent positives and negatives detailed in the HPI.  Past Medical History: Past Medical History:  Diagnosis Date   Allergy    seasonal   Arthritis    Cerebral aneurysm    Chronic kidney disease    Chronic pain syndrome    Complication of anesthesia    trouble waking up after back surgery   DDD (degenerative disc disease), lumbar    Depression    DVT (deep venous thrombosis) ([place]) 2002   Fibromyalgia    GERD (gastroesophageal reflux disease)    History of kidney stones    Hyperlipidemia    Hypertension    Iron deficiency anemia 11/01/2019   Irregular heart beat    Neuromuscular disorder (HCC)    peripheral neuropathy/feet   Obesity    Pancolitis (HCC)    Pre-diabetes    RLS (restless legs syndrome)    Sleep apnea    CPAP     Past Surgical History: Past Surgical History:  Procedure Laterality Date   BRAIN SURGERY  2002   aneurysm   CEREBRAL ANEURYSM REPAIR  2002   COLONOSCOPY WITH PROPOFOL N/A 11/10/2019   Procedure: COLONOSCOPY WITH PROPOFOL;  Surgeon: Jonathon Bellows, MD;  Location: [facility];  Service: Endoscopy;  Laterality: N/A;   ESOPHAGOGASTRODUODENOSCOPY (EGD) WITH PROPOFOL N/A 11/10/2019   Procedure: ESOPHAGOGASTRODUODENOSCOPY (EGD) WITH PROPOFOL;  Surgeon: Jonathon Bellows, MD;  Location: [facility];  Service: Endoscopy;  Laterality: N/A;   EXTRACORPOREAL SHOCK WAVE LITHOTRIPSY Right 04/27/2019   Procedure: EXTRACORPOREAL SHOCK WAVE LITHOTRIPSY (ESWL);  Surgeon: Billey Co, MD;  Location: [facility];  Service: Urology;  Laterality: Right;   JOINT REPLACEMENT Right    knee   KNEE ARTHROPLASTY Left 01/12/2020   Procedure: COMPUTER ASSISTED TOTAL KNEE ARTHROPLASTY;  Surgeon: Dereck Leep, MD;  Location: [facility];  Service: Orthopedics;  Laterality: Left;   POLYPECTOMY  11/10/2019   Procedure: POLYPECTOMY;  Surgeon: Jonathon Bellows, MD;  Location: [facility];  Service: Endoscopy;;   REVERSE SHOULDER ARTHROPLASTY Right 01/06/2022   Procedure: REVERSE SHOULDER ARTHROPLASTY;  Surgeon: Corky Mull, MD;  Location: [facility];  Service: Orthopedics;  Laterality: Right;   SPINAL FUSION  07/10/2019   C4-5 corpectomy with C6-7 ACDF, C2-T3 spinal fusion   SPINE SURGERY     spinal fusion   TUBAL LIGATION      Allergies: Allergies as of 12/01/2022 - Review Complete 12/01/2022  Allergen Reaction Noted   Contrast media [iodinated contrast media] Hives 11/09/2018   Cephalexin Nausea Only and Other (See Comments) 01/28/2018   Iodine Hives 03/31/2022   Duloxetine Swelling 12/31/2021  Pregabalin Swelling 12/31/2021   Clarithromycin Nausea Only and Other (See Comments) 03/07/2011   Spironolactone-hctz Rash 12/31/2021    Medications: No outpatient medications have been marked as taking for the 12/01/22 encounter (Office Visit) with Meade Maw, MD.    Social History: Social History   Tobacco Use   Smoking status: Former    Packs/day: 2.00    Years: 20.00    Total pack years: 40.00    Types: Cigarettes    Quit date: 10/29/2004    Years since  quitting: 18.1    Passive exposure: Past   Smokeless tobacco: Never  Vaping Use   Vaping Use: Never used  Substance Use Topics   Alcohol use: Yes    Comment: rarely has a glass of wine   Drug use: Never    Family Medical History: Family History  Problem Relation Age of Onset   Hypertension Mother    Hyperlipidemia Sister    Hypertension Sister    Depression Sister    Depression Brother    Sleep apnea Neg Hx     Physical Examination: Vitals:   12/01/22 1404  BP: 131/70  Pulse: 86    General: Patient is well developed, well nourished, calm, collected, and in no apparent distress. Attention to examination is appropriate.  Neck:   Supple.  Full range of motion.  Respiratory: Patient is breathing without any difficulty.   NEUROLOGICAL:     Awake, alert, oriented to person, place, and time.  Speech is clear and fluent.   Cranial Nerves: Pupils equal round and reactive to light.  Facial tone is symmetric.  Facial sensation is symmetric. Shoulder shrug is symmetric. Tongue protrusion is midline.  There is no pronator drift.  ROM of spine: full.    Strength: Side Biceps Triceps Deltoid Interossei Grip Wrist Ext. Wrist Flex.  R '5 5 5 5 5 5 5  '$ L '5 5 5 5 5 5 5   '$ Side Iliopsoas Quads Hamstring PF DF EHL  R '5 5 5 5 5 5  '$ L '5 5 5 5 5 5   '$ Reflexes are 1+ and symmetric at the biceps, triceps, brachioradialis, patella and achilles.   Hoffman's is absent.   Bilateral upper and lower extremity sensation is intact to light touch.    No evidence of dysmetria noted.  Gait is normal.     Medical Decision Making  Imaging: Thoracic spine MRI scan reviewed.  I have personally reviewed the images and agree with the above interpretation.  Assessment and Plan: Lisa Good is a pleasant 72 y.o. female with chronic pain syndrome and diabetic foot pain.  She had a successful spinal cord stimulator trial.  I think she is a good candidate for permanent implantation of a [employer] spinal cord stimulator and internal pulse generator.  I discussed the planned procedure at length with the patient, including the risks, benefits, alternatives, and indications. The risks discussed include but are not limited to bleeding, infection, need for reoperation, spinal fluid leak, stroke, vision loss, anesthetic complication, coma, paralysis, and even death. I also described in detail that improvement was not guaranteed.  The patient expressed understanding of these risks, and asked that we proceed with surgery. I described the surgery in layman's terms, and gave ample opportunity for questions, which were answered to the best of my ability.     Thank you for involving me in the care of this patient.      Kathryn Linarez K. Izora Ribas MD, [facility] Neurosurgery

## 2022-12-01 ENCOUNTER — Ambulatory Visit (INDEPENDENT_AMBULATORY_CARE_PROVIDER_SITE_OTHER): Payer: Medicare Other | Admitting: Neurosurgery

## 2022-12-01 ENCOUNTER — Encounter: Payer: Self-pay | Admitting: Neurosurgery

## 2022-12-01 ENCOUNTER — Other Ambulatory Visit: Payer: Self-pay

## 2022-12-01 VITALS — BP 131/70 | HR 86 | Ht 65.0 in | Wt 207.8 lb

## 2022-12-01 DIAGNOSIS — M792 Neuralgia and neuritis, unspecified: Secondary | ICD-10-CM | POA: Diagnosis not present

## 2022-12-01 DIAGNOSIS — G894 Chronic pain syndrome: Secondary | ICD-10-CM

## 2022-12-01 DIAGNOSIS — Z01818 Encounter for other preprocedural examination: Secondary | ICD-10-CM

## 2022-12-01 NOTE — Patient Instructions (Signed)
Please see below for information in regards to your upcoming surgery:  **we are planning to move our office location mid-March. Please check with [place] to see if we have moved to our new location prior to coming to your post op appointments after surgery. Our phone number will remain the same [phone]). Our new address will be: [facility] [address] [place], [place] [address]  Planned surgery: spinal cord stimulator implantation ([place])   Surgery date: 12/16/22 - you will find out your arrival time the business day before your surgery.   Pre-op appointment at [facility]: we will call you with a date/time for this. Pre-admit testing is located on the first floor of the [facility], [place], [address]. Please bring all prescriptions in the original prescription bottles to your appointment, even if you have reviewed medications by phone with a pharmacy representative. Pre-op labs may be done at your pre-op appointment. You are not required to fast for these labs. Should you need to change your pre-op appointment, please call Pre-admit testing at [phone].    Surgical clearance: we will send a clearance form to Paul[facility] Crad[facility]     We can be reached by phone or mychart 8am-4pm, Monday-Friday. If you have any questions/concerns before or after surgery, you can reach [place] at [phone], or you can send a mychart message. If you have a concern after hours that cannot wait until normal business hours, you can call [phone] and ask to page the neurosurgeon on call for [place].     Appointments/FMLA & disability paperwork: [place]  Nurse: Ophelia Shoulder  Medical assistants: Lum Keas Physician Assistant's: [place] Surgeon: Meade Maw, MD

## 2022-12-03 ENCOUNTER — Inpatient Hospital Stay: Admission: RE | Admit: 2022-12-03 | Payer: Medicare Other | Source: Ambulatory Visit

## 2022-12-03 NOTE — Telephone Encounter (Signed)
Spoke with patient and she is doing better and declined the visit.    Lisa Loser, MD  Gordy Clement, CMA4 days ago    I would have her come in and see the nurse practitioner and get another culture.  She is intermediate sensitive to the Macrodantin but resistant to other oral antibiotics.  She can try Macrodantin 100 mg twice a day for 7 more days but I think she should get another culture.

## 2022-12-07 ENCOUNTER — Encounter
Admission: RE | Admit: 2022-12-07 | Discharge: 2022-12-07 | Disposition: A | Discharge status: Home / Self Care | Payer: Medicare Other | Source: Ambulatory Visit | Attending: Neurosurgery | Admitting: Neurosurgery

## 2022-12-07 DIAGNOSIS — Z01812 Encounter for preprocedural laboratory examination: Secondary | ICD-10-CM

## 2022-12-07 DIAGNOSIS — Z01818 Encounter for other preprocedural examination: Secondary | ICD-10-CM | POA: Insufficient documentation

## 2022-12-07 DIAGNOSIS — Z01810 Encounter for preprocedural cardiovascular examination: Secondary | ICD-10-CM

## 2022-12-07 DIAGNOSIS — R300 Dysuria: Secondary | ICD-10-CM | POA: Diagnosis not present

## 2022-12-07 DIAGNOSIS — R8290 Unspecified abnormal findings in urine: Secondary | ICD-10-CM | POA: Insufficient documentation

## 2022-12-07 DIAGNOSIS — I10 Essential (primary) hypertension: Secondary | ICD-10-CM | POA: Diagnosis not present

## 2022-12-07 HISTORY — DX: Hypothyroidism, unspecified: E03.9

## 2022-12-07 HISTORY — DX: Obstructive sleep apnea (adult) (pediatric): G47.33

## 2022-12-07 HISTORY — DX: Unspecified urinary incontinence: R32

## 2022-12-07 LAB — URINALYSIS, ROUTINE W REFLEX MICROSCOPIC
Bacteria, UA: NONE SEEN
Bilirubin Urine: NEGATIVE
Glucose, UA: NEGATIVE mg/dL
Hgb urine dipstick: NEGATIVE
Ketones, ur: NEGATIVE mg/dL
Nitrite: NEGATIVE
Protein, ur: NEGATIVE mg/dL
Specific Gravity, Urine: 1.021 (ref 1.005–1.030)
WBC, UA: >50 WBC/hpf (ref 0–5)
pH: 5 (ref 5.0–8.0)

## 2022-12-07 LAB — CBC
HCT: 36.3 % (ref 36.0–46.0)
Hemoglobin: 10.8 g/dL — ABNORMAL LOW (ref 12.0–15.0)
MCH: 23.7 pg — ABNORMAL LOW (ref 26.0–34.0)
MCHC: 29.8 g/dL — ABNORMAL LOW (ref 30.0–36.0)
MCV: 79.6 fL — ABNORMAL LOW (ref 80.0–100.0)
Platelets: 269 10*3/uL (ref 150–400)
RBC: 4.56 MIL/uL (ref 3.87–5.11)
RDW: 16.2 % — ABNORMAL HIGH (ref 11.5–15.5)
WBC: 6.2 10*3/uL (ref 4.0–10.5)
nRBC: 0 % (ref 0.0–0.2)

## 2022-12-07 LAB — SURGICAL PCR SCREEN
MRSA, PCR: NEGATIVE
Staphylococcus aureus: NEGATIVE

## 2022-12-07 NOTE — Patient Instructions (Addendum)
Your procedure is scheduled on:12-16-22 Wednesday Report to the Registration Desk on the 1st floor of the [facility].Then proceed to the 2nd floor Surgery Desk To find out your arrival time, please call [phone] between 1PM - 3PM on:12-15-22 Tuesday If your arrival time is 6:00 am, do not arrive before that time as the [facility] entrance doors do not open until 6:00 am.  REMEMBER: Instructions that are not followed completely may result in serious medical risk, up to and including death; or upon the discretion of your surgeon and anesthesiologist your surgery may need to be rescheduled.  Do not eat food after midnight the night before surgery.  No gum chewing or hard candies.  You may however, drink CLEAR liquids up to 2 hours before you are scheduled to arrive for your surgery. Do not drink anything within 2 hours of your scheduled arrival time.  Clear liquids include: - water  - apple juice without pulp - gatorade (not RED colors) - black coffee or tea (Do NOT add milk or creamers to the coffee or tea) Do NOT drink anything that is not on this list.  One week prior to surgery:Last dose will be on 12-08-22  Stop Anti-inflammatories (NSAIDS) such as Advil, Aleve, Ibuprofen, Motrin, Naproxen, Naprosyn and Aspirin based products such as Excedrin, Goody's Powder, BC Powder.You may however, take Tylenol if needed for pain up until the day of surgery.  Stop ANY OVER THE COUNTER supplements/vitamins 7 days prior to surgery (Alpha Lipoic Acid)  TAKE ONLY THESE MEDICATIONS THE MORNING OF SURGERY WITH A SIP OF WATER: -atorvastatin (LIPITOR)  -gabapentin (NEURONTIN)  -oxyCODONE-acetaminophen (PERCOCET)  -pantoprazole (PROTONIX)-take one the night before surgery and one the morning of surgery -venlafaxine XR (EFFEXOR-XR)   No Alcohol for 24 hours before or after surgery.  No Smoking including e-cigarettes for 24 hours before surgery.  No chewable tobacco products for at least 6 hours  before surgery.  No nicotine patches on the day of surgery.  Do not use any "recreational" drugs for at least a week (preferably 2 weeks) before your surgery.  Please be advised that the combination of cocaine and anesthesia may have negative outcomes, up to and including death. If you test positive for cocaine, your surgery will be cancelled.  On the morning of surgery brush your teeth with toothpaste and water, you may rinse your mouth with mouthwash if you wish. Do not swallow any toothpaste or mouthwash.  Use CHG Soap as directed on instruction sheet.  Do not wear jewelry, make-up, hairpins, clips or nail polish.  Do not wear lotions, powders, or perfumes.   Do not shave body hair from the neck down 48 hours before surgery.  Contact lenses, hearing aids and dentures may not be worn into surgery.  Do not bring valuables to the hospital. [facility] is not responsible for any missing/lost belongings or valuables.   Bring your C-PAP to the hospital   Notify your doctor if there is any change in your medical condition (cold, fever, infection).  Wear comfortable clothing (specific to your surgery type) to the hospital.  After surgery, you can help prevent lung complications by doing breathing exercises.  Take deep breaths and cough every 1-2 hours. Your doctor may order a device called an Incentive Spirometer to help you take deep breaths. When coughing or sneezing, hold a pillow firmly against your incision with both hands. This is called "splinting." Doing this helps protect your incision. It also decreases belly discomfort.  If  you are being admitted to the hospital overnight, leave your suitcase in the car. After surgery it may be brought to your room.  In case of increased patient census, it may be necessary for you, the patient, to continue your postoperative care in the Same Day Surgery department.  If you are being discharged the day of surgery, you will not be allowed to  drive home. You will need a responsible individual to drive you home and stay with you for 24 hours after surgery.   If you are taking public transportation, you will need to have a responsible individual with you.  Please call the [facility]. at [phone] if you have any questions about these instructions.  Surgery Visitation Policy:  Patients undergoing a surgery or procedure may have two family members or support persons with them as long as the person is not COVID-19 positive or experiencing its symptoms.   Inpatient Visitation:    Visiting hours are 7 a.m. to 8 p.m. Up to four visitors are allowed at one time in a patient room. The visitors may rotate out with other people during the day. One designated support person (adult) may remain overnight.  Due to an increase in RSV and influenza rates and associated hospitalizations, children ages 69 and under will not be able to visit patients in [facility]. Masks continue to be strongly recommended.     Preparing for Surgery with CHLORHEXIDINE GLUCONATE (CHG) Soap  Chlorhexidine Gluconate (CHG) Soap  o An antiseptic cleaner that kills germs and bonds with the skin to continue killing germs even after washing  o Used for showering the night before surgery and morning of surgery  Before surgery, you can play an important role by reducing the number of germs on your skin.  CHG (Chlorhexidine gluconate) soap is an antiseptic cleanser which kills germs and bonds with the skin to continue killing germs even after washing.  Please do not use if you have an allergy to CHG or antibacterial soaps. If your skin becomes reddened/irritated stop using the CHG.  1. Shower the NIGHT BEFORE SURGERY and the MORNING OF SURGERY with CHG soap.  2. If you choose to wash your hair, wash your hair first as usual with your normal shampoo.  3. After shampooing, rinse your hair and body thoroughly to remove the  shampoo.  4. Use CHG as you would any other liquid soap. You can apply CHG directly to the skin and wash gently with a scrungie or a clean washcloth.  5. Apply the CHG soap to your body only from the neck down. Do not use on open wounds or open sores. Avoid contact with your eyes, ears, mouth, and genitals (private parts). Wash face and genitals (private parts) with your normal soap.  6. Wash thoroughly, paying special attention to the area where your surgery will be performed.  7. Thoroughly rinse your body with warm water.  8. Do not shower/wash with your normal soap after using and rinsing off the CHG soap.  9. Pat yourself dry with a clean towel.  10. Wear clean pajamas to bed the night before surgery.  12. Place clean sheets on your bed the night of your first shower and do not sleep with pets.  13. Shower again with the CHG soap on the day of surgery prior to arriving at the hospital.  14. Do not apply any deodorants/lotions/powders.  15. Please wear clean clothes to the hospital.

## 2022-12-08 LAB — URINE CULTURE: Culture: NO GROWTH

## 2022-12-15 ENCOUNTER — Telehealth: Payer: Self-pay

## 2022-12-15 NOTE — Telephone Encounter (Signed)
I spoke with Lisa Good to notify her that Dr Izora Ribas needs to postpone her surgery to next week due to an urgent inpatient add on. Her new surgery date is 12/25/22.

## 2022-12-23 ENCOUNTER — Encounter
Admission: RE | Admit: 2022-12-23 | Discharge: 2022-12-23 | Disposition: A | Discharge status: Home / Self Care | Payer: Medicare Other | Source: Ambulatory Visit | Attending: Neurosurgery | Admitting: Neurosurgery

## 2022-12-23 NOTE — Progress Notes (Signed)
Called pt to review instructions again since pts surgery had to be rescheduled due to Dr Izora Ribas having emergent case he had to add on bumping this pts surgery. No change in pts meds or medical history since she was here on 3-4. Pt still has her instructions from 12-07-22 when she did her preop with [place] in PAT along with her soap and instructions

## 2022-12-25 ENCOUNTER — Other Ambulatory Visit: Payer: Self-pay

## 2022-12-25 ENCOUNTER — Encounter
Admission: RE | Disposition: A | Discharge status: Home / Self Care | Payer: Self-pay | Source: Home / Self Care | Attending: Neurosurgery

## 2022-12-25 ENCOUNTER — Ambulatory Visit
Admission: RE | Admit: 2022-12-25 | Discharge: 2022-12-25 | Disposition: A | Discharge status: Home / Self Care | Payer: Medicare Other | Attending: Neurosurgery | Admitting: Neurosurgery

## 2022-12-25 ENCOUNTER — Ambulatory Visit: Payer: Medicare Other | Admitting: Certified Registered"

## 2022-12-25 ENCOUNTER — Encounter: Payer: Self-pay | Admitting: Neurosurgery

## 2022-12-25 ENCOUNTER — Ambulatory Visit: Payer: Medicare Other | Admitting: Urgent Care

## 2022-12-25 ENCOUNTER — Ambulatory Visit: Payer: Medicare Other

## 2022-12-25 DIAGNOSIS — D759 Disease of blood and blood-forming organs, unspecified: Secondary | ICD-10-CM | POA: Diagnosis not present

## 2022-12-25 DIAGNOSIS — D649 Anemia, unspecified: Secondary | ICD-10-CM | POA: Diagnosis not present

## 2022-12-25 DIAGNOSIS — G894 Chronic pain syndrome: Secondary | ICD-10-CM | POA: Diagnosis present

## 2022-12-25 DIAGNOSIS — E669 Obesity, unspecified: Secondary | ICD-10-CM | POA: Insufficient documentation

## 2022-12-25 DIAGNOSIS — R7303 Prediabetes: Secondary | ICD-10-CM | POA: Insufficient documentation

## 2022-12-25 DIAGNOSIS — N189 Chronic kidney disease, unspecified: Secondary | ICD-10-CM | POA: Diagnosis not present

## 2022-12-25 DIAGNOSIS — Z8711 Personal history of peptic ulcer disease: Secondary | ICD-10-CM | POA: Insufficient documentation

## 2022-12-25 DIAGNOSIS — Z87891 Personal history of nicotine dependence: Secondary | ICD-10-CM | POA: Insufficient documentation

## 2022-12-25 DIAGNOSIS — Z01818 Encounter for other preprocedural examination: Secondary | ICD-10-CM

## 2022-12-25 DIAGNOSIS — I129 Hypertensive chronic kidney disease with stage 1 through stage 4 chronic kidney disease, or unspecified chronic kidney disease: Secondary | ICD-10-CM | POA: Diagnosis not present

## 2022-12-25 DIAGNOSIS — G4730 Sleep apnea, unspecified: Secondary | ICD-10-CM | POA: Insufficient documentation

## 2022-12-25 DIAGNOSIS — M792 Neuralgia and neuritis, unspecified: Secondary | ICD-10-CM

## 2022-12-25 HISTORY — PX: THORACIC LAMINECTOMY FOR SPINAL CORD STIMULATOR: SHX6887

## 2022-12-25 SURGERY — THORACIC LAMINECTOMY FOR SPINAL CORD STIMULATOR
Anesthesia: General | Site: Thoracic

## 2022-12-25 MED ORDER — ACETAMINOPHEN 10 MG/ML IV SOLN
INTRAVENOUS | Status: DC | PRN
  Administered 2022-12-25: 1000 mg via INTRAVENOUS

## 2022-12-25 MED ORDER — PROPOFOL 10 MG/ML IV BOLUS
INTRAVENOUS | Status: DC | PRN
  Administered 2022-12-25: 150 mg via INTRAVENOUS

## 2022-12-25 MED ORDER — KETOROLAC TROMETHAMINE 30 MG/ML IJ SOLN
INTRAMUSCULAR | Status: AC
  Filled 2022-12-25: qty 1

## 2022-12-25 MED ORDER — VANCOMYCIN HCL IN DEXTROSE 1-5 GM/200ML-% IV SOLN: 1000.0000 mg | Freq: Once | INTRAVENOUS | Status: AC

## 2022-12-25 MED ORDER — FENTANYL CITRATE (PF) 100 MCG/2ML IJ SOLN: 25.0000 ug | INTRAMUSCULAR | Status: DC | PRN

## 2022-12-25 MED ORDER — ONDANSETRON HCL 4 MG/2ML IJ SOLN
INTRAMUSCULAR | Status: DC | PRN
  Administered 2022-12-25: 4 mg via INTRAVENOUS

## 2022-12-25 MED ORDER — OXYCODONE HCL 5 MG PO TABS
ORAL_TABLET | ORAL | Status: AC
  Administered 2022-12-25: 5 mg via ORAL
  Filled 2022-12-25: qty 1

## 2022-12-25 MED ORDER — OXYCODONE HCL 5 MG PO TABS: 5.0000 mg | ORAL_TABLET | Freq: Once | ORAL | Status: AC | PRN

## 2022-12-25 MED ORDER — METHOCARBAMOL 500 MG PO TABS: 500.0000 mg | ORAL_TABLET | Freq: Four times a day (QID) | ORAL | 0 refills | Status: DC

## 2022-12-25 MED ORDER — PROPOFOL 500 MG/50ML IV EMUL
INTRAVENOUS | Status: DC | PRN
  Administered 2022-12-25: 150 ug/kg/min via INTRAVENOUS

## 2022-12-25 MED ORDER — LACTATED RINGERS IV SOLN: INTRAVENOUS | Status: DC

## 2022-12-25 MED ORDER — METHOCARBAMOL 500 MG PO TABS: 500.0000 mg | ORAL_TABLET | Freq: Once | ORAL | Status: AC

## 2022-12-25 MED ORDER — DEXMEDETOMIDINE HCL IN NACL 80 MCG/20ML IV SOLN
INTRAVENOUS | Status: DC | PRN
  Administered 2022-12-25 (×3): 8 ug via BUCCAL

## 2022-12-25 MED ORDER — MIDAZOLAM HCL 2 MG/2ML IJ SOLN
INTRAMUSCULAR | Status: AC
  Filled 2022-12-25: qty 2

## 2022-12-25 MED ORDER — OXYCODONE HCL 5 MG/5ML PO SOLN: 5.0000 mg | Freq: Once | ORAL | Status: AC | PRN

## 2022-12-25 MED ORDER — PHENYLEPHRINE 80 MCG/ML (10ML) SYRINGE FOR IV PUSH (FOR BLOOD PRESSURE SUPPORT)
PREFILLED_SYRINGE | INTRAVENOUS | Status: DC | PRN
  Administered 2022-12-25: 80 ug via INTRAVENOUS
  Administered 2022-12-25: 120 ug via INTRAVENOUS

## 2022-12-25 MED ORDER — SODIUM CHLORIDE (PF) 0.9 % IJ SOLN
INTRAMUSCULAR | Status: DC | PRN
  Administered 2022-12-25: 60 mL via INTRAMUSCULAR

## 2022-12-25 MED ORDER — KETAMINE HCL 50 MG/5ML IJ SOSY
PREFILLED_SYRINGE | INTRAMUSCULAR | Status: AC
  Filled 2022-12-25: qty 5

## 2022-12-25 MED ORDER — MIDAZOLAM HCL 2 MG/2ML IJ SOLN
INTRAMUSCULAR | Status: DC | PRN
  Administered 2022-12-25: 2 mg via INTRAVENOUS

## 2022-12-25 MED ORDER — ACETAMINOPHEN 10 MG/ML IV SOLN: 1000.0000 mg | Freq: Once | INTRAVENOUS | Status: DC | PRN

## 2022-12-25 MED ORDER — CHLORHEXIDINE GLUCONATE 0.12 % MT SOLN: 15.0000 mL | Freq: Once | OROMUCOSAL | Status: AC

## 2022-12-25 MED ORDER — BUPIVACAINE HCL (PF) 0.5 % IJ SOLN
INTRAMUSCULAR | Status: AC
  Filled 2022-12-25: qty 60

## 2022-12-25 MED ORDER — PHENYLEPHRINE HCL-NACL 20-0.9 MG/250ML-% IV SOLN
INTRAVENOUS | Status: DC | PRN
  Administered 2022-12-25: 30 ug/min via INTRAVENOUS

## 2022-12-25 MED ORDER — HYDROMORPHONE HCL 1 MG/ML IJ SOLN
INTRAMUSCULAR | Status: DC | PRN
  Administered 2022-12-25 (×2): .5 mg via INTRAVENOUS

## 2022-12-25 MED ORDER — HYDROMORPHONE HCL 1 MG/ML IJ SOLN
INTRAMUSCULAR | Status: AC
  Filled 2022-12-25: qty 1

## 2022-12-25 MED ORDER — ONDANSETRON HCL 4 MG/2ML IJ SOLN: 4.0000 mg | Freq: Once | INTRAMUSCULAR | Status: DC | PRN

## 2022-12-25 MED ORDER — METHOCARBAMOL 500 MG PO TABS
ORAL_TABLET | ORAL | Status: AC
  Administered 2022-12-25: 500 mg via ORAL
  Filled 2022-12-25: qty 1

## 2022-12-25 MED ORDER — PROPOFOL 1000 MG/100ML IV EMUL
INTRAVENOUS | Status: AC
  Filled 2022-12-25: qty 100

## 2022-12-25 MED ORDER — ONDANSETRON HCL 4 MG/2ML IJ SOLN
INTRAMUSCULAR | Status: AC
  Filled 2022-12-25: qty 2

## 2022-12-25 MED ORDER — DEXAMETHASONE SODIUM PHOSPHATE 10 MG/ML IJ SOLN
INTRAMUSCULAR | Status: DC | PRN
  Administered 2022-12-25: 10 mg via INTRAVENOUS

## 2022-12-25 MED ORDER — OXYCODONE HCL 5 MG PO TABS: 5.0000 mg | ORAL_TABLET | Freq: Four times a day (QID) | ORAL | 0 refills | Status: AC | PRN

## 2022-12-25 MED ORDER — SUCCINYLCHOLINE CHLORIDE 200 MG/10ML IV SOSY
PREFILLED_SYRINGE | INTRAVENOUS | Status: DC | PRN
  Administered 2022-12-25: 120 mg via INTRAVENOUS

## 2022-12-25 MED ORDER — VANCOMYCIN HCL IN DEXTROSE 1-5 GM/200ML-% IV SOLN
INTRAVENOUS | Status: AC
  Administered 2022-12-25: 1000 mg via INTRAVENOUS
  Filled 2022-12-25: qty 200

## 2022-12-25 MED ORDER — BUPIVACAINE LIPOSOME 1.3 % IJ SUSP
INTRAMUSCULAR | Status: AC
  Filled 2022-12-25: qty 20

## 2022-12-25 MED ORDER — LIDOCAINE HCL (CARDIAC) PF 100 MG/5ML IV SOSY
PREFILLED_SYRINGE | INTRAVENOUS | Status: DC | PRN
  Administered 2022-12-25: 60 mg via INTRAVENOUS

## 2022-12-25 MED ORDER — PHENYLEPHRINE HCL-NACL 20-0.9 MG/250ML-% IV SOLN
INTRAVENOUS | Status: AC
  Filled 2022-12-25: qty 250

## 2022-12-25 MED ORDER — DEXMEDETOMIDINE HCL IN NACL 80 MCG/20ML IV SOLN
INTRAVENOUS | Status: AC
  Filled 2022-12-25: qty 20

## 2022-12-25 MED ORDER — PHENYLEPHRINE 80 MCG/ML (10ML) SYRINGE FOR IV PUSH (FOR BLOOD PRESSURE SUPPORT)
PREFILLED_SYRINGE | INTRAVENOUS | Status: AC
  Filled 2022-12-25: qty 10

## 2022-12-25 MED ORDER — IRRISEPT - 450ML BOTTLE WITH 0.05% CHG IN STERILE WATER, USP 99.95% OPTIME
TOPICAL | Status: DC | PRN
  Administered 2022-12-25: 450 mL

## 2022-12-25 MED ORDER — KETOROLAC TROMETHAMINE 30 MG/ML IJ SOLN
INTRAMUSCULAR | Status: DC | PRN
  Administered 2022-12-25: 30 mg via INTRAVENOUS

## 2022-12-25 MED ORDER — CEFAZOLIN SODIUM-DEXTROSE 2-4 GM/100ML-% IV SOLN
2.0000 g | Freq: Once | INTRAVENOUS | Status: AC
  Administered 2022-12-25: 2 g via INTRAVENOUS

## 2022-12-25 MED ORDER — ORAL CARE MOUTH RINSE: 15.0000 mL | Freq: Once | OROMUCOSAL | Status: AC

## 2022-12-25 MED ORDER — SUCCINYLCHOLINE CHLORIDE 200 MG/10ML IV SOSY
PREFILLED_SYRINGE | INTRAVENOUS | Status: AC
  Filled 2022-12-25: qty 10

## 2022-12-25 MED ORDER — PROPOFOL 10 MG/ML IV BOLUS
INTRAVENOUS | Status: AC
  Filled 2022-12-25: qty 20

## 2022-12-25 MED ORDER — 0.9 % SODIUM CHLORIDE (POUR BTL) OPTIME
TOPICAL | Status: DC | PRN
  Administered 2022-12-25: 500 mL

## 2022-12-25 MED ORDER — DEXAMETHASONE SODIUM PHOSPHATE 10 MG/ML IJ SOLN
INTRAMUSCULAR | Status: AC
  Filled 2022-12-25: qty 1

## 2022-12-25 MED ORDER — CHLORHEXIDINE GLUCONATE 0.12 % MT SOLN
OROMUCOSAL | Status: AC
  Administered 2022-12-25: 15 mL via OROMUCOSAL
  Filled 2022-12-25: qty 15

## 2022-12-25 MED ORDER — BUPIVACAINE-EPINEPHRINE (PF) 0.5% -1:200000 IJ SOLN
INTRAMUSCULAR | Status: DC | PRN
  Administered 2022-12-25: 9 mL

## 2022-12-25 MED ORDER — CEFAZOLIN SODIUM-DEXTROSE 2-4 GM/100ML-% IV SOLN
INTRAVENOUS | Status: AC
  Filled 2022-12-25: qty 100

## 2022-12-25 MED ORDER — EPINEPHRINE PF 1 MG/ML IJ SOLN
INTRAMUSCULAR | Status: AC
  Filled 2022-12-25: qty 1

## 2022-12-25 MED ORDER — LIDOCAINE HCL (PF) 2 % IJ SOLN
INTRAMUSCULAR | Status: AC
  Filled 2022-12-25: qty 5

## 2022-12-25 MED ORDER — ACETAMINOPHEN 10 MG/ML IV SOLN
INTRAVENOUS | Status: AC
  Filled 2022-12-25: qty 100

## 2022-12-25 SURGICAL SUPPLY — 58 items
ADH SKN CLS APL DERMABOND .7 (GAUZE/BANDAGES/DRESSINGS) ×2
AGENT HMST KT MTR STRL THRMB (HEMOSTASIS) ×1
BUR NEURO DRILL SOFT 3.0X3.8M (BURR) ×1 IMPLANT
CONTROL REMOTE FREELINK ALPHA (NEUROSURGERY SUPPLIES) IMPLANT
CUP MEDICINE 2OZ PLAST GRAD ST (MISCELLANEOUS) ×1 IMPLANT
DERMABOND ADVANCED .7 DNX12 (GAUZE/BANDAGES/DRESSINGS) ×2 IMPLANT
DRAPE C ARM PK CFD 31 SPINE (DRAPES) ×1 IMPLANT
DRAPE LAPAROTOMY 100X77 ABD (DRAPES) ×1 IMPLANT
DRSG OPSITE POSTOP 4X6 (GAUZE/BANDAGES/DRESSINGS) IMPLANT
DRSG OPSITE POSTOP 4X8 (GAUZE/BANDAGES/DRESSINGS) IMPLANT
ELECT CAUTERY BLADE TIP 2.5 (TIP) ×1
ELECT REM PT RETURN 9FT ADLT (ELECTROSURGICAL) ×1
ELECTRODE CAUTERY BLDE TIP 2.5 (TIP) ×1 IMPLANT
ELECTRODE REM PT RTRN 9FT ADLT (ELECTROSURGICAL) ×1 IMPLANT
ELEVATER PASSER (SPINAL CORD STIMULATOR) ×1
FEE INTRAOP CADWELL SUPPLY NCS (MISCELLANEOUS) IMPLANT
FEE INTRAOP MONITOR IMPULS NCS (MISCELLANEOUS) IMPLANT
GLOVE BIOGEL PI IND STRL 6.5 (GLOVE) ×1 IMPLANT
GLOVE SURG SYN 6.5 ES PF (GLOVE) ×1 IMPLANT
GLOVE SURG SYN 6.5 PF PI (GLOVE) ×1 IMPLANT
GLOVE SURG SYN 8.5  E (GLOVE) ×3
GLOVE SURG SYN 8.5 E (GLOVE) ×3 IMPLANT
GLOVE SURG SYN 8.5 PF PI (GLOVE) ×3 IMPLANT
GOWN SRG LRG LVL 4 IMPRV REINF (GOWNS) ×1 IMPLANT
GOWN SRG XL LVL 3 NONREINFORCE (GOWNS) ×1 IMPLANT
GOWN STRL NON-REIN TWL XL LVL3 (GOWNS) ×1
GOWN STRL REIN LRG LVL4 (GOWNS) ×1
GRAFT DURAGEN MATRIX 1WX1L (Tissue) IMPLANT
INTRAOP CADWELL SUPPLY FEE NCS (MISCELLANEOUS) ×1
INTRAOP DISP SUPPLY FEE NCS (MISCELLANEOUS) ×1
INTRAOP MONITOR FEE IMPULS NCS (MISCELLANEOUS) ×1
INTRAOP MONITOR FEE IMPULSE (MISCELLANEOUS) ×1
JET LAVAGE IRRISEPT WOUND (IRRIGATION / IRRIGATOR) ×1
KIT CHARGING PRECISION NEURO (KITS) IMPLANT
KIT IPG ALPHA WAVEWRITER (Stimulator) IMPLANT
KIT SPINAL PRONEVIEW (KITS) ×1 IMPLANT
LAVAGE JET IRRISEPT WOUND (IRRIGATION / IRRIGATOR) IMPLANT
LEAD ARTISAN 50CM (Spinal Cord Stimulator) IMPLANT
MANIFOLD NEPTUNE II (INSTRUMENTS) ×1 IMPLANT
MARKER SKIN DUAL TIP RULER LAB (MISCELLANEOUS) ×1 IMPLANT
NDL SAFETY ECLIP 18X1.5 (MISCELLANEOUS) ×1 IMPLANT
NS IRRIG 1000ML POUR BTL (IV SOLUTION) ×1 IMPLANT
NS IRRIG 500ML POUR BTL (IV SOLUTION) IMPLANT
PACK LAMINECTOMY NEURO (CUSTOM PROCEDURE TRAY) ×1 IMPLANT
PASSER ELEVATOR (SPINAL CORD STIMULATOR) IMPLANT
SOLUTION IRRIG SURGIPHOR (IV SOLUTION) ×1 IMPLANT
STAPLER SKIN PROX 35W (STAPLE) ×1 IMPLANT
SURGIFLO W/THROMBIN 8M KIT (HEMOSTASIS) ×1 IMPLANT
SUT DVC VLOC 3-0 CL 6 P-12 (SUTURE) ×1 IMPLANT
SUT SILK 2 0SH CR/8 30 (SUTURE) ×1 IMPLANT
SUT VIC AB 0 CT1 18XCR BRD 8 (SUTURE) ×1 IMPLANT
SUT VIC AB 0 CT1 8-18 (SUTURE) ×2
SUT VIC AB 2-0 CT1 18 (SUTURE) ×1 IMPLANT
SYR 10ML LL (SYRINGE) ×1 IMPLANT
SYR 30ML LL (SYRINGE) ×2 IMPLANT
TOOL LONG TUNNEL (SPINAL CORD STIMULATOR) IMPLANT
TOWEL OR 17X26 4PK STRL BLUE (TOWEL DISPOSABLE) ×2 IMPLANT
TRAP FLUID SMOKE EVACUATOR (MISCELLANEOUS) ×1 IMPLANT

## 2022-12-25 NOTE — Transfer of Care (Signed)
Immediate Anesthesia Transfer of Care Note  Patient: Lisa Good  Procedure(s) Performed: THORACIC LAMINECTOMY FOR SPINAL CORD STIMULATOR IMPLANTATION ([employer]) (Thoracic)  Patient Location: PACU  Anesthesia Type:General  Level of Consciousness: drowsy  Airway & Oxygen Therapy: Patient Spontanous Breathing and Patient connected to face mask oxygen  Post-op Assessment: Report given to RN, Post -op Vital signs reviewed and stable, and Patient moving all extremities  Post vital signs: Reviewed and stable  Last Vitals:  Vitals Value Taken Time  BP 122/52 12/25/22 1436  Temp    Pulse 83 12/25/22 1442  Resp 10 12/25/22 1442  SpO2 96 % 12/25/22 1442  Vitals shown include unvalidated device data.  Last Pain:  Vitals:   12/25/22 1149  TempSrc: Temporal  PainSc: 0-No pain         Complications: No notable events documented.

## 2022-12-25 NOTE — Op Note (Signed)
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 The patient had a successful trial for spinal cord stimulation, so was consented for placement of a permanent device   Findings: successful placement of a [employer] spinal cord stimulator.   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     Preoperative Note:    Risks of surgery discussed in clinic.   Operative Note:      The patient was then brought from the preoperative center with intravenous access established.  The patient underwent general anesthesia and endotracheal tube intubation, then was rotated on the [facility] table where all pressure points were appropriately padded.  An incision was marked with flouroscopy at T10/11, and on the right flank. The skin was then thoroughly cleansed.  Perioperative antibiotic prophylaxis was administered.  Sterile prep and drapes were then applied and a timeout was then observed.     Once this was complete an incision was opened with the use of a #10 blade knife in the midline at the thoracic incision.  The paraspinus muscled were subperiosteally dissected until the laminae of T10 and T11 were visualized. Flouroscopy was used to confirm the level. A self-retaining retractor was placed.   The rongeur was used to remove the spinous process of T11.  The drill was used to thin the bone until the ligamentum flavum was visualized.  The ligamentum was then removed and the dura visualized. This was widened until placement of the paddle lead was possible.  The inferior half of T10 was also removed.   The lead was then advanced to the T9 pedicle at the top of the lead.  The  lead was secured with a 2-0 silk suture.     The incision on the flank was then opened and a pocket formed until it was large enough for the pulse generator.  The tunneler was used to connect between the pocket and the incision.  The lead was inserted into the tunneler and tunneled to the buttock.  The leads were attached to the IPG and impedances checked.  The leads were then tightened.  The IPG was then inserted into the pouch.   Both sites were irrigated.  The wounds were closed in layers with 0 and 2-0 vicryl.  The skin was approximated with  monocryl. A sterile dressing was applied.   Monitoring was stable throughout.   Patient was then rotated back to the preoperative bed awakened from anesthesia and taken to recovery. All counts are correct in this case.   I performed the entire procedure with the assistance of Cooper Render PA as an Pensions consultant. An assistant was required for this procedure due to the complexity.  The assistant provided assistance in tissue manipulation and suction, and was required for the successful and safe performance of the procedure. I performed the critical portions of the procedure.      Meade Maw MD

## 2022-12-25 NOTE — Anesthesia Preprocedure Evaluation (Addendum)
Anesthesia Evaluation  Patient identified by MRN, date of birth, ID band Patient awake    Reviewed: Allergy & Precautions, NPO status , Patient's Chart, lab work & pertinent test results  History of Anesthesia Complications Negative for: history of anesthetic complications  Airway Mallampati: III   Neck ROM: Full    Dental  (+) Missing   Pulmonary sleep apnea and Continuous Positive Airway Pressure Ventilation , former smoker (quit 2006)   Pulmonary exam normal breath sounds clear to auscultation       Cardiovascular hypertension, Normal cardiovascular exam Rhythm:Regular Rate:Normal  ECG 12/07/22:  Normal sinus rhythm Minimal voltage criteria for LVH, may be normal variant ( R in aVL ) 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)   Neuro/Psych  PSYCHIATRIC DISORDERS Anxiety Depression    Hx cerebral aneurysm s/p repair 2002; chronic pain    GI/Hepatic PUD,GERD  ,,  Endo/Other  Hypothyroidism  Prediabetes; obesity  Renal/GU Renal disease (CKD)     Musculoskeletal  (+) Arthritis ,  Fibromyalgia -  Abdominal   Peds  Hematology  (+) Blood dyscrasia, anemia   Anesthesia Other Findings   Reproductive/Obstetrics                             Anesthesia Physical Anesthesia Plan  ASA: 3  Anesthesia Plan: General   Post-op Pain Management:    Induction: Intravenous  PONV Risk Score and Plan: 3 and Ondansetron, Dexamethasone and Treatment may vary due to age or medical condition  Airway Management Planned: Oral ETT  Additional Equipment:   Intra-op Plan:   Post-operative Plan: Extubation in OR  Informed Consent: I have reviewed the patients History and Physical, chart, labs and discussed the procedure including the risks, benefits and alternatives for the proposed anesthesia with the patient or authorized representative who has indicated his/her understanding and acceptance.      Dental advisory given  Plan Discussed with: CRNA  Anesthesia Plan Comments: (Patient consented for risks of anesthesia including but not limited to:  - adverse reactions to medications - damage to eyes, teeth, lips or other oral mucosa - nerve damage due to positioning  - sore throat or hoarseness - damage to heart, brain, nerves, lungs, other parts of body or loss of life  Informed patient about role of CRNA in peri- and intra-operative care.  Patient voiced understanding.)        Anesthesia Quick Evaluation

## 2022-12-25 NOTE — Anesthesia Procedure Notes (Signed)
Procedure Name: Intubation Date/Time: 12/25/2022 12:29 PM  Performed by: Esaw Grandchild, CRNAPre-anesthesia Checklist: Patient identified, Emergency Drugs available, Suction available and Patient being monitored Patient Re-evaluated:Patient Re-evaluated prior to induction Oxygen Delivery Method: Circle system utilized Preoxygenation: Pre-oxygenation with 100% oxygen Induction Type: IV induction Laryngoscope Size: McGraph and 3 Grade View: Grade I Tube type: Oral Tube size: 7.0 mm Number of attempts: 1 Airway Equipment and Method: Stylet, Oral airway, LTA kit utilized and Bite block Placement Confirmation: ETT inserted through vocal cords under direct vision, positive ETCO2 and breath sounds checked- equal and bilateral Secured at: 21 cm Tube secured with: Tape Dental Injury: Teeth and Oropharynx as per pre-operative assessment

## 2022-12-25 NOTE — Discharge Summary (Signed)
Discharge Summary  Patient ID: Jacqline Kubota MRN: [id number] DOB/AGE: 72-Jan-1952 72 y.o.  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 Please see separately dictated operative report for further details  Discharge Exam: Blood pressure (!) 149/76, pulse 95, temperature (!) 96.9 F (36.1 C), temperature source Temporal, resp. rate 16, SpO2 97 %. {physical [id number]  Disposition: Discharge disposition: 01-Home or Self Care        Allergies as of 12/25/2022       Reactions   Contrast Media [iodinated Contrast Media] Hives   Cephalexin Nausea Only, Other (See Comments)   Pt has taken since with no problems   Iodine Hives   Duloxetine Swelling   Legs and feet   Pregabalin Swelling   Legs and feet   Clarithromycin Nausea Only, Other (See Comments)   Spironolactone-hctz Rash   Only with iv injection        Medication List     STOP taking these medications    docusate sodium 50 MG capsule Commonly known as: COLACE       TAKE these medications    Alpha-Lipoic Acid 600 MG Caps Take 600 mg by mouth 2 (two) times daily.   amLODipine 10 MG tablet Commonly known as: NORVASC Take 10 mg by mouth at bedtime.   atorvastatin 40 MG tablet Commonly known as: LIPITOR Take 40 mg by mouth every morning.   famotidine 20 MG tablet Commonly known as: PEPCID Take 20 mg by mouth daily as needed for heartburn or indigestion.   gabapentin 800 MG tablet Commonly known as: NEURONTIN Take 1 tablet (800 mg total) by mouth 4 (four) times daily. What changed:  when to take this additional instructions   ibuprofen 200 MG tablet Commonly known as:  ADVIL Take 400-600 mg by mouth as needed for moderate pain.   ipratropium 0.06 % nasal spray Commonly known as: ATROVENT Place 2 sprays into both nostrils 3 (three) times daily. As needed for nasal congestion, runny nose   lisinopril 40 MG tablet Commonly known as: ZESTRIL Take 40 mg by mouth at bedtime.   methocarbamol 500 MG tablet Commonly known as: ROBAXIN Take 1 tablet (500 mg total) by mouth 4 (four) times daily.   OVER THE COUNTER MEDICATION Apply 1 application. topically 3 (three) times daily as needed (pain). Outback original pain relieving oil, apply to feet   oxyCODONE 5 MG immediate release tablet Commonly known as: Roxicodone Take 1 tablet (5 mg total) by mouth every 6 (six) hours as needed for up to 5 days for breakthrough pain (pain refractory to home Percocet).   oxyCODONE-acetaminophen 10-325 MG tablet Commonly known as: PERCOCET Take 1 tablet by mouth 4 (four) times daily.   pantoprazole 40 MG tablet Commonly known as: PROTONIX Take 40 mg by mouth every morning.   traZODone 100 MG tablet Commonly known as: DESYREL Take 1 tablet by mouth at bedtime.   venlafaxine XR 150 MG 24 hr capsule Commonly known as: EFFEXOR-XR Take 150 mg by mouth daily with breakfast. Take with 75 mg to equal 225 mg daily   venlafaxine XR 75 MG 24 hr capsule Commonly known as: EFFEXOR-XR Take 75 mg by mouth daily with breakfast. Take with 150 mg to equal  225 mg daily   XYLITOL MT Use as directed 2 tablets in the mouth or throat daily as needed (dry mouth).        Follow-up Information     Loleta Dicker, PA Follow up on 01/07/2023.   Specialty: Neurosurgery Contact information: [place] [id number] [phone]                 Signed: Loleta Dicker 12/25/2022, 2:27 PM

## 2022-12-25 NOTE — Interval H&P Note (Signed)
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  The various methods of treatment have been discussed with the patient and family. After consideration of risks, benefits and other options for treatment, the patient has consented to  Procedure(s): [place] ([place]) (N/A) as a surgical intervention.  The patient's history has been reviewed, patient examined, no change in status, stable for surgery.  I have reviewed the patient's chart and labs.  Questions were answered to the patient's satisfaction.    Heart sounds normal no MRG. Chest Clear to Auscultation Bilaterally.  Vipul Cafarelli

## 2022-12-25 NOTE — Discharge Instructions (Addendum)
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 Increase protein intake to promote wound healing.  Recommended activity: no lifting, driving, or strenuous exercise for 4 weeks . You should walk multiple times per day  Special Instructions  No straining, no heavy lifting > 10lbs x 4 weeks.  Keep incision area clean and dry. May shower in 2 days. No baths or pools for 6 weeks.  Please remove dressing in 3 days, no need to apply a bandage afterwards  You have no sutures to remove, the skin is closed with adhesive  Please take pain medications as directed. Take a stool softener if on pain medications   Please Report any of the following: Nausea or Vomiting, Temperature is greater than 101.35F (38.1C) degrees, Dizziness, Abdominal Pain, Difficulty Breathing or Shortness of Breath, Inability to Eat, drink Fluids, or Take medications, Bleeding, swelling, or drainage from surgical incision sites, New numbness or weakness, and Bowel or bladder dysfunction to the neurosurgeon on call at [phone]  Additional Follow up appointments Please follow up with Cooper Render PA-C in [place] clinic as scheduled in 2-3 weeks   Please see below for scheduled appointments:  Future Appointments  Date Time Provider [place]  01/04/2023 11:30 AM [facility] None  01/07/2023 11:00 AM Loleta Dicker, PA [facility] None  01/13/2023 11:00 AM Debbora Presto, NP [facility] None  01/18/2023  1:30 PM Bjorn Loser, MD [facility] None  02/09/2023 11:45 AM Meade Maw, MD [facility] None    AMBULATORY SURGERY  DISCHARGE INSTRUCTIONS   The drugs that you were given will stay in your system until tomorrow so for the next 24 hours you should not:  Drive an automobile Make any  legal decisions Drink any alcoholic beverage   You may resume regular meals tomorrow.  Today it is better to start with liquids and gradually work up to solid foods.  You may eat anything you prefer, but it is better to start with liquids, then soup and crackers, and gradually work up to solid foods.   Please notify your doctor immediately if you have any unusual bleeding, trouble breathing, redness and pain at the surgery site, drainage, fever, or pain not relieved by medication.    Additional Instructions:  PLEASE LEAVE TEAL ARMBAND (EXPAREL BAND) ON FOR 4 DAYS    Please contact your physician with any problems or Same Day Surgery at [phone], Monday through Friday 6 am to 4 pm, or [place] at [facility] number at [phone].

## 2022-12-28 ENCOUNTER — Encounter: Payer: Self-pay | Admitting: Neurosurgery

## 2022-12-29 ENCOUNTER — Encounter: Payer: Medicare Other | Admitting: Neurosurgery

## 2022-12-31 ENCOUNTER — Other Ambulatory Visit: Payer: Self-pay | Admitting: *Deleted

## 2022-12-31 DIAGNOSIS — N3946 Mixed incontinence: Secondary | ICD-10-CM

## 2023-01-01 ENCOUNTER — Encounter (HOSPITAL_COMMUNITY): Payer: Self-pay | Admitting: Emergency Medicine

## 2023-01-01 ENCOUNTER — Ambulatory Visit
Admission: EM | Admit: 2023-01-01 | Discharge: 2023-01-01 | Disposition: A | Discharge status: Home / Self Care | Payer: Medicare Other | Attending: Family Medicine | Admitting: Family Medicine

## 2023-01-01 ENCOUNTER — Emergency Department (HOSPITAL_COMMUNITY)
Admission: EM | Admit: 2023-01-01 | Discharge: 2023-01-02 | Discharge status: Against Medical Advice | Payer: Medicare Other | Attending: Emergency Medicine | Admitting: Emergency Medicine

## 2023-01-01 ENCOUNTER — Emergency Department (HOSPITAL_COMMUNITY): Payer: Medicare Other

## 2023-01-01 ENCOUNTER — Encounter: Payer: Self-pay | Admitting: Oncology

## 2023-01-01 DIAGNOSIS — R110 Nausea: Secondary | ICD-10-CM | POA: Insufficient documentation

## 2023-01-01 DIAGNOSIS — R197 Diarrhea, unspecified: Secondary | ICD-10-CM | POA: Diagnosis not present

## 2023-01-01 DIAGNOSIS — Z5321 Procedure and treatment not carried out due to patient leaving prior to being seen by health care provider: Secondary | ICD-10-CM | POA: Diagnosis not present

## 2023-01-01 DIAGNOSIS — R1030 Lower abdominal pain, unspecified: Secondary | ICD-10-CM | POA: Insufficient documentation

## 2023-01-01 LAB — CBC WITH DIFFERENTIAL/PLATELET
Abs Immature Granulocytes: 0.03 K/uL (ref 0.00–0.07)
Basophils Absolute: 0 K/uL (ref 0.0–0.1)
Basophils Relative: 1 %
Eosinophils Absolute: 0.3 K/uL (ref 0.0–0.5)
Eosinophils Relative: 4 %
HCT: 31.9 % — ABNORMAL LOW (ref 36.0–46.0)
Hemoglobin: 9.6 g/dL — ABNORMAL LOW (ref 12.0–15.0)
Immature Granulocytes: 0 %
Lymphocytes Relative: 15 %
Lymphs Abs: 1.1 K/uL (ref 0.7–4.0)
MCH: 24.1 pg — ABNORMAL LOW (ref 26.0–34.0)
MCHC: 30.1 g/dL (ref 30.0–36.0)
MCV: 79.9 fL — ABNORMAL LOW (ref 80.0–100.0)
Monocytes Absolute: 0.5 K/uL (ref 0.1–1.0)
Monocytes Relative: 7 %
Neutro Abs: 5.3 K/uL (ref 1.7–7.7)
Neutrophils Relative %: 73 %
Platelets: 304 K/uL (ref 150–400)
RBC: 3.99 MIL/uL (ref 3.87–5.11)
RDW: 16.7 % — ABNORMAL HIGH (ref 11.5–15.5)
WBC: 7.1 K/uL (ref 4.0–10.5)
nRBC: 0 % (ref 0.0–0.2)

## 2023-01-01 LAB — URINALYSIS, ROUTINE W REFLEX MICROSCOPIC
Bacteria, UA: NONE SEEN
Bilirubin Urine: NEGATIVE
Glucose, UA: NEGATIVE mg/dL
Hgb urine dipstick: NEGATIVE
Ketones, ur: NEGATIVE mg/dL
Nitrite: NEGATIVE
Protein, ur: NEGATIVE mg/dL
Specific Gravity, Urine: 1.014 (ref 1.005–1.030)
pH: 5 (ref 5.0–8.0)

## 2023-01-01 LAB — COMPREHENSIVE METABOLIC PANEL WITH GFR
ALT: 11 U/L (ref 0–44)
AST: 16 U/L (ref 15–41)
Albumin: 3.1 g/dL — ABNORMAL LOW (ref 3.5–5.0)
Alkaline Phosphatase: 90 U/L (ref 38–126)
Anion gap: 9 (ref 5–15)
BUN: 13 mg/dL (ref 8–23)
CO2: 26 mmol/L (ref 22–32)
Calcium: 8.9 mg/dL (ref 8.9–10.3)
Chloride: 100 mmol/L (ref 98–111)
Creatinine, Ser: 0.97 mg/dL (ref 0.44–1.00)
GFR, Estimated: >60 mL/min
Glucose, Bld: 132 mg/dL — ABNORMAL HIGH (ref 70–99)
Potassium: 4.2 mmol/L (ref 3.5–5.1)
Sodium: 135 mmol/L (ref 135–145)
Total Bilirubin: 0.4 mg/dL (ref 0.3–1.2)
Total Protein: 6.3 g/dL — ABNORMAL LOW (ref 6.5–8.1)

## 2023-01-01 LAB — LIPASE, BLOOD: Lipase: 47 U/L (ref 11–51)

## 2023-01-01 NOTE — ED Triage Notes (Signed)
Pt reports n/v/d x 2 days.  Vomiting has improved but u/a to eat or sleep d/t pain. Reports ongoing lower abd pain.  Diarrhea did have blood in it but has improved. Pepto Bismol helped slightly.

## 2023-01-01 NOTE — ED Provider Notes (Signed)
[facility]    CSN: [id number] Arrival date & time: 01/01/23  1743      History   Chief Complaint Chief Complaint  Patient presents with   Abdominal Pain   Diarrhea    HPI Lisa Good is a 72 y.o. female.    Abdominal Pain Associated symptoms: diarrhea   Diarrhea Associated symptoms: abdominal pain    Here for severe lower abdominal pain.  Symptoms began yesterday; first some vomiting and then she had some diarrhea.  The stool did have some blood in it but that has improved.  She did not vomit very much, she states.  She has been hurting a lot in her lower abdomen and that has continued despite the other symptoms improving.  She has been and able to eat or sleep due to the pain.  No fever noted  Of note she had spinal surgery to place a spinal stimulator 7 days ago.  No dysuria  Past Medical History:  Diagnosis Date   Allergy    seasonal   Arthritis    Cerebral aneurysm    Chronic kidney disease    Chronic pain syndrome    Complication of anesthesia    trouble waking up after back surgery   DDD (degenerative disc disease), lumbar    Depression    DVT (deep venous thrombosis) ([place]) 2002   Fibromyalgia    GERD (gastroesophageal reflux disease)    History of kidney stones    Hyperlipidemia    Hypertension    Hypothyroidism    Incontinence in female    Iron deficiency anemia 11/01/2019   Irregular heart beat    Neuromuscular disorder (HCC)    peripheral neuropathy/feet   Obesity    OSA on CPAP    Pancolitis (HCC)    Pre-diabetes    RLS (restless legs syndrome)    UTI (urinary tract infection) 11/2022    Patient Active Problem List   Diagnosis Date Noted   Neuropathic pain of foot 12/25/2022   Chronic right shoulder pain 11/11/2020   Right rotator cuff tear arthropathy 11/11/2020   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   Chronic radicular lumbar pain 12/04/2019   Iron deficiency anemia 11/01/2019   Primary osteoarthritis of left knee  10/17/2019   Primary osteoarthritis of right shoulder 10/17/2019   Hx of cervical spine surgery 08/21/2019   Pancolitis ([place]) 07/20/2019   Kyphosis of cervical region 07/14/2019   Cervical spondylosis with myelopathy 06/28/2019   Chronic low back pain 06/28/2019   Cervical spondylosis without myelopathy 11/09/2018   Foraminal stenosis of cervical region 11/09/2018   Cervical stenosis of spinal canal 11/09/2018   History of fusion of lumbar spine 11/09/2018   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   Postlaminectomy syndrome of lumbosacral region 11/09/2018   Chronic pain syndrome 11/09/2018   Severe obesity (BMI 35.0-39.9) with comorbidity ([place]) 08/18/2018   Primary osteoarthritis of right knee 02/17/2017   Hyperlipidemia 12/02/2016   Incontinence in female 10/05/2016   Spondylolisthesis, lumbar region 05/05/2016   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   OSA (obstructive sleep apnea) 05/31/2015   Hypothyroidism 02/09/2014   Sleep disorder 02/09/2014   Asymptomatic hyperuricemia 08/29/2013   Polyarthritis 08/29/2013   Essential hypertension 06/28/2013   SUI (stress urinary incontinence, female) 06/20/2013   Insomnia 05/22/2013   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   Peripheral neuropathy 05/25/2012   Venous (peripheral) insufficiency 05/25/2012   Depression 10/03/2009   Restless legs syndrome (RLS) 10/03/2009   Fibromyalgia 02/18/2009  Gastro-esophageal reflux disease with esophagitis 10/23/2008   Generalized anxiety disorder 10/23/2008   GERD (gastroesophageal reflux disease) 10/23/2008   Pure hypercholesterolemia 10/23/2008    Past Surgical History:  Procedure Laterality Date   BRAIN SURGERY  2002   aneurysm   CEREBRAL ANEURYSM REPAIR  2002   COLONOSCOPY WITH PROPOFOL N/A 11/10/2019   Procedure: COLONOSCOPY WITH PROPOFOL;  Surgeon: Jonathon Bellows, MD;  Location: [facility];  Service: Endoscopy;  Laterality: N/A;   ESOPHAGOGASTRODUODENOSCOPY  (EGD) WITH PROPOFOL N/A 11/10/2019   Procedure: ESOPHAGOGASTRODUODENOSCOPY (EGD) WITH PROPOFOL;  Surgeon: Jonathon Bellows, MD;  Location: [place];  Service: Endoscopy;  Laterality: N/A;   EXTRACORPOREAL SHOCK WAVE LITHOTRIPSY Right 04/27/2019   Procedure: EXTRACORPOREAL SHOCK WAVE LITHOTRIPSY (ESWL);  Surgeon: Billey Co, MD;  Location: [facility];  Service: Urology;  Laterality: Right;   JOINT REPLACEMENT Right    knee   KNEE ARTHROPLASTY Left 01/12/2020   Procedure: COMPUTER ASSISTED TOTAL KNEE ARTHROPLASTY;  Surgeon: Dereck Leep, MD;  Location: [facility];  Service: Orthopedics;  Laterality: Left;   POLYPECTOMY  11/10/2019   Procedure: POLYPECTOMY;  Surgeon: Jonathon Bellows, MD;  Location: [place];  Service: Endoscopy;;   REVERSE SHOULDER ARTHROPLASTY Right 01/06/2022   Procedure: REVERSE SHOULDER ARTHROPLASTY;  Surgeon: Corky Mull, MD;  Location: [facility];  Service: Orthopedics;  Laterality: Right;   SPINAL FUSION  07/10/2019   C4-5 corpectomy with C6-7 ACDF, C2-T3 spinal fusion   SPINE SURGERY     spinal fusion   THORACIC LAMINECTOMY FOR SPINAL CORD STIMULATOR N/A 12/25/2022   Procedure: THORACIC LAMINECTOMY FOR SPINAL CORD STIMULATOR IMPLANTATION ([employer]);  Surgeon: Meade Maw, MD;  Location: [facility];  Service: Neurosurgery;  Laterality: N/A;   TUBAL LIGATION      OB History   No obstetric history on file.      Home Medications    Prior to Admission medications   Medication Sig Start Date End Date Taking? Authorizing Provider  Alpha-Lipoic Acid 600 MG CAPS Take 600 mg by mouth 2 (two) times daily.    [provider]  amLODipine (NORVASC) 10 MG tablet Take 10 mg by mouth at bedtime.    [provider]  atorvastatin (LIPITOR) 40 MG tablet Take 40 mg by mouth every morning.    [provider]  famotidine (PEPCID) 20 MG tablet Take 20 mg by mouth daily as needed for heartburn or indigestion.    [provider]  gabapentin (NEURONTIN) 800 MG tablet Take 1 tablet (800 mg total) by mouth 4 (four) times daily. Patient taking differently: Take 800 mg by mouth 3 (three) times daily. 1 in am, 1 at lunch and 2 at bedtime 01/09/21   Gillis Santa, MD  ibuprofen (ADVIL) 200 MG tablet Take 400-600 mg by mouth as needed for moderate pain.    [provider]  ipratropium (ATROVENT) 0.06 % nasal spray Place 2 sprays into both nostrils 3 (three) times daily. As needed for nasal congestion, runny nose 10/03/22   Lynden Oxford Scales, PA-C  lisinopril (PRINIVIL,ZESTRIL) 40 MG tablet Take 40 mg by mouth at bedtime.    [provider]  methocarbamol (ROBAXIN) 500 MG tablet Take 1 tablet (500 mg total) by mouth 4 (four) times daily. 12/25/22   Loleta Dicker, PA  OVER THE COUNTER MEDICATION Apply 1 application. topically 3 (three) times daily as needed (pain). [employer] original pain relieving oil, apply to feet    [provider]  oxyCODONE-acetaminophen (PERCOCET) 10-325  MG tablet Take 1 tablet by mouth 4 (four) times daily. 11/30/21   [provider]  pantoprazole (PROTONIX) 40 MG tablet Take 40 mg by mouth every morning. 11/17/21   [provider]  traZODone (DESYREL) 100 MG tablet Take 1 tablet by mouth at bedtime. 08/14/22   [provider]  venlafaxine XR (EFFEXOR-XR) 150 MG 24 hr capsule Take 150 mg by mouth daily with breakfast. Take with 75 mg to equal 225 mg daily 10/26/19   [provider]  venlafaxine XR (EFFEXOR-XR) 75 MG 24 hr capsule Take 75 mg by mouth daily with breakfast. Take with 150 mg to equal 225 mg daily 10/16/21   [provider]  XYLITOL MT Use as directed 2 tablets in the mouth or throat daily as needed (dry mouth).    [provider]    Family History Family History  Problem Relation Age of Onset   Hypertension Mother    Hyperlipidemia Sister    Hypertension Sister    Depression Sister    Depression  Brother    Sleep apnea Neg Hx     Social History Social History   Tobacco Use   Smoking status: Former    Packs/day: 2.00    Years: 20.00    Additional pack years: 0.00    Total pack years: 40.00    Types: Cigarettes    Quit date: 10/29/2004    Years since quitting: 18.1    Passive exposure: Past   Smokeless tobacco: Never  Vaping Use   Vaping Use: Never used  Substance Use Topics   Alcohol use: Not Currently    Comment: rarely has a glass of wine   Drug use: Never     Allergies   Contrast media [iodinated contrast media], Cephalexin, Iodine, Duloxetine, Pregabalin, Clarithromycin, and Spironolactone-hctz   Review of Systems Review of Systems  Gastrointestinal:  Positive for abdominal pain and diarrhea.     Physical Exam Triage Vital Signs ED Triage Vitals  Enc Vitals Group     BP 01/01/23 1810 (!) 144/80     Pulse Rate 01/01/23 1810 (!) 104     Resp 01/01/23 1810 (!) 22     Temp 01/01/23 1810 98.1 F (36.7 C)     Temp Source 01/01/23 1810 Oral     SpO2 01/01/23 1810 95 %     Weight --      Height --      Head Circumference --      Peak Flow --      Pain Score 01/01/23 1807 8     Pain Loc --      Pain Edu? --      Excl. in [place]? --    No data found.  Updated Vital Signs BP (!) 144/80 (BP Location: Left Arm)   Pulse (!) 104   Temp 98.1 F (36.7 C) (Oral)   Resp (!) 22   SpO2 95%   Visual Acuity Right Eye Distance:   Left Eye Distance:   Bilateral Distance:    Right Eye Near:   Left Eye Near:    Bilateral Near:     Physical Exam Vitals reviewed.  Constitutional:      Appearance: She is not toxic-appearing or diaphoretic.     Comments: Patient is in obvious pain holding her lower abdomen and rocking back and forth.  She has a pained facies.  HENT:     Mouth/Throat:     Mouth: Mucous membranes are moist.  Eyes:  Extraocular Movements: Extraocular movements intact.     Conjunctiva/sclera: Conjunctivae normal.     Pupils: Pupils are  equal, round, and reactive to light.  Cardiovascular:     Rate and Rhythm: Normal rate and regular rhythm.     Heart sounds: No murmur heard. Pulmonary:     Effort: Pulmonary effort is normal.     Breath sounds: Normal breath sounds.  Abdominal:     Palpations: There is no mass.     Comments: There is guarding on palpation of the abdomen and she is tender.  Musculoskeletal:     Cervical back: Neck supple.  Lymphadenopathy:     Cervical: No cervical adenopathy.  Skin:    Coloration: Skin is not jaundiced or pale.  Neurological:     General: No focal deficit present.     Mental Status: She is oriented to person, place, and time.      UC Treatments / Results  Labs (all labs ordered are listed, but only abnormal results are displayed) Labs Reviewed - No data to display  EKG   Radiology No results found.  Procedures Procedures (including critical care time)  Medications Ordered in UC Medications - No data to display  Initial Impression / Assessment and Plan / UC Course  I have reviewed the triage vital signs and the nursing notes.  Pertinent labs & imaging results that were available during my care of the patient were reviewed by me and considered in my medical decision making (see chart for details).        Due to her pain that I am most sure we cannot relieve her in the urgent care setting I have asked her to please proceed to the emergency room for further evaluation and treatment.  Also she is mildly tachycardic.  Blood pressure was okay.  She will proceed by private car with her husband driving to the emergency room Final Clinical Impressions(s) / UC Diagnoses   Final diagnoses:  Lower abdominal pain     Discharge Instructions      Please proceed to the ER    ED Prescriptions   None    PDMP not reviewed this encounter.   Barrett Henle, MD 01/01/23 [phone]

## 2023-01-01 NOTE — ED Notes (Signed)
Pt called for in lobby by this tech and CT tech, no response

## 2023-01-01 NOTE — ED Triage Notes (Signed)
Diarrhea starting Wends and belly pain. Some blood after it. Stopped this morning but still having pain in lower quadrants. Endorses nausea and 1 small episode of vomiting at beginning of diarrhea but gone. Denies hx of ABD issues and surgeries and has had 2 colonoscopies.

## 2023-01-01 NOTE — ED Provider Triage Note (Signed)
Emergency Medicine Provider Triage Evaluation Note  Lisa Good , a 72 y.o. female  was evaluated in triage.  Pt complains of abdominal pain and diarrhea.  Patient states she had onset of bloody diarrhea followed by regular diarrhea 3 days ago.  She has had persistent pain in her lower abdomen since that time.  She denies fevers but has had associated nausea without vomiting.  She denies history of the same.  Recent spinal cord stimulator placed..  Review of Systems  Positive: Abdominal pain  Negative: Feve  Physical Exam  BP (!) 146/87 (BP Location: Right Arm)   Pulse 98   Temp 98.5 F (36.9 C) (Oral)   Resp 20   SpO2 100%  Gen:   Awake, no distress   Resp:  Normal effort  MSK:   Moves extremities without difficulty  Other:    Medical Decision Making  Medically screening exam initiated at 7:20 PM.  Appropriate orders placed.  Lisa Good was informed that the remainder of the evaluation will be completed by another provider, this initial triage assessment does not replace that evaluation, and the importance of remaining in the ED until their evaluation is complete.     Margarita Mail, PA-C 01/01/23 1925

## 2023-01-01 NOTE — ED Notes (Signed)
Pt called for room multiple times, no response 

## 2023-01-01 NOTE — Discharge Instructions (Signed)
Please proceed to the ER 

## 2023-01-04 ENCOUNTER — Other Ambulatory Visit: Payer: Medicare Other

## 2023-01-04 ENCOUNTER — Encounter: Payer: Self-pay | Admitting: Neurosurgery

## 2023-01-04 ENCOUNTER — Encounter: Payer: Self-pay | Admitting: Urology

## 2023-01-06 NOTE — Progress Notes (Unsigned)
   REFERRING PHYSICIAN:  [name], [place] [address] [place],  [place] [address]  DOS: 12/25/22 SCS placement  HISTORY OF PRESENT ILLNESS: Lisa Good is approximately 1.5 weeks status post spinal cord stimulator placement. She was seen in the ER on 3/29 with abdominal pain and diarrhea.  She states that she did have a hard couple of weeks and has not appreciated any significant relief of her chronic pain with her stimulator yet however representative did tell her that is on the lowest setting.  She denies any incisional concerns or new symptoms.  PHYSICAL EXAMINATION:  General: Patient is well developed, well nourished, calm, collected, and in no apparent distress.   NEUROLOGICAL:  General: In no acute distress.   Awake, alert, oriented to person, place, and time.  Pupils equal round and reactive to light.  Facial tone is symmetric.   Strength:            Side Iliopsoas Quads Hamstring PF DF EHL  R 5 5 5 5 5 5   L 5 5 5 5 5 5    Incisions c/d/I and healing well   ROS (Neurologic):  Negative except as noted above  IMAGING: No interval imaging to review  ASSESSMENT/PLAN:  Lisa Good is doing fair approximately 1.5 weeks after placement of spinal cord stimulator.  I have refilled her oxycodone 5 mg 3 times daily as needed as requested.  Discussed activity escalation and I have advised the patient to lift up to 10 pounds until 6 weeks after surgery, then increase up to 25 pounds until 12 weeks after surgery.  After 12 weeks post-op, the patient advised to increase activity as tolerated. she will follow up in 4 weeks with Dr. Izora Ribas or sooner should she have any questions or concerns.   Advised to contact the office if any questions or concerns arise.  Cooper Render PA-C Department of neurosurgery

## 2023-01-07 ENCOUNTER — Other Ambulatory Visit: Payer: Medicare Other

## 2023-01-07 ENCOUNTER — Encounter: Payer: Self-pay | Admitting: Oncology

## 2023-01-07 ENCOUNTER — Encounter: Payer: Self-pay | Admitting: Neurosurgery

## 2023-01-07 ENCOUNTER — Ambulatory Visit (INDEPENDENT_AMBULATORY_CARE_PROVIDER_SITE_OTHER): Payer: Medicare Other | Admitting: Neurosurgery

## 2023-01-07 VITALS — BP 124/74 | Temp 98.3°F | Ht 65.0 in | Wt 207.0 lb

## 2023-01-07 DIAGNOSIS — M792 Neuralgia and neuritis, unspecified: Secondary | ICD-10-CM

## 2023-01-07 DIAGNOSIS — Z09 Encounter for follow-up examination after completed treatment for conditions other than malignant neoplasm: Secondary | ICD-10-CM

## 2023-01-07 DIAGNOSIS — G894 Chronic pain syndrome: Secondary | ICD-10-CM

## 2023-01-07 DIAGNOSIS — N3946 Mixed incontinence: Secondary | ICD-10-CM

## 2023-01-07 DIAGNOSIS — Z9689 Presence of other specified functional implants: Secondary | ICD-10-CM

## 2023-01-07 LAB — URINALYSIS, COMPLETE
Bilirubin, UA: NEGATIVE
Glucose, UA: NEGATIVE
Ketones, UA: NEGATIVE
Nitrite, UA: NEGATIVE
RBC, UA: NEGATIVE
Specific Gravity, UA: 1.025 (ref 1.005–1.030)
Urobilinogen, Ur: 0.2 mg/dL (ref 0.2–1.0)
pH, UA: 5 (ref 5.0–7.5)

## 2023-01-07 LAB — MICROSCOPIC EXAMINATION

## 2023-01-07 MED ORDER — OXYCODONE HCL 5 MG PO TABS: 5.0000 mg | ORAL_TABLET | Freq: Three times a day (TID) | ORAL | 0 refills | Status: AC | PRN

## 2023-01-08 NOTE — Anesthesia Postprocedure Evaluation (Signed)
Anesthesia Post Note  Patient: Lisa Good  Procedure(s) Performed: THORACIC LAMINECTOMY FOR SPINAL CORD STIMULATOR IMPLANTATION ([employer]) (Thoracic)  Patient location during evaluation: PACU Anesthesia Type: General Level of consciousness: awake and awake and alert Pain management: satisfactory to patient Vital Signs Assessment: post-procedure vital signs reviewed and stable Respiratory status: spontaneous breathing and nonlabored ventilation Cardiovascular status: stable Anesthetic complications: no   No notable events documented.   Last Vitals:  Vitals:   12/25/22 1559 12/25/22 1615  BP: 123/85 (!) 154/67  Pulse: 88 100  Resp: 14 18  Temp: (!) 36.1 C (!) 36.1 C  SpO2: 100% 93%    Last Pain:  Vitals:   12/25/22 1615  TempSrc: Temporal  PainSc: 3                  VAN STAVEREN,[name]

## 2023-01-11 ENCOUNTER — Telehealth: Payer: Self-pay

## 2023-01-11 ENCOUNTER — Ambulatory Visit (HOSPITAL_BASED_OUTPATIENT_CLINIC_OR_DEPARTMENT_OTHER)
Admission: RE | Admit: 2023-01-11 | Discharge: 2023-01-11 | Disposition: A | Discharge status: Home / Self Care | Payer: Medicare Other | Source: Ambulatory Visit | Attending: Neurosurgery | Admitting: Neurosurgery

## 2023-01-11 ENCOUNTER — Other Ambulatory Visit: Payer: Self-pay | Admitting: *Deleted

## 2023-01-11 DIAGNOSIS — Z9689 Presence of other specified functional implants: Secondary | ICD-10-CM | POA: Insufficient documentation

## 2023-01-11 MED ORDER — CIPROFLOXACIN HCL 500 MG PO TABS: ORAL_TABLET | ORAL | 0 refills | Status: DC

## 2023-01-11 NOTE — Telephone Encounter (Signed)
Spoke with patient and sent in cipro for BOTOX.

## 2023-01-11 NOTE — Telephone Encounter (Signed)
Ok to place order per discussion with Dr Myer Haff.  I have notified the patient. She will try to go to [facility] today.

## 2023-01-11 NOTE — Telephone Encounter (Signed)
Per Enrique Sack with [employer], Lisa Good cannot get the charger to connect and her system is critically low. Enrique Sack is asking if we can bring her in and take an xray to make sure the battery is sitting flat/to check the depth?

## 2023-01-11 NOTE — Telephone Encounter (Signed)
Per Dr Myer Haff, the battery does not appear flipped. We will bring her in tomorrow for the reps to meet with her to interrogate further.  Attempted to call pt x 3, but her phone goes straight to voicemail. Also sent mychart message.

## 2023-01-12 ENCOUNTER — Encounter: Payer: Self-pay | Admitting: Neurosurgery

## 2023-01-12 ENCOUNTER — Telehealth: Payer: Self-pay | Admitting: *Deleted

## 2023-01-12 ENCOUNTER — Ambulatory Visit (INDEPENDENT_AMBULATORY_CARE_PROVIDER_SITE_OTHER): Payer: Medicare Other | Admitting: Neurosurgery

## 2023-01-12 VITALS — BP 117/68 | HR 97 | Temp 98.3°F | Ht 64.0 in | Wt 207.0 lb

## 2023-01-12 DIAGNOSIS — Z9689 Presence of other specified functional implants: Secondary | ICD-10-CM

## 2023-01-12 DIAGNOSIS — G894 Chronic pain syndrome: Secondary | ICD-10-CM

## 2023-01-12 DIAGNOSIS — Z09 Encounter for follow-up examination after completed treatment for conditions other than malignant neoplasm: Secondary | ICD-10-CM

## 2023-01-12 DIAGNOSIS — M792 Neuralgia and neuritis, unspecified: Secondary | ICD-10-CM

## 2023-01-12 LAB — CULTURE, URINE COMPREHENSIVE

## 2023-01-12 MED ORDER — SULFAMETHOXAZOLE-TRIMETHOPRIM 800-160 MG PO TABS: 1.0000 | ORAL_TABLET | Freq: Two times a day (BID) | ORAL | 0 refills | Status: AC

## 2023-01-12 NOTE — Telephone Encounter (Signed)
-----   Message from Alfredo Martinez, MD sent at 01/12/2023  4:10 PM EDT ----- Doylene Bode DS 1 tablet twice a day for 7 days I am not certain but this patient may be having Botox next week-as long as she gets this antibiotic it is okay to proceed ----- Message ----- From: Sueanne Margarita, CMA Sent: 01/11/2023   8:33 AM EDT To: Alfredo Martinez, MD   ----- Message ----- From: Interface, [facility] Lab Results In Sent: 01/07/2023   4:36 PM EDT To: Sueanne Margarita, CMA

## 2023-01-12 NOTE — Progress Notes (Signed)
   REFERRING PHYSICIAN:  No referring provider defined for this encounter.  DOS: 12/25/22 SCS placement  HISTORY OF PRESENT ILLNESS: Courtnei Omeara is status post spinal cord stimulator placement. She was seen in the ER on 3/29 with abdominal pain and diarrhea.   She is having trouble we will getting her battery to recharge.  The device is very helpful with her foot pain. PHYSICAL EXAMINATION:  General: Patient is well developed, well nourished, calm, collected, and in no apparent distress.   NEUROLOGICAL:  General: In no acute distress.   Awake, alert, oriented to person, place, and time.  Pupils equal round and reactive to light.  Facial tone is symmetric.   Strength:            Side Iliopsoas Quads Hamstring PF DF EHL  R 5 5 5 5 5 5   L 5 5 5 5 5 5    Incisions c/d/I and healing well  I can palpate her battery.  ROS (Neurologic):  Negative except as noted above  IMAGING: X-rays from yesterday reviewed.  ASSESSMENT/PLAN:  Akeiba Lubrano is doing well after placement of spinal cord stimulator.    We cannot get her battery to communicate with the recharge her today.  I was able to palpate the device.  It is possible she either has a fluid pocket around her battery or has ongoing swelling related to surgery.  Either way, this should improve over time.  Will see her back in 2 to 3 weeks to determine whether the device has begun to read again.  If not, we will discuss whether revision of her battery is a reasonable approach.  Venetia Night Department of neurosurgery

## 2023-01-12 NOTE — Telephone Encounter (Signed)
Left detailed message on machine, advised pt that I am sending in rx and to get started before her Botox. rx sent to pharmacy by e-script  Asked pt to return call if she has any questions

## 2023-01-13 ENCOUNTER — Ambulatory Visit: Payer: Medicare Other | Admitting: Family Medicine

## 2023-01-14 ENCOUNTER — Telehealth: Payer: Self-pay | Admitting: Neurosurgery

## 2023-01-14 NOTE — Telephone Encounter (Signed)
SCS implant on 12/25/22  She was seen on Tuesday, she is asking for an appt next week with Dr.Yarbrough. she said he told her to come in next week but office note states 2-3 weeks. She is in pain and if the stimulator is not working why can't you just switch it out. She states the stimulator is currently off. Would you like to work her in next week?

## 2023-01-14 NOTE — Telephone Encounter (Signed)
I spoke with Baxter Hire from [employer]. She will plan to meet with her next week to try again and then touch base with [place]. Per Baxter Hire, if Ms Tramble goes back to the OR, she will have to restart her settings at the lowest level.  I spoke with the patient about the above. I strongly encouraged her to try to allow more time for the swelling to go down to see if her device will reconnect to try to avoid having to return to the OR.

## 2023-01-18 ENCOUNTER — Ambulatory Visit (INDEPENDENT_AMBULATORY_CARE_PROVIDER_SITE_OTHER): Payer: Medicare Other | Admitting: Urology

## 2023-01-18 ENCOUNTER — Encounter: Payer: Self-pay | Admitting: Urology

## 2023-01-18 VITALS — BP 136/76 | HR 106 | Ht 64.0 in | Wt 207.0 lb

## 2023-01-18 DIAGNOSIS — N3946 Mixed incontinence: Secondary | ICD-10-CM

## 2023-01-18 LAB — URINALYSIS, COMPLETE
Bilirubin, UA: NEGATIVE
Glucose, UA: NEGATIVE
Ketones, UA: NEGATIVE
Leukocytes,UA: NEGATIVE
Nitrite, UA: NEGATIVE
Protein,UA: NEGATIVE
RBC, UA: NEGATIVE
Specific Gravity, UA: >1.03 — ABNORMAL HIGH (ref 1.005–1.030)
Urobilinogen, Ur: 0.2 mg/dL (ref 0.2–1.0)
pH, UA: 5.5 (ref 5.0–7.5)

## 2023-01-18 LAB — MICROSCOPIC EXAMINATION: Epithelial Cells (non renal): >10 /hpf — AB (ref 0–10)

## 2023-01-18 MED ORDER — TRIMETHOPRIM 100 MG PO TABS: 100.0000 mg | ORAL_TABLET | Freq: Every day | ORAL | 11 refills | Status: DC

## 2023-01-18 MED ORDER — ONABOTULINUMTOXINA 100 UNITS IJ SOLR
100.0000 [IU] | Freq: Once | INTRAMUSCULAR | Status: AC
  Administered 2023-01-18: 100 [IU] via INTRAMUSCULAR

## 2023-01-18 NOTE — Progress Notes (Signed)
01/18/2023 1:40 PM   Lisa Good Jun 15, 1951 [id number]  Referring provider: Patrice Paradise, MD [address] [place]  [place] [address]  Chief Complaint  Patient presents with   Botulinum Toxin Injection    HPI: I was consulted to assess the patient is urinary incontinence.  She has had 2 synthetic slings and one pubovaginal sling in [place].  She might leak with coughing sneezing with a hard cough but otherwise has no stress incontinence.  She is urge incontinence worse when she goes from a supine to standing position she can soak a pad.  She wears a 2 pads a day.  The positional changes significant trigger.  No bedwetting   She voids every 3-4 hours gets up twice at night   She recently had neck surgery and needs another back operation.  She has had 2 bladder infections in the last 6 months.   By history she recently failed Ditropan and Myrbetriq      well supported bladder neck and negative cough test.  Patient had about a 5 cm vaginal length.  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  I felt some scarring near the cuff.  She had no stress incontinence   Likely patient likely has primarily an overactive bladder with triggering and possibly mild stress incontinence.     Patient had back surgery and did not follow-up.  Last appointment more than a year ago.  She has had surgery on her neck mid back and low back.   She has urge incontinence.  Main symptom is she has high-volume leakage when she goes from a supine to standing position.  She leaks a small amount with coughing sneezing.  No bedwetting. She wears 2 pads a day with a varying amount of leakage   Voids every 2-3 hours gets up 3-4 times a night.   Cystoscopy: normal   Today Patient voided 63 mL during urodynamics with a maximum flow 11 mils per second and had a residual of 100 mL.  Maximum bladder capacity was 237 mL.  Bladder was unstable reaching pressure 4 cm of  water.  She felt urgency and leaked.  At 200 mL she had mild leakage at 38 cm of water.  With a Valsalva maneuver at the same volume she leaked a mild amount at 70 cm of water.  At 263 mL she had moderate severe leakage at 64 cm of water.  During voluntary voiding she voided 247 mL with a max of flow 12 mils per second.  Maximum voiding pressure was 31 cm water.  She emptied well with a residual 26 mL.  EMG activity increased during voiding.  She did have a lot of urgency during the study.     Patient has mixed incontinence.  Approximately 80% of her voiding dysfunction is due to an overactive bladder with urge incontinence.  She has very mild stress incontinence.  I will see her back on the new beta 3 agonist.  If this fails I will offer all 4 third line therapies to her.    Patient has peripheral neuropathy   80% improved on the new beta 3 agonist.  Not sure if it aggravated an upper respiratory tract infection and not related to vague abdominal discomfort in my opinion   Patient dramatically better with 80 to 90% reduction urge incontinence improving quality life new beta 3 agonist.    Today Patient cannot afford the full price Gemtesa.  Tolterodine is  approximately $80 per month and not working well.  Clinically not infected.  Frequency stable.   Went over all 3 refractory treatments with full templates.   Patient would like to proceed with Botox. 3 days ciprofloxacin prescription given. She lives in [facility] and does not want to do percutaneous tibial nerve stimulation. She has had a lot of back surgery.      Today Frequency stable.  Incontinence stable.  Clinically not infected.  Took ciprofloxacin as per protocol Significant worsening of vagina Cystoscopy: Patient underwent flexible cystoscopy.  Bladder mucosa and trigone were normal.  No cystitis.  I injected 100 units of Botox and 10 cc of normal saline with more of the modified midline and low midline template and to the patient's  left side.  Minimal bleeding.  She was a bit uncomfortable but did very well     They Botox treatment was September 21, 2022.  She was doing reasonably well but in January 4 she presented with cystitis.  Residual was 0 mL.  Culture was positive He has good days and bad days with a 40% improvement.  Sometimes 2 pads other x 8 pads.  Urine a bit cloudy but no pain We decided to do another Botox in about 8 to 10 weeks.  We will relook at its efficacy and may consider other therapies like InterStim in the future.  She just had a back stimulator.  Call if culture positive  Today Frequency stable.  Last culture positive.  Last 3 cultures in the last 6 months positive. Clinically not infected.  She had a positive culture this week and she is on Septra.  She has never been on prophylaxis.  Incontinence stable  Cystoscopy: Patient underwent flexible cystoscopy.  Bladder mucosa and trigone were normal.  She is injected 100 units of Botox and 10 cc in normal saline in the lower half of the bladder.  She tolerated it well though some were more tender.      PMH: Past Medical History:  Diagnosis Date   Allergy    seasonal   Arthritis    Cerebral aneurysm    Chronic kidney disease    Chronic pain syndrome    Complication of anesthesia    trouble waking up after back surgery   DDD (degenerative disc disease), lumbar    Depression    DVT (deep venous thrombosis) 2002   Fibromyalgia    GERD (gastroesophageal reflux disease)    History of kidney stones    Hyperlipidemia    Hypertension    Hypothyroidism    Incontinence in female    Iron deficiency anemia 11/01/2019   Irregular heart beat    Neuromuscular disorder    peripheral neuropathy/feet   Obesity    OSA on CPAP    Pancolitis    Pre-diabetes    RLS (restless legs syndrome)    UTI (urinary tract infection) 11/2022    Surgical History: Past Surgical History:  Procedure Laterality Date   BRAIN SURGERY  2002   aneurysm   CEREBRAL  ANEURYSM REPAIR  2002   COLONOSCOPY WITH PROPOFOL N/A 11/10/2019   Procedure: COLONOSCOPY WITH PROPOFOL;  Surgeon: Wyline Mood, MD;  Location: [facility];  Service: Endoscopy;  Laterality: N/A;   ESOPHAGOGASTRODUODENOSCOPY (EGD) WITH PROPOFOL N/A 11/10/2019   Procedure: ESOPHAGOGASTRODUODENOSCOPY (EGD) WITH PROPOFOL;  Surgeon: Wyline Mood, MD;  Location: [facility];  Service: Endoscopy;  Laterality: N/A;   EXTRACORPOREAL SHOCK WAVE LITHOTRIPSY Right 04/27/2019   Procedure: EXTRACORPOREAL SHOCK  WAVE LITHOTRIPSY (ESWL);  Surgeon: Sondra Come, MD;  Location: [facility] OR[facility];  Service: Urology;  Laterality: Right;   JOINT REPLACEMENT Right    knee   KNEE ARTHROPLASTY Left 01/12/2020   Procedure: COMPUTER ASSISTED TOTAL KNEE ARTHROPLASTY;  Surgeon: Donato Heinz, MD;  Location: [facility] ORS;  Service: Orthopedics;  Laterality: Left;   POLYPECTOMY  11/10/2019   Procedure: POLYPECTOMY;  Surgeon: Wyline Mood, MD;  [facility];  Service: Endoscopy;;   REVERSE SHOULDER ARTHROPLASTY Right 01/06/2022   Procedure: REVERSE SHOULDER ARTHROPLASTY;  Surgeon: Christena Flake, MD;  Location: [facility] ORS;  Service: Orthopedics;  Laterality: Right;   SPINAL FUSION  07/10/2019   C4-5 corpectomy with C6-7 ACDF, C2-T3 spinal fusion   SPINE SURGERY     spinal fusion   THORACIC LAMINECTOMY FOR SPINAL CORD STIMULATOR N/A 12/25/2022   Procedure: THORACIC LAMINECTOMY FOR SPINAL CORD STIMULATOR IMPLANTATION ([employer]);  Surgeon: Venetia Night, MD;  Location: [facility] ORS;  Service: Neurosurgery;  Laterality: N/A;   TUBAL LIGATION      Home Medications:  Allergies as of 01/18/2023       Reactions   Contrast Media [iodinated Contrast Media] Hives   Cephalexin Nausea Only, Other (See Comments)   Pt has taken since with no problems   Iodine Hives   Duloxetine Swelling   Legs and feet   Pregabalin Swelling   Legs and feet   Clarithromycin Nausea Only, Other (See Comments)    Spironolactone-hctz Rash   Only with iv injection        Medication List        Accurate as of January 18, 2023  1:40 PM. If you have any questions, ask your nurse or doctor.          Alpha-Lipoic Acid 600 MG Caps Take 600 mg by mouth 2 (two) times daily.   amLODipine 10 MG tablet Commonly known as: NORVASC Take 10 mg by mouth at bedtime.   atorvastatin 40 MG tablet Commonly known as: LIPITOR Take 40 mg by mouth every morning.   ciprofloxacin 500 MG tablet Commonly known as: CIPRO Take 1 tablet the day before, 1 tablet the day of, 1 tablet the day after BOTOX   famotidine 20 MG tablet Commonly known as: PEPCID Take 20 mg by mouth daily as needed for heartburn or indigestion.   gabapentin 800 MG tablet Commonly known as: NEURONTIN Take 1 tablet (800 mg total) by mouth 4 (four) times daily. What changed:  when to take this additional instructions   ibuprofen 200 MG tablet Commonly known as: ADVIL Take 400-600 mg by mouth as needed for moderate pain.   ipratropium 0.06 % nasal spray Commonly known as: ATROVENT Place 2 sprays into both nostrils 3 (three) times daily. As needed for nasal congestion, runny nose   lisinopril 40 MG tablet Commonly known as: ZESTRIL Take 40 mg by mouth at bedtime.   methocarbamol 500 MG tablet Commonly known as: ROBAXIN Take 1 tablet (500 mg total) by mouth 4 (four) times daily.   OVER THE COUNTER MEDICATION Apply 1 application. topically 3 (three) times daily as needed (pain). Outback original pain relieving oil, apply to feet   oxyCODONE-acetaminophen 10-325 MG tablet Commonly known as: PERCOCET Take 1 tablet by mouth 4 (four) times daily.   pantoprazole 40 MG tablet Commonly known as: PROTONIX Take 40 mg by mouth every morning.   sulfamethoxazole-trimethoprim 800-160 MG tablet Commonly known as: Bactrim DS Take 1 tablet by mouth 2 (two) times  daily for 7 days.   traZODone 100 MG tablet Commonly known as: DESYREL Take  1 tablet by mouth at bedtime.   venlafaxine XR 150 MG 24 hr capsule Commonly known as: EFFEXOR-XR Take 150 mg by mouth daily with breakfast. Take with 75 mg to equal 225 mg daily   venlafaxine XR 75 MG 24 hr capsule Commonly known as: EFFEXOR-XR Take 75 mg by mouth daily with breakfast. Take with 150 mg to equal 225 mg daily   XYLITOL MT Use as directed 2 tablets in the mouth or throat daily as needed (dry mouth).        Allergies:  Allergies  Allergen Reactions   Contrast Media [Iodinated Contrast Media] Hives   Cephalexin Nausea Only and Other (See Comments)    Pt has taken since with no problems   Iodine Hives   Duloxetine Swelling    Legs and feet   Pregabalin Swelling    Legs and feet   Clarithromycin Nausea Only and Other (See Comments)   Spironolactone-Hctz Rash    Only with iv injection    Family History: Family History  Problem Relation Age of Onset   Hypertension Mother    Hyperlipidemia Sister    Hypertension Sister    Depression Sister    Depression Brother    Sleep apnea Neg Hx     Social History:  reports that she quit smoking about 18 years ago. Her smoking use included cigarettes. She has a 40.00 pack-year smoking history. She has been exposed to tobacco smoke. She has never used smokeless tobacco. She reports that she does not currently use alcohol. She reports that she does not use drugs.  ROS:                                        Physical Exam: There were no vitals taken for this visit.  Constitutional:  Alert and oriented, No acute distress. HEENT: [place] AT, moist mucus membranes.  Trachea midline, no masses.  Laboratory Data: Lab Results  Component Value Date   WBC 7.1 01/01/2023   HGB 9.6 (L) 01/01/2023   HCT 31.9 (L) 01/01/2023   MCV 79.9 (L) 01/01/2023   PLT 304 01/01/2023    Lab Results  Component Value Date   CREATININE 0.97 01/01/2023    No results found for: "PSA"  No results found for:  "TESTOSTERONE"  No results found for: "HGBA1C"  Urinalysis    Component Value Date/Time   COLORURINE YELLOW 01/01/2023 1843   APPEARANCEUR Hazy (A) 01/07/2023 1336   LABSPEC 1.014 01/01/2023 1843   PHURINE 5.0 01/01/2023 1843   GLUCOSEU Negative 01/07/2023 1336   HGBUR NEGATIVE 01/01/2023 1843   BILIRUBINUR Negative 01/07/2023 1336   KETONESUR NEGATIVE 01/01/2023 1843   PROTEINUR 1+ (A) 01/07/2023 1336   PROTEINUR NEGATIVE 01/01/2023 1843   UROBILINOGEN 0.2 07/26/2022 1550   NITRITE Negative 01/07/2023 1336   NITRITE NEGATIVE 01/01/2023 1843   LEUKOCYTESUR 1+ (A) 01/07/2023 1336   LEUKOCYTESUR MODERATE (A) 01/01/2023 1843    Pertinent Imaging:   Assessment & Plan: The patient and I spoke about urinary prophylaxis and I will put her on trimethoprim 100 mg 3 x 11.  See nurse practitioners per protocol.  Follow-up with myself in about 4 months on prophylaxis.  Perhaps this will help improve the efficacy of her second Botox  1. Mixed incontinence  - Urinalysis, Complete  No follow-ups on file.  Reece Packer, MD  [facility] [address] [place], [place] [address] [phone]

## 2023-01-20 ENCOUNTER — Encounter: Payer: Self-pay | Admitting: Nurse Practitioner

## 2023-01-20 ENCOUNTER — Ambulatory Visit (INDEPENDENT_AMBULATORY_CARE_PROVIDER_SITE_OTHER): Payer: Medicare Other | Admitting: Nurse Practitioner

## 2023-01-20 VITALS — BP 120/62 | HR 93 | Temp 96.7°F | Ht 64.0 in | Wt 217.6 lb

## 2023-01-20 DIAGNOSIS — F32A Depression, unspecified: Secondary | ICD-10-CM

## 2023-01-20 DIAGNOSIS — G894 Chronic pain syndrome: Secondary | ICD-10-CM

## 2023-01-20 DIAGNOSIS — I10 Essential (primary) hypertension: Secondary | ICD-10-CM

## 2023-01-20 DIAGNOSIS — F419 Anxiety disorder, unspecified: Secondary | ICD-10-CM

## 2023-01-20 DIAGNOSIS — F5101 Primary insomnia: Secondary | ICD-10-CM

## 2023-01-20 DIAGNOSIS — G629 Polyneuropathy, unspecified: Secondary | ICD-10-CM

## 2023-01-20 DIAGNOSIS — E7800 Pure hypercholesterolemia, unspecified: Secondary | ICD-10-CM | POA: Diagnosis not present

## 2023-01-20 DIAGNOSIS — N393 Stress incontinence (female) (male): Secondary | ICD-10-CM

## 2023-01-20 MED ORDER — TRAZODONE HCL 150 MG PO TABS: 150.0000 mg | ORAL_TABLET | Freq: Every day | ORAL | 0 refills | Status: DC

## 2023-01-20 NOTE — Progress Notes (Unsigned)
New Patient Visit  BP 120/62 (BP Location: Right Arm)   Pulse 93   Temp (!) 96.7 F (35.9 C)   Ht 5\' 4"  (1.626 m)   Wt 217 lb 9.6 oz (98.7 kg)   SpO2 94%   BMI 37.35 kg/m    Subjective:    Patient ID: Lisa Good, female    DOB: Jan 22, 1951, 72 y.o.   MRN: [id number]  CC: Chief Complaint  Patient presents with   Establish Care    NP. Est Care, concerns with Insomnia    HPI: Lisa Good is a 72 y.o. female presents  Spinal cord stimulator. She has peripheral neuropathy. She states that this helped initially. However now, she has tried several times to charge it and it is not charging. She states her surgeon may have to change it out since it is not charging.   She has not been able to sleep. She states this has been going on for a while. She is taking trazodone, however is not helping as much as it used to.   Past Medical History:  Diagnosis Date   Allergy    seasonal   Arthritis    Cataract    Cerebral aneurysm    Chronic kidney disease    Chronic pain syndrome    Complication of anesthesia    trouble waking up after back surgery   DDD (degenerative disc disease), lumbar    Depression    DVT (deep venous thrombosis) 2002   Fibromyalgia    GERD (gastroesophageal reflux disease)    Heart murmur    Heart dr said normal for me   History of kidney stones    Hyperlipidemia    Hypertension    Hypothyroidism    Incontinence in female    Iron deficiency anemia 11/01/2019   Irregular heart beat    Neuromuscular disorder    peripheral neuropathy/feet   Obesity    OSA on CPAP    Pancolitis    Pre-diabetes    RLS (restless legs syndrome)    UTI (urinary tract infection) 11/2022    Past Surgical History:  Procedure Laterality Date   BRAIN SURGERY  2002   aneurysm   CEREBRAL ANEURYSM REPAIR  2002   COLONOSCOPY WITH PROPOFOL N/A 11/10/2019   Procedure: COLONOSCOPY WITH PROPOFOL;  Surgeon: Wyline Mood, MD;  [facility];  Service: Endoscopy;   Laterality: N/A;   COSMETIC SURGERY     ESOPHAGOGASTRODUODENOSCOPY (EGD) WITH PROPOFOL N/A 11/10/2019   Procedure: ESOPHAGOGASTRODUODENOSCOPY (EGD) WITH PROPOFOL;  Surgeon: Wyline Mood, MD;  Location: [facility];  Service: Endoscopy;  Laterality: N/A;   EXTRACORPOREAL SHOCK WAVE LITHOTRIPSY Right 04/27/2019   Procedure: EXTRACORPOREAL SHOCK WAVE LITHOTRIPSY (ESWL);  Surgeon: Sondra Come, MD;  Location: [facility] OR[facility];  Service: Urology;  Laterality: Right;   EYE SURGERY     JOINT REPLACEMENT Bilateral    knee   KNEE ARTHROPLASTY Left 01/12/2020   Procedure: COMPUTER ASSISTED TOTAL KNEE ARTHROPLASTY;  Surgeon: Donato Heinz, MD;  Location: [facility] OR[facility];  Service: Orthopedics;  Laterality: Left;   POLYPECTOMY  11/10/2019   Procedure: POLYPECTOMY;  Surgeon: Wyline Mood, MD;  Location[facility];  Service: Endoscopy;;   REVERSE SHOULDER ARTHROPLASTY Right 01/06/2022   Procedure: REVERSE SHOULDER ARTHROPLASTY;  Surgeon: Christena Flake, MD;  Location: [facility] OR[facility];  Service: Orthopedics;  Laterality: Right;   SPINAL FUSION  07/10/2019   C4-5 corpectomy with C6-7 ACDF, C2-T3 spinal fusion   SPINE SURGERY  spinal fusion   THORACIC LAMINECTOMY FOR SPINAL CORD STIMULATOR N/A 12/25/2022   Procedure: THORACIC LAMINECTOMY FOR SPINAL CORD STIMULATOR IMPLANTATION ([employer]);  Surgeon: Venetia Night, MD;  Location: [facility] OR[facility];  Service: Neurosurgery;  Laterality: N/A;   TUBAL LIGATION      Family History  Problem Relation Age of Onset   Hypertension Mother    Depression Mother    Diabetes Mother    Varicose Veins Mother    Hyperlipidemia Sister    Hypertension Sister    Depression Sister    Varicose Veins Sister    Depression Brother    Arthritis Maternal Grandmother    Diabetes Maternal Grandmother    Sleep apnea Neg Hx      Social History   Tobacco Use   Smoking status: Former    Packs/day: 1.50    Years: 20.00    Additional pack years: 0.00    Total pack  years: 30.00    Types: Cigarettes    Quit date: 10/29/2004    Years since quitting: 18.2    Passive exposure: Past   Smokeless tobacco: Never  Vaping Use   Vaping Use: Never used  Substance Use Topics   Alcohol use: Not Currently    Comment: Rare social drinking   Drug use: Never    Current Outpatient Medications on File Prior to Visit  Medication Sig Dispense Refill   Alpha-Lipoic Acid 600 MG CAPS Take 600 mg by mouth 2 (two) times daily.     amLODipine (NORVASC) 10 MG tablet Take 10 mg by mouth at bedtime.     atorvastatin (LIPITOR) 40 MG tablet Take 40 mg by mouth every morning.     gabapentin (NEURONTIN) 800 MG tablet Take 1 tablet (800 mg total) by mouth 4 (four) times daily. (Patient taking differently: Take 800 mg by mouth 3 (three) times daily. 1 in am, 1 at lunch and 2 at bedtime) 120 tablet 3   ibuprofen (ADVIL) 200 MG tablet Take 400-600 mg by mouth as needed for moderate pain.     lisinopril (PRINIVIL,ZESTRIL) 40 MG tablet Take 40 mg by mouth at bedtime.     methocarbamol (ROBAXIN) 500 MG tablet Take 1 tablet (500 mg total) by mouth 4 (four) times daily. 120 tablet 0   oxyCODONE-acetaminophen (PERCOCET) 10-325 MG tablet Take 1 tablet by mouth 4 (four) times daily.     pantoprazole (PROTONIX) 40 MG tablet Take 40 mg by mouth every morning.     traZODone (DESYREL) 100 MG tablet Take 1 tablet by mouth at bedtime.     trimethoprim (TRIMPEX) 100 MG tablet Take 1 tablet (100 mg total) by mouth daily. 30 tablet 11   venlafaxine XR (EFFEXOR-XR) 150 MG 24 hr capsule Take 150 mg by mouth daily with breakfast. Take with 75 mg to equal 225 mg daily     venlafaxine XR (EFFEXOR-XR) 75 MG 24 hr capsule Take 75 mg by mouth daily with breakfast. Take with 150 mg to equal 225 mg daily     XYLITOL MT Use as directed 2 tablets in the mouth or throat daily as needed (dry mouth).     ipratropium (ATROVENT) 0.06 % nasal spray Place 2 sprays into both nostrils 3 (three) times daily. As needed for  nasal congestion, runny nose (Patient not taking: Reported on 01/20/2023) 15 mL 1   OVER THE COUNTER MEDICATION Apply 1 application. topically 3 (three) times daily as needed (pain). Outback original pain relieving oil, apply to feet  No current facility-administered medications on file prior to visit.     Review of Systems      Objective:    BP 120/62 (BP Location: Right Arm)   Pulse 93   Temp (!) 96.7 F (35.9 C)   Ht  (1.626 m)   Wt 217 lb 9.6 oz (98.7 kg)   SpO2 94%   BMI 37.35 kg/m   Wt Readings from Last 3 Encounters:  01/20/23 217 lb 9.6 oz (98.7 kg)  01/18/23 207 lb (93.9 kg)  01/12/23 207 lb (93.9 kg)    BP Readings from Last 3 Encounters:  01/20/23 120/62  01/18/23 136/76  01/12/23 117/68    Physical Exam     Assessment & Plan:   Problem List Items Addressed This Visit   None    Follow up plan: No follow-ups on file.

## 2023-01-20 NOTE — Patient Instructions (Signed)
It was great to see you!  Increase your trazodone to  daily. I have sent in the new dose to your pharmacy.   Let's follow-up in 3 months, sooner if you have concerns.  If a referral was placed today, you will be contacted for an appointment. Please note that routine referrals can sometimes take up to 3-4 weeks to process. Please call our office if you haven't heard anything after this time frame.  Take care,  Rodman Pickle, NP

## 2023-01-21 ENCOUNTER — Telehealth: Payer: Self-pay

## 2023-01-21 DIAGNOSIS — F419 Anxiety disorder, unspecified: Secondary | ICD-10-CM | POA: Insufficient documentation

## 2023-01-21 NOTE — Assessment & Plan Note (Signed)
Chronic, ongoing.  She is taking gabapentin 800 mg 4 times a day and Percocet 10-3 25 4  times a day.  She is currently following with pain management and also has a spinal cord stimulator in place.  They are having issues with getting it charged at this point, she has a follow-up appointment this week with pain management.

## 2023-01-21 NOTE — Assessment & Plan Note (Signed)
Chronic, stable. Continue atorvastatin 40mg daily. 

## 2023-01-21 NOTE — Assessment & Plan Note (Signed)
Chronic, stable.  Continue amlodipine 10 mg daily and lisinopril 40 mg daily.  Recent CMP, CBC reviewed.  Follow-up in 3 months.

## 2023-01-21 NOTE — Telephone Encounter (Signed)
Lisa Good with [employer] contacted me today. She met with [name] again and they are still unable to connect her charger. She is currently scheduled to see Dr Myer Haff again on 02/09/23. Dr Myer Haff said she can move up to a sooner appointment if she would like. Alternatively, she can keep the appointment for 5/7 if she would like to give it more time to see if it resolves.  If she chooses to change the appointment, please let me know so I can notify the rep that her appointment changed. Thanks

## 2023-01-21 NOTE — Assessment & Plan Note (Signed)
Chronic, not controlled.  She states that the trazodone was helping at first, now her body has gotten gotten used to it.  Will increase her trazodone to 150 mg daily at bedtime as needed.  Discussed sleep hygiene as well.  Follow-up in 3 months.

## 2023-01-21 NOTE — Assessment & Plan Note (Signed)
Chronic, ongoing.  She is currently taking venlafaxine 225 mg daily.  Continue this regimen and follow-up in 3 months.

## 2023-01-21 NOTE — Telephone Encounter (Signed)
Notified Kristen with [employer] of new appt

## 2023-01-21 NOTE — Assessment & Plan Note (Addendum)
Chronic, not controlled.  She is currently taking gabapentin 800 mg 4 times a day.  She had a spinal cord stimulator inserted and states that this helped significantly with the pain, however now they are having trouble recharging the battery.  She may need to have surgery to have this replaced.  Continue collaboration recommendations from specialist.

## 2023-01-21 NOTE — Assessment & Plan Note (Signed)
She is currently following with urology and is getting Botox injections into her bladder.  Continue collaboration recommendations from urology.

## 2023-01-25 ENCOUNTER — Telehealth: Payer: Self-pay

## 2023-01-25 NOTE — Telephone Encounter (Signed)
Mrs Zaman called in regarding clarification about her appointment on 02/02/23. I explained that this was instead of her appointment on 02/09/23. She was under the impression that 02/02/23 was the date of her procedure to revise her battery. I explained that 4/30 was an appointment for another attempt to get her battery to connect. She expressed dissatisfaction with this, as she is in pain and just wants to go to the operating room to fix the battery. I explained that Dr Myer Haff wanted to allow more time to see if the fluid around her battery would dissipate, which may be the issue. I also explained in detail that going back to the operating room poses risks. I informed her that Dr Myer Haff previously informed me that if we cannot get the battery to connect at her next appointment, that he will likely offer to take her back to the OR for revision.  She expressed that she is still in a lot of pain despite taking oxycodone-acetaminophen 10-325mg  from her pain management provider Arsenio Loader, NP at [facility]. I asked if she needed a refill of breakthrough pain medicine from Dr Myer Haff, but she states she doesn't believe [facility] would allow this. I offered to contact [facility] to inquire about this. I have sent [facility] an outside message about this and I am waiting on a response.   [facility]

## 2023-01-25 NOTE — Telephone Encounter (Addendum)
I spoke with Mrs Lasorsa and she agreed to come in tomorrow. She is aware that I have not received a response from her pain management provider at this time. I notified Baxter Hire with [employer] of the appointment change.

## 2023-01-25 NOTE — Telephone Encounter (Signed)
Discussed with Dr Myer Haff. Will offer pt to come in tomorrow instead of 02/02/23.

## 2023-01-25 NOTE — Telephone Encounter (Signed)
RE: medication for breakthrough pain Received: Today   Brunson-Ollison, [facility])  Sharlot Gowda, RN     Hi Samaj Wessells, Yes, this is perfectly fine. Thank you for the update. Hewitt Shorts- Ollison DNP, ACAGNP-BC Previous Messages  ----- Message ----- From: Sharlot Gowda [facility]) Sent: 01/25/2023  10:42 AM EDT To: Lavinia Sharps, NP [facility]) Subject: medication for breakthrough pain  Hi Alona Bene,  I hope you are well. Mrs Pepperman had a thoracic laminectomy for spinal cord stimulator placement by Dr Myer Haff on 12/25/22. Despite multiple attempts and meetings with the [employer] rep, she has not been able to use the device yet, as she cannot get it to charge due to a potential fluid pocket around the battery. We are bringing her back into clinic with the [facility] [employer] rep on 02/02/23 to try again. If it still won't connect at that time, Dr Myer Haff may offer to bring her back to the OR to revise her battery.  Mrs Reihl has expressed that she is still in a lot of pain because she cannot use her device and I offered to ask Dr Myer Haff to refill her pain medication for breakthrough pain. However, she is concerned that this would break her pain contract with you. She is currently taking oxycodone-acetaminophen 10-325 from you.  Would it be OK for Dr Myer Haff to refill her medication for breakthrough pain at this time?   Thank you,  -Royston Cowper, RN [facility] Neurosurgery at [facility] of Dr Venetia Night, Manning Charity, PA-C, [place], [place] Ph: [phone]

## 2023-01-26 ENCOUNTER — Encounter: Payer: Medicare Other | Admitting: Neurosurgery

## 2023-01-26 ENCOUNTER — Ambulatory Visit (INDEPENDENT_AMBULATORY_CARE_PROVIDER_SITE_OTHER): Payer: Medicare Other | Admitting: Neurosurgery

## 2023-01-26 ENCOUNTER — Other Ambulatory Visit: Payer: Self-pay

## 2023-01-26 VITALS — BP 169/84 | HR 106 | Temp 98.3°F | Ht 64.0 in | Wt 215.0 lb

## 2023-01-26 DIAGNOSIS — Z09 Encounter for follow-up examination after completed treatment for conditions other than malignant neoplasm: Secondary | ICD-10-CM

## 2023-01-26 DIAGNOSIS — T85192S Other mechanical complication of implanted electronic neurostimulator (electrode) of spinal cord, sequela: Secondary | ICD-10-CM

## 2023-01-26 DIAGNOSIS — G894 Chronic pain syndrome: Secondary | ICD-10-CM

## 2023-01-26 DIAGNOSIS — Z01818 Encounter for other preprocedural examination: Secondary | ICD-10-CM

## 2023-01-26 DIAGNOSIS — Z9689 Presence of other specified functional implants: Secondary | ICD-10-CM

## 2023-01-26 DIAGNOSIS — M792 Neuralgia and neuritis, unspecified: Secondary | ICD-10-CM

## 2023-01-26 MED ORDER — OXYCODONE HCL 5 MG PO TABS: 5.0000 mg | ORAL_TABLET | ORAL | 0 refills | Status: DC | PRN

## 2023-01-26 NOTE — Patient Instructions (Signed)
Please see below for information in regards to your upcoming surgery:   Planned surgery: internal pulse generator replacement ([employer])   Surgery date: 02/01/23 - you will find out your arrival time the business day before your surgery.   Pre-op appointment at [facility] Pre-admit Testing: we will schedule a phone interview    We can be reached by phone or mychart 8am-4pm, Monday-Friday. If you have any questions/concerns before or after surgery, you can reach [place] at [phone], or you can send a mychart message. If you have a concern after hours that cannot wait until normal business hours, you can call [phone] and ask to page the neurosurgeon on call for [place].   Appointments/FMLA & disability paperwork: Patty [employer] Cristin  Nurse: Royston Cowper  Medical assistants: Laurann Montana Physician Assistant's: Manning Charity & Drake Leach Surgeon: Venetia Night, MD

## 2023-01-26 NOTE — Addendum Note (Signed)
Addended by: Venetia Night on: 01/26/2023 03:34 PM   Modules accepted: Orders

## 2023-01-26 NOTE — Progress Notes (Signed)
   REFERRING PHYSICIAN:  Patrice Paradise, Md [address] [facility] [place],  [place] [address]  DOS: 12/25/22 SCS placement  HISTORY OF PRESENT ILLNESS: Lisa Good is status post spinal cord stimulator placement.   After placement of her device, we had excellent communication between the programmer and her device immediately, but then she was unable to charge the device.   PHYSICAL EXAMINATION:  General: Patient is well developed, well nourished, calm, collected, and in no apparent distress.   NEUROLOGICAL:  General: In no acute distress.   Awake, alert, oriented to person, place, and time.  Pupils equal round and reactive to light.  Facial tone is symmetric.   Strength:            Side Iliopsoas Quads Hamstring PF DF EHL  R L Incisions c/d/I and healing well  I can palpate her battery.  ROS (Neurologic):  Negative except as noted above  IMAGING: Nothing new to review  ASSESSMENT/PLAN:  Lisa Good is doing well after placement of spinal cord stimulator.  Unfortunate, her battery is not discharging.  We have given it some time to allow for reduction in swelling but it is still not charging.  At this point, we must declare this a nonfunctional pulse generator with battery at end-of-life.  I have recommended replacement of her battery.  Venetia Night Department of neurosurgery

## 2023-01-28 ENCOUNTER — Encounter
Admission: RE | Admit: 2023-01-28 | Discharge: 2023-01-28 | Disposition: A | Discharge status: Home / Self Care | Payer: Medicare Other | Source: Ambulatory Visit | Attending: Neurosurgery | Admitting: Neurosurgery

## 2023-01-28 NOTE — Patient Instructions (Addendum)
Pre op phone interview with patient. Chart reviewed. Medical/surgical history, medications, psychosocial, ADL's, and discharge plan reviewed with patient. Verbal and written instructions thru MyChart given. Patient verbalized understanding. Aware to call with any questions/concerns.   Your procedure is scheduled on: Monday 02/01/23 To find out your arrival time, please call [phone] between 1PM - 3PM on:   Friday 01/29/23 Report to the Registration Desk on the 1st floor of the Medical Mall. Valet parking is available.  If your arrival time is 6:00 am, do not arrive before that time as the Medical Mall entrance doors do not open until 6:00 am.  REMEMBER: Instructions that are not followed completely may result in serious medical risk, up to and including death; or upon the discretion of your surgeon and anesthesiologist your surgery may need to be rescheduled.  Do not eat food after midnight the night before surgery.  No gum chewing or hard candies.  You may however, drink CLEAR liquids up to 2 hours before you are scheduled to arrive for your surgery. Do not drink anything within 2 hours of your scheduled arrival time.  Clear liquids include: - water  - apple juice without pulp - gatorade (not RED colors) - black coffee or tea (Do NOT add milk or creamers to the coffee or tea) Do NOT drink anything that is not on this list.  Type 1 and Type 2 diabetics should only drink water.  One week prior to surgery: Stop Anti-inflammatories (NSAIDS) such as Advil, Aleve, Ibuprofen, Motrin, Naproxen, Naprosyn and Aspirin based products such as Excedrin, Goody's Powder, BC Powder. You may however, continue to take Tylenol if needed for pain up until the day of surgery.  Stop ANY OVER THE COUNTER supplements or vitamins until after surgery.  Continue taking all prescribed medications.   TAKE ONLY THESE MEDICATIONS THE MORNING OF SURGERY WITH A SIP OF WATER:  atorvastatin (LIPITOR) 40 MG  tablet  gabapentin (NEURONTIN) 800 MG tablet  methocarbamol (ROBAXIN) 500 MG tablet  oxyCODONE-acetaminophen (PERCOCET) 10-325 MG tablet  pantoprazole (PROTONIX) 40 MG tablet Antacid (take one the night before and one on the morning of surgery - helps to prevent nausea after surgery.)  No Alcohol for 24 hours before or after surgery.  No Smoking including e-cigarettes for 24 hours before surgery.  No chewable tobacco products for at least 6 hours before surgery.  No nicotine patches on the day of surgery.  Do not use any "recreational" drugs for at least a week (preferably 2 weeks) before your surgery.  Please be advised that the combination of cocaine and anesthesia may have negative outcomes, up to and including death. If you test positive for cocaine, your surgery will be cancelled.  On the morning of surgery brush your teeth with toothpaste and water, you may rinse your mouth with mouthwash if you wish. Do not swallow any toothpaste or mouthwash.  Use CHG Soap or wipes as directed on instruction sheet. Use for 5 days starting today Thursday 01/28/23  Do not wear lotions, powders, or perfumes the day of your procedure.  Do not shave body hair from the neck down 48 hours before surgery.  Wear comfortable clothing (specific to your surgery type) to the hospital.  Do not wear jewelry, make-up, hairpins, clips or nail polish.  Contact lenses, hearing aids and dentures may not be worn into surgery.  Bring your C-PAP to the hospital in case you may have to spend the night.   Do not bring valuables to the  hospital. [facility] is not responsible for any missing/lost belongings or valuables.   Notify your doctor if there is any change in your medical condition (cold, fever, infection).  If you are being discharged the day of surgery, you will not be allowed to drive home. You will need a responsible individual to drive you home and stay with you for 24 hours after surgery.   If you  are taking public transportation, you will need to have a responsible individual with you.  If you are being admitted to the hospital overnight, leave your suitcase in the car. After surgery it may be brought to your room.  In case of increased patient census, it may be necessary for you, the patient, to continue your postoperative care in the Same Day Surgery department.  After surgery, you can help prevent lung complications by doing breathing exercises.  Take deep breaths and cough every 1-2 hours. Your doctor may order a device called an Incentive Spirometer to help you take deep breaths. When coughing or sneezing, hold a pillow firmly against your incision with both hands. This is called "splinting." Doing this helps protect your incision. It also decreases belly discomfort.  Surgery Visitation Policy:  Patients undergoing a surgery or procedure may have two family members or support persons with them as long as the person is not COVID-19 positive or experiencing its symptoms.   Inpatient Visitation:    Visiting hours are 7 a.m. to 8 p.m. Up to four visitors are allowed at one time in a patient room. The visitors may rotate out with other people during the day. One designated support person (adult) may remain overnight.  Please call the Pre-admissions Testing Dept. at [phone] if you have any questions about these instructions.

## 2023-01-31 MED ORDER — CEFAZOLIN SODIUM-DEXTROSE 2-4 GM/100ML-% IV SOLN
2.0000 g | INTRAVENOUS | Status: AC
  Administered 2023-02-01: 2 g via INTRAVENOUS

## 2023-01-31 MED ORDER — CEFAZOLIN IN SODIUM CHLORIDE 2-0.9 GM/100ML-% IV SOLN
2.0000 g | Freq: Once | INTRAVENOUS | Status: DC
  Filled 2023-01-31: qty 100

## 2023-01-31 MED ORDER — VANCOMYCIN HCL IN DEXTROSE 1-5 GM/200ML-% IV SOLN
1000.0000 mg | Freq: Once | INTRAVENOUS | Status: AC
  Administered 2023-02-01: 1000 mg via INTRAVENOUS

## 2023-01-31 MED ORDER — ORAL CARE MOUTH RINSE: 15.0000 mL | Freq: Once | OROMUCOSAL | Status: AC

## 2023-01-31 MED ORDER — LACTATED RINGERS IV SOLN: INTRAVENOUS | Status: DC

## 2023-01-31 MED ORDER — CHLORHEXIDINE GLUCONATE 0.12 % MT SOLN
15.0000 mL | Freq: Once | OROMUCOSAL | Status: AC
  Administered 2023-02-01: 15 mL via OROMUCOSAL

## 2023-02-01 ENCOUNTER — Ambulatory Visit
Admission: RE | Admit: 2023-02-01 | Discharge: 2023-02-01 | Disposition: A | Discharge status: Home / Self Care | Payer: Medicare Other | Attending: Neurosurgery | Admitting: Neurosurgery

## 2023-02-01 ENCOUNTER — Other Ambulatory Visit: Payer: Self-pay

## 2023-02-01 ENCOUNTER — Encounter: Payer: Self-pay | Admitting: Neurosurgery

## 2023-02-01 ENCOUNTER — Encounter
Admission: RE | Disposition: A | Discharge status: Home / Self Care | Payer: Self-pay | Source: Home / Self Care | Attending: Neurosurgery

## 2023-02-01 ENCOUNTER — Ambulatory Visit: Payer: Medicare Other

## 2023-02-01 ENCOUNTER — Telehealth: Payer: Self-pay | Admitting: *Deleted

## 2023-02-01 DIAGNOSIS — Z79899 Other long term (current) drug therapy: Secondary | ICD-10-CM | POA: Diagnosis not present

## 2023-02-01 DIAGNOSIS — Z01818 Encounter for other preprocedural examination: Secondary | ICD-10-CM

## 2023-02-01 DIAGNOSIS — Z4542 Encounter for adjustment and management of neuropacemaker (brain) (peripheral nerve) (spinal cord): Secondary | ICD-10-CM | POA: Diagnosis present

## 2023-02-01 DIAGNOSIS — I129 Hypertensive chronic kidney disease with stage 1 through stage 4 chronic kidney disease, or unspecified chronic kidney disease: Secondary | ICD-10-CM | POA: Diagnosis not present

## 2023-02-01 DIAGNOSIS — E1122 Type 2 diabetes mellitus with diabetic chronic kidney disease: Secondary | ICD-10-CM | POA: Insufficient documentation

## 2023-02-01 DIAGNOSIS — T85192A Other mechanical complication of implanted electronic neurostimulator (electrode) of spinal cord, initial encounter: Secondary | ICD-10-CM | POA: Diagnosis not present

## 2023-02-01 DIAGNOSIS — K219 Gastro-esophageal reflux disease without esophagitis: Secondary | ICD-10-CM | POA: Diagnosis not present

## 2023-02-01 DIAGNOSIS — E1142 Type 2 diabetes mellitus with diabetic polyneuropathy: Secondary | ICD-10-CM | POA: Insufficient documentation

## 2023-02-01 DIAGNOSIS — Z87891 Personal history of nicotine dependence: Secondary | ICD-10-CM | POA: Diagnosis not present

## 2023-02-01 DIAGNOSIS — G2581 Restless legs syndrome: Secondary | ICD-10-CM | POA: Diagnosis not present

## 2023-02-01 DIAGNOSIS — M797 Fibromyalgia: Secondary | ICD-10-CM | POA: Insufficient documentation

## 2023-02-01 DIAGNOSIS — G894 Chronic pain syndrome: Secondary | ICD-10-CM | POA: Diagnosis not present

## 2023-02-01 DIAGNOSIS — E039 Hypothyroidism, unspecified: Secondary | ICD-10-CM | POA: Insufficient documentation

## 2023-02-01 DIAGNOSIS — N189 Chronic kidney disease, unspecified: Secondary | ICD-10-CM | POA: Diagnosis not present

## 2023-02-01 DIAGNOSIS — E785 Hyperlipidemia, unspecified: Secondary | ICD-10-CM | POA: Insufficient documentation

## 2023-02-01 DIAGNOSIS — F32A Depression, unspecified: Secondary | ICD-10-CM | POA: Insufficient documentation

## 2023-02-01 DIAGNOSIS — G4733 Obstructive sleep apnea (adult) (pediatric): Secondary | ICD-10-CM | POA: Diagnosis not present

## 2023-02-01 HISTORY — PX: SPINAL CORD STIMULATOR BATTERY EXCHANGE: SHX6202

## 2023-02-01 SURGERY — SPINAL CORD STIMULATOR BATTERY EXCHANGE
Anesthesia: General

## 2023-02-01 MED ORDER — MIDAZOLAM HCL 2 MG/2ML IJ SOLN
INTRAMUSCULAR | Status: AC
  Filled 2023-02-01: qty 2

## 2023-02-01 MED ORDER — VANCOMYCIN HCL IN DEXTROSE 1-5 GM/200ML-% IV SOLN
INTRAVENOUS | Status: AC
  Filled 2023-02-01: qty 200

## 2023-02-01 MED ORDER — GLYCOPYRROLATE 0.2 MG/ML IJ SOLN
INTRAMUSCULAR | Status: AC
  Filled 2023-02-01: qty 1

## 2023-02-01 MED ORDER — BUPIVACAINE-EPINEPHRINE (PF) 0.5% -1:200000 IJ SOLN
INTRAMUSCULAR | Status: DC | PRN
  Administered 2023-02-01: 5 mL via PERINEURAL

## 2023-02-01 MED ORDER — PROPOFOL 500 MG/50ML IV EMUL
INTRAVENOUS | Status: DC | PRN
  Administered 2023-02-01: 155 ug/kg/min via INTRAVENOUS

## 2023-02-01 MED ORDER — FENTANYL CITRATE (PF) 100 MCG/2ML IJ SOLN
INTRAMUSCULAR | Status: AC
  Filled 2023-02-01: qty 2

## 2023-02-01 MED ORDER — PROPOFOL 10 MG/ML IV BOLUS
INTRAVENOUS | Status: AC
  Filled 2023-02-01: qty 20

## 2023-02-01 MED ORDER — ONDANSETRON HCL 4 MG/2ML IJ SOLN: 4.0000 mg | Freq: Once | INTRAMUSCULAR | Status: DC | PRN

## 2023-02-01 MED ORDER — ACETAMINOPHEN 10 MG/ML IV SOLN
INTRAVENOUS | Status: AC
  Filled 2023-02-01: qty 100

## 2023-02-01 MED ORDER — ONDANSETRON HCL 4 MG/2ML IJ SOLN
INTRAMUSCULAR | Status: AC
  Filled 2023-02-01: qty 2

## 2023-02-01 MED ORDER — PROPOFOL 1000 MG/100ML IV EMUL
INTRAVENOUS | Status: AC
  Filled 2023-02-01: qty 100

## 2023-02-01 MED ORDER — CHLORHEXIDINE GLUCONATE 0.12 % MT SOLN
OROMUCOSAL | Status: AC
  Filled 2023-02-01: qty 15

## 2023-02-01 MED ORDER — FENTANYL CITRATE (PF) 100 MCG/2ML IJ SOLN
INTRAMUSCULAR | Status: DC | PRN
  Administered 2023-02-01 (×2): 25 ug via INTRAVENOUS

## 2023-02-01 MED ORDER — OXYCODONE HCL 5 MG PO TABS: 5.0000 mg | ORAL_TABLET | Freq: Four times a day (QID) | ORAL | 0 refills | Status: AC | PRN

## 2023-02-01 MED ORDER — PROPOFOL 10 MG/ML IV BOLUS
INTRAVENOUS | Status: DC | PRN
  Administered 2023-02-01: 50 mg via INTRAVENOUS
  Administered 2023-02-01: 20 mg via INTRAVENOUS
  Administered 2023-02-01: 50 mg via INTRAVENOUS

## 2023-02-01 MED ORDER — DEXAMETHASONE SODIUM PHOSPHATE 10 MG/ML IJ SOLN
INTRAMUSCULAR | Status: AC
  Filled 2023-02-01: qty 1

## 2023-02-01 MED ORDER — EPINEPHRINE PF 1 MG/ML IJ SOLN
INTRAMUSCULAR | Status: AC
  Filled 2023-02-01: qty 1

## 2023-02-01 MED ORDER — FENTANYL CITRATE (PF) 100 MCG/2ML IJ SOLN: 25.0000 ug | INTRAMUSCULAR | Status: DC | PRN

## 2023-02-01 MED ORDER — MIDAZOLAM HCL 2 MG/2ML IJ SOLN
INTRAMUSCULAR | Status: DC | PRN
  Administered 2023-02-01: 2 mg via INTRAVENOUS

## 2023-02-01 MED ORDER — DEXAMETHASONE SODIUM PHOSPHATE 10 MG/ML IJ SOLN
INTRAMUSCULAR | Status: DC | PRN
  Administered 2023-02-01: 10 mg via INTRAVENOUS

## 2023-02-01 MED ORDER — ACETAMINOPHEN 10 MG/ML IV SOLN
INTRAVENOUS | Status: DC | PRN
  Administered 2023-02-01: 1000 mg via INTRAVENOUS

## 2023-02-01 MED ORDER — DEXMEDETOMIDINE HCL IN NACL 200 MCG/50ML IV SOLN
INTRAVENOUS | Status: DC | PRN
  Administered 2023-02-01: 8 ug via INTRAVENOUS

## 2023-02-01 MED ORDER — ONDANSETRON HCL 4 MG/2ML IJ SOLN
INTRAMUSCULAR | Status: DC | PRN
  Administered 2023-02-01 (×2): 4 mg via INTRAVENOUS

## 2023-02-01 MED ORDER — BUPIVACAINE HCL (PF) 0.5 % IJ SOLN
INTRAMUSCULAR | Status: AC
  Filled 2023-02-01: qty 30

## 2023-02-01 MED ORDER — GLYCOPYRROLATE 0.2 MG/ML IJ SOLN
INTRAMUSCULAR | Status: DC | PRN
  Administered 2023-02-01: .2 mg via INTRAVENOUS

## 2023-02-01 SURGICAL SUPPLY — 38 items
ADH SKN CLS APL DERMABOND .7 (GAUZE/BANDAGES/DRESSINGS) ×1
AGENT HMST KT MTR STRL THRMB (HEMOSTASIS) ×1
BASIN KIT SINGLE STR (MISCELLANEOUS) ×1 IMPLANT
DERMABOND ADVANCED .7 DNX12 (GAUZE/BANDAGES/DRESSINGS) ×1 IMPLANT
DRAPE LAPAROTOMY 100X77 ABD (DRAPES) ×1 IMPLANT
DRAPE SURG 17X11 SM STRL (DRAPES) ×1 IMPLANT
ELECT CAUTERY BLADE TIP 2.5 (TIP) ×1
ELECT REM PT RETURN 9FT ADLT (ELECTROSURGICAL) ×2
ELECTRODE CAUTERY BLDE TIP 2.5 (TIP) ×1 IMPLANT
ELECTRODE REM PT RTRN 9FT ADLT (ELECTROSURGICAL) ×1 IMPLANT
GLOVE BIOGEL PI IND STRL 6.5 (GLOVE) ×1 IMPLANT
GLOVE SURG SYN 6.5 ES PF (GLOVE) ×1 IMPLANT
GLOVE SURG SYN 6.5 PF PI (GLOVE) ×1 IMPLANT
GLOVE SURG SYN 8.5  E (GLOVE) ×1
GLOVE SURG SYN 8.5 E (GLOVE) ×1 IMPLANT
GLOVE SURG SYN 8.5 PF PI (GLOVE) ×3 IMPLANT
GOWN SRG LRG LVL 4 IMPRV REINF (GOWNS) ×1 IMPLANT
GOWN SRG XL LVL 3 NONREINFORCE (GOWNS) ×1 IMPLANT
GOWN STRL NON-REIN TWL XL LVL3 (GOWNS) ×1
GOWN STRL REIN LRG LVL4 (GOWNS) ×1
KIT IPG ALPHA WAVEWRITER (Stimulator) IMPLANT
KIT SPINAL PRONEVIEW (KITS) ×1 IMPLANT
MANIFOLD NEPTUNE II (INSTRUMENTS) ×1 IMPLANT
MARKER SKIN DUAL TIP RULER LAB (MISCELLANEOUS) ×1 IMPLANT
NDL SAFETY ECLIP 18X1.5 (MISCELLANEOUS) ×2 IMPLANT
NS IRRIG 1000ML POUR BTL (IV SOLUTION) ×1 IMPLANT
PACK LAMINECTOMY NEURO (CUSTOM PROCEDURE TRAY) ×1 IMPLANT
PAD ARMBOARD 7.5X6 YLW CONV (MISCELLANEOUS) ×1 IMPLANT
SOLUTION IRRIG SURGIPHOR (IV SOLUTION) ×1 IMPLANT
SURGIFLO W/THROMBIN 8M KIT (HEMOSTASIS) IMPLANT
SUT DVC VLOC 3-0 CL 6 P-12 (SUTURE) ×1 IMPLANT
SUT SILK 2 0SH CR/8 30 (SUTURE) IMPLANT
SUT VIC AB 0 CT1 27 (SUTURE) ×1
SUT VIC AB 0 CT1 27XCR 8 STRN (SUTURE) ×1 IMPLANT
SUT VIC AB 2-0 CT1 18 (SUTURE) ×1 IMPLANT
SYR 30ML LL (SYRINGE) ×1 IMPLANT
TOWEL OR 17X26 4PK STRL BLUE (TOWEL DISPOSABLE) ×2 IMPLANT
TRAP FLUID SMOKE EVACUATOR (MISCELLANEOUS) ×1 IMPLANT

## 2023-02-01 NOTE — Telephone Encounter (Signed)
Patient called in today and states she a had produce done today by another Doctor and states she can't urinate . She has not drink anything all day . She feel like she can go but can't. No pain in her belly . I told  patient she may need to go to the er if she can't urinate. Than patient states she has had some urine come out when she gets out of the bed. Advised her to call doctor that did the surgery on her also.  Patient got upset and said never mind and hang the phone up.

## 2023-02-01 NOTE — Progress Notes (Addendum)
error 

## 2023-02-01 NOTE — Discharge Summary (Signed)
Discharge Summary  Patient ID: Lisa Good MRN: [id number] DOB/AGE: Jan 28, 1951 72 y.o.  Admit date: 02/01/2023 Discharge date: 02/01/2023  Admission Diagnoses:  Z45.42 battery end of life, T85.192S Malfunction of spinal cord stimulator, sequela .  Discharge Diagnoses:  Active Problems:   Battery end of life of spinal cord stimulator   Malfunction of spinal cord stimulator [facility])   Discharged Condition: good  Hospital Course:  Lisa Good is a 72 y.o presenting for malfunctioned SCS battery. Her intraoperative course was uncomplicated. She was monitored post-op and discharged home after ambulating, urinating, and tolerating PO intake. She was given a 3 day prescription of Oxycodone to use for breakthrough pain  Consults: None  Significant Diagnostic Studies: none  Treatments: surgery: as above. Please see separately dictated operative report for further details  Discharge Exam: Blood pressure (!) 158/75, pulse 90, temperature (!) 97 F (36.1 C), temperature source Temporal, resp. rate 20, height 5\' 4"  (1.626 m), weight 88.5 kg, SpO2 96 %. CN II-XII grossly intact MAEW Incision covered with clean psot-op bandage  Disposition: Discharge disposition: 01-Home or Self Care       Discharge Instructions     Diet - low sodium heart healthy   Complete by: As directed       Allergies as of 02/01/2023       Reactions   Contrast Media [iodinated Contrast Media] Hives   Iodine Hives   Duloxetine Swelling   Legs and feet   Pregabalin Swelling   Legs and feet   Clarithromycin Nausea Only, Other (See Comments)   Spironolactone-hctz Rash   Only with iv injection        Medication List     TAKE these medications    Alpha-Lipoic Acid 600 MG Caps Take 600 mg by mouth 2 (two) times daily.   amLODipine 10 MG tablet Commonly known as: NORVASC Take 10 mg by mouth at bedtime.   atorvastatin 40 MG tablet Commonly known as: LIPITOR Take 40 mg by mouth every morning.    gabapentin 800 MG tablet Commonly known as: NEURONTIN Take 1 tablet (800 mg total) by mouth 4 (four) times daily. What changed:  when to take this additional instructions   ibuprofen 200 MG tablet Commonly known as: ADVIL Take 400-600 mg by mouth as needed for moderate pain.   ipratropium 0.06 % nasal spray Commonly known as: ATROVENT Place 2 sprays into both nostrils 3 (three) times daily. As needed for nasal congestion, runny nose   lisinopril 40 MG tablet Commonly known as: ZESTRIL Take 40 mg by mouth at bedtime.   methocarbamol 500 MG tablet Commonly known as: ROBAXIN Take 1 tablet (500 mg total) by mouth 4 (four) times daily.   oxyCODONE 5 MG immediate release tablet Commonly known as: Roxicodone Take 1 tablet (5 mg total) by mouth every 6 (six) hours as needed for up to 3 days for breakthrough pain. What changed:  when to take this reasons to take this   oxyCODONE-acetaminophen 10-325 MG tablet Commonly known as: PERCOCET Take 1 tablet by mouth 4 (four) times daily.   pantoprazole 40 MG tablet Commonly known as: PROTONIX Take 40 mg by mouth every morning.   traZODone 150 MG tablet Commonly known as: DESYREL Take 1 tablet (150 mg total) by mouth at bedtime.   trimethoprim 100 MG tablet Commonly known as: TRIMPEX Take 1 tablet (100 mg total) by mouth daily.   venlafaxine XR 150 MG 24 hr capsule Commonly known as: EFFEXOR-XR Take 150 mg by mouth  at bedtime. Take with 75 mg to equal 225 mg daily   venlafaxine XR 75 MG 24 hr capsule Commonly known as: EFFEXOR-XR Take 75 mg by mouth at bedtime. Take with 150 mg to equal 225 mg daily   XYLITOL MT Use as directed 2 tablets in the mouth or throat daily as needed (dry mouth).        Follow-up Information     Drake Leach, [place]. Go on 02/17/2023.   Specialty: Neurosurgery Why: at 11:30 am, Post operative follow up, As Scheduled Contact information: [address] [place]  [address] [phone]                 Signed: Susanne Borders 02/01/2023, 2:35 PM

## 2023-02-01 NOTE — Anesthesia Procedure Notes (Signed)
Procedure Name: MAC Date/Time: 02/01/2023 9:56 AM  Performed by: Mohammed Kindle, CRNAPre-anesthesia Checklist: Patient identified, Emergency Drugs available, Suction available and Patient being monitored Patient Re-evaluated:Patient Re-evaluated prior to induction Oxygen Delivery Method: Nasal cannula Induction Type: IV induction Placement Confirmation: positive ETCO2 Dental Injury: Teeth and Oropharynx as per pre-operative assessment

## 2023-02-01 NOTE — H&P (Signed)
Referring Physician:  No referring provider defined for this encounter.  Primary Physician:  Patrice Paradise, MD  History of Present Illness: 02/01/2023 Lisa Good presents today for a planned reexploration of her malfunctioning pulse generator.  12/01/2022 Lisa Good is here today with a chief complaint of chronic pain.  She had a spinal cord stimulator trial with [employer] where she had greater than 50% relief of her neuropathic foot pain.  She has also been cleared by Psychology.   Past Surgery: L2-S1 fusion in August 2017  07/10/2019: C4-5 corpectomy with C6-7 ACDF, C2-T3 spinal fusion  Thoracolumbar fusion in 2021   [name] has no symptoms of cervical myelopathy.  The symptoms are causing a significant impact on the patient's life.   I have utilized the care everywhere function in epic to review the outside records available from external health systems.  Review of Systems:  A 10 point review of systems is negative, except for the pertinent positives and negatives detailed in the HPI.  Past Medical History: Past Medical History:  Diagnosis Date   Allergy    seasonal   Arthritis    Cataract    Cerebral aneurysm    Chronic kidney disease    Chronic pain syndrome    Complication of anesthesia    trouble waking up after back surgery   DDD (degenerative disc disease), lumbar    Depression    DVT (deep venous thrombosis) (HCC) 2002   Fibromyalgia    GERD (gastroesophageal reflux disease)    Heart murmur    Heart dr said normal for me   History of kidney stones    Hyperlipidemia    Hypertension    Hypothyroidism    Incontinence in female    Iron deficiency anemia 11/01/2019   Irregular heart beat    Neuromuscular disorder (HCC)    peripheral neuropathy/feet   Obesity    OSA on CPAP    Pancolitis (HCC)    Pre-diabetes    RLS (restless legs syndrome)    UTI (urinary tract infection) 11/2022    Past Surgical History: Past  Surgical History:  Procedure Laterality Date   BRAIN SURGERY  2002   aneurysm   CEREBRAL ANEURYSM REPAIR  2002   COLONOSCOPY WITH PROPOFOL N/A 11/10/2019   Procedure: COLONOSCOPY WITH PROPOFOL;  Surgeon: Wyline Mood, MD;  Location[facility];  Service: Endoscopy;  Laterality: N/A;   COSMETIC SURGERY     ESOPHAGOGASTRODUODENOSCOPY (EGD) WITH PROPOFOL N/A 11/10/2019   Procedure: ESOPHAGOGASTRODUODENOSCOPY (EGD) WITH PROPOFOL;  Surgeon: Wyline Mood, MD;  Location: [facility];  Service: Endoscopy;  Laterality: N/A;   EXTRACORPOREAL SHOCK WAVE LITHOTRIPSY Right 04/27/2019   Procedure: EXTRACORPOREAL SHOCK WAVE LITHOTRIPSY (ESWL);  Surgeon: Sondra Come, MD;  Location: [facility] ORS;  Service: Urology;  Laterality: Right;   EYE SURGERY     JOINT REPLACEMENT Bilateral    knee   KNEE ARTHROPLASTY Left 01/12/2020   Procedure: COMPUTER ASSISTED TOTAL KNEE ARTHROPLASTY;  Surgeon: Donato Heinz, MD;  Location: [facility] OR[facility];  Service: Orthopedics;  Laterality: Left;   POLYPECTOMY  11/10/2019   Procedure: POLYPECTOMY;  Surgeon: Wyline Mood, MD;  Location: [facility];  Service: Endoscopy;;   REVERSE SHOULDER ARTHROPLASTY Right 01/06/2022   Procedure: REVERSE SHOULDER ARTHROPLASTY;  Surgeon: Christena Flake, MD;  Location: [facility] OR[facility];  Service: Orthopedics;  Laterality: Right;   SPINAL FUSION  07/10/2019   C4-5 corpectomy with C6-7 ACDF, C2-T3 spinal fusion   SPINE SURGERY  spinal fusion   THORACIC LAMINECTOMY FOR SPINAL CORD STIMULATOR N/A 12/25/2022   Procedure: THORACIC LAMINECTOMY FOR SPINAL CORD STIMULATOR IMPLANTATION ([employer]);  Surgeon: Venetia Night, MD;  Location: [facility] OR[facility];  Service: Neurosurgery;  Laterality: N/A;   TUBAL LIGATION      Allergies: Allergies as of 01/26/2023 - Review Complete 01/26/2023  Allergen Reaction Noted   Contrast media [iodinated contrast media] Hives 11/09/2018   Iodine Hives 03/31/2022   Duloxetine Swelling 12/31/2021    Pregabalin Swelling 12/31/2021   Clarithromycin Nausea Only and Other (See Comments) 03/07/2011   Spironolactone-hctz Rash 12/31/2021    Medications: Current Meds  Medication Sig   Alpha-Lipoic Acid 600 MG CAPS Take 600 mg by mouth 2 (two) times daily.   amLODipine (NORVASC) 10 MG tablet Take 10 mg by mouth at bedtime.   atorvastatin (LIPITOR) 40 MG tablet Take 40 mg by mouth every morning.   gabapentin (NEURONTIN) 800 MG tablet Take 1 tablet (800 mg total) by mouth 4 (four) times daily. (Patient taking differently: Take 800 mg by mouth 3 (three) times daily. 1 in am, 1 at lunch and 2 at bedtime)   ibuprofen (ADVIL) 200 MG tablet Take 400-600 mg by mouth as needed for moderate pain.   lisinopril (PRINIVIL,ZESTRIL) 40 MG tablet Take 40 mg by mouth at bedtime.   oxyCODONE (ROXICODONE) 5 MG immediate release tablet Take 1 tablet (5 mg total) by mouth every 4 (four) hours as needed for up to 7 days for moderate pain.   oxyCODONE-acetaminophen (PERCOCET) 10-325 MG tablet Take 1 tablet by mouth 4 (four) times daily.   pantoprazole (PROTONIX) 40 MG tablet Take 40 mg by mouth every morning.   traZODone (DESYREL) 150 MG tablet Take 1 tablet (150 mg total) by mouth at bedtime.   trimethoprim (TRIMPEX) 100 MG tablet Take 1 tablet (100 mg total) by mouth daily.   venlafaxine XR (EFFEXOR-XR) 150 MG 24 hr capsule Take 150 mg by mouth at bedtime. Take with 75 mg to equal 225 mg daily   venlafaxine XR (EFFEXOR-XR) 75 MG 24 hr capsule Take 75 mg by mouth at bedtime. Take with 150 mg to equal 225 mg daily   XYLITOL MT Use as directed 2 tablets in the mouth or throat daily as needed (dry mouth).    Social History: Social History   Tobacco Use   Smoking status: Former    Packs/day: 1.50    Years: 20.00    Additional pack years: 0.00    Total pack years: 30.00    Types: Cigarettes    Quit date: 10/29/2004    Years since quitting: 18.2    Passive exposure: Past   Smokeless tobacco: Never  Vaping Use    Vaping Use: Never used  Substance Use Topics   Alcohol use: Not Currently    Comment: Rare social drinking   Drug use: Never    Family Medical History: Family History  Problem Relation Age of Onset   Hypertension Mother    Depression Mother    Diabetes Mother    Varicose Veins Mother    Hyperlipidemia Sister    Hypertension Sister    Depression Sister    Varicose Veins Sister    Depression Brother    Arthritis Maternal Grandmother    Diabetes Maternal Grandmother    Sleep apnea Neg Hx     Physical Examination: Vitals:   02/01/23 0815  BP: (!) 164/82  Pulse: (!) 101  Resp: 20  Temp: 97.8 F (36.6 C)  SpO2: 97%   Heart sounds normal no MRG. Chest Clear to Auscultation Bilaterally.  General: Patient is well developed, well nourished, calm, collected, and in no apparent distress. Attention to examination is appropriate.  Neck:   Supple.  Full range of motion.  Respiratory: Patient is breathing without any difficulty.   NEUROLOGICAL:     Awake, alert, oriented to person, place, and time.  Speech is clear and fluent.   Cranial Nerves: Pupils equal round and reactive to light.  Facial tone is symmetric.  Facial sensation is symmetric. Shoulder shrug is symmetric. Tongue protrusion is midline.  There is no pronator drift.  ROM of spine: full.    Strength: Side Biceps Triceps Deltoid Interossei Grip Wrist Ext. Wrist Flex.  R 5 5 5 5 5 5 5   L 5 5 5 5 5 5 5    Side Iliopsoas Quads Hamstring PF DF EHL  R 5 5 5 5 5 5   L 5 5 5 5 5 5    Reflexes are 1+ and symmetric at the biceps, triceps, brachioradialis, patella and achilles.   Hoffman's is absent.   Bilateral upper and lower extremity sensation is intact to light touch.    No evidence of dysmetria noted.  Gait is normal.     Medical Decision Making  Imaging: Thoracic spine MRI scan reviewed.  I have personally reviewed the images and agree with the above interpretation.  Assessment and Plan: Ms. Pellicane  is a pleasant 72 y.o. female with chronic pain syndrome and diabetic foot pain.  Her pulse generator has malfunctioned, prompting removal and replacement.    Faatima Tench K. Myer Haff MD, [facility] Neurosurgery

## 2023-02-01 NOTE — Discharge Instructions (Addendum)
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 Increase protein intake to promote wound healing.  Recommended activity: no lifting, driving, or strenuous exercise for 4 weeks . You should walk multiple times per day  Special Instructions  No straining, no heavy lifting > 10lbs x 4 weeks.  Keep incision area clean and dry. May shower in 2 days. No baths or pools for 6 weeks.  Please remove dressing tomorrow, no need to apply a bandage afterwards  You have no sutures to remove, the skin is closed with adhesive  Please take pain medications as directed. Take a stool softener if on pain medications   Please Report any of the following: Nausea or Vomiting, Temperature is greater than 101.20F (38.1C) degrees, Dizziness, Abdominal Pain, Difficulty Breathing or Shortness of Breath, Inability to Eat, drink Fluids, or Take medications, Bleeding, swelling, or drainage from surgical incision sites, New numbness or weakness, and Bowel or bladder dysfunction to the neurosurgeon on call at [phone]  Additional Follow up appointments Please follow up with Dr Adriana Simas in [place] [facility] as scheduled in 2-3 weeks   Please see below for scheduled appointments:  Future Appointments  Date Time Provider Department Center  02/16/2023 11:30 AM Drake Leach, PA-C [facility] None  02/17/2023  2:30 PM Vaillancourt, Lelon Mast, PA-C [facility] None  04/14/2023  9:00 AM Nicholas Lose, Amy, NP [facility] None  04/21/2023  2:00 PM McElwee, Lauren A, NP [facility]    AMBULATORY SURGERY  DISCHARGE INSTRUCTIONS   The drugs that you were given will stay in your system until tomorrow so for the next 24 hours you should not:  Drive an automobile Make any legal decisions Drink any alcoholic beverage   You may resume  regular meals tomorrow.  Today it is better to start with liquids and gradually work up to solid foods.  You may eat anything you prefer, but it is better to start with liquids, then soup and crackers, and gradually work up to solid foods.   Please notify your doctor immediately if you have any unusual bleeding, trouble breathing, redness and pain at the surgery site, drainage, fever, or pain not relieved by medication.    Additional Instructions:        Please contact your physician with any problems or Same Day Surgery at [phone], Monday through Friday 6 am to 4 pm, or [place] at [facility] number at [phone].

## 2023-02-01 NOTE — Anesthesia Preprocedure Evaluation (Signed)
Anesthesia Evaluation  Patient identified by MRN, date of birth, ID band Patient awake    Reviewed: Allergy & Precautions, NPO status , Patient's Chart, lab work & pertinent test results  History of Anesthesia Complications Negative for: history of anesthetic complications  Airway Mallampati: III   Neck ROM: Full    Dental  (+) Missing, Implants, Caps, Dental Advidsory Given   Pulmonary neg shortness of breath, sleep apnea and Continuous Positive Airway Pressure Ventilation , neg COPD, neg recent URI, former smoker   Pulmonary exam normal breath sounds clear to auscultation       Cardiovascular hypertension, (-) angina (-) CAD, (-) Past MI and (-) Cardiac Stents Normal cardiovascular exam(-) dysrhythmias + Valvular Problems/Murmurs  Rhythm:Regular Rate:Normal  ECG 12/07/22:  Normal sinus rhythm Minimal voltage criteria for LVH, may be normal variant ( R in aVL ) 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)   Neuro/Psych neg Seizures PSYCHIATRIC DISORDERS Anxiety Depression    Hx cerebral aneurysm s/p repair 2002; chronic pain  Neuromuscular disease (fibromyalgia)    GI/Hepatic PUD,GERD  ,,  Endo/Other  neg diabetesHypothyroidism  Prediabetes; obesity  Renal/GU Renal disease (CKD)     Musculoskeletal  (+) Arthritis ,  Fibromyalgia -  Abdominal   Peds  Hematology  (+) Blood dyscrasia, anemia   Anesthesia Other Findings Past Medical History: No date: Allergy     Comment:  seasonal No date: Arthritis No date: Cataract No date: Cerebral aneurysm No date: Chronic kidney disease No date: Chronic pain syndrome No date: Complication of anesthesia     Comment:  trouble waking up after back surgery No date: DDD (degenerative disc disease), lumbar No date: Depression 2002: DVT (deep venous thrombosis) (HCC) No date: Fibromyalgia No date: GERD (gastroesophageal reflux disease) No date: Heart murmur     Comment:   Heart dr said normal for me No date: History of kidney stones No date: Hyperlipidemia No date: Hypertension No date: Hypothyroidism No date: Incontinence in female 11/01/2019: Iron deficiency anemia No date: Irregular heart beat No date: Neuromuscular disorder (HCC)     Comment:  peripheral neuropathy/feet No date: Obesity No date: OSA on CPAP No date: Pancolitis (HCC) No date: Pre-diabetes No date: RLS (restless legs syndrome) 11/2022: UTI (urinary tract infection)   Reproductive/Obstetrics                             Anesthesia Physical Anesthesia Plan  ASA: 3  Anesthesia Plan: General   Post-op Pain Management:    Induction: Intravenous  PONV Risk Score and Plan: 3 and Treatment may vary due to age or medical condition, Propofol infusion and TIVA  Airway Management Planned: Simple Face Mask and Natural Airway  Additional Equipment:   Intra-op Plan:   Post-operative Plan:   Informed Consent: I have reviewed the patients History and Physical, chart, labs and discussed the procedure including the risks, benefits and alternatives for the proposed anesthesia with the patient or authorized representative who has indicated his/her understanding and acceptance.     Dental advisory given  Plan Discussed with: CRNA  Anesthesia Plan Comments: (Patient consented for risks of anesthesia including but not limited to:  - adverse reactions to medications - damage to eyes, teeth, lips or other oral mucosa - nerve damage due to positioning  - sore throat or hoarseness - damage to heart, brain, nerves, lungs, other parts of body or loss of life  Informed patient about role of CRNA in  peri- and intra-operative care.  Patient voiced understanding.)        Anesthesia Quick Evaluation

## 2023-02-01 NOTE — Anesthesia Postprocedure Evaluation (Signed)
Anesthesia Post Note  Patient: Lisa Good  Procedure(s) Performed: INTERNAL PULSE GENERATOR REPLACEMENT ([employer])  Patient location during evaluation: PACU Anesthesia Type: General Level of consciousness: awake and alert Pain management: pain level controlled Vital Signs Assessment: post-procedure vital signs reviewed and stable Respiratory status: spontaneous breathing, nonlabored ventilation, respiratory function stable and patient connected to nasal cannula oxygen Cardiovascular status: blood pressure returned to baseline and stable Postop Assessment: no apparent nausea or vomiting Anesthetic complications: no  No notable events documented.   Last Vitals:  Vitals:   02/01/23 1118 02/01/23 1126  BP:  (!) 158/75  Pulse:  90  Resp:  20  Temp: (!) 36.1 C (!) 36.1 C  SpO2:  96%    Last Pain:  Vitals:   02/01/23 1126  TempSrc: Temporal  PainSc: 0-No pain                 Stephanie Coup

## 2023-02-01 NOTE — Transfer of Care (Signed)
Immediate Anesthesia Transfer of Care Note  Patient: Lisa Good  Procedure(s) Performed: INTERNAL PULSE GENERATOR REPLACEMENT ([employer])  Patient Location: PACU  Anesthesia Type:General  Level of Consciousness: awake, drowsy, and patient cooperative  Airway & Oxygen Therapy: Patient Spontanous Breathing and Patient connected to nasal cannula oxygen  Post-op Assessment: Report given to RN and Post -op Vital signs reviewed and stable  Post vital signs: Reviewed and stable  Last Vitals:  Vitals Value Taken Time  BP 158/75 02/01/23 1126  Temp 36.1 C 02/01/23 1126  Pulse 90 02/01/23 1126  Resp 20 02/01/23 1126  SpO2 96 % 02/01/23 1126    Last Pain:  Vitals:   02/01/23 1126  TempSrc: Temporal  PainSc: 0-No pain      Patients Stated Pain Goal: 2 (02/01/23 0815)  Complications: No notable events documented.

## 2023-02-01 NOTE — Op Note (Signed)
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   She had a spinal cord stimulator placed on December 25, 2022.  That was working at the time of placement, it has since malfunctioned and she is unable to recharge this.  As such, she has requested that we replace her battery.  Please note that this was a planned readmission to the hospital for surgical intervention given the nonfunctional status of her pulse generator.      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     Preoperative Note:    Risks of surgery discussed in clinic.   Operative Note:      The patient was then brought from the preoperative center with intravenous access established.  She was given light sedation.  She was then rotated onto the operative bed.  The battery was palpated.  We tried 1 more time to induce charging.  This was possible in certain positions but not possible in an anatomic position for appropriate charging outside the hospital.  As such, we proceeded with surgical intervention.  The area was prepped and draped in standard fashion.  A timeout was performed.  Preoperative antibiotics were given.  The incision was opened and the pulse generator identified.  It was removed from the pocket and disconnected from the leads.  It was handed off for evaluation by the manufacturer.  A new pulse generator was secured to the leads.  A new pocket was made just superior to the prior pocket.  This was slightly shallower than the prior pocket as well.    The pulse generator was inserted in the pocket and the incision then closed in  layers with 0 and 2-0 Vicryl.  3-0 Monocryl was placed on the skin.  Dermabond was used for final closure.   Patient was then rotated back to the preoperative bed. All counts are correct in this case.   I performed the entire procedure with the assistance of Manning Charity PA as an Designer, television/film set. An assistant was required for this procedure due to the complexity.  The assistant provided assistance in tissue manipulation and suction, and was required for the successful and safe performance of the procedure. I performed the critical portions of the procedure.      Venetia Night MD

## 2023-02-02 ENCOUNTER — Ambulatory Visit (INDEPENDENT_AMBULATORY_CARE_PROVIDER_SITE_OTHER): Payer: Medicare Other | Admitting: Physician Assistant

## 2023-02-02 ENCOUNTER — Encounter: Payer: Self-pay | Admitting: Neurosurgery

## 2023-02-02 ENCOUNTER — Encounter: Payer: Medicare Other | Admitting: Neurosurgery

## 2023-02-02 VITALS — BP 148/70 | HR 92 | Ht 64.0 in | Wt 195.0 lb

## 2023-02-02 DIAGNOSIS — R300 Dysuria: Secondary | ICD-10-CM

## 2023-02-02 LAB — MICROSCOPIC EXAMINATION

## 2023-02-02 LAB — URINALYSIS, COMPLETE
Bilirubin, UA: NEGATIVE
Glucose, UA: NEGATIVE
Ketones, UA: NEGATIVE
Nitrite, UA: NEGATIVE
Protein,UA: NEGATIVE
RBC, UA: NEGATIVE
Specific Gravity, UA: 1.01 (ref 1.005–1.030)
Urobilinogen, Ur: 0.2 mg/dL (ref 0.2–1.0)
pH, UA: 6 (ref 5.0–7.5)

## 2023-02-02 LAB — BLADDER SCAN AMB NON-IMAGING: Scan Result: 541

## 2023-02-02 NOTE — Progress Notes (Signed)
Simple Catheter Placement  Due to urinary retention patient is present today for a foley cath placement.  Patient was cleaned and prepped in a sterile fashion with betadine. A 16 FR foley catheter was inserted, urine return was noted  520 ml, urine was yellow in color.  The balloon was filled with 10cc of sterile water.  A leg bag was attached for drainage. Patient was given instruction on proper catheter care.  Patient tolerated well, no complications were noted   Performed by: Wynona Dove, RN  Additional notes/ Follow up: 1 week follow up

## 2023-02-02 NOTE — Progress Notes (Signed)
02/02/2023 5:27 PM   [name] [birth year] [id number]  CC: Chief Complaint  Patient presents with   Dysuria   HPI: Lisa Good is a 72 y.o. female with PMH OAB wet who underwent intravesical Botox with Dr. Sherron Monday 15 days ago and recurrent UTI on suppressive trimethoprim who underwent spinal stimulator pulse generator replacement with Dr. Myer Haff under anesthesia yesterday who presents today for evaluation of dysuria, urgency, and frequency.   Today she reports she initially felt she was having difficulty voiding following intravesical Botox, however this acutely worsened yesterday after her surgical procedure.  She reports abdominal pain and dysuria and does not feel she is emptying her bladder well.  In-office UA today positive for trace leukocytes; urine microscopy with 6-10 WBCs/HPF. PVR .  PMH: Past Medical History:  Diagnosis Date   Allergy    seasonal   Arthritis    Cataract    Cerebral aneurysm    Chronic kidney disease    Chronic pain syndrome    Complication of anesthesia    trouble waking up after back surgery   DDD (degenerative disc disease), lumbar    Depression    DVT (deep venous thrombosis) (HCC) 2002   Fibromyalgia    GERD (gastroesophageal reflux disease)    Heart murmur    Heart dr said normal for me   History of kidney stones    Hyperlipidemia    Hypertension    Hypothyroidism    Incontinence in female    Iron deficiency anemia 11/01/2019   Irregular heart beat    Neuromuscular disorder (HCC)    peripheral neuropathy/feet   Obesity    OSA on CPAP    Pancolitis (HCC)    Pre-diabetes    RLS (restless legs syndrome)    UTI (urinary tract infection) 11/2022    Surgical History: Past Surgical History:  Procedure Laterality Date   BRAIN SURGERY  2002   aneurysm   CEREBRAL ANEURYSM REPAIR  2002   COLONOSCOPY WITH PROPOFOL N/A 11/10/2019   Procedure: COLONOSCOPY WITH PROPOFOL;  Surgeon: Wyline Mood, MD;  Location: [facility]  SURGERY [facility];  Service: Endoscopy;  Laterality: N/A;   COSMETIC SURGERY     ESOPHAGOGASTRODUODENOSCOPY (EGD) WITH PROPOFOL N/A 11/10/2019   Procedure: ESOPHAGOGASTRODUODENOSCOPY (EGD) WITH PROPOFOL;  Surgeon: Wyline Mood, MD;  Location: [facility];  Service: Endoscopy;  Laterality: N/A;   EXTRACORPOREAL SHOCK WAVE LITHOTRIPSY Right 04/27/2019   Procedure: EXTRACORPOREAL SHOCK WAVE LITHOTRIPSY (ESWL);  Surgeon: Sondra Come, MD;  Location: [facility] ORS;  Service: Urology;  Laterality: Right;   EYE SURGERY     JOINT REPLACEMENT Bilateral    knee   KNEE ARTHROPLASTY Left 01/12/2020   Procedure: COMPUTER ASSISTED TOTAL KNEE ARTHROPLASTY;  Surgeon: Donato Heinz, MD;  Location: [facility] ORS;  Service: Orthopedics;  Laterality: Left;   POLYPECTOMY  11/10/2019   Procedure: POLYPECTOMY;  Surgeon: Wyline Mood, MD;  Location: [facility];  Service: Endoscopy;;   REVERSE SHOULDER ARTHROPLASTY Right 01/06/2022   Procedure: REVERSE SHOULDER ARTHROPLASTY;  Surgeon: Christena Flake, MD;  Location: [facility] ORS;  Service: Orthopedics;  Laterality: Right;   SPINAL CORD STIMULATOR BATTERY EXCHANGE N/A 02/01/2023   Procedure: INTERNAL PULSE GENERATOR REPLACEMENT ([employer]);  Surgeon: Venetia Night, MD;  Location: [facility] ORS;  Service: Neurosurgery;  Laterality: N/A;  LOCAL WITH MAC   SPINAL FUSION  07/10/2019   C4-5 corpectomy with C6-7 ACDF, C2-T3 spinal fusion   SPINE SURGERY     spinal fusion   THORACIC LAMINECTOMY FOR  SPINAL CORD STIMULATOR N/A 12/25/2022   Procedure: THORACIC LAMINECTOMY FOR SPINAL CORD STIMULATOR IMPLANTATION ([employer]);  Surgeon: Venetia Night, MD;  Location: [facility] ORS;  Service: Neurosurgery;  Laterality: N/A;   TUBAL LIGATION      Home Medications:  Allergies as of 02/02/2023       Reactions   Contrast Media [iodinated Contrast Media] Hives   Iodine Hives   Duloxetine Swelling   Legs and feet   Pregabalin Swelling   Legs and feet    Clarithromycin Nausea Only, Other (See Comments)   Spironolactone-hctz Rash   Only with iv injection        Medication List        Accurate as of February 02, 2023  5:27 PM. If you have any questions, ask your nurse or doctor.          Alpha-Lipoic Acid 600 MG Caps Take 600 mg by mouth 2 (two) times daily.   amLODipine 10 MG tablet Commonly known as: NORVASC Take 10 mg by mouth at bedtime.   atorvastatin 40 MG tablet Commonly known as: LIPITOR Take 40 mg by mouth every morning.   gabapentin 800 MG tablet Commonly known as: NEURONTIN Take 1 tablet (800 mg total) by mouth 4 (four) times daily. What changed:  when to take this additional instructions   ibuprofen 200 MG tablet Commonly known as: ADVIL Take 400-600 mg by mouth as needed for moderate pain.   ipratropium 0.06 % nasal spray Commonly known as: ATROVENT Place 2 sprays into both nostrils 3 (three) times daily. As needed for nasal congestion, runny nose   lisinopril 40 MG tablet Commonly known as: ZESTRIL Take 40 mg by mouth at bedtime.   methocarbamol 500 MG tablet Commonly known as: ROBAXIN Take 1 tablet (500 mg total) by mouth 4 (four) times daily.   oxyCODONE 5 MG immediate release tablet Commonly known as: Roxicodone Take 1 tablet (5 mg total) by mouth every 6 (six) hours as needed for up to 3 days for breakthrough pain.   oxyCODONE-acetaminophen 10-325 MG tablet Commonly known as: PERCOCET Take 1 tablet by mouth 4 (four) times daily.   pantoprazole 40 MG tablet Commonly known as: PROTONIX Take 40 mg by mouth every morning.   traZODone 150 MG tablet Commonly known as: DESYREL Take 1 tablet (150 mg total) by mouth at bedtime.   trimethoprim 100 MG tablet Commonly known as: TRIMPEX Take 1 tablet (100 mg total) by mouth daily.   venlafaxine XR 150 MG 24 hr capsule Commonly known as: EFFEXOR-XR Take 150 mg by mouth at bedtime. Take with 75 mg to equal 225 mg daily   venlafaxine XR 75 MG 24  hr capsule Commonly known as: EFFEXOR-XR Take 75 mg by mouth at bedtime. Take with 150 mg to equal 225 mg daily   XYLITOL MT Use as directed 2 tablets in the mouth or throat daily as needed (dry mouth).        Allergies:  Allergies  Allergen Reactions   Contrast Media [Iodinated Contrast Media] Hives   Iodine Hives   Duloxetine Swelling    Legs and feet   Pregabalin Swelling    Legs and feet   Clarithromycin Nausea Only and Other (See Comments)   Spironolactone-Hctz Rash    Only with iv injection    Family History: Family History  Problem Relation Age of Onset   Hypertension Mother    Depression Mother    Diabetes Mother    Varicose Veins Mother  Hyperlipidemia Sister    Hypertension Sister    Depression Sister    Varicose Veins Sister    Depression Brother    Arthritis Maternal Grandmother    Diabetes Maternal Grandmother    Sleep apnea Neg Hx     Social History:   reports that she quit smoking about 18 years ago. Her smoking use included cigarettes. She has a 30.00 pack-year smoking history. She has been exposed to tobacco smoke. She has never used smokeless tobacco. She reports that she does not currently use alcohol. She reports that she does not use drugs.  Physical Exam: BP (!) 148/70   Pulse 92   Ht 5\' 4"  (1.626 m)   Wt 195 lb (88.5 kg)   BMI 33.47 kg/m   Constitutional:  Alert and oriented, no acute distress, nontoxic appearing HEENT: [place], AT Cardiovascular: No clubbing, cyanosis, or edema Respiratory: Normal respiratory effort, no increased work of breathing Skin: No rashes, bruises or suspicious lesions Neurologic: Grossly intact, no focal deficits, moving all 4 extremities Psychiatric: Normal mood and affect  Laboratory Data: Results for orders placed or performed in visit on 02/02/23  Microscopic Examination   Urine  Result Value Ref Range   WBC, UA 6-10 (A) 0 - 5 /hpf   RBC, Urine 0-2 0 - 2 /hpf   Epithelial Cells (non renal) 0-10 0 -  10 /hpf   Bacteria, UA Few None seen/Few  Urinalysis, Complete  Result Value Ref Range   Specific Gravity, UA 1.010 1.005 - 1.030   pH, UA 6.0 5.0 - 7.5   Color, UA Yellow Yellow   Appearance Ur Clear Clear   Leukocytes,UA Trace (A) Negative   Protein,UA Negative Negative/Trace   Glucose, UA Negative Negative   Ketones, UA Negative Negative   RBC, UA Negative Negative   Bilirubin, UA Negative Negative   Urobilinogen, Ur 0.2 0.2 - 1.0 mg/dL   Nitrite, UA Negative Negative   Microscopic Examination See below:   BLADDER SCAN AMB NON-IMAGING  Result Value Ref Range   Scan Result 541    Assessment & Plan:   1. Dysuria Today is rather bland, mild pyuria likely secondary to bladder distention.  We discussed that she is in urinary retention today, likely multifactorial in the setting of recent general anesthesia and intravesical Botox.  I offered her Foley catheter placement and she accepted, see separate procedure note for details.  Will plan for voiding trial in 1 week.  If she has an element of incomplete bladder emptying that is symptomatic due to Botox, we will plan to instruct her on CIC at that point. - Urinalysis, Complete - BLADDER SCAN AMB NON-IMAGING   Return in about 1 week (around 02/09/2023) for Voiding trial.  Carman Ching, PA-C  [facility] [address], [address] [place], [place] [address] [phone]

## 2023-02-06 ENCOUNTER — Encounter: Payer: Self-pay | Admitting: Neurosurgery

## 2023-02-08 ENCOUNTER — Telehealth: Payer: Self-pay | Admitting: Neurosurgery

## 2023-02-08 NOTE — Telephone Encounter (Signed)
Spoke to Ms. Schaff.  We will move up her follow up so that we can get her settings changed.

## 2023-02-08 NOTE — Progress Notes (Unsigned)
   REFERRING PHYSICIAN:  Patrice Paradise, Md [address] [facility] [place],  [place] [address]  DOS: 02/01/23 placement on new pulse generator for SCS  HISTORY OF PRESENT ILLNESS: Lisa Good is approximately 1 week status post placement of new pulse generator for SCS. Was given  a few days of oxycodone on discharge from the hospital. She takes chronic oxycodone 10 four times a day.   She had urinary retention postop and saw urology. They sent her home with a foley. Saw urology today to have foley removed and sees them later today for recheck.   Rep is here to meet with her today to increase the settings on her SCS.   She has mid to lower back pain with pain in both feet. Pain was severe on Saturday. She is on her chronic percocet 10. She only has a few of the oxycodone 5mg  left.    PHYSICAL EXAMINATION:  General: Patient is well developed, well nourished, calm, collected, and in no apparent distress.   NEUROLOGICAL:  General: In no acute distress.   Awake, alert, oriented to person, place, and time.  Pupils equal round and reactive to light.  Facial tone is symmetric.     Strength:            Side Iliopsoas Quads Hamstring PF DF EHL  R 5 5 5 5 5 5   L 5 5 5 5 5 5    Incisions are c/d/i   ROS (Neurologic):  Negative except as noted above  IMAGING: Nothing new to review.   ASSESSMENT/PLAN:  Lisa Good is doing reasonable s/p above surgery. Treatment options reviewed with patient and following plan made:   - I have advised the patient to lift up to 10 pounds until 6 weeks after surgery (follow up with Dr. Myer Haff).  - Reviewed wound care.  - No bending, twisting, or lifting.  - Continue on current medications including chronic percocet 10. She was given one additional refill of oxycodone 5mg  to take for break thru pain. PMP reviewed and is appropriate.  - She wanted to cancel her visit with me next week. Follow up with Dr. Myer Haff in 4 weeks and  prn.   Advised to contact the office if any questions or concerns arise.  Drake Leach PA-C Department of neurosurgery

## 2023-02-08 NOTE — Telephone Encounter (Signed)
Patient is calling that she is having severe pain more in her feet, but in her back also. She wants to be seen ASAP. The Rep told her that the simulator is in low power right now. She feels the pain is worse than before the surgery. She is taking the oxycodone as directed. She is suffering a lot. Can someone please call her.

## 2023-02-08 NOTE — Telephone Encounter (Signed)
The [employer] are not available today. However, they are available tomorrow morning at 10am. Lisa Good agreed to double book her schedule. Lisa Good agreed to come tomorrow at 10am.

## 2023-02-08 NOTE — Telephone Encounter (Signed)
Dr Myer Haff spoke with the patient. I have sent a message to the reps to see if they are available this afternoon if we bring her in to clinic. Waiting on response.

## 2023-02-09 ENCOUNTER — Ambulatory Visit (INDEPENDENT_AMBULATORY_CARE_PROVIDER_SITE_OTHER): Payer: Medicare Other | Admitting: Physician Assistant

## 2023-02-09 ENCOUNTER — Ambulatory Visit (INDEPENDENT_AMBULATORY_CARE_PROVIDER_SITE_OTHER): Payer: Medicare Other | Admitting: Orthopedic Surgery

## 2023-02-09 ENCOUNTER — Encounter: Payer: Self-pay | Admitting: Orthopedic Surgery

## 2023-02-09 ENCOUNTER — Encounter: Payer: Medicare Other | Admitting: Neurosurgery

## 2023-02-09 ENCOUNTER — Ambulatory Visit: Payer: Medicare Other | Admitting: Physician Assistant

## 2023-02-09 VITALS — BP 125/79 | HR 96 | Temp 98.5°F

## 2023-02-09 DIAGNOSIS — R339 Retention of urine, unspecified: Secondary | ICD-10-CM | POA: Diagnosis not present

## 2023-02-09 DIAGNOSIS — Z9689 Presence of other specified functional implants: Secondary | ICD-10-CM

## 2023-02-09 DIAGNOSIS — T85192S Other mechanical complication of implanted electronic neurostimulator (electrode) of spinal cord, sequela: Secondary | ICD-10-CM

## 2023-02-09 DIAGNOSIS — N3946 Mixed incontinence: Secondary | ICD-10-CM

## 2023-02-09 DIAGNOSIS — T85192D Other mechanical complication of implanted electronic neurostimulator (electrode) of spinal cord, subsequent encounter: Secondary | ICD-10-CM

## 2023-02-09 DIAGNOSIS — Z09 Encounter for follow-up examination after completed treatment for conditions other than malignant neoplasm: Secondary | ICD-10-CM

## 2023-02-09 LAB — BLADDER SCAN AMB NON-IMAGING: Scan Result: 177

## 2023-02-09 MED ORDER — OXYCODONE HCL 5 MG PO TABS
5.0000 mg | ORAL_TABLET | Freq: Four times a day (QID) | ORAL | 0 refills | Status: DC | PRN
Start: 2023-02-09 — End: 2023-02-21

## 2023-02-09 NOTE — Progress Notes (Signed)
02/09/2023 5:37 PM   Lisa Good 1951/05/12 [id number]  CC: Chief Complaint  Patient presents with   Urinary Retention   HPI: Lisa Good is a 72 y.o. female with a recent history of urinary retention in the setting of intravesical Botox followed by spinal stimulator pulse generator replacement under anesthesia 14 days later who presents today for voiding trial.  Foley catheter removed in the morning, see separate procedure note for details.  She returned to clinic in the afternoon.  She had not voided all day, however she was able to go upon arrival.  PVR 177 mL.  She has no acute concerns.  PMH: Past Medical History:  Diagnosis Date   Allergy    seasonal   Arthritis    Cataract    Cerebral aneurysm    Chronic kidney disease    Chronic pain syndrome    Complication of anesthesia    trouble waking up after back surgery   DDD (degenerative disc disease), lumbar    Depression    DVT (deep venous thrombosis) (HCC) 2002   Fibromyalgia    GERD (gastroesophageal reflux disease)    Heart murmur    Heart dr said normal for me   History of kidney stones    Hyperlipidemia    Hypertension    Hypothyroidism    Incontinence in female    Iron deficiency anemia 11/01/2019   Irregular heart beat    Neuromuscular disorder (HCC)    peripheral neuropathy/feet   Obesity    OSA on CPAP    Pancolitis (HCC)    Pre-diabetes    RLS (restless legs syndrome)    UTI (urinary tract infection) 11/2022    Surgical History: Past Surgical History:  Procedure Laterality Date   BRAIN SURGERY  2002   aneurysm   CEREBRAL ANEURYSM REPAIR  2002   COLONOSCOPY WITH PROPOFOL N/A 11/10/2019   Procedure: COLONOSCOPY WITH PROPOFOL;  Surgeon: Wyline Mood, MD;  Location: [facility];  Service: Endoscopy;  Laterality: N/A;   COSMETIC SURGERY     ESOPHAGOGASTRODUODENOSCOPY (EGD) WITH PROPOFOL N/A 11/10/2019   Procedure: ESOPHAGOGASTRODUODENOSCOPY (EGD) WITH PROPOFOL;  Surgeon: Wyline Mood,  MD;  Location: [facility];  Service: Endoscopy;  Laterality: N/A;   EXTRACORPOREAL SHOCK WAVE LITHOTRIPSY Right 04/27/2019   Procedure: EXTRACORPOREAL SHOCK WAVE LITHOTRIPSY (ESWL);  Surgeon: Sondra Come, MD;  Location: [facility];  Service: Urology;  Laterality: Right;   EYE SURGERY     JOINT REPLACEMENT Bilateral    knee   KNEE ARTHROPLASTY Left 01/12/2020   Procedure: COMPUTER ASSISTED TOTAL KNEE ARTHROPLASTY;  Surgeon: Donato Heinz, MD;  Location: [facility];  Service: Orthopedics;  Laterality: Left;   POLYPECTOMY  11/10/2019   Procedure: POLYPECTOMY;  Surgeon: Wyline Mood, MD;  Location: [facility];  Service: Endoscopy;;   REVERSE SHOULDER ARTHROPLASTY Right 01/06/2022   Procedure: REVERSE SHOULDER ARTHROPLASTY;  Surgeon: Christena Flake, MD;  Location: [facility];  Service: Orthopedics;  Laterality: Right;   SPINAL CORD STIMULATOR BATTERY EXCHANGE N/A 02/01/2023   Procedure: INTERNAL PULSE GENERATOR REPLACEMENT ([employer]);  Surgeon: Venetia Night, MD;  Location: [facility];  Service: Neurosurgery;  Laterality: N/A;  LOCAL WITH MAC   SPINAL FUSION  07/10/2019   C4-5 corpectomy with C6-7 ACDF, C2-T3 spinal fusion   SPINE SURGERY     spinal fusion   THORACIC LAMINECTOMY FOR SPINAL CORD STIMULATOR N/A 12/25/2022   Procedure: THORACIC LAMINECTOMY FOR SPINAL CORD STIMULATOR IMPLANTATION ([employer]);  Surgeon: Venetia Night, MD;  Location: [facility];  Service: Neurosurgery;  Laterality: N/A;   TUBAL LIGATION      Home Medications:  Allergies as of 02/09/2023       Reactions   Contrast Media [iodinated Contrast Media] Hives   Iodine Hives   Duloxetine Swelling   Legs and feet   Pregabalin Swelling   Legs and feet   Clarithromycin Nausea Only, Other (See Comments)   Spironolactone-hctz Rash   Only with iv injection        Medication List        Accurate as of Feb 09, 2023  5:37 PM. If you have any questions, ask your nurse or doctor.           Alpha-Lipoic Acid 600 MG Caps Take 600 mg by mouth 2 (two) times daily.   amLODipine 10 MG tablet Commonly known as: NORVASC Take 10 mg by mouth at bedtime.   atorvastatin 40 MG tablet Commonly known as: LIPITOR Take 40 mg by mouth every morning.   gabapentin 800 MG tablet Commonly known as: NEURONTIN Take 1 tablet (800 mg total) by mouth 4 (four) times daily. What changed:  when to take this additional instructions   ibuprofen 200 MG tablet Commonly known as: ADVIL Take 400-600 mg by mouth as needed for moderate pain.   ipratropium 0.06 % nasal spray Commonly known as: ATROVENT Place 2 sprays into both nostrils 3 (three) times daily. As needed for nasal congestion, runny nose   lisinopril 40 MG tablet Commonly known as: ZESTRIL Take 40 mg by mouth at bedtime.   methocarbamol 500 MG tablet Commonly known as: ROBAXIN Take 1 tablet (500 mg total) by mouth 4 (four) times daily.   oxyCODONE 5 MG immediate release tablet Commonly known as: Oxy IR/ROXICODONE Take 1 tablet (5 mg total) by mouth every 6 (six) hours as needed (severe breakthrough pain). What changed:  when to take this reasons to take this Changed by: Smiley Houseman, PA-C   oxyCODONE-acetaminophen 10-325 MG tablet Commonly known as: PERCOCET Take 1 tablet by mouth 4 (four) times daily.   pantoprazole 40 MG tablet Commonly known as: PROTONIX Take 40 mg by mouth every morning.   traZODone 150 MG tablet Commonly known as: DESYREL Take 1 tablet (150 mg total) by mouth at bedtime.   trimethoprim 100 MG tablet Commonly known as: TRIMPEX Take 1 tablet (100 mg total) by mouth daily.   venlafaxine XR 150 MG 24 hr capsule Commonly known as: EFFEXOR-XR Take 150 mg by mouth at bedtime. Take with 75 mg to equal 225 mg daily   venlafaxine XR 75 MG 24 hr capsule Commonly known as: EFFEXOR-XR Take 75 mg by mouth at bedtime. Take with 150 mg to equal 225 mg daily   XYLITOL MT Use as directed 2  tablets in the mouth or throat daily as needed (dry mouth).        Allergies:  Allergies  Allergen Reactions   Contrast Media [Iodinated Contrast Media] Hives   Iodine Hives   Duloxetine Swelling    Legs and feet   Pregabalin Swelling    Legs and feet   Clarithromycin Nausea Only and Other (See Comments)   Spironolactone-Hctz Rash    Only with iv injection    Family History: Family History  Problem Relation Age of Onset   Hypertension Mother    Depression Mother    Diabetes Mother    Varicose Veins Mother    Hyperlipidemia Sister    Hypertension Sister  Depression Sister    Varicose Veins Sister    Depression Brother    Arthritis Maternal Grandmother    Diabetes Maternal Grandmother    Sleep apnea Neg Hx     Social History:   reports that she quit smoking about 18 years ago. Her smoking use included cigarettes. She has a 30.00 pack-year smoking history. She has been exposed to tobacco smoke. She has never used smokeless tobacco. She reports that she does not currently use alcohol. She reports that she does not use drugs.  Physical Exam: There were no vitals taken for this visit.  Constitutional:  Alert and oriented, no acute distress, nontoxic appearing HEENT: [place], AT Cardiovascular: No clubbing, cyanosis, or edema Respiratory: Normal respiratory effort, no increased work of breathing Skin: No rashes, bruises or suspicious lesions Neurologic: Grossly intact, no focal deficits, moving all 4 extremities Psychiatric: Normal mood and affect  Laboratory Data: Results for orders placed or performed in visit on 02/09/23  Bladder Scan (Post Void Residual) in office  Result Value Ref Range   Scan Result 177    Assessment & Plan:   1. Urinary retention Voiding trial passed.  Suspect an element of incomplete bladder emptying in the setting of recent intravesical Botox.  She was already scheduled to see me 2 weeks from now for Botox follow-up, we will move this  appointment out to 1 month from now for symptom recheck and repeat PVR. - Bladder Scan (Post Void Residual) in office   Return in about 4 weeks (around 03/09/2023) for Symptom recheck with PVR.  Carman Ching, PA-C  [facility] [address], [address] [place], [place] [address] [phone]

## 2023-02-09 NOTE — Progress Notes (Signed)
Catheter Removal  Patient is present today for a catheter removal.  9ml of water was drained from the balloon. A 16FR foley cath was removed from the bladder, no complications were noted. Patient tolerated well.  Performed by: Ples Specter CMA  Follow up/ Additional notes: This afternoon

## 2023-02-15 ENCOUNTER — Telehealth: Payer: Self-pay | Admitting: Urology

## 2023-02-15 ENCOUNTER — Other Ambulatory Visit: Payer: Self-pay | Admitting: Family Medicine

## 2023-02-15 ENCOUNTER — Ambulatory Visit: Payer: Medicare Other | Admitting: Urology

## 2023-02-15 ENCOUNTER — Other Ambulatory Visit: Payer: Self-pay

## 2023-02-15 ENCOUNTER — Encounter: Payer: Self-pay | Admitting: Neurosurgery

## 2023-02-15 DIAGNOSIS — R300 Dysuria: Secondary | ICD-10-CM

## 2023-02-15 NOTE — Telephone Encounter (Signed)
Patient called and LVM that she is currently a patient at [facility] & wanted to transfer here since she lives in [place] now & she stated she has a bad UTI and was wondering if she can come leave a sample. I called patient back and notified her that we do not have a provider in the office today for her to drop off a UA but I would send my manager a message about this.

## 2023-02-16 ENCOUNTER — Encounter: Payer: Medicare Other | Admitting: Orthopedic Surgery

## 2023-02-17 ENCOUNTER — Ambulatory Visit: Payer: Medicare Other | Admitting: Physician Assistant

## 2023-02-19 ENCOUNTER — Encounter: Payer: Self-pay | Admitting: Neurosurgery

## 2023-02-20 ENCOUNTER — Emergency Department (HOSPITAL_BASED_OUTPATIENT_CLINIC_OR_DEPARTMENT_OTHER)
Admission: EM | Admit: 2023-02-20 | Discharge: 2023-02-20 | Disposition: A | Discharge status: Home / Self Care | Payer: Medicare Other | Attending: Emergency Medicine | Admitting: Emergency Medicine

## 2023-02-20 ENCOUNTER — Encounter (HOSPITAL_BASED_OUTPATIENT_CLINIC_OR_DEPARTMENT_OTHER): Payer: Self-pay

## 2023-02-20 ENCOUNTER — Other Ambulatory Visit: Payer: Self-pay

## 2023-02-20 DIAGNOSIS — R300 Dysuria: Secondary | ICD-10-CM | POA: Diagnosis present

## 2023-02-20 LAB — URINALYSIS, W/ REFLEX TO CULTURE (INFECTION SUSPECTED)
Bilirubin Urine: NEGATIVE
Glucose, UA: NEGATIVE mg/dL
Hgb urine dipstick: NEGATIVE
Ketones, ur: NEGATIVE mg/dL
Nitrite: NEGATIVE
Protein, ur: NEGATIVE mg/dL
RBC / HPF: NONE SEEN RBC/hpf (ref 0–5)
Specific Gravity, Urine: 1.015 (ref 1.005–1.030)
pH: 6.5 (ref 5.0–8.0)

## 2023-02-20 LAB — CBC
HCT: 34.2 % — ABNORMAL LOW (ref 36.0–46.0)
Hemoglobin: 10.1 g/dL — ABNORMAL LOW (ref 12.0–15.0)
MCH: 23.6 pg — ABNORMAL LOW (ref 26.0–34.0)
MCHC: 29.5 g/dL — ABNORMAL LOW (ref 30.0–36.0)
MCV: 79.9 fL — ABNORMAL LOW (ref 80.0–100.0)
Platelets: 326 10*3/uL (ref 150–400)
RBC: 4.28 MIL/uL (ref 3.87–5.11)
RDW: 16.4 % — ABNORMAL HIGH (ref 11.5–15.5)
WBC: 6.3 10*3/uL (ref 4.0–10.5)
nRBC: 0 % (ref 0.0–0.2)

## 2023-02-20 LAB — BASIC METABOLIC PANEL
Anion gap: 12 (ref 5–15)
BUN: 13 mg/dL (ref 8–23)
CO2: 26 mmol/L (ref 22–32)
Calcium: 9.3 mg/dL (ref 8.9–10.3)
Chloride: 101 mmol/L (ref 98–111)
Creatinine, Ser: 0.96 mg/dL (ref 0.44–1.00)
GFR, Estimated: >60 mL/min (ref >60)
Glucose, Bld: 114 mg/dL — ABNORMAL HIGH (ref 70–99)
Potassium: 3.8 mmol/L (ref 3.5–5.1)
Sodium: 139 mmol/L (ref 135–145)

## 2023-02-20 MED ORDER — SULFAMETHOXAZOLE-TRIMETHOPRIM 800-160 MG PO TABS
1.0000 | ORAL_TABLET | Freq: Once | ORAL | Status: AC
  Administered 2023-02-20: 1 via ORAL
  Filled 2023-02-20: qty 1

## 2023-02-20 MED ORDER — FLUCONAZOLE 150 MG PO TABS
150.0000 mg | ORAL_TABLET | Freq: Once | ORAL | Status: AC
  Administered 2023-02-20: 150 mg via ORAL
  Filled 2023-02-20: qty 1

## 2023-02-20 MED ORDER — SULFAMETHOXAZOLE-TRIMETHOPRIM 800-160 MG PO TABS: 1.0000 | ORAL_TABLET | Freq: Two times a day (BID) | ORAL | 0 refills | Status: AC

## 2023-02-20 MED ORDER — FLUCONAZOLE 150 MG PO TABS: 150.0000 mg | ORAL_TABLET | Freq: Once | ORAL | 0 refills | Status: AC

## 2023-02-20 NOTE — Discharge Instructions (Addendum)
Contact a health care provider if: °You have a fever. °You develop pain in your back or sides. °You have nausea or vomiting. °You have blood in your urine. °You are not urinating as often as you usually do. °Get help right away if: °Your pain is severe and not relieved with medicines. °You cannot eat or drink without vomiting. °You are confused. °You have a rapid heartbeat while resting. °You have shaking or chills. °You feel extremely weak. °

## 2023-02-20 NOTE — ED Notes (Signed)
Urinated approx.

## 2023-02-20 NOTE — ED Notes (Signed)

## 2023-02-20 NOTE — ED Provider Notes (Signed)
[facility] Provider Note   CSN: [id number] Arrival date & time: 02/20/23  1607     History {Add pertinent medical, surgical, social history, OB history to HPI:1} Chief Complaint  Patient presents with   Dysuria    Lisa Good is a 72 y.o. female who presents to the emergency department with symptoms of UTI.  Patient reports that she has had several urinary tract infections this past year.  I reviewed urine culture reports and previous culture reports show multiple resistant E faecalis infection in January and February of this year and then Citrobacter infection in April with resistance but still oral antibiotic choices.  Patient reports that for the past several days she has had frequency, urgency, burning with urination and thick vaginal discharge.  She had a spinal cord stimulator placed on 02/01/2023 ago and had urinary retention requiring catheterization.  She passed a void trial on 02/09/2023.  She denies flank pain or fevers. She is unsure if she is fully emptying her bladder  Dysuria      Home Medications Prior to Admission medications   Medication Sig Start Date End Date Taking? Authorizing Provider  Alpha-Lipoic Acid 600 MG CAPS Take 600 mg by mouth 2 (two) times daily.    [provider]  amLODipine (NORVASC) 10 MG tablet Take 10 mg by mouth at bedtime.    [provider]  atorvastatin (LIPITOR) 40 MG tablet Take 40 mg by mouth every morning.    [provider]  gabapentin (NEURONTIN) 800 MG tablet Take 1 tablet (800 mg total) by mouth 4 (four) times daily. Patient taking differently: Take 800 mg by mouth 3 (three) times daily. 1 in am, 1 at lunch and 2 at bedtime 01/09/21   Edward Jolly, MD  ibuprofen (ADVIL) 200 MG tablet Take 400-600 mg by mouth as needed for moderate pain.    [provider]  ipratropium (ATROVENT) 0.06 % nasal spray Place 2 sprays into both nostrils 3 (three) times daily.  As needed for nasal congestion, runny nose 10/03/22   Theadora Rama Scales, PA-C  lisinopril (PRINIVIL,ZESTRIL) 40 MG tablet Take 40 mg by mouth at bedtime.    [provider]  methocarbamol (ROBAXIN) 500 MG tablet Take 1 tablet (500 mg total) by mouth 4 (four) times daily. 12/25/22   Susanne Borders, PA  oxyCODONE (OXY IR/ROXICODONE) 5 MG immediate release tablet Take 1 tablet (5 mg total) by mouth every 6 (six) hours as needed (severe breakthrough pain). 02/09/23   Drake Leach, PA-C  oxyCODONE-acetaminophen (PERCOCET) 10-325 MG tablet Take 1 tablet by mouth 4 (four) times daily. 11/30/21   [provider]  pantoprazole (PROTONIX) 40 MG tablet Take 40 mg by mouth every morning. 11/17/21   [provider]  traZODone (DESYREL) 150 MG tablet Take 1 tablet (150 mg total) by mouth at bedtime. 01/20/23   McElwee, Lauren A, NP  trimethoprim (TRIMPEX) 100 MG tablet Take 1 tablet (100 mg total) by mouth daily. 01/18/23   Alfredo Martinez, MD  venlafaxine XR (EFFEXOR-XR) 150 MG 24 hr capsule Take 150 mg by mouth at bedtime. Take with 75 mg to equal 225 mg daily 10/26/19   [provider]  venlafaxine XR (EFFEXOR-XR) 75 MG 24 hr capsule Take 75 mg by mouth at bedtime. Take with 150 mg to equal 225 mg daily 10/16/21   [provider]  XYLITOL MT Use as directed 2 tablets in the mouth or throat daily as needed (dry  mouth).    [provider]      Allergies    Contrast media [iodinated contrast media], Iodine, Duloxetine, Pregabalin, Clarithromycin, and Spironolactone-hctz    Review of Systems   Review of Systems  Genitourinary:  Positive for dysuria.    Physical Exam Updated Vital Signs BP (!) 156/86 (BP Location: Right Arm)   Pulse (!) 101   Temp 98.2 F (36.8 C) (Oral)   Resp 20   Ht 5\' 4"  (1.626 m)   Wt 88.5 kg   SpO2 95%   BMI 33.49 kg/m  Physical Exam Vitals and nursing note reviewed.  Constitutional:      General: She is not in acute  distress.    Appearance: She is well-developed. She is not diaphoretic.  HENT:     Head: Normocephalic and atraumatic.     Right Ear: External ear normal.     Left Ear: External ear normal.     Nose: Nose normal.     Mouth/Throat:     Mouth: Mucous membranes are moist.  Eyes:     General: No scleral icterus.    Conjunctiva/sclera: Conjunctivae normal.  Cardiovascular:     Rate and Rhythm: Normal rate and regular rhythm.     Heart sounds: Normal heart sounds. No murmur heard.    No friction rub. No gallop.  Pulmonary:     Effort: Pulmonary effort is normal. No respiratory distress.     Breath sounds: Normal breath sounds.  Abdominal:     General: Bowel sounds are normal. There is no distension.     Palpations: Abdomen is soft. There is no mass.     Tenderness: There is no abdominal tenderness. There is no right CVA tenderness, left CVA tenderness or guarding.  Musculoskeletal:     Cervical back: Normal range of motion.  Skin:    General: Skin is warm and dry.  Neurological:     Mental Status: She is alert and oriented to person, place, and time.  Psychiatric:        Behavior: Behavior normal.     ED Results / Procedures / Treatments   Labs (all labs ordered are listed, but only abnormal results are displayed) Labs Reviewed  URINALYSIS, W/ REFLEX TO CULTURE (INFECTION SUSPECTED) - Abnormal; Notable for the following components:      Result Value   Leukocytes,Ua SMALL (*)    Bacteria, UA FEW (*)    All other components within normal limits  URINALYSIS, W/ REFLEX TO CULTURE (INFECTION SUSPECTED)  CBC  BASIC METABOLIC PANEL    EKG None  Radiology No results found.  Procedures Procedures  {Document cardiac monitor, telemetry assessment procedure when appropriate:1}  Medications Ordered in ED Medications - No data to display  ED Course/ Medical Decision Making/ A&P   {   Click here for ABCD2, HEART and other calculatorsREFRESH Note before signing :1}                           Medical Decision Making Amount and/or Complexity of Data Reviewed Labs: ordered.   ***  {Document critical care time when appropriate:1} {Document review of labs and clinical decision tools ie heart score, Chads2Vasc2 etc:1}  {Document your independent review of radiology images, and any outside records:1} {Document your discussion with family members, caretakers, and with consultants:1} {Document social determinants of health affecting pt's care:1} {Document your decision making why or why not admission, treatments were needed:1} Final Clinical Impression(s) /  ED Diagnoses Final diagnoses:  None    Rx / DC Orders ED Discharge Orders     None

## 2023-02-20 NOTE — ED Triage Notes (Signed)
Patient had a spinal stimulator placed a week ago. She is concerned she had a UTI. She went to urgent care and they told her she had a UTI and given was antibiotics. They then called and told her to stop taking them. She is still having pain she urinates.

## 2023-02-20 NOTE — ED Notes (Signed)
Post void 

## 2023-02-21 ENCOUNTER — Other Ambulatory Visit: Payer: Self-pay | Admitting: Neurosurgery

## 2023-02-21 DIAGNOSIS — T85192S Other mechanical complication of implanted electronic neurostimulator (electrode) of spinal cord, sequela: Secondary | ICD-10-CM

## 2023-02-21 DIAGNOSIS — Z9689 Presence of other specified functional implants: Secondary | ICD-10-CM

## 2023-02-21 MED ORDER — METHOCARBAMOL 500 MG PO TABS: 500.0000 mg | ORAL_TABLET | Freq: Four times a day (QID) | ORAL | 0 refills | Status: DC

## 2023-02-21 MED ORDER — OXYCODONE HCL 5 MG PO TABS
5.0000 mg | ORAL_TABLET | Freq: Four times a day (QID) | ORAL | 0 refills | Status: AC | PRN
Start: 2023-02-21 — End: 2023-02-28

## 2023-03-04 ENCOUNTER — Ambulatory Visit (INDEPENDENT_AMBULATORY_CARE_PROVIDER_SITE_OTHER): Payer: Medicare Other | Admitting: Physician Assistant

## 2023-03-04 ENCOUNTER — Encounter: Payer: Self-pay | Admitting: Neurosurgery

## 2023-03-04 ENCOUNTER — Ambulatory Visit (INDEPENDENT_AMBULATORY_CARE_PROVIDER_SITE_OTHER): Payer: Medicare Other | Admitting: Neurosurgery

## 2023-03-04 VITALS — BP 118/72 | Temp 98.8°F | Ht 65.0 in | Wt 195.0 lb

## 2023-03-04 VITALS — BP 139/82 | HR 111 | Ht 65.0 in | Wt 195.0 lb

## 2023-03-04 DIAGNOSIS — T85192D Other mechanical complication of implanted electronic neurostimulator (electrode) of spinal cord, subsequent encounter: Secondary | ICD-10-CM

## 2023-03-04 DIAGNOSIS — Z09 Encounter for follow-up examination after completed treatment for conditions other than malignant neoplasm: Secondary | ICD-10-CM

## 2023-03-04 DIAGNOSIS — R300 Dysuria: Secondary | ICD-10-CM | POA: Diagnosis not present

## 2023-03-04 DIAGNOSIS — Z9689 Presence of other specified functional implants: Secondary | ICD-10-CM

## 2023-03-04 LAB — URINALYSIS, COMPLETE
Bilirubin, UA: NEGATIVE
Glucose, UA: NEGATIVE
Ketones, UA: NEGATIVE
Nitrite, UA: NEGATIVE
Protein,UA: NEGATIVE
Specific Gravity, UA: 1.02 (ref 1.005–1.030)
Urobilinogen, Ur: 0.2 mg/dL (ref 0.2–1.0)
pH, UA: 5.5 (ref 5.0–7.5)

## 2023-03-04 LAB — MICROSCOPIC EXAMINATION

## 2023-03-04 LAB — BLADDER SCAN AMB NON-IMAGING: Scan Result: 83

## 2023-03-04 NOTE — Progress Notes (Signed)
   REFERRING PHYSICIAN:  Patrice Paradise, Md [address] [facility] [place],  [place] [address]  DOS: 02/01/23 placement on new pulse generator for SCS  HISTORY OF PRESENT ILLNESS: Lisa Good is status post placement of new pulse generator for SCS.   She has unfortunately not had great coverage of her pain.  She has tried multiple different programs.   PHYSICAL EXAMINATION:  General: Patient is well developed, well nourished, calm, collected, and in no apparent distress.   NEUROLOGICAL:  General: In no acute distress.   Awake, alert, oriented to person, place, and time.  Pupils equal round and reactive to light.  Facial tone is symmetric.     Strength:            Side Iliopsoas Quads Hamstring PF DF EHL  R 5 5 5 5 5 5   L 5 5 5 5 5 5    Incisions are c/d/i   ROS (Neurologic):  Negative except as noted above  IMAGING: Nothing new to review.   ASSESSMENT/PLAN:  Lisa Good is doing reasonable s/p above surgery.   She will work with the rep to see whether we can find a program that works for her.  I am optimistic that they will find a program that works.  Lisa Good Department of neurosurgery

## 2023-03-04 NOTE — Progress Notes (Signed)
03/04/2023 12:50 PM   Lisa Good 1951/04/11 [id number]  CC: Chief Complaint  Patient presents with   Follow-up   HPI: Lisa Good is a 72 y.o. female with a recent history of urinary retention in the setting of intravesical Botox followed by spinal stimulator pulse generator replacement under anesthesia who presents today for ED follow-up of possible cystitis.  She sought care at urgent care about 2 weeks ago and was placed on antibiotics, but that was instructed to stop them presumably based on UA/culture results.  She followed up in the emergency department 12 days ago with persistent dysuria.  UA was notable for small 6-10 WBC/hpf, few bacteria, and budding yeast.  She was treated with Diflucan 150 mg x 1 dose and Bactrim DS twice daily x 7 days.  No urine culture sent.  Today she reports she remains on Bactrim with a couple of days remaining.  She thinks her symptoms have improved somewhat on this medication, but she still having some dysuria especially with initiation.  In-office UA today positive for trace intact blood and trace leukocytes; urine microscopy with 6-10 WBCs/HPF and 3-10 RBCs/HPF. PVR 83mL.  PMH: Past Medical History:  Diagnosis Date   Allergy    seasonal   Arthritis    Cataract    Cerebral aneurysm    Chronic kidney disease    Chronic pain syndrome    Complication of anesthesia    trouble waking up after back surgery   DDD (degenerative disc disease), lumbar    Depression    DVT (deep venous thrombosis) (HCC) 2002   Fibromyalgia    GERD (gastroesophageal reflux disease)    Heart murmur    Heart dr said normal for me   History of kidney stones    Hyperlipidemia    Hypertension    Hypothyroidism    Incontinence in female    Iron deficiency anemia 11/01/2019   Irregular heart beat    Neuromuscular disorder (HCC)    peripheral neuropathy/feet   Obesity    OSA on CPAP    Pancolitis (HCC)    Pre-diabetes    RLS (restless legs syndrome)    UTI  (urinary tract infection) 11/2022    Surgical History: Past Surgical History:  Procedure Laterality Date   BRAIN SURGERY  2002   aneurysm   CEREBRAL ANEURYSM REPAIR  2002   COLONOSCOPY WITH PROPOFOL N/A 11/10/2019   Procedure: COLONOSCOPY WITH PROPOFOL;  Surgeon: Wyline Mood, MD;  Location: [facility];  Service: Endoscopy;  Laterality: N/A;   COSMETIC SURGERY     ESOPHAGOGASTRODUODENOSCOPY (EGD) WITH PROPOFOL N/A 11/10/2019   Procedure: ESOPHAGOGASTRODUODENOSCOPY (EGD) WITH PROPOFOL;  Surgeon: Wyline Mood, MD;  Location: [facility];  Service: Endoscopy;  Laterality: N/A;   EXTRACORPOREAL SHOCK WAVE LITHOTRIPSY Right 04/27/2019   Procedure: EXTRACORPOREAL SHOCK WAVE LITHOTRIPSY (ESWL);  Surgeon: Sondra Come, MD;  Location: [facility];  Service: Urology;  Laterality: Right;   EYE SURGERY     JOINT REPLACEMENT Bilateral    knee   KNEE ARTHROPLASTY Left 01/12/2020   Procedure: COMPUTER ASSISTED TOTAL KNEE ARTHROPLASTY;  Surgeon: Donato Heinz, MD;  Location: [facility];  Service: Orthopedics;  Laterality: Left;   POLYPECTOMY  11/10/2019   Procedure: POLYPECTOMY;  Surgeon: Wyline Mood, MD;  Location: [facility];  Service: Endoscopy;;   REVERSE SHOULDER ARTHROPLASTY Right 01/06/2022   Procedure: REVERSE SHOULDER ARTHROPLASTY;  Surgeon: Christena Flake, MD;  Location: [facility];  Service: Orthopedics;  Laterality: Right;  SPINAL CORD STIMULATOR BATTERY EXCHANGE N/A 02/01/2023   Procedure: INTERNAL PULSE GENERATOR REPLACEMENT ([employer]);  Surgeon: Venetia Night, MD;  Location: [facility];  Service: Neurosurgery;  Laterality: N/A;  LOCAL WITH MAC   SPINAL FUSION  07/10/2019   C4-5 corpectomy with C6-7 ACDF, C2-T3 spinal fusion   SPINE SURGERY     spinal fusion   THORACIC LAMINECTOMY FOR SPINAL CORD STIMULATOR N/A 12/25/2022   Procedure: THORACIC LAMINECTOMY FOR SPINAL CORD STIMULATOR IMPLANTATION ([employer]);  Surgeon: Venetia Night, MD;   Location: [facility];  Service: Neurosurgery;  Laterality: N/A;   TUBAL LIGATION      Home Medications:  Allergies as of 03/04/2023       Reactions   Contrast Media [iodinated Contrast Media] Hives   Iodine Hives   Duloxetine Swelling   Legs and feet   Pregabalin Swelling   Legs and feet   Clarithromycin Nausea Only, Other (See Comments)   Spironolactone-hctz Rash   Only with iv injection        Medication List        Accurate as of Mar 04, 2023 12:50 PM. If you have any questions, ask your nurse or doctor.          Alpha-Lipoic Acid 600 MG Caps Take 600 mg by mouth 2 (two) times daily.   amLODipine 10 MG tablet Commonly known as: NORVASC Take 10 mg by mouth at bedtime.   atorvastatin 40 MG tablet Commonly known as: LIPITOR Take 40 mg by mouth every morning.   ciprofloxacin 500 MG tablet Commonly known as: CIPRO Take 500 mg by mouth 2 (two) times daily.   gabapentin 800 MG tablet Commonly known as: NEURONTIN Take 1 tablet (800 mg total) by mouth 4 (four) times daily. What changed:  when to take this additional instructions   ibuprofen 200 MG tablet Commonly known as: ADVIL Take 400-600 mg by mouth as needed for moderate pain.   ipratropium 0.06 % nasal spray Commonly known as: ATROVENT Place 2 sprays into both nostrils 3 (three) times daily. As needed for nasal congestion, runny nose   lisinopril 40 MG tablet Commonly known as: ZESTRIL Take 40 mg by mouth at bedtime.   methocarbamol 500 MG tablet Commonly known as: ROBAXIN Take 1 tablet (500 mg total) by mouth 4 (four) times daily.   oxyCODONE-acetaminophen 10-325 MG tablet Commonly known as: PERCOCET Take 1 tablet by mouth 4 (four) times daily.   pantoprazole 40 MG tablet Commonly known as: PROTONIX Take 40 mg by mouth every morning.   tiZANidine 4 MG tablet Commonly known as: ZANAFLEX Take by mouth.   traZODone 150 MG tablet Commonly known as: DESYREL Take 1 tablet (150 mg total) by  mouth at bedtime.   venlafaxine XR 150 MG 24 hr capsule Commonly known as: EFFEXOR-XR Take 150 mg by mouth at bedtime. Take with 75 mg to equal 225 mg daily   venlafaxine XR 75 MG 24 hr capsule Commonly known as: EFFEXOR-XR Take 75 mg by mouth at bedtime. Take with 150 mg to equal 225 mg daily   XYLITOL MT Use as directed 2 tablets in the mouth or throat daily as needed (dry mouth).        Allergies:  Allergies  Allergen Reactions   Contrast Media [Iodinated Contrast Media] Hives   Iodine Hives   Duloxetine Swelling    Legs and feet   Pregabalin Swelling    Legs and feet   Clarithromycin Nausea Only and Other (See Comments)  Spironolactone-Hctz Rash    Only with iv injection    Family History: Family History  Problem Relation Age of Onset   Hypertension Mother    Depression Mother    Diabetes Mother    Varicose Veins Mother    Hyperlipidemia Sister    Hypertension Sister    Depression Sister    Varicose Veins Sister    Depression Brother    Arthritis Maternal Grandmother    Diabetes Maternal Grandmother    Sleep apnea Neg Hx     Social History:   reports that she quit smoking about 18 years ago. Her smoking use included cigarettes. She has a 30.00 pack-year smoking history. She has been exposed to tobacco smoke. She has never used smokeless tobacco. She reports that she does not currently use alcohol. She reports that she does not use drugs.  Physical Exam: BP 139/82   Pulse (!) 111   Ht 5\' 5"  (1.651 m)   Wt 195 lb (88.5 kg)   BMI 32.45 kg/m   Constitutional:  Alert and oriented, no acute distress, nontoxic appearing HEENT: [place], AT Cardiovascular: No clubbing, cyanosis, or edema Respiratory: Normal respiratory effort, no increased work of breathing Skin: No rashes, bruises or suspicious lesions Neurologic: Grossly intact, no focal deficits, moving all 4 extremities Psychiatric: Normal mood and affect  Laboratory Data: Results for orders placed or  performed in visit on 03/04/23  Microscopic Examination   Urine  Result Value Ref Range   WBC, UA 6-10 (A) 0 - 5 /hpf   RBC, Urine 3-10 (A) 0 - 2 /hpf   Epithelial Cells (non renal) 0-10 0 - 10 /hpf   Mucus, UA Present (A) Not Estab.   Bacteria, UA Few None seen/Few  Urinalysis, Complete  Result Value Ref Range   Specific Gravity, UA 1.020 1.005 - 1.030   pH, UA 5.5 5.0 - 7.5   Color, UA Yellow Yellow   Appearance Ur Hazy (A) Clear   Leukocytes,UA Trace (A) Negative   Protein,UA Negative Negative/Trace   Glucose, UA Negative Negative   Ketones, UA Negative Negative   RBC, UA Trace (A) Negative   Bilirubin, UA Negative Negative   Urobilinogen, Ur 0.2 0.2 - 1.0 mg/dL   Nitrite, UA Negative Negative   Microscopic Examination See below:   BLADDER SCAN AMB NON-IMAGING  Result Value Ref Range   Scan Result 83 ml    Assessment & Plan:   1. Dysuria UA today with mild pyuria and microscopic hematuria on Bactrim.  Will send for standard and atypical culture today and contact her with her results, treating as indicated.  If cultures are negative and she remains symptomatic, will consider imaging, however I think it is also possible that these urinary symptoms are new in the setting of recent intravesical Botox. - Urinalysis, Complete - BLADDER SCAN AMB NON-IMAGING - CULTURE, URINE COMPREHENSIVE - Mycoplasma / ureaplasma culture  Return for Will call with results.  Carman Ching, PA-C  [facility] [place] [address] [place], [place] [address] [phone]

## 2023-03-09 ENCOUNTER — Encounter: Payer: Medicare Other | Admitting: Neurosurgery

## 2023-03-09 LAB — CULTURE, URINE COMPREHENSIVE

## 2023-03-10 LAB — MYCOPLASMA / UREAPLASMA CULTURE
Mycoplasma hominis Culture: NEGATIVE
Ureaplasma urealyticum: NEGATIVE

## 2023-03-17 ENCOUNTER — Telehealth: Payer: Self-pay

## 2023-03-17 NOTE — Telephone Encounter (Signed)
Pt is on sam's schedule tomorrow for botox f/u but she's already seen her a couple of times since she had her treatment. if she's feeling ok, ok to cancel. LVM for patient to return call.

## 2023-03-18 ENCOUNTER — Ambulatory Visit: Payer: Medicare Other | Admitting: Physician Assistant

## 2023-03-18 ENCOUNTER — Ambulatory Visit (INDEPENDENT_AMBULATORY_CARE_PROVIDER_SITE_OTHER): Payer: Medicare Other | Admitting: Physician Assistant

## 2023-03-18 VITALS — BP 116/69 | HR 96 | Ht 65.0 in | Wt 195.0 lb

## 2023-03-18 DIAGNOSIS — R3129 Other microscopic hematuria: Secondary | ICD-10-CM

## 2023-03-18 DIAGNOSIS — R300 Dysuria: Secondary | ICD-10-CM

## 2023-03-18 LAB — MICROSCOPIC EXAMINATION

## 2023-03-18 LAB — URINALYSIS, COMPLETE
Bilirubin, UA: NEGATIVE
Glucose, UA: NEGATIVE
Ketones, UA: NEGATIVE
Nitrite, UA: NEGATIVE
Protein,UA: NEGATIVE
RBC, UA: NEGATIVE
Specific Gravity, UA: 1.015 (ref 1.005–1.030)
Urobilinogen, Ur: 0.2 mg/dL (ref 0.2–1.0)
pH, UA: 5.5 (ref 5.0–7.5)

## 2023-03-18 LAB — BLADDER SCAN AMB NON-IMAGING: Scan Result: 18

## 2023-03-18 NOTE — Progress Notes (Signed)
03/18/2023 11:30 AM   Lisa Good 1951/08/12 [id number]  CC: Chief Complaint  Patient presents with   Follow-up   HPI: Lisa Good is a 72 y.o. female with a recent history of urinary retention in the setting of intravesical Botox followed by spinal stimulator pulse generator replacement under anesthesia who presents today for follow-up.   I recently ordered standard and atypical cultures on her in the setting of microscopic hematuria, both of which were negative.  Today she reports pressure and slight burning with termination.  Overall she feels the Botox is not particularly helping.  She still has bothersome leaking, frequency, and weak stream.  This was her second Botox treatment, and she does not think either of them worked particularly well.  Dr. Mina Marble notes state they would consider other third line therapies if this failed.  Notably, she stopped daily suppressive trimethoprim.  Her PCP is working her up after her hemoglobin dropped 3 points over the last year.  She wonders if her recent microscopic hematuria could have caused this.  In-office UA today positive for 1+ leukocytes; urine microscopy with 11-30 WBCs/HPF and moderate bacteria. PVR 18mL.  PMH: Past Medical History:  Diagnosis Date   Allergy    seasonal   Arthritis    Cataract    Cerebral aneurysm    Chronic kidney disease    Chronic pain syndrome    Complication of anesthesia    trouble waking up after back surgery   DDD (degenerative disc disease), lumbar    Depression    DVT (deep venous thrombosis) (HCC) 2002   Fibromyalgia    GERD (gastroesophageal reflux disease)    Heart murmur    Heart dr said normal for me   History of kidney stones    Hyperlipidemia    Hypertension    Hypothyroidism    Incontinence in female    Iron deficiency anemia 11/01/2019   Irregular heart beat    Neuromuscular disorder (HCC)    peripheral neuropathy/feet   Obesity    OSA on CPAP    Pancolitis (HCC)     Pre-diabetes    RLS (restless legs syndrome)    UTI (urinary tract infection) 11/2022    Surgical History: Past Surgical History:  Procedure Laterality Date   BRAIN SURGERY  2002   aneurysm   CEREBRAL ANEURYSM REPAIR  2002   COLONOSCOPY WITH PROPOFOL N/A 11/10/2019   Procedure: COLONOSCOPY WITH PROPOFOL;  Surgeon: Wyline Mood, MD;  Location: [facility];  Service: Endoscopy;  Laterality: N/A;   COSMETIC SURGERY     ESOPHAGOGASTRODUODENOSCOPY (EGD) WITH PROPOFOL N/A 11/10/2019   Procedure: ESOPHAGOGASTRODUODENOSCOPY (EGD) WITH PROPOFOL;  Surgeon: Wyline Mood, MD;  Location: [facility];  Service: Endoscopy;  Laterality: N/A;   EXTRACORPOREAL SHOCK WAVE LITHOTRIPSY Right 04/27/2019   Procedure: EXTRACORPOREAL SHOCK WAVE LITHOTRIPSY (ESWL);  Surgeon: Sondra Come, MD;  Location: [facility];  Service: Urology;  Laterality: Right;   EYE SURGERY     JOINT REPLACEMENT Bilateral    knee   KNEE ARTHROPLASTY Left 01/12/2020   Procedure: COMPUTER ASSISTED TOTAL KNEE ARTHROPLASTY;  Surgeon: Donato Heinz, MD;  Location: [facility];  Service: Orthopedics;  Laterality: Left;   POLYPECTOMY  11/10/2019   Procedure: POLYPECTOMY;  Surgeon: Wyline Mood, MD;  Location: [facility];  Service: Endoscopy;;   REVERSE SHOULDER ARTHROPLASTY Right 01/06/2022   Procedure: REVERSE SHOULDER ARTHROPLASTY;  Surgeon: Christena Flake, MD;  Location: [facility];  Service: Orthopedics;  Laterality: Right;  SPINAL CORD STIMULATOR BATTERY EXCHANGE N/A 02/01/2023   Procedure: INTERNAL PULSE GENERATOR REPLACEMENT ([employer]);  Surgeon: Venetia Night, MD;  Location: [facility];  Service: Neurosurgery;  Laterality: N/A;  LOCAL WITH MAC   SPINAL FUSION  07/10/2019   C4-5 corpectomy with C6-7 ACDF, C2-T3 spinal fusion   SPINE SURGERY     spinal fusion   THORACIC LAMINECTOMY FOR SPINAL CORD STIMULATOR N/A 12/25/2022   Procedure: THORACIC LAMINECTOMY FOR SPINAL CORD STIMULATOR IMPLANTATION  ([employer]);  Surgeon: Venetia Night, MD;  Location: [facility];  Service: Neurosurgery;  Laterality: N/A;   TUBAL LIGATION      Home Medications:  Allergies as of 03/18/2023       Reactions   Contrast Media [iodinated Contrast Media] Hives   Iodine Hives   Duloxetine Swelling   Legs and feet   Pregabalin Swelling   Legs and feet   Clarithromycin Nausea Only, Other (See Comments)   Spironolactone-hctz Rash   Only with iv injection        Medication List        Accurate as of March 18, 2023 11:30 AM. If you have any questions, ask your nurse or doctor.          Alpha-Lipoic Acid 600 MG Caps Take 600 mg by mouth 2 (two) times daily.   amLODipine 10 MG tablet Commonly known as: NORVASC Take 10 mg by mouth at bedtime.   atorvastatin 40 MG tablet Commonly known as: LIPITOR Take 40 mg by mouth every morning.   ciprofloxacin 500 MG tablet Commonly known as: CIPRO Take 500 mg by mouth 2 (two) times daily.   gabapentin 800 MG tablet Commonly known as: NEURONTIN Take 1 tablet (800 mg total) by mouth 4 (four) times daily. What changed:  when to take this additional instructions   ibuprofen 200 MG tablet Commonly known as: ADVIL Take 400-600 mg by mouth as needed for moderate pain.   ipratropium 0.06 % nasal spray Commonly known as: ATROVENT Place 2 sprays into both nostrils 3 (three) times daily. As needed for nasal congestion, runny nose   lisinopril 40 MG tablet Commonly known as: ZESTRIL Take 40 mg by mouth at bedtime.   methocarbamol 500 MG tablet Commonly known as: ROBAXIN Take 1 tablet (500 mg total) by mouth 4 (four) times daily.   oxyCODONE-acetaminophen 10-325 MG tablet Commonly known as: PERCOCET Take 1 tablet by mouth 4 (four) times daily.   pantoprazole 40 MG tablet Commonly known as: PROTONIX Take 40 mg by mouth every morning.   tiZANidine 4 MG tablet Commonly known as: ZANAFLEX Take by mouth.   traZODone 150 MG  tablet Commonly known as: DESYREL Take 1 tablet (150 mg total) by mouth at bedtime.   venlafaxine XR 150 MG 24 hr capsule Commonly known as: EFFEXOR-XR Take 150 mg by mouth at bedtime. Take with 75 mg to equal 225 mg daily   venlafaxine XR 75 MG 24 hr capsule Commonly known as: EFFEXOR-XR Take 75 mg by mouth at bedtime. Take with 150 mg to equal 225 mg daily   XYLITOL MT Use as directed 2 tablets in the mouth or throat daily as needed (dry mouth).        Allergies:  Allergies  Allergen Reactions   Contrast Media [Iodinated Contrast Media] Hives   Iodine Hives   Duloxetine Swelling    Legs and feet   Pregabalin Swelling    Legs and feet   Clarithromycin Nausea Only and Other (See Comments)  Spironolactone-Hctz Rash    Only with iv injection    Family History: Family History  Problem Relation Age of Onset   Hypertension Mother    Depression Mother    Diabetes Mother    Varicose Veins Mother    Hyperlipidemia Sister    Hypertension Sister    Depression Sister    Varicose Veins Sister    Depression Brother    Arthritis Maternal Grandmother    Diabetes Maternal Grandmother    Sleep apnea Neg Hx     Social History:   reports that she quit smoking about 18 years ago. Her smoking use included cigarettes. She has a 30.00 pack-year smoking history. She has been exposed to tobacco smoke. She has never used smokeless tobacco. She reports that she does not currently use alcohol. She reports that she does not use drugs.  Physical Exam: BP 116/69   Pulse 96   Ht 5\' 5"  (1.651 m)   Wt 195 lb (88.5 kg)   BMI 32.45 kg/m   Constitutional:  Alert and oriented, no acute distress, nontoxic appearing HEENT: [place], AT Cardiovascular: No clubbing, cyanosis, or edema Respiratory: Normal respiratory effort, no increased work of breathing Skin: No rashes, bruises or suspicious lesions Neurologic: Grossly intact, no focal deficits, moving all 4 extremities Psychiatric: Normal mood  and affect  Laboratory Data: Results for orders placed or performed in visit on 03/18/23  Microscopic Examination   Urine  Result Value Ref Range   WBC, UA 11-30 (A) 0 - 5 /hpf   RBC, Urine 0-2 0 - 2 /hpf   Epithelial Cells (non renal) 0-10 0 - 10 /hpf   Bacteria, UA Moderate (A) None seen/Few  Urinalysis, Complete  Result Value Ref Range   Specific Gravity, UA 1.015 1.005 - 1.030   pH, UA 5.5 5.0 - 7.5   Color, UA Yellow Yellow   Appearance Ur Clear Clear   Leukocytes,UA 1+ (A) Negative   Protein,UA Negative Negative/Trace   Glucose, UA Negative Negative   Ketones, UA Negative Negative   RBC, UA Negative Negative   Bilirubin, UA Negative Negative   Urobilinogen, Ur 0.2 0.2 - 1.0 mg/dL   Nitrite, UA Negative Negative   Microscopic Examination See below:   BLADDER SCAN AMB NON-IMAGING  Result Value Ref Range   Scan Result 18 ml    Assessment & Plan:   1. Dysuria Pressure and mild dysuria with termination.  She is emptying appropriately.  Differential includes acute cystitis versus Botox side effect.  Will repeat a culture and treat as indicated.  Will consider resuming trimethoprim based on culture results.  Will call her with results. - BLADDER SCAN AMB NON-IMAGING - Urinalysis, Complete - CULTURE, URINE COMPREHENSIVE  2. Microscopic hematuria Resolved.  We discussed that I do not anticipate her recent hemoglobin drop to be attributable to urologic sources, as she has had only 1 instance of microscopic hematuria as far as I can tell per chart review over the past year.  Return in about 4 weeks (around 04/15/2023) for OB follow-up with Dr. Sherron Monday, Will call with results.  Carman Ching, PA-C  [facility] [place] [address], [address] [place], [place] [address] [phone]

## 2023-03-21 LAB — CULTURE, URINE COMPREHENSIVE

## 2023-04-01 DIAGNOSIS — R195 Other fecal abnormalities: Secondary | ICD-10-CM | POA: Insufficient documentation

## 2023-04-01 DIAGNOSIS — R1313 Dysphagia, pharyngeal phase: Secondary | ICD-10-CM | POA: Insufficient documentation

## 2023-04-13 ENCOUNTER — Telehealth: Payer: Self-pay

## 2023-04-13 ENCOUNTER — Other Ambulatory Visit: Payer: Self-pay | Admitting: Nurse Practitioner

## 2023-04-13 NOTE — Progress Notes (Deleted)
PATIENT: Lisa Good DOB: 1951-02-03  REASON FOR VISIT: follow up HISTORY FROM: patient  No chief complaint on file.    HISTORY OF PRESENT ILLNESS:  04/13/23 ALL:  Lisa Good returns for follow up for OSA on CPAP. She was last seen 06/2022 and struggling to meet compliance. Since,   She is followed by [facility] for chronic neck pain and neuropathy.   06/29/2022 ALL: Lisa Good is a 72 y.o. female here today for follow up for OSA on CPAP. She has not been as compliant with usage over the past 2-3 months. She reports being ill with norovirus, recently. She feels that CPAP causes her to have nasal congestion. She doesn't use CPAP when her grandson stays with her. She may feel a bit better when using CPAP. She feels that she sleeps a little longer when she uses her machine.     HISTORY: (copied from Dr Teofilo Pod previous note)  Lisa Good is a 72 year old right-handed woman with an underlying medical history of allergic rhinitis, arthritis, chondromalacia, degenerative lumbar disc disease, fibromyalgia, reflux disease, hypertension, chronic kidney disease (addendum, 02/09/2022: patient reports no hx of kidney d/s, Hx was taken from PCP note from 08/12/21), peripheral neuropathy, thyroid disease, history of DVT, depression, and obesity, who presents for follow-up consultation of her obstructive sleep apnea, after interim testing and starting AutoPap therapy.  The patient is accompanied by her son today.  I first met her at the request of her primary care physician on 1123, at which time she reported a prior diagnosis of sleep apnea.  She was diagnosed in 2016 and she was on auto BiPAP therapy but has not used her machine in approximately 1 year with only limited usage noted in 2022.  She was advised to proceed with reevaluation with a sleep study.  She had a home sleep test on 11/24/2021 which indicated severe sleep apnea, mostly obstructive with some central events, total AHI of 100.6/h, O2  nadir 83%, snoring ranged from mild to loud.  She is advised to proceed with treatment with AutoPap therapy with the possibility of bringing her back for a titration study as she did have some central apneas with a an AHIc of 18.5/h. Her set up date was 12/03/2021.  She has a ResMed air sense 11 AutoSet machine.   Today, 02/09/2022: I reviewed her AutoPap compliance data from 12/03/2021 through 01/01/2022, which is a total of 30 days, during which time she used her machine 29 days with percent use days greater than 4 hours at 87%, indicating very good compliance with an average usage of 5 hours and 14 minutes, residual AHI elevated at 16/h with mostly central events at 10.2/h, average pressure for the 95th percentile at 11.9 cm with a range of 7 to 14 cm with EPR of 3, leak on the lower side with the 95th percentile at 3.3 L/min.  She reports that her AutoPap is going well.  She continues to have sleep disruption from discomfort in her legs and also nocturia which is about 4 times per average night.  She is followed by pain management and takes 10 mg of oxycodone 4 times a day.  She is also on gabapentin and Robaxin.  She takes high-dose Effexor and also takes trazodone.  She is motivated to continue with her AutoPap, she feels that she sleeps better.  REVIEW OF SYSTEMS: Out of a complete 14 system review of symptoms, the patient complains only of the following symptoms, chronic pain and all other  reviewed systems are negative.  ESS: 13/24  ALLERGIES: Allergies  Allergen Reactions   Contrast Media [Iodinated Contrast Media] Hives   Iodine Hives   Duloxetine Swelling    Legs and feet   Pregabalin Swelling    Legs and feet   Clarithromycin Nausea Only and Other (See Comments)   Spironolactone-Hctz Rash    Only with iv injection    HOME MEDICATIONS: Outpatient Medications Prior to Visit  Medication Sig Dispense Refill   Alpha-Lipoic Acid 600 MG CAPS Take 600 mg by mouth 2 (two) times daily.      amLODipine (NORVASC) 10 MG tablet Take 10 mg by mouth at bedtime.     atorvastatin (LIPITOR) 40 MG tablet Take 40 mg by mouth every morning.     ciprofloxacin (CIPRO) 500 MG tablet Take 500 mg by mouth 2 (two) times daily.     gabapentin (NEURONTIN) 800 MG tablet Take 1 tablet (800 mg total) by mouth 4 (four) times daily. (Patient taking differently: Take 800 mg by mouth 3 (three) times daily. 1 in am, 1 at lunch and 2 at bedtime) 120 tablet 3   ibuprofen (ADVIL) 200 MG tablet Take 400-600 mg by mouth as needed for moderate pain.     ipratropium (ATROVENT) 0.06 % nasal spray Place 2 sprays into both nostrils 3 (three) times daily. As needed for nasal congestion, runny nose 15 mL 1   lisinopril (PRINIVIL,ZESTRIL) 40 MG tablet Take 40 mg by mouth at bedtime.     methocarbamol (ROBAXIN) 500 MG tablet Take 1 tablet (500 mg total) by mouth 4 (four) times daily. 120 tablet 0   oxyCODONE-acetaminophen (PERCOCET) 10-325 MG tablet Take 1 tablet by mouth 4 (four) times daily.     pantoprazole (PROTONIX) 40 MG tablet Take 40 mg by mouth every morning.     traZODone (DESYREL) 150 MG tablet Take 1 tablet (150 mg total) by mouth at bedtime. 90 tablet 0   venlafaxine XR (EFFEXOR-XR) 150 MG 24 hr capsule Take 150 mg by mouth at bedtime. Take with 75 mg to equal 225 mg daily     venlafaxine XR (EFFEXOR-XR) 75 MG 24 hr capsule Take 75 mg by mouth at bedtime. Take with 150 mg to equal 225 mg daily     XYLITOL MT Use as directed 2 tablets in the mouth or throat daily as needed (dry mouth).     No facility-administered medications prior to visit.    PAST MEDICAL HISTORY: Past Medical History:  Diagnosis Date   Allergy    seasonal   Arthritis    Cataract    Cerebral aneurysm    Chronic kidney disease    Chronic pain syndrome    Complication of anesthesia    trouble waking up after back surgery   DDD (degenerative disc disease), lumbar    Depression    DVT (deep venous thrombosis) (HCC) 2002   Fibromyalgia     GERD (gastroesophageal reflux disease)    Heart murmur    Heart dr said normal for me   History of kidney stones    Hyperlipidemia    Hypertension    Hypothyroidism    Incontinence in female    Iron deficiency anemia 11/01/2019   Irregular heart beat    Neuromuscular disorder (HCC)    peripheral neuropathy/feet   Obesity    OSA on CPAP    Pancolitis (HCC)    Pre-diabetes    RLS (restless legs syndrome)    UTI (urinary tract infection) 11/2022  PAST SURGICAL HISTORY: Past Surgical History:  Procedure Laterality Date   BRAIN SURGERY  2002   aneurysm   CEREBRAL ANEURYSM REPAIR  2002   COLONOSCOPY WITH PROPOFOL N/A 11/10/2019   Procedure: COLONOSCOPY WITH PROPOFOL;  Surgeon: Wyline Mood, MD;  Location: [facility];  Service: Endoscopy;  Laterality: N/A;   COSMETIC SURGERY     ESOPHAGOGASTRODUODENOSCOPY (EGD) WITH PROPOFOL N/A 11/10/2019   Procedure: ESOPHAGOGASTRODUODENOSCOPY (EGD) WITH PROPOFOL;  Surgeon: Wyline Mood, MD;  Location: [facility];  Service: Endoscopy;  Laterality: N/A;   EXTRACORPOREAL SHOCK WAVE LITHOTRIPSY Right 04/27/2019   Procedure: EXTRACORPOREAL SHOCK WAVE LITHOTRIPSY (ESWL);  Surgeon: Sondra Come, MD;  Location: [facility];  Service: Urology;  Laterality: Right;   EYE SURGERY     JOINT REPLACEMENT Bilateral    knee   KNEE ARTHROPLASTY Left 01/12/2020   Procedure: COMPUTER ASSISTED TOTAL KNEE ARTHROPLASTY;  Surgeon: Donato Heinz, MD;  Location: [facility];  Service: Orthopedics;  Laterality: Left;   POLYPECTOMY  11/10/2019   Procedure: POLYPECTOMY;  Surgeon: Wyline Mood, MD;  Location: [facility];  Service: Endoscopy;;   REVERSE SHOULDER ARTHROPLASTY Right 01/06/2022   Procedure: REVERSE SHOULDER ARTHROPLASTY;  Surgeon: Christena Flake, MD;  Location: [facility];  Service: Orthopedics;  Laterality: Right;   SPINAL CORD STIMULATOR BATTERY EXCHANGE N/A 02/01/2023   Procedure: INTERNAL PULSE GENERATOR REPLACEMENT ([place]  SCIENTIFIC);  Surgeon: Venetia Night, MD;  Location: [facility];  Service: Neurosurgery;  Laterality: N/A;  LOCAL WITH MAC   SPINAL FUSION  07/10/2019   C4-5 corpectomy with C6-7 ACDF, C2-T3 spinal fusion   SPINE SURGERY     spinal fusion   THORACIC LAMINECTOMY FOR SPINAL CORD STIMULATOR N/A 12/25/2022   Procedure: THORACIC LAMINECTOMY FOR SPINAL CORD STIMULATOR IMPLANTATION ([place] SCIENTIFIC);  Surgeon: Venetia Night, MD;  Location: [facility];  Service: Neurosurgery;  Laterality: N/A;   TUBAL LIGATION      FAMILY HISTORY: Family History  Problem Relation Age of Onset   Hypertension Mother    Depression Mother    Diabetes Mother    Varicose Veins Mother    Hyperlipidemia Sister    Hypertension Sister    Depression Sister    Varicose Veins Sister    Depression Brother    Arthritis Maternal Grandmother    Diabetes Maternal Grandmother    Sleep apnea Neg Hx     SOCIAL HISTORY: Social History   Socioeconomic History   Marital status: Married    Spouse name: Not on file   Number of children: Not on file   Years of education: Not on file   Highest education level: Not on file  Occupational History   Occupation: retired   Tobacco Use   Smoking status: Former    Packs/day: 1.50    Years: 20.00    Additional pack years: 0.00    Total pack years: 30.00    Types: Cigarettes    Quit date: 10/29/2004    Years since quitting: 18.4    Passive exposure: Past   Smokeless tobacco: Never  Vaping Use   Vaping Use: Never used  Substance and Sexual Activity   Alcohol use: Not Currently    Comment: Rare social drinking   Drug use: Never   Sexual activity: Not Currently    Birth control/protection: Surgical, Post-menopausal  Other Topics Concern   Not on file  Social History Narrative   Not on file   Social Determinants of Health   Financial Resource Strain: Low Risk  (  07/20/2019)   Overall Financial Resource Strain (CARDIA)    Difficulty of Paying Living Expenses:  Not hard at all  Food Insecurity: No Food Insecurity (07/20/2019)   Hunger Vital Sign    Worried About Running Out of Food in the Last Year: Never true    Ran Out of Food in the Last Year: Never true  Transportation Needs: No Transportation Needs (07/20/2019)   PRAPARE - Administrator, Civil Service (Medical): No    Lack of Transportation (Non-Medical): No  Physical Activity: Unknown (07/20/2019)   Exercise Vital Sign    Days of Exercise per Week: Patient declined    Minutes of Exercise per Session: Patient declined  Stress: Unknown (07/20/2019)   [employer] of Occupational Health - Occupational Stress Questionnaire    Feeling of Stress : Patient declined  Social Connections: Unknown (07/20/2019)   Social Connection and Isolation Panel [NHANES]    Frequency of Communication with Friends and Family: Patient declined    Frequency of Social Gatherings with Friends and Family: Patient declined    Attends Religious Services: Patient declined    Database administrator or Organizations: Patient declined    Attends Banker Meetings: Patient declined    Marital Status: Patient declined  Intimate Partner Violence: Not At Risk (07/20/2019)   Humiliation, Afraid, Rape, and Kick questionnaire    Fear of Current or Ex-Partner: No    Emotionally Abused: No    Physically Abused: No    Sexually Abused: No     PHYSICAL EXAM  There were no vitals filed for this visit.  There is no height or weight on file to calculate BMI.  Generalized: Well developed, in no acute distress  Cardiology: normal rate and rhythm, no murmur noted Respiratory: clear to auscultation bilaterally  Neurological examination  Mentation: Alert oriented to time, place, history taking. Follows all commands speech and language fluent Cranial nerve II-XII: Pupils were equal round reactive to light. Extraocular movements were full, visual field were full  Motor: The motor testing reveals 5  over 5 strength of all 4 extremities. Good symmetric motor tone is noted throughout.  Gait and station: Gait is normal.    DIAGNOSTIC DATA (LABS, IMAGING, TESTING) - I reviewed patient records, labs, notes, testing and imaging myself where available.      No data to display           Lab Results  Component Value Date   WBC 6.3 02/20/2023   HGB 10.1 (L) 02/20/2023   HCT 34.2 (L) 02/20/2023   MCV 79.9 (L) 02/20/2023   PLT 326 02/20/2023      Component Value Date/Time   NA 139 02/20/2023 1944   K 3.8 02/20/2023 1944   CL 101 02/20/2023 1944   CO2 26 02/20/2023 1944   GLUCOSE 114 (H) 02/20/2023 1944   BUN 13 02/20/2023 1944   CREATININE 0.96 02/20/2023 1944   CALCIUM 9.3 02/20/2023 1944   PROT 6.3 (L) 01/01/2023 1922   ALBUMIN 3.1 (L) 01/01/2023 1922   AST 16 01/01/2023 1922   ALT 11 01/01/2023 1922   ALKPHOS 90 01/01/2023 1922   BILITOT 0.4 01/01/2023 1922   GFRNONAA >60 02/20/2023 1944   GFRAA >60 01/02/2020 1229   No results found for: "CHOL", "HDL", "LDLCALC", "LDLDIRECT", "TRIG", "CHOLHDL" No results found for: "HGBA1C" Lab Results  Component Value Date   VITAMINB12 256 10/31/2019   No results found for: "TSH"   ASSESSMENT AND PLAN 72 y.o. year old  female  has a past medical history of Allergy, Arthritis, Cataract, Cerebral aneurysm, Chronic kidney disease, Chronic pain syndrome, Complication of anesthesia, DDD (degenerative disc disease), lumbar, Depression, DVT (deep venous thrombosis) (HCC) (2002), Fibromyalgia, GERD (gastroesophageal reflux disease), Heart murmur, History of kidney stones, Hyperlipidemia, Hypertension, Hypothyroidism, Incontinence in female, Iron deficiency anemia (11/01/2019), Irregular heart beat, Neuromuscular disorder (HCC), Obesity, OSA on CPAP, Pancolitis (HCC), Pre-diabetes, RLS (restless legs syndrome), and UTI (urinary tract infection) (11/2022). here with   No diagnosis found.    Lisa Good is doing well on CPAP therapy.  Compliance report reveals sub optimal compliance. She reports multiple factors contributing to lower compliance. She was encouraged to continue using CPAP nightly and for greater than 4 hours each night. We will update supply orders as indicated. Risks of untreated sleep apnea review and education materials provided. Healthy lifestyle habits encouraged. She will monitor her blood pressure closely and let PCP know if readings remain elevated. She will follow up in 6 months, sooner if needed. She verbalizes understanding and agreement with this plan.    No orders of the defined types were placed in this encounter.    No orders of the defined types were placed in this encounter.     Shawnie Dapper, FNP-C 04/13/2023, 4:24 PM [facility] [address] [place], [place] [address] [phone]

## 2023-04-14 ENCOUNTER — Inpatient Hospital Stay: Payer: Medicare Other | Admitting: Oncology

## 2023-04-14 ENCOUNTER — Inpatient Hospital Stay: Payer: Medicare Other

## 2023-04-14 ENCOUNTER — Ambulatory Visit: Payer: Medicare Other | Admitting: Family Medicine

## 2023-04-14 ENCOUNTER — Encounter: Payer: Self-pay | Admitting: Family Medicine

## 2023-04-14 DIAGNOSIS — G4733 Obstructive sleep apnea (adult) (pediatric): Secondary | ICD-10-CM

## 2023-04-19 ENCOUNTER — Encounter: Payer: Self-pay | Admitting: Urology

## 2023-04-19 ENCOUNTER — Ambulatory Visit: Payer: Medicare Other | Admitting: Urology

## 2023-04-21 ENCOUNTER — Ambulatory Visit: Payer: Medicare Other | Admitting: Nurse Practitioner

## 2023-04-22 ENCOUNTER — Encounter: Payer: Self-pay | Admitting: Neurosurgery

## 2023-04-23 ENCOUNTER — Ambulatory Visit
Admission: EM | Admit: 2023-04-23 | Discharge: 2023-04-23 | Disposition: A | Discharge status: Home / Self Care | Payer: Medicare Other | Attending: Family Medicine | Admitting: Family Medicine

## 2023-04-23 ENCOUNTER — Encounter: Payer: Self-pay | Admitting: Neurosurgery

## 2023-04-23 DIAGNOSIS — Z20822 Contact with and (suspected) exposure to covid-19: Secondary | ICD-10-CM | POA: Insufficient documentation

## 2023-04-23 DIAGNOSIS — J069 Acute upper respiratory infection, unspecified: Secondary | ICD-10-CM

## 2023-04-23 MED ORDER — KETOROLAC TROMETHAMINE 15 MG/ML IJ SOLN
15.0000 mg | Freq: Once | INTRAMUSCULAR | Status: AC
  Administered 2023-04-23: 15 mg via INTRAMUSCULAR

## 2023-04-23 MED ORDER — NIRMATRELVIR/RITONAVIR (PAXLOVID)TABLET: 3.0000 | ORAL_TABLET | Freq: Two times a day (BID) | ORAL | 0 refills | Status: AC

## 2023-04-23 NOTE — ED Triage Notes (Signed)
Patient with c/o cough, congestion and body aches since yesterday. Patient states she has fibromyalgia, but the aches are worse now.

## 2023-04-23 NOTE — Discharge Instructions (Signed)
  You have been swabbed for COVID, and the test will result in the next 24 hours. Our staff will call you if positive. If the COVID test is positive, you should quarantine until you are fever free for 24 hours and you are starting to feel better, and then take added precautions for the next 5 days, such as physical distancing/wearing a mask and good hand hygiene/washing.  You have been given a shot of Toradol 15 mg today.  If your COVID swab test is positive, please fill the printed prescription for Paxlovid-- Take nirmatrelvir 150 mg --2 tablets twice daily for 5 days, plus also ritonavir 100 mg--1 tablet twice daily for 5 days.  These are antiviral medicines, meant to keep you from getting worse with a COVID-19 infection

## 2023-04-23 NOTE — ED Provider Notes (Signed)
[facility]    CSN: [id number] Arrival date & time: 04/23/23  6295      History   Chief Complaint Chief Complaint  Patient presents with   Cough   Generalized Body Aches    HPI Lisa Good is a 72 y.o. female.    Cough Here for cough and congestion and increased myalgia. Symptoms began yesterday. Some subjective fever. No n/v/d. No shortness of breath.  Her husband began having similar symptoms a few days ago, and tested positive for COVID 3 days ago.    Past Medical History:  Diagnosis Date   Allergy    seasonal   Arthritis    Cataract    Cerebral aneurysm    Chronic kidney disease    Chronic pain syndrome    Complication of anesthesia    trouble waking up after back surgery   DDD (degenerative disc disease), lumbar    Depression    DVT (deep venous thrombosis) (HCC) 2002   Fibromyalgia    GERD (gastroesophageal reflux disease)    Heart murmur    Heart dr said normal for me   History of kidney stones    Hyperlipidemia    Hypertension    Hypothyroidism    Incontinence in female    Iron deficiency anemia 11/01/2019   Irregular heart beat    Neuromuscular disorder (HCC)    peripheral neuropathy/feet   Obesity    OSA on CPAP    Pancolitis (HCC)    Pre-diabetes    RLS (restless legs syndrome)    UTI (urinary tract infection) 11/2022    Patient Active Problem List   Diagnosis Date Noted   Battery end of life of spinal cord stimulator 02/01/2023   Malfunction of spinal cord stimulator (HCC) 02/01/2023   Anxiety and depression 01/21/2023   Chronic right shoulder pain 11/11/2020   Right rotator cuff tear arthropathy 11/11/2020   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   Chronic radicular lumbar pain 12/04/2019   Iron deficiency anemia 11/01/2019   Primary osteoarthritis of left knee 10/17/2019   Primary osteoarthritis of right shoulder 10/17/2019   Hx of cervical spine surgery 08/21/2019   Pancolitis (HCC) 07/20/2019   Kyphosis of  cervical region 07/14/2019   Cervical spondylosis with myelopathy 06/28/2019   Chronic low back pain 06/28/2019   Cervical spondylosis without myelopathy 11/09/2018   Foraminal stenosis of cervical region 11/09/2018   Cervical stenosis of spinal canal 11/09/2018   History of fusion of lumbar spine 11/09/2018   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   Postlaminectomy syndrome of lumbosacral region 11/09/2018   Chronic pain syndrome 11/09/2018   Severe obesity (BMI 35.0-39.9) with comorbidity (HCC) 08/18/2018   Primary osteoarthritis of right knee 02/17/2017   Incontinence in female 10/05/2016   Spondylolisthesis, lumbar region 05/05/2016   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   OSA (obstructive sleep apnea) 05/31/2015   Hypothyroidism 02/09/2014   Sleep disorder 02/09/2014   Asymptomatic hyperuricemia 08/29/2013   Polyarthritis 08/29/2013   Essential hypertension 06/28/2013   SUI (stress urinary incontinence, female) 06/20/2013   Insomnia 05/22/2013   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   Peripheral neuropathy 05/25/2012   Venous (peripheral) insufficiency 05/25/2012   Restless legs syndrome (RLS) 10/03/2009   Fibromyalgia 02/18/2009   Gastro-esophageal reflux disease with esophagitis 10/23/2008   GERD (gastroesophageal reflux disease) 10/23/2008   Pure hypercholesterolemia 10/23/2008    Past Surgical History:  Procedure Laterality Date   BRAIN SURGERY  2002   aneurysm   CEREBRAL  ANEURYSM REPAIR  2002   COLONOSCOPY WITH PROPOFOL N/A 11/10/2019   Procedure: COLONOSCOPY WITH PROPOFOL;  Surgeon: Wyline Mood, MD;  Location: [facility];  Service: Endoscopy;  Laterality: N/A;   COSMETIC SURGERY     ESOPHAGOGASTRODUODENOSCOPY (EGD) WITH PROPOFOL N/A 11/10/2019   Procedure: ESOPHAGOGASTRODUODENOSCOPY (EGD) WITH PROPOFOL;  Surgeon: Wyline Mood, MD;  Location: [facility];  Service: Endoscopy;  Laterality: N/A;   EXTRACORPOREAL SHOCK WAVE  LITHOTRIPSY Right 04/27/2019   Procedure: EXTRACORPOREAL SHOCK WAVE LITHOTRIPSY (ESWL);  Surgeon: Sondra Come, MD;  Location: [facility];  Service: Urology;  Laterality: Right;   EYE SURGERY     JOINT REPLACEMENT Bilateral    knee   KNEE ARTHROPLASTY Left 01/12/2020   Procedure: COMPUTER ASSISTED TOTAL KNEE ARTHROPLASTY;  Surgeon: Donato Heinz, MD;  Location: [facility];  Service: Orthopedics;  Laterality: Left;   POLYPECTOMY  11/10/2019   Procedure: POLYPECTOMY;  Surgeon: Wyline Mood, MD;  Location: [facility];  Service: Endoscopy;;   REVERSE SHOULDER ARTHROPLASTY Right 01/06/2022   Procedure: REVERSE SHOULDER ARTHROPLASTY;  Surgeon: Christena Flake, MD;  Location: [facility];  Service: Orthopedics;  Laterality: Right;   SPINAL CORD STIMULATOR BATTERY EXCHANGE N/A 02/01/2023   Procedure: INTERNAL PULSE GENERATOR REPLACEMENT ([employer]);  Surgeon: Venetia Night, MD;  Location: [facility];  Service: Neurosurgery;  Laterality: N/A;  LOCAL WITH MAC   SPINAL FUSION  07/10/2019   C4-5 corpectomy with C6-7 ACDF, C2-T3 spinal fusion   SPINE SURGERY     spinal fusion   THORACIC LAMINECTOMY FOR SPINAL CORD STIMULATOR N/A 12/25/2022   Procedure: THORACIC LAMINECTOMY FOR SPINAL CORD STIMULATOR IMPLANTATION ([employer]);  Surgeon: Venetia Night, MD;  Location: [facility];  Service: Neurosurgery;  Laterality: N/A;   TUBAL LIGATION      OB History   No obstetric history on file.      Home Medications    Prior to Admission medications   Medication Sig Start Date End Date Taking? Authorizing Provider  nirmatrelvir/ritonavir (PAXLOVID) 20 x 150 MG & 10 x 100MG  TABS Take 3 tablets by mouth 2 (two) times daily for 5 days. Patient GFR is >60. Take nirmatrelvir (150 mg) two tablets twice daily for 5 days and ritonavir (100 mg) one tablet twice daily for 5 days. 04/23/23 04/28/23 Yes Zenia Resides, MD  Alpha-Lipoic Acid 600 MG CAPS Take 600 mg by mouth 2 (two) times daily.     [provider]  amLODipine (NORVASC) 10 MG tablet Take 10 mg by mouth at bedtime.    [provider]  gabapentin (NEURONTIN) 800 MG tablet Take 1 tablet (800 mg total) by mouth 4 (four) times daily. Patient taking differently: Take 800 mg by mouth 3 (three) times daily. 1 in am, 1 at lunch and 2 at bedtime 01/09/21   Edward Jolly, MD  ipratropium (ATROVENT) 0.06 % nasal spray Place 2 sprays into both nostrils 3 (three) times daily. As needed for nasal congestion, runny nose 10/03/22   Theadora Rama Scales, PA-C  lisinopril (PRINIVIL,ZESTRIL) 40 MG tablet Take 40 mg by mouth at bedtime.    [provider]  methocarbamol (ROBAXIN) 500 MG tablet Take 1 tablet (500 mg total) by mouth 4 (four) times daily. 02/21/23   Venetia Night, MD  oxyCODONE-acetaminophen (PERCOCET) 10-325 MG tablet Take 1 tablet by mouth 4 (four) times daily. 11/30/21   [provider]  pantoprazole (PROTONIX) 40 MG tablet Take 40 mg by mouth every morning. 11/17/21   [provider]  traZODone (DESYREL) 150 MG tablet Take 1 tablet (150 mg total) by mouth at bedtime. 01/20/23   McElwee, Lauren A, NP  venlafaxine XR (EFFEXOR-XR) 150 MG 24 hr capsule Take 150 mg by mouth at bedtime. Take with 75 mg to equal 225 mg daily 10/26/19   [provider]  venlafaxine XR (EFFEXOR-XR) 75 MG 24 hr capsule Take 75 mg by mouth at bedtime. Take with 150 mg to equal 225 mg daily 10/16/21   [provider]  XYLITOL MT Use as directed 2 tablets in the mouth or throat daily as needed (dry mouth).    [provider]    Family History Family History  Problem Relation Age of Onset   Hypertension Mother    Depression Mother    Diabetes Mother    Varicose Veins Mother    Hyperlipidemia Sister    Hypertension Sister    Depression Sister    Varicose Veins Sister    Depression Brother    Arthritis Maternal Grandmother    Diabetes Maternal Grandmother    Sleep apnea Neg Hx      Social History Social History   Tobacco Use   Smoking status: Former    Current packs/day: 0.00    Average packs/day: 1.5 packs/day for 20.0 years (30.0 ttl pk-yrs)    Types: Cigarettes    Start date: 10/29/1984    Quit date: 10/29/2004    Years since quitting: 18.4    Passive exposure: Past   Smokeless tobacco: Never  Vaping Use   Vaping status: Never Used  Substance Use Topics   Alcohol use: Not Currently    Comment: Rare social drinking   Drug use: Never     Allergies   Contrast media [iodinated contrast media], Iodine, Duloxetine, Pregabalin, Clarithromycin, and Spironolactone-hctz   Review of Systems Review of Systems  Respiratory:  Positive for cough.      Physical Exam Triage Vital Signs ED Triage Vitals  Encounter Vitals Group     BP 04/23/23 1000 (!) 142/82     Systolic BP Percentile --      Diastolic BP Percentile --      Pulse Rate 04/23/23 1000 (!) 108     Resp 04/23/23 1000 18     Temp 04/23/23 1000 99.8 F (37.7 C)     Temp Source 04/23/23 1000 Oral     SpO2 04/23/23 1000 92 %     Weight --      Height --      Head Circumference --      Peak Flow --      Pain Score 04/23/23 1001 9     Pain Loc --      Pain Education --      Exclude from Growth Chart --    No data found.  Updated Vital Signs BP (!) 142/82 (BP Location: Left Arm)   Pulse (!) 108   Temp 99.8 F (37.7 C) (Oral)   Resp 18   SpO2 92%   Visual Acuity Right Eye Distance:   Left Eye Distance:   Bilateral Distance:    Right Eye Near:   Left Eye Near:    Bilateral Near:     Physical Exam Vitals reviewed.  Constitutional:      General: She is not in acute distress.    Appearance: She is not ill-appearing, toxic-appearing or diaphoretic.  HENT:     Right Ear: Tympanic membrane and ear canal normal.     Left Ear: Tympanic  membrane and ear canal normal.     Nose: Congestion present.     Mouth/Throat:     Mouth: Mucous membranes are moist.     Comments: No erythema  or tonsillar hypertrophy Eyes:     Extraocular Movements: Extraocular movements intact.     Conjunctiva/sclera: Conjunctivae normal.     Pupils: Pupils are equal, round, and reactive to light.  Cardiovascular:     Rate and Rhythm: Normal rate and regular rhythm.     Heart sounds: No murmur heard. Pulmonary:     Effort: Pulmonary effort is normal. No respiratory distress.     Breath sounds: No stridor. No wheezing, rhonchi or rales.  Musculoskeletal:     Cervical back: Neck supple.  Lymphadenopathy:     Cervical: No cervical adenopathy.  Skin:    Capillary Refill: Capillary refill takes less than 2 seconds.     Coloration: Skin is not jaundiced or pale.  Neurological:     General: No focal deficit present.     Mental Status: She is alert and oriented to person, place, and time.  Psychiatric:        Behavior: Behavior normal.      UC Treatments / Results  Labs (all labs ordered are listed, but only abnormal results are displayed) Labs Reviewed  SARS CORONAVIRUS 2 (TAT 6-24 HRS)    EKG   Radiology No results found.  Procedures Procedures (including critical care time)  Medications Ordered in UC Medications  ketorolac (TORADOL) 15 MG/ML injection 15 mg (has no administration in time range)    Initial Impression / Assessment and Plan / UC Course  I have reviewed the triage vital signs and the nursing notes.  Pertinent labs & imaging results that were available during my care of the patient were reviewed by me and considered in my medical decision making (see chart for details).     Last eGFR in May 2024 was >60.  She states she has not been taking her statin in a long time. I have removed it from her list today.  She is swabbed for COVID and we will notify her if positive. BUT, tomorrow is Saturday, and we do not have a callback nurse on the weekends. Pt has mychart. If she sees a positive result, she will fill the printed rx for paxlovid I have provided. We  discussed reasons to take it, and to begin it prior to 5 days' duration of symptoms  Toradol injection given here for the myalgia Final Clinical Impressions(s) / UC Diagnoses   Final diagnoses:  Viral URI  Exposure to COVID-19 virus     Discharge Instructions       You have been swabbed for COVID, and the test will result in the next 24 hours. Our staff will call you if positive. If the COVID test is positive, you should quarantine until you are fever free for 24 hours and you are starting to feel better, and then take added precautions for the next 5 days, such as physical distancing/wearing a mask and good hand hygiene/washing.  You have been given a shot of Toradol 15 mg today.  If your COVID swab test is positive, please fill the printed prescription for Paxlovid-- Take nirmatrelvir 150 mg --2 tablets twice daily for 5 days, plus also ritonavir 100 mg--1 tablet twice daily for 5 days.  These are antiviral medicines, meant to keep you from getting worse with a COVID-19 infection  ED Prescriptions     Medication Sig Dispense Auth. Provider   nirmatrelvir/ritonavir (PAXLOVID) 20 x 150 MG & 10 x 100MG  TABS Take 3 tablets by mouth 2 (two) times daily for 5 days. Patient GFR is >60. Take nirmatrelvir (150 mg) two tablets twice daily for 5 days and ritonavir (100 mg) one tablet twice daily for 5 days. 30 tablet Maahir Horst, Janace Aris, MD      I have reviewed the PDMP during this encounter.   Zenia Resides, MD 04/23/23 1023

## 2023-04-24 ENCOUNTER — Encounter: Payer: Self-pay | Admitting: Neurosurgery

## 2023-04-24 DIAGNOSIS — G894 Chronic pain syndrome: Secondary | ICD-10-CM

## 2023-04-24 LAB — SARS CORONAVIRUS 2 (TAT 6-24 HRS): SARS Coronavirus 2: POSITIVE — AB

## 2023-04-26 ENCOUNTER — Ambulatory Visit: Payer: Medicare Other | Admitting: Urology

## 2023-04-26 NOTE — Telephone Encounter (Signed)
Patient sent 2 different MyChart messages in regards to the Pain Clinic referral. Please refer to the MyChart message from 04/24/23.

## 2023-04-27 ENCOUNTER — Ambulatory Visit: Payer: Medicare Other

## 2023-04-27 ENCOUNTER — Ambulatory Visit
Admission: EM | Admit: 2023-04-27 | Discharge: 2023-04-27 | Disposition: A | Discharge status: Home / Self Care | Payer: Medicare Other | Attending: Internal Medicine | Admitting: Internal Medicine

## 2023-04-27 DIAGNOSIS — R0602 Shortness of breath: Secondary | ICD-10-CM

## 2023-04-27 DIAGNOSIS — U071 COVID-19: Secondary | ICD-10-CM

## 2023-04-27 MED ORDER — ALBUTEROL SULFATE HFA 108 (90 BASE) MCG/ACT IN AERS: 1.0000 | INHALATION_SPRAY | Freq: Four times a day (QID) | RESPIRATORY_TRACT | 0 refills | Status: DC | PRN

## 2023-04-27 MED ORDER — METHYLPREDNISOLONE 4 MG PO TBPK: ORAL_TABLET | ORAL | 0 refills | Status: DC

## 2023-04-27 MED ORDER — BENZONATATE 100 MG PO CAPS: 100.0000 mg | ORAL_CAPSULE | Freq: Three times a day (TID) | ORAL | 0 refills | Status: DC | PRN

## 2023-04-27 NOTE — ED Provider Notes (Addendum)
[facility]    CSN: [id number] Arrival date & time: 04/27/23  0913      History   Chief Complaint Chief Complaint  Patient presents with   Cough    HPI Lisa Good is a 72 y.o. female.   Patient presents with cough that has been present for about 5 days.  States that she has also has some upper respiratory nasal congestion.  Cough is dry.  She denies any fevers.  Her husband has similar symptoms.  Denies chest pain but does report some intermittent shortness of breath.  Denies history of asthma or COPD and patient does not smoke cigarettes.  Patient was prescribed Paxlovid at recent urgent care visit on 7/19 where she tested positive for COVID-19.  Reports that she has only taken about 1 days worth of Paxlovid with no improvement.  States that symptoms seem to be worsening.  Blood pressure is elevated but patient reports that she has not taken her blood pressure medication today.   Cough   Past Medical History:  Diagnosis Date   Allergy    seasonal   Arthritis    Cataract    Cerebral aneurysm    Chronic kidney disease    Chronic pain syndrome    Complication of anesthesia    trouble waking up after back surgery   DDD (degenerative disc disease), lumbar    Depression    DVT (deep venous thrombosis) (HCC) 2002   Fibromyalgia    GERD (gastroesophageal reflux disease)    Heart murmur    Heart dr said normal for me   History of kidney stones    Hyperlipidemia    Hypertension    Hypothyroidism    Incontinence in female    Iron deficiency anemia 11/01/2019   Irregular heart beat    Neuromuscular disorder (HCC)    peripheral neuropathy/feet   Obesity    OSA on CPAP    Pancolitis (HCC)    Pre-diabetes    RLS (restless legs syndrome)    UTI (urinary tract infection) 11/2022    Patient Active Problem List   Diagnosis Date Noted   Battery end of life of spinal cord stimulator 02/01/2023   Malfunction of spinal cord stimulator (HCC) 02/01/2023    Anxiety and depression 01/21/2023   Chronic right shoulder pain 11/11/2020   Right rotator cuff tear arthropathy 11/11/2020   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   Chronic radicular lumbar pain 12/04/2019   Iron deficiency anemia 11/01/2019   Primary osteoarthritis of left knee 10/17/2019   Primary osteoarthritis of right shoulder 10/17/2019   Hx of cervical spine surgery 08/21/2019   Pancolitis (HCC) 07/20/2019   Kyphosis of cervical region 07/14/2019   Cervical spondylosis with myelopathy 06/28/2019   Chronic low back pain 06/28/2019   Cervical spondylosis without myelopathy 11/09/2018   Foraminal stenosis of cervical region 11/09/2018   Cervical stenosis of spinal canal 11/09/2018   History of fusion of lumbar spine 11/09/2018   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   Postlaminectomy syndrome of lumbosacral region 11/09/2018   Chronic pain syndrome 11/09/2018   Severe obesity (BMI 35.0-39.9) with comorbidity (HCC) 08/18/2018   Primary osteoarthritis of right knee 02/17/2017   Incontinence in female 10/05/2016   Spondylolisthesis, lumbar region 05/05/2016   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   OSA (obstructive sleep apnea) 05/31/2015   Hypothyroidism 02/09/2014   Sleep disorder 02/09/2014   Asymptomatic hyperuricemia 08/29/2013   Polyarthritis 08/29/2013   Essential hypertension 06/28/2013   SUI (  stress urinary incontinence, female) 06/20/2013   Insomnia 05/22/2013   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   Peripheral neuropathy 05/25/2012   Venous (peripheral) insufficiency 05/25/2012   Restless legs syndrome (RLS) 10/03/2009   Fibromyalgia 02/18/2009   Gastro-esophageal reflux disease with esophagitis 10/23/2008   GERD (gastroesophageal reflux disease) 10/23/2008   Pure hypercholesterolemia 10/23/2008    Past Surgical History:  Procedure Laterality Date   BRAIN SURGERY  2002   aneurysm   CEREBRAL ANEURYSM REPAIR  2002   COLONOSCOPY WITH  PROPOFOL N/A 11/10/2019   Procedure: COLONOSCOPY WITH PROPOFOL;  Surgeon: Wyline Mood, MD;  Location: [facility];  Service: Endoscopy;  Laterality: N/A;   COSMETIC SURGERY     ESOPHAGOGASTRODUODENOSCOPY (EGD) WITH PROPOFOL N/A 11/10/2019   Procedure: ESOPHAGOGASTRODUODENOSCOPY (EGD) WITH PROPOFOL;  Surgeon: Wyline Mood, MD;  Location: [facility];  Service: Endoscopy;  Laterality: N/A;   EXTRACORPOREAL SHOCK WAVE LITHOTRIPSY Right 04/27/2019   Procedure: EXTRACORPOREAL SHOCK WAVE LITHOTRIPSY (ESWL);  Surgeon: Sondra Come, MD;  Location: [facility];  Service: Urology;  Laterality: Right;   EYE SURGERY     JOINT REPLACEMENT Bilateral    knee   KNEE ARTHROPLASTY Left 01/12/2020   Procedure: COMPUTER ASSISTED TOTAL KNEE ARTHROPLASTY;  Surgeon: Donato Heinz, MD;  Location: [facility];  Service: Orthopedics;  Laterality: Left;   POLYPECTOMY  11/10/2019   Procedure: POLYPECTOMY;  Surgeon: Wyline Mood, MD;  Location: [facility];  Service: Endoscopy;;   REVERSE SHOULDER ARTHROPLASTY Right 01/06/2022   Procedure: REVERSE SHOULDER ARTHROPLASTY;  Surgeon: Christena Flake, MD;  Location: [facility];  Service: Orthopedics;  Laterality: Right;   SPINAL CORD STIMULATOR BATTERY EXCHANGE N/A 02/01/2023   Procedure: INTERNAL PULSE GENERATOR REPLACEMENT ([employer]);  Surgeon: Venetia Night, MD;  Location: [facility];  Service: Neurosurgery;  Laterality: N/A;  LOCAL WITH MAC   SPINAL FUSION  07/10/2019   C4-5 corpectomy with C6-7 ACDF, C2-T3 spinal fusion   SPINE SURGERY     spinal fusion   THORACIC LAMINECTOMY FOR SPINAL CORD STIMULATOR N/A 12/25/2022   Procedure: THORACIC LAMINECTOMY FOR SPINAL CORD STIMULATOR IMPLANTATION ([employer]);  Surgeon: Venetia Night, MD;  Location: [facility];  Service: Neurosurgery;  Laterality: N/A;   TUBAL LIGATION      OB History   No obstetric history on file.      Home Medications    Prior to Admission medications    Medication Sig Start Date End Date Taking? Authorizing Provider  albuterol (VENTOLIN HFA) 108 (90 Base) MCG/ACT inhaler Inhale 1-2 puffs into the lungs every 6 (six) hours as needed for wheezing or shortness of breath. 04/27/23  Yes , [place] E, FNP  benzonatate (TESSALON) 100 MG capsule Take 1 capsule (100 mg total) by mouth every 8 (eight) hours as needed for cough. 04/27/23  Yes , Rolly Salter E, FNP  methylPREDNISolone (MEDROL DOSEPAK) 4 MG TBPK tablet Take per package instructions. 04/27/23  Yes , Acie Fredrickson, FNP  nirmatrelvir/ritonavir (PAXLOVID) 20 x 150 MG & 10 x 100MG  TABS Take 3 tablets by mouth 2 (two) times daily for 5 days. Patient GFR is >60. Take nirmatrelvir (150 mg) two tablets twice daily for 5 days and ritonavir (100 mg) one tablet twice daily for 5 days. 04/23/23 04/28/23 Yes Zenia Resides, MD  Alpha-Lipoic Acid 600 MG CAPS Take 600 mg by mouth 2 (two) times daily.    [provider]  amLODipine (NORVASC) 10 MG tablet Take 10 mg by mouth at bedtime.    [provider]  gabapentin (NEURONTIN) 800 MG tablet Take 1 tablet (800 mg total) by mouth 4 (four) times daily. Patient taking differently: Take 800 mg by mouth 3 (three) times daily. 1 in am, 1 at lunch and 2 at bedtime 01/09/21   Edward Jolly, MD  ipratropium (ATROVENT) 0.06 % nasal spray Place 2 sprays into both nostrils 3 (three) times daily. As needed for nasal congestion, runny nose 10/03/22   Theadora Rama Scales, PA-C  lisinopril (PRINIVIL,ZESTRIL) 40 MG tablet Take 40 mg by mouth at bedtime.    [provider]  methocarbamol (ROBAXIN) 500 MG tablet Take 1 tablet (500 mg total) by mouth 4 (four) times daily. 02/21/23   Venetia Night, MD  oxyCODONE-acetaminophen (PERCOCET) 10-325 MG tablet Take 1 tablet by mouth 4 (four) times daily. 11/30/21   [provider]  pantoprazole (PROTONIX) 40 MG tablet Take 40 mg by mouth every morning. 11/17/21   [provider]  traZODone  (DESYREL) 150 MG tablet Take 1 tablet (150 mg total) by mouth at bedtime. 01/20/23   McElwee, Lauren A, NP  venlafaxine XR (EFFEXOR-XR) 150 MG 24 hr capsule Take 150 mg by mouth at bedtime. Take with 75 mg to equal 225 mg daily 10/26/19   [provider]  venlafaxine XR (EFFEXOR-XR) 75 MG 24 hr capsule Take 75 mg by mouth at bedtime. Take with 150 mg to equal 225 mg daily 10/16/21   [provider]  XYLITOL MT Use as directed 2 tablets in the mouth or throat daily as needed (dry mouth).    [provider]    Family History Family History  Problem Relation Age of Onset   Hypertension Mother    Depression Mother    Diabetes Mother    Varicose Veins Mother    Hyperlipidemia Sister    Hypertension Sister    Depression Sister    Varicose Veins Sister    Depression Brother    Arthritis Maternal Grandmother    Diabetes Maternal Grandmother    Sleep apnea Neg Hx     Social History Social History   Tobacco Use   Smoking status: Former    Current packs/day: 0.00    Average packs/day: 1.5 packs/day for 20.0 years (30.0 ttl pk-yrs)    Types: Cigarettes    Start date: 10/29/1984    Quit date: 10/29/2004    Years since quitting: 18.5    Passive exposure: Past   Smokeless tobacco: Never  Vaping Use   Vaping status: Never Used  Substance Use Topics   Alcohol use: Not Currently    Comment: Rare social drinking   Drug use: Never     Allergies   Contrast media [iodinated contrast media], Iodine, Duloxetine, Pregabalin, Clarithromycin, and Spironolactone-hctz   Review of Systems Review of Systems Per HPI  Physical Exam Triage Vital Signs ED Triage Vitals  Encounter Vitals Group     BP 04/27/23 1001 (!) 194/84     Systolic BP Percentile --      Diastolic BP Percentile --      Pulse Rate 04/27/23 1001 (!) 104     Resp 04/27/23 1001 18     Temp 04/27/23 1001 98.6 F (37 C)     Temp Source 04/27/23 1001 Oral     SpO2 04/27/23 1001 93 %     Weight  04/27/23 1001 195 lb (88.5 kg)     Height 04/27/23 1001 5\' 5"  (1.651 m)     Head Circumference --  Peak Flow --      Pain Score 04/27/23 1000 0     Pain Loc --      Pain Education --      Exclude from Growth Chart --    No data found.  Updated Vital Signs BP (!) 175/98 (BP Location: Left Arm)   Pulse (!) 104   Temp 98.6 F (37 C) (Oral)   Resp 18   Ht 5\' 5"  (1.651 m)   Wt 195 lb (88.5 kg)   SpO2 97%   BMI 32.45 kg/m   Visual Acuity Right Eye Distance:   Left Eye Distance:   Bilateral Distance:    Right Eye Near:   Left Eye Near:    Bilateral Near:     Physical Exam Constitutional:      General: She is not in acute distress.    Appearance: Normal appearance. She is not toxic-appearing or diaphoretic.  HENT:     Head: Normocephalic and atraumatic.     Right Ear: Tympanic membrane and ear canal normal.     Left Ear: Tympanic membrane and ear canal normal.     Nose: Congestion present.     Mouth/Throat:     Mouth: Mucous membranes are moist.     Pharynx: No posterior oropharyngeal erythema.  Eyes:     Extraocular Movements: Extraocular movements intact.     Conjunctiva/sclera: Conjunctivae normal.     Pupils: Pupils are equal, round, and reactive to light.  Cardiovascular:     Rate and Rhythm: Normal rate and regular rhythm.     Pulses: Normal pulses.     Heart sounds: Normal heart sounds.  Pulmonary:     Effort: Pulmonary effort is normal. No respiratory distress.     Breath sounds: Normal breath sounds. No stridor. No wheezing, rhonchi or rales.  Abdominal:     General: Abdomen is flat. Bowel sounds are normal.     Palpations: Abdomen is soft.  Musculoskeletal:        General: Normal range of motion.     Cervical back: Normal range of motion.  Skin:    General: Skin is warm and dry.  Neurological:     General: No focal deficit present.     Mental Status: She is alert and oriented to person, place, and time. Mental status is at baseline.   Psychiatric:        Mood and Affect: Mood normal.        Behavior: Behavior normal.        Thought Content: Thought content normal.        Judgment: Judgment normal.      UC Treatments / Results  Labs (all labs ordered are listed, but only abnormal results are displayed) Labs Reviewed - No data to display  EKG   Radiology DG Chest 2 View  Result Date: 04/27/2023 CLINICAL DATA:  Cough. EXAM: CHEST - 2 VIEW COMPARISON:  March 13, 2022. FINDINGS: The heart size and mediastinal contours are within normal limits. Right lung is clear. Stable left midlung subsegmental atelectasis or scarring is noted. Status post right shoulder arthroplasty. Postsurgical changes are seen involving cervical and lower thoracic spine. IMPRESSION: Stable minimal left midlung subsegmental atelectasis or scarring. No acute abnormality is noted. Electronically Signed   By: Lupita Raider M.D.   On: 04/27/2023 11:31    Procedures Procedures (including critical care time)  Medications Ordered in UC Medications - No data to display  Initial Impression / Assessment and Plan /  UC Course  I have reviewed the triage vital signs and the nursing notes.  Pertinent labs & imaging results that were available during my care of the patient were reviewed by me and considered in my medical decision making (see chart for details).     Patient currently has COVID-19.  Encouraged her to continue Paxlovid.  Oxygen was normal and maintaining in the high 90s and patient is not tachypneic so do not think that emergent evaluation is necessary.  Chest x-ray was completed that showed no acute cardiopulmonary process.  I do think patient would benefit from steroid therapy given symptoms are worsening per patient report and she is having intermittent shortness of breath.  She reports that she has taken steroids previously.  I was given an alert on her chart that there may be a cross interaction between steroids and her allergic reaction  to the spironolactone HCTZ.  Called pharmacy to discuss this who reports that Medrol Dosepak should be safe.  Patient states that she may or may not take the steroid therapy.  Encouraged her that this will be helpful and to take with food to avoid stomach upset. Called pharmacy to confirm dosage of medrol dosepak.  Albuterol inhaler also prescribed to take as needed for shortness of breath.  Benzonatate prescribed to take as needed for cough.  Paxlovid may increase the concentration of steroid therapy but I do think the benefits outweigh risks at this time.  Blood pressure is elevated but she reports that she has not taken her blood pressure medication today so encouraged her to take blood pressure medication as soon as possible.  Patient seems asymptomatic regarding blood pressure so no need for emergent evaluation.  Recheck was slightly improved.  Advised patient of strict follow-up and ER precautions.  Patient verbalized understanding and was agreeable with plan. Final Clinical Impressions(s) / UC Diagnoses   Final diagnoses:  COVID-19  Shortness of breath   Discharge Instructions   None    ED Prescriptions     Medication Sig Dispense Auth. Provider   albuterol (VENTOLIN HFA) 108 (90 Base) MCG/ACT inhaler Inhale 1-2 puffs into the lungs every 6 (six) hours as needed for wheezing or shortness of breath. 1 each [place], Rolly Salter E, [place]   methylPREDNISolone (MEDROL DOSEPAK) 4 MG TBPK tablet Take per package instructions. 1 each [place], [place] E, [place]   benzonatate (TESSALON) 100 MG capsule Take 1 capsule (100 mg total) by mouth every 8 (eight) hours as needed for cough. 21 capsule [place], Acie Fredrickson, [place]      PDMP not reviewed this encounter.   Gustavus Bryant, [place] 04/27/23 [address], [place] 04/27/23 1153    [address] [place] 04/27/23 1154    Gustavus Bryant, [place] 04/27/23 1154

## 2023-04-27 NOTE — ED Triage Notes (Signed)
Patient here today with c/o cough X 5 days. Patient was here recently and states that her cough has worsened. Tested positive for Covid.

## 2023-04-29 ENCOUNTER — Inpatient Hospital Stay: Payer: Medicare Other | Admitting: Medical Oncology

## 2023-04-29 ENCOUNTER — Inpatient Hospital Stay: Payer: Medicare Other

## 2023-05-13 ENCOUNTER — Other Ambulatory Visit: Payer: Self-pay | Admitting: Family Medicine

## 2023-05-13 ENCOUNTER — Inpatient Hospital Stay: Payer: Medicare Other | Admitting: Medical Oncology

## 2023-05-13 ENCOUNTER — Encounter: Payer: Self-pay | Admitting: Physical Medicine & Rehabilitation

## 2023-05-13 ENCOUNTER — Encounter: Payer: Self-pay | Admitting: Medical Oncology

## 2023-05-13 ENCOUNTER — Other Ambulatory Visit: Payer: Self-pay | Admitting: Neurosurgery

## 2023-05-13 ENCOUNTER — Other Ambulatory Visit: Payer: Self-pay

## 2023-05-13 ENCOUNTER — Inpatient Hospital Stay: Payer: Medicare Other

## 2023-05-13 VITALS — BP 118/57 | HR 96 | Temp 98.9°F | Resp 19 | Ht 65.0 in | Wt 195.0 lb

## 2023-05-13 DIAGNOSIS — D500 Iron deficiency anemia secondary to blood loss (chronic): Secondary | ICD-10-CM | POA: Insufficient documentation

## 2023-05-13 DIAGNOSIS — D649 Anemia, unspecified: Secondary | ICD-10-CM

## 2023-05-13 DIAGNOSIS — K922 Gastrointestinal hemorrhage, unspecified: Secondary | ICD-10-CM | POA: Diagnosis not present

## 2023-05-13 DIAGNOSIS — Z87891 Personal history of nicotine dependence: Secondary | ICD-10-CM | POA: Diagnosis not present

## 2023-05-13 DIAGNOSIS — G894 Chronic pain syndrome: Secondary | ICD-10-CM

## 2023-05-13 LAB — CMP (CANCER CENTER ONLY)
ALT: 6 U/L (ref 0–44)
AST: 11 U/L — ABNORMAL LOW (ref 15–41)
Albumin: 3.9 g/dL (ref 3.5–5.0)
Alkaline Phosphatase: 86 U/L (ref 38–126)
Anion gap: 8 (ref 5–15)
BUN: 22 mg/dL (ref 8–23)
CO2: 30 mmol/L (ref 22–32)
Calcium: 9.5 mg/dL (ref 8.9–10.3)
Chloride: 107 mmol/L (ref 98–111)
Creatinine: 1.03 mg/dL — ABNORMAL HIGH (ref 0.44–1.00)
GFR, Estimated: 58 mL/min — ABNORMAL LOW (ref >60)
Glucose, Bld: 111 mg/dL — ABNORMAL HIGH (ref 70–99)
Potassium: 4.2 mmol/L (ref 3.5–5.1)
Sodium: 145 mmol/L (ref 135–145)
Total Bilirubin: 0.2 mg/dL — ABNORMAL LOW (ref 0.3–1.2)
Total Protein: 6.6 g/dL (ref 6.5–8.1)

## 2023-05-13 LAB — IRON AND IRON BINDING CAPACITY (CC-WL,HP ONLY)
Iron: 55 ug/dL (ref 28–170)
Saturation Ratios: 14 % (ref 10.4–31.8)
TIBC: 400 ug/dL (ref 250–450)
UIBC: 345 ug/dL (ref 148–442)

## 2023-05-13 LAB — CBC WITH DIFFERENTIAL (CANCER CENTER ONLY)
Abs Immature Granulocytes: 0.02 10*3/uL (ref 0.00–0.07)
Basophils Absolute: 0.1 10*3/uL (ref 0.0–0.1)
Basophils Relative: 1 %
Eosinophils Absolute: 0.2 10*3/uL (ref 0.0–0.5)
Eosinophils Relative: 3 %
HCT: 27.8 % — ABNORMAL LOW (ref 36.0–46.0)
Hemoglobin: 7.6 g/dL — ABNORMAL LOW (ref 12.0–15.0)
Immature Granulocytes: 0 %
Lymphocytes Relative: 23 %
Lymphs Abs: 1.3 10*3/uL (ref 0.7–4.0)
MCH: 21.9 pg — ABNORMAL LOW (ref 26.0–34.0)
MCHC: 27.3 g/dL — ABNORMAL LOW (ref 30.0–36.0)
MCV: 80.1 fL (ref 80.0–100.0)
Monocytes Absolute: 0.4 10*3/uL (ref 0.1–1.0)
Monocytes Relative: 7 %
Neutro Abs: 3.8 10*3/uL (ref 1.7–7.7)
Neutrophils Relative %: 66 %
Platelet Count: 245 10*3/uL (ref 150–400)
RBC: 3.47 MIL/uL — ABNORMAL LOW (ref 3.87–5.11)
RDW: 17.8 % — ABNORMAL HIGH (ref 11.5–15.5)
WBC Count: 5.8 10*3/uL (ref 4.0–10.5)
nRBC: 0 % (ref 0.0–0.2)

## 2023-05-13 LAB — FOLATE: Folate: 12.2 ng/mL (ref >5.9)

## 2023-05-13 LAB — VITAMIN B12: Vitamin B-12: 268 pg/mL (ref 180–914)

## 2023-05-13 LAB — SAMPLE TO BLOOD BANK

## 2023-05-13 LAB — FERRITIN: Ferritin: 6 ng/mL — ABNORMAL LOW (ref 11–307)

## 2023-05-13 NOTE — Progress Notes (Signed)
[facility] [phone]   [phone]  INITIAL CONSULT NOTE  Patient Care Team: Patrice Paradise, MD as PCP - General (Physician Assistant)  CHIEF COMPLAINTS/PURPOSE OF CONSULTATION:  "Iron deficiency "  HISTORY OF PRESENTING ILLNESS:  Lisa Good 72 y.o. female is referred to our office by their Gastroenterologist Dr. 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 She has a history of GI bleeding within the last year. She has an updated endoscopy/colonoscopy scheduled for 06/13/2023. She also has a history of CKD3.   Blood in stools:No Blood in urine: No Difficulty swallowing: No Pica: Yes SOB: Yes Fatigue: Yes Change of bowel movement/constipation:Yes Prior blood transfusion: No Prior history of blood loss: No other than the chronic bleeding Liver disease: No Bariatric surgery: No UC/Crohn's: No Frequent Blood Donation: No Vaginal bleeding: No Prior evaluation with hematology: No Prior bone marrow biopsy: No Oral iron: Occasional- Poor compliance Prior IV iron infusions: No Family history Anemia: No- no known sickle cell or thalassemia  No Personal or family history of bleeding or clotting disorders.  Not on any special diets: No Iron rich foods in diet: No No upcoming planned pregnancies and not currently pregnant: No  MEDICAL HISTORY:  Past Medical History:  Diagnosis Date   Allergy    seasonal   Arthritis    Cataract    Cerebral aneurysm    Chronic kidney disease    Chronic pain syndrome    Complication of anesthesia    trouble waking up after back surgery   DDD (degenerative disc disease), lumbar    Depression    DVT (deep venous thrombosis) (HCC) 2002   Fibromyalgia    GERD (gastroesophageal reflux disease)    Heart murmur    Heart dr said normal for me   History of kidney stones    Hyperlipidemia    Hypertension    Hypothyroidism    Incontinence in female    Iron deficiency anemia 11/01/2019   Irregular  heart beat    Neuromuscular disorder (HCC)    peripheral neuropathy/feet   Obesity    OSA on CPAP    Pancolitis (HCC)    Pre-diabetes    RLS (restless legs syndrome)    UTI (urinary tract infection) 11/2022    SURGICAL HISTORY: Past Surgical History:  Procedure Laterality Date   BRAIN SURGERY  2002   aneurysm   CEREBRAL ANEURYSM REPAIR  2002   COLONOSCOPY WITH PROPOFOL N/A 11/10/2019   Procedure: COLONOSCOPY WITH PROPOFOL;  Surgeon: Wyline Mood, MD;  Location: [facility];  Service: Endoscopy;  Laterality: N/A;   COSMETIC SURGERY     ESOPHAGOGASTRODUODENOSCOPY (EGD) WITH PROPOFOL N/A 11/10/2019   Procedure: ESOPHAGOGASTRODUODENOSCOPY (EGD) WITH PROPOFOL;  Surgeon: Wyline Mood, MD;  Location: [facility];  Service: Endoscopy;  Laterality: N/A;   EXTRACORPOREAL SHOCK WAVE LITHOTRIPSY Right 04/27/2019   Procedure: EXTRACORPOREAL SHOCK WAVE LITHOTRIPSY (ESWL);  Surgeon: Sondra Come, MD;  Location: [facility];  Service: Urology;  Laterality: Right;   EYE SURGERY     JOINT REPLACEMENT Bilateral    knee   KNEE ARTHROPLASTY Left 01/12/2020   Procedure: COMPUTER ASSISTED TOTAL KNEE ARTHROPLASTY;  Surgeon: Donato Heinz, MD;  Location: [facility];  Service: Orthopedics;  Laterality: Left;   POLYPECTOMY  11/10/2019   Procedure: POLYPECTOMY;  Surgeon: Wyline Mood, MD;  Location: [facility];  Service: Endoscopy;;   REVERSE SHOULDER ARTHROPLASTY Right 01/06/2022   Procedure: REVERSE SHOULDER ARTHROPLASTY;  Surgeon: Christena Flake, MD;  Location: [facility];  Service: Orthopedics;  Laterality: Right;   SPINAL CORD STIMULATOR BATTERY EXCHANGE N/A 02/01/2023   Procedure: INTERNAL PULSE GENERATOR REPLACEMENT ([employer]);  Surgeon: Venetia Night, MD;  Location: [facility];  Service: Neurosurgery;  Laterality: N/A;  LOCAL WITH MAC   SPINAL FUSION  07/10/2019   C4-5 corpectomy with C6-7 ACDF, C2-T3 spinal fusion   SPINE SURGERY     spinal fusion   THORACIC  LAMINECTOMY FOR SPINAL CORD STIMULATOR N/A 12/25/2022   Procedure: THORACIC LAMINECTOMY FOR SPINAL CORD STIMULATOR IMPLANTATION ([employer]);  Surgeon: Venetia Night, MD;  Location: [facility];  Service: Neurosurgery;  Laterality: N/A;   TUBAL LIGATION      SOCIAL HISTORY: Social History   Socioeconomic History   Marital status: Married    Spouse name: Not on file   Number of children: Not on file   Years of education: Not on file   Highest education level: Not on file  Occupational History   Occupation: retired   Tobacco Use   Smoking status: Former    Current packs/day: 0.00    Average packs/day: 1.5 packs/day for 20.0 years (30.0 ttl pk-yrs)    Types: Cigarettes    Start date: 10/29/1984    Quit date: 10/29/2004    Years since quitting: 18.5    Passive exposure: Past   Smokeless tobacco: Never  Vaping Use   Vaping status: Never Used  Substance and Sexual Activity   Alcohol use: Not Currently    Comment: Rare social drinking   Drug use: Never   Sexual activity: Not Currently    Birth control/protection: Surgical, Post-menopausal  Other Topics Concern   Not on file  Social History Narrative   Not on file   Social Determinants of Health   Financial Resource Strain: Low Risk  (03/30/2023)   Received from [facility], [facility]   Overall Financial Resource Strain (CARDIA)    Difficulty of Paying Living Expenses: Not hard at all  Food Insecurity: No Food Insecurity (03/30/2023)   Received from [facility], [facility]   Hunger Vital Sign    Worried About Running Out of Food in the Last Year: Never true    Ran Out of Food in the Last Year: Never true  Transportation Needs: No Transportation Needs (03/30/2023)   Received from [facility], [facility]   [facility] - Transportation    In the past 12 months, has lack of transportation kept you from medical  appointments or from getting medications?: No    Lack of Transportation (Non-Medical): No  Physical Activity: Inactive (11/24/2021)   Received from [facility], [facility]   Exercise Vital Sign    Days of Exercise per Week: 0 days    Minutes of Exercise per Session: 0 min  Stress: Stress Concern Present (11/24/2021)   Received from [facility], [facility] - Occupational Stress Questionnaire    Feeling of Stress : To some extent  Social Connections: Unknown (02/05/2022)   Received from [facility], [facility]   Social Network    Social Network: Not on file  Recent Concern: Social Connections - Moderately Isolated (11/24/2021)   Received from [facility], [facility]   Social Connection and Isolation Panel [NHANES]    Frequency of Communication with Friends and Family: Three times a week    Frequency of Social Gatherings with Friends and Family: Never  Attends Religious Services: Never    Active Member of Clubs or Organizations: No    Attends Banker Meetings: Never    Marital Status: Married  Catering manager Violence: Unknown (01/06/2022)   Received from [employer], [facility]   HITS    Physically Hurt: Not on file    Insult or Talk Down To: Not on file    Threaten Physical Harm: Not on file    Scream or Curse: Not on file    FAMILY HISTORY: Family History  Problem Relation Age of Onset   Hypertension Mother    Depression Mother    Diabetes Mother    Varicose Veins Mother    Hyperlipidemia Sister    Hypertension Sister    Depression Sister    Varicose Veins Sister    Depression Brother    Arthritis Maternal Grandmother    Diabetes Maternal Grandmother    Sleep apnea Neg Hx     ALLERGIES:  is allergic to contrast media [iodinated contrast media], duloxetine, pregabalin, iodine, clarithromycin, and spironolactone-hctz.  MEDICATIONS:  Current Outpatient Medications  Medication Sig Dispense  Refill   albuterol (VENTOLIN HFA) 108 (90 Base) MCG/ACT inhaler Inhale 1-2 puffs into the lungs every 6 (six) hours as needed for wheezing or shortness of breath. 1 each 0   Alpha-Lipoic Acid 600 MG CAPS Take 600 mg by mouth 2 (two) times daily.     amLODipine (NORVASC) 10 MG tablet Take 10 mg by mouth at bedtime.     gabapentin (NEURONTIN) 800 MG tablet Take 1 tablet (800 mg total) by mouth 4 (four) times daily. (Patient taking differently: Take 800 mg by mouth 3 (three) times daily. 1 in am, 1 at lunch and 2 at bedtime) 120 tablet 3   lisinopril (PRINIVIL,ZESTRIL) 40 MG tablet Take 40 mg by mouth at bedtime.     methocarbamol (ROBAXIN) 500 MG tablet Take 1 tablet (500 mg total) by mouth 4 (four) times daily. 120 tablet 0   oxyCODONE-acetaminophen (PERCOCET) 10-325 MG tablet Take 1 tablet by mouth 4 (four) times daily.     pantoprazole (PROTONIX) 40 MG tablet Take 40 mg by mouth every morning.     traZODone (DESYREL) 150 MG tablet Take 1 tablet (150 mg total) by mouth at bedtime. 90 tablet 0   venlafaxine XR (EFFEXOR-XR) 150 MG 24 hr capsule Take 150 mg by mouth at bedtime. Take with 75 mg to equal 225 mg daily     venlafaxine XR (EFFEXOR-XR) 75 MG 24 hr capsule Take 75 mg by mouth at bedtime. Take with 150 mg to equal 225 mg daily     XYLITOL MT Use as directed 2 tablets in the mouth or throat daily as needed (dry mouth).     ipratropium (ATROVENT) 0.06 % nasal spray Place 2 sprays into both nostrils 3 (three) times daily. As needed for nasal congestion, runny nose (Patient not taking: Reported on 05/13/2023) 15 mL 1   No current facility-administered medications for this visit.    REVIEW OF SYSTEMS:   Constitutional: ( - ) fevers, ( - )  chills , ( - ) night sweats Eyes: ( - ) blurriness of vision, ( - ) double vision, ( - ) watery eyes Ears, nose, mouth, throat, and face: ( - ) mucositis, ( - ) sore throat Respiratory: ( - ) cough, ( - ) dyspnea, ( - ) wheezes Cardiovascular: ( - )  palpitation, ( - ) chest discomfort, ( - ) lower extremity swelling  Gastrointestinal:  ( - ) nausea, ( - ) heartburn, ( - ) change in bowel habits Skin: ( - ) abnormal skin rashes Lymphatics: ( - ) new lymphadenopathy, ( - ) easy bruising Neurological: ( - ) numbness, ( - ) tingling, ( - ) new weaknesses Behavioral/Psych: ( - ) mood change, ( - ) new changes  All other systems were reviewed with the patient and are negative.  PHYSICAL EXAMINATION: ECOG PERFORMANCE STATUS: 1 - Symptomatic but completely ambulatory  Vitals:   05/13/23 1220  BP: (!) 118/57  Pulse: 96  Resp: 19  Temp: 98.9 F (37.2 C)  SpO2: 95%   Filed Weights   05/13/23 1220  Weight: 195 lb (88.5 kg)    GENERAL: well appearing female in NAD  SKIN: skin color is a bit pale, texture, turgor are normal, no rashes or significant lesions EYES: conjunctiva are pale and non-injected, sclera clear OROPHARYNX: no exudate, no erythema; lips, buccal mucosa, and tongue normal  NECK: supple, non-tender LYMPH:  no palpable lymphadenopathy in the cervical, axillary or supraclavicular lymph nodes.  LUNGS: clear to auscultation and percussion with normal breathing effort HEART: regular rate & rhythm and no murmurs and no lower extremity edema ABDOMEN: soft, non-tender, non-distended, no hepatosplenomegaly, normal bowel sounds Musculoskeletal: no cyanosis of digits and no clubbing  PSYCH: alert & oriented x 3, fluent speech NEURO: no focal motor/sensory deficits  LABORATORY DATA:  I have reviewed the data as listed    Latest Ref Rng & Units 02/20/2023    7:44 PM 01/01/2023    7:22 PM 12/07/2022   10:15 AM  CBC  WBC 4.0 - 10.5 K/uL 6.3  7.1  6.2   Hemoglobin 12.0 - 15.0 g/dL 69.6  9.6  29.5   Hematocrit 36.0 - 46.0 % 34.2  31.9  36.3   Platelets 150 - 400 K/uL 326  304  269        Latest Ref Rng & Units 02/20/2023    7:44 PM 01/01/2023    7:22 PM 03/31/2022    5:36 PM  CMP  Glucose 70 - 99 mg/dL 284  132  440   BUN 8 -  23 mg/dL 13  13  17    Creatinine 0.44 - 1.00 mg/dL 1.02  7.25  3.66   Sodium 135 - 145 mmol/L 139  135  141   Potassium 3.5 - 5.1 mmol/L 3.8  4.2  3.2   Chloride 98 - 111 mmol/L 101  100  102   CO2 22 - 32 mmol/L 26  26  26    Calcium 8.9 - 10.3 mg/dL 9.3  8.9  44.0   Total Protein 6.5 - 8.1 g/dL  6.3  7.4   Total Bilirubin 0.3 - 1.2 mg/dL  0.4  0.4   Alkaline Phos 38 - 126 U/L  90  99   AST 15 - 41 U/L  16  15   ALT 0 - 44 U/L  11  8     ASSESSMENT & PLAN Cash Bucy is a 72 y.o. female who was referred to [place] for IDA  The patient's IDA is new to [place] and is secondary to GI bleeding. Also has CKD3. Labs from 03/29/2023 show an iron of 19, iron saturation of 4%, ferritin of 7 on 03/18/2023. Last CBC on 03/30/2023 showed a hemoglobin of 8.2, MCV of 80.6. Our plan is to check labs today and schedule her for IV iron given her significant anemia and extremely low ferritin/iron levels.  Reviewed information regarding IV iron including common potential side effects and risks.   Labs today Venofer weekly x 3 weeks total Possible blood tomorrow-[place] RTC 2 weeks APP, labs(can link with Venofer appointment)-[place]   All questions were answered. The patient knows to call the clinic with any problems, questions or concerns.  I have spent a total of 40 minutes minutes of face-to-face and non-face-to-face time, preparing to see the patient, obtaining and/or reviewing separately obtained history, performing a medically appropriate examination, counseling and educating the patient, ordering medications/tests/procedures, referring and communicating with other health care professionals, documenting clinical information in the electronic health record, independently interpreting results and communicating results to the patient, and care coordination.    Clent Jacks PA-C Department of Hematology/Oncology [facility] at [facility]

## 2023-05-20 ENCOUNTER — Inpatient Hospital Stay: Payer: Medicare Other

## 2023-05-20 ENCOUNTER — Ambulatory Visit: Payer: Medicare Other | Admitting: Medical Oncology

## 2023-05-20 ENCOUNTER — Other Ambulatory Visit: Payer: Medicare Other

## 2023-05-20 VITALS — BP 111/92 | HR 90 | Temp 98.6°F | Resp 18

## 2023-05-20 DIAGNOSIS — D500 Iron deficiency anemia secondary to blood loss (chronic): Secondary | ICD-10-CM

## 2023-05-20 MED ORDER — SODIUM CHLORIDE 0.9 % IV SOLN
300.0000 mg | Freq: Once | INTRAVENOUS | Status: AC
  Administered 2023-05-20: 300 mg via INTRAVENOUS
  Filled 2023-05-20: qty 300

## 2023-05-20 MED ORDER — SODIUM CHLORIDE 0.9 % IV SOLN: Freq: Once | INTRAVENOUS | Status: AC

## 2023-05-20 NOTE — Patient Instructions (Signed)
 Iron Sucrose Injection What is this medication? IRON SUCROSE (EYE ern SOO krose) treats low levels of iron (iron deficiency anemia) in people with kidney disease. Iron is a mineral that plays an important role in making red blood cells, which carry oxygen from your lungs to the rest of your body. This medicine may be used for other purposes; ask your health care provider or pharmacist if you have questions. COMMON BRAND NAME(S): Venofer What should I tell my care team before I take this medication? They need to know if you have any of these conditions: Anemia not caused by low iron levels Heart disease High levels of iron in the blood Kidney disease Liver disease An unusual or allergic reaction to iron, other medications, foods, dyes, or preservatives Pregnant or trying to get pregnant Breastfeeding How should I use this medication? This medication is for infusion into a vein. It is given in a hospital or clinic setting. Talk to your care team about the use of this medication in children. While this medication may be prescribed for children as young as 2 years for selected conditions, precautions do apply. Overdosage: If you think you have taken too much of this medicine contact a poison control center or emergency room at once. NOTE: This medicine is only for you. Do not share this medicine with others. What if I miss a dose? Keep appointments for follow-up doses. It is important not to miss your dose. Call your care team if you are unable to keep an appointment. What may interact with this medication? Do not take this medication with any of the following: Deferoxamine Dimercaprol Other iron products This medication may also interact with the following: Chloramphenicol Deferasirox This list may not describe all possible interactions. Give your health care provider a list of all the medicines, herbs, non-prescription drugs, or dietary supplements you use. Also tell them if you smoke,  drink alcohol, or use illegal drugs. Some items may interact with your medicine. What should I watch for while using this medication? Visit your care team regularly. Tell your care team if your symptoms do not start to get better or if they get worse. You may need blood work done while you are taking this medication. You may need to follow a special diet. Talk to your care team. Foods that contain iron include: whole grains/cereals, dried fruits, beans, or peas, leafy green vegetables, and organ meats (liver, kidney). What side effects may I notice from receiving this medication? Side effects that you should report to your care team as soon as possible: Allergic reactions--skin rash, itching, hives, swelling of the face, lips, tongue, or throat Low blood pressure--dizziness, feeling faint or lightheaded, blurry vision Shortness of breath Side effects that usually do not require medical attention (report to your care team if they continue or are bothersome): Flushing Headache Joint pain Muscle pain Nausea Pain, redness, or irritation at injection site This list may not describe all possible side effects. Call your doctor for medical advice about side effects. You may report side effects to FDA at 1-800-FDA-1088. Where should I keep my medication? This medication is given in a hospital or clinic. It will not be stored at home. NOTE: This sheet is a summary. It may not cover all possible information. If you have questions about this medicine, talk to your doctor, pharmacist, or health care provider.  2024 Elsevier/Gold Standard (2023-02-26 00:00:00)

## 2023-05-23 ENCOUNTER — Encounter: Payer: Self-pay | Admitting: Neurosurgery

## 2023-05-25 ENCOUNTER — Ambulatory Visit: Payer: Medicare Other

## 2023-05-25 ENCOUNTER — Ambulatory Visit: Payer: Medicare Other | Admitting: Medical Oncology

## 2023-05-25 ENCOUNTER — Inpatient Hospital Stay: Payer: Medicare Other

## 2023-05-27 ENCOUNTER — Inpatient Hospital Stay: Payer: Medicare Other

## 2023-05-27 ENCOUNTER — Encounter: Payer: Self-pay | Admitting: Medical Oncology

## 2023-05-27 ENCOUNTER — Ambulatory Visit
Admission: EM | Admit: 2023-05-27 | Discharge: 2023-05-27 | Disposition: A | Discharge status: Home / Self Care | Payer: Medicare Other | Attending: Physician Assistant | Admitting: Physician Assistant

## 2023-05-27 ENCOUNTER — Inpatient Hospital Stay (HOSPITAL_BASED_OUTPATIENT_CLINIC_OR_DEPARTMENT_OTHER): Payer: Medicare Other | Admitting: Medical Oncology

## 2023-05-27 ENCOUNTER — Other Ambulatory Visit: Payer: Self-pay

## 2023-05-27 ENCOUNTER — Inpatient Hospital Stay: Payer: Medicare Other | Admitting: Medical Oncology

## 2023-05-27 ENCOUNTER — Ambulatory Visit: Payer: Medicare Other

## 2023-05-27 VITALS — BP 140/65 | HR 85

## 2023-05-27 VITALS — BP 147/69 | HR 92 | Temp 98.3°F | Resp 17 | Wt 211.0 lb

## 2023-05-27 DIAGNOSIS — D500 Iron deficiency anemia secondary to blood loss (chronic): Secondary | ICD-10-CM | POA: Diagnosis not present

## 2023-05-27 DIAGNOSIS — S99912A Unspecified injury of left ankle, initial encounter: Secondary | ICD-10-CM

## 2023-05-27 DIAGNOSIS — M25572 Pain in left ankle and joints of left foot: Secondary | ICD-10-CM | POA: Diagnosis not present

## 2023-05-27 DIAGNOSIS — D649 Anemia, unspecified: Secondary | ICD-10-CM

## 2023-05-27 LAB — CBC
HCT: 30.5 % — ABNORMAL LOW (ref 36.0–46.0)
Hemoglobin: 8.5 g/dL — ABNORMAL LOW (ref 12.0–15.0)
MCH: 23.2 pg — ABNORMAL LOW (ref 26.0–34.0)
MCHC: 27.9 g/dL — ABNORMAL LOW (ref 30.0–36.0)
MCV: 83.3 fL (ref 80.0–100.0)
Platelets: 255 10*3/uL (ref 150–400)
RBC: 3.66 MIL/uL — ABNORMAL LOW (ref 3.87–5.11)
RDW: 22.4 % — ABNORMAL HIGH (ref 11.5–15.5)
WBC: 5.4 10*3/uL (ref 4.0–10.5)
nRBC: 0 % (ref 0.0–0.2)

## 2023-05-27 LAB — SAMPLE TO BLOOD BANK

## 2023-05-27 MED ORDER — SODIUM CHLORIDE 0.9 % IV SOLN: Freq: Once | INTRAVENOUS | Status: AC

## 2023-05-27 MED ORDER — SODIUM CHLORIDE 0.9 % IV SOLN
300.0000 mg | Freq: Once | INTRAVENOUS | Status: AC
  Administered 2023-05-27: 300 mg via INTRAVENOUS
  Filled 2023-05-27: qty 300

## 2023-05-27 NOTE — Progress Notes (Signed)
Hematology and Oncology Follow Up Visit  Lisa Good [id number] November 18, 1950 72 y.o. 05/27/2023  Past Medical History:  Diagnosis Date   Allergy    seasonal   Arthritis    Cataract    Cerebral aneurysm    Chronic kidney disease    Chronic pain syndrome    Complication of anesthesia    trouble waking up after back surgery   DDD (degenerative disc disease), lumbar    Depression    DVT (deep venous thrombosis) (HCC) 2002   Fibromyalgia    GERD (gastroesophageal reflux disease)    Heart murmur    Heart dr said normal for me   History of kidney stones    Hyperlipidemia    Hypertension    Hypothyroidism    Incontinence in female    Iron deficiency anemia 11/01/2019   Irregular heart beat    Neuromuscular disorder (HCC)    peripheral neuropathy/feet   Obesity    OSA on CPAP    Pancolitis (HCC)    Pre-diabetes    RLS (restless legs syndrome)    UTI (urinary tract infection) 11/2022    Principle Diagnosis:  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 CKD  Current Therapy:   Venofer 300 mg - last      Interim History:  Lisa Good is back for follow-up for her IDA. She was first seen by our office on 05/13/2023. Labs from 03/29/2023 show an iron of 19, iron saturation of 4%, ferritin of 7 on 03/18/2023. Last CBC on 03/30/2023 showed a hemoglobin of 8.2, MCV of 80.6. She was started on Venofer 300 mg weekly x 3 weeks total. She is s/p one dose of Venofer on 05/20/2023. She is here today for repeat labs and consideration of additional Venofer.   Today she reports that she tolerated the Venofer well. Her symptoms of fatigue, pica, are stable but her SOB has improved. She has not noticed any further GI bleeding.   She has an repeat endoscopy/colonoscopy planned for 06/13/2023.      Wt Readings from Last 3 Encounters:  05/27/23 211 lb (95.7 kg)  05/13/23 195 lb (88.5 kg)  04/27/23 195 lb (88.5 kg)     Medications:   Current Outpatient Medications:    albuterol (VENTOLIN HFA) 108 (90  Base) MCG/ACT inhaler, Inhale 1-2 puffs into the lungs every 6 (six) hours as needed for wheezing or shortness of breath., Disp: 1 each, Rfl: 0   Alpha-Lipoic Acid 600 MG CAPS, Take 600 mg by mouth 2 (two) times daily., Disp: , Rfl:    amLODipine (NORVASC) 10 MG tablet, Take 10 mg by mouth at bedtime., Disp: , Rfl:    gabapentin (NEURONTIN) 800 MG tablet, Take 1 tablet (800 mg total) by mouth 4 (four) times daily. (Patient taking differently: Take 800 mg by mouth 3 (three) times daily. 1 in am, 1 at lunch and 2 at bedtime), Disp: 120 tablet, Rfl: 3   ipratropium (ATROVENT) 0.06 % nasal spray, Place 2 sprays into both nostrils 3 (three) times daily. As needed for nasal congestion, runny nose (Patient not taking: Reported on 05/13/2023), Disp: 15 mL, Rfl: 1   lisinopril (PRINIVIL,ZESTRIL) 40 MG tablet, Take 40 mg by mouth at bedtime., Disp: , Rfl:    methocarbamol (ROBAXIN) 500 MG tablet, Take 1 tablet (500 mg total) by mouth 4 (four) times daily., Disp: 120 tablet, Rfl: 0   oxyCODONE-acetaminophen (PERCOCET) 10-325 MG tablet, Take 1 tablet by mouth 4 (four) times daily., Disp: , Rfl:  pantoprazole (PROTONIX) 40 MG tablet, Take 40 mg by mouth every morning., Disp: , Rfl:    traZODone (DESYREL) 150 MG tablet, Take 1 tablet (150 mg total) by mouth at bedtime. (Patient taking differently: Take 100 mg by mouth at bedtime.), Disp: 90 tablet, Rfl: 0   venlafaxine XR (EFFEXOR-XR) 150 MG 24 hr capsule, Take 150 mg by mouth at bedtime. Take with 75 mg to equal 225 mg daily, Disp: , Rfl:    venlafaxine XR (EFFEXOR-XR) 75 MG 24 hr capsule, Take 75 mg by mouth at bedtime. Take with 150 mg to equal 225 mg daily, Disp: , Rfl:    XYLITOL MT, Use as directed 2 tablets in the mouth or throat daily as needed (dry mouth)., Disp: , Rfl:   Allergies:  Allergies  Allergen Reactions   Contrast Media [Iodinated Contrast Media] Hives   Duloxetine Swelling    Legs and feet   Pregabalin Swelling    Legs and feet   Iodine  Hives   Clarithromycin Nausea Only   Spironolactone-Hctz Rash    Only with iv injection    Past Medical History, Surgical history, Social history, and Family History were reviewed and updated.  Review of Systems: Review of Systems - Oncology   Physical Exam:  weight is 211 lb (95.7 kg). Her oral temperature is 98.3 F (36.8 C). Her blood pressure is 147/69 (abnormal) and her pulse is 92. Her respiration is 17 and oxygen saturation is 94%.   Physical Exam General: NAD Cardiovascular: regular rate and rhythm Pulmonary: clear ant fields Abdomen: soft, nontender, + bowel sounds GU: no suprapubic tenderness Extremities: no edema, no joint deformities Skin: no rashes Neurological: Weakness but otherwise nonfocal   Lab Results  Component Value Date   WBC 5.4 05/27/2023   HGB 8.5 (L) 05/27/2023   HCT 30.5 (L) 05/27/2023   MCV 83.3 05/27/2023   PLT 255 05/27/2023     Chemistry      Component Value Date/Time   NA 145 05/13/2023 1251   K 4.2 05/13/2023 1251   CL 107 05/13/2023 1251   CO2 30 05/13/2023 1251   BUN 22 05/13/2023 1251   CREATININE 1.03 (H) 05/13/2023 1251      Component Value Date/Time   CALCIUM 9.5 05/13/2023 1251   ALKPHOS 86 05/13/2023 1251   AST 11 (L) 05/13/2023 1251   ALT 6 05/13/2023 1251   BILITOT 0.2 (L) 05/13/2023 1251     Encounter Diagnosis  Name Primary?   Iron deficiency anemia due to chronic blood loss Yes    Assessment and Plan- Patient is a 72 y.o. 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 Currently s/p Venofer 300 mg on 05/20/2023.   Today her Hgb has trended up nicely from 7.6 to 8.5. She will not require PRBC tomorrow. She will get Venofer once weekly x 2 more sessions. She will have her scheduled endoscopy/colonoscopy.   Disposition: Venofer today RTC 1 week for Venofer (should already be scheduled) RTC 1 months me, labs (CBC w/, CMP, iron, ferritin, retic) -   Lisa Jacks PA-C 8/22/202410:13 AM

## 2023-05-27 NOTE — Patient Instructions (Signed)
 Iron Sucrose Injection What is this medication? IRON SUCROSE (EYE ern SOO krose) treats low levels of iron (iron deficiency anemia) in people with kidney disease. Iron is a mineral that plays an important role in making red blood cells, which carry oxygen from your lungs to the rest of your body. This medicine may be used for other purposes; ask your health care provider or pharmacist if you have questions. COMMON BRAND NAME(S): Venofer What should I tell my care team before I take this medication? They need to know if you have any of these conditions: Anemia not caused by low iron levels Heart disease High levels of iron in the blood Kidney disease Liver disease An unusual or allergic reaction to iron, other medications, foods, dyes, or preservatives Pregnant or trying to get pregnant Breastfeeding How should I use this medication? This medication is for infusion into a vein. It is given in a hospital or clinic setting. Talk to your care team about the use of this medication in children. While this medication may be prescribed for children as young as 2 years for selected conditions, precautions do apply. Overdosage: If you think you have taken too much of this medicine contact a poison control center or emergency room at once. NOTE: This medicine is only for you. Do not share this medicine with others. What if I miss a dose? Keep appointments for follow-up doses. It is important not to miss your dose. Call your care team if you are unable to keep an appointment. What may interact with this medication? Do not take this medication with any of the following: Deferoxamine Dimercaprol Other iron products This medication may also interact with the following: Chloramphenicol Deferasirox This list may not describe all possible interactions. Give your health care provider a list of all the medicines, herbs, non-prescription drugs, or dietary supplements you use. Also tell them if you smoke,  drink alcohol, or use illegal drugs. Some items may interact with your medicine. What should I watch for while using this medication? Visit your care team regularly. Tell your care team if your symptoms do not start to get better or if they get worse. You may need blood work done while you are taking this medication. You may need to follow a special diet. Talk to your care team. Foods that contain iron include: whole grains/cereals, dried fruits, beans, or peas, leafy green vegetables, and organ meats (liver, kidney). What side effects may I notice from receiving this medication? Side effects that you should report to your care team as soon as possible: Allergic reactions--skin rash, itching, hives, swelling of the face, lips, tongue, or throat Low blood pressure--dizziness, feeling faint or lightheaded, blurry vision Shortness of breath Side effects that usually do not require medical attention (report to your care team if they continue or are bothersome): Flushing Headache Joint pain Muscle pain Nausea Pain, redness, or irritation at injection site This list may not describe all possible side effects. Call your doctor for medical advice about side effects. You may report side effects to FDA at 1-800-FDA-1088. Where should I keep my medication? This medication is given in a hospital or clinic. It will not be stored at home. NOTE: This sheet is a summary. It may not cover all possible information. If you have questions about this medicine, talk to your doctor, pharmacist, or health care provider.  2024 Elsevier/Gold Standard (2023-02-26 00:00:00)

## 2023-05-27 NOTE — ED Notes (Signed)
Ace wrap applied with instruction and information to left ankle.  Tolerated well.  B. Roten CMA

## 2023-05-27 NOTE — ED Triage Notes (Signed)
"  I fell around 230pm, I was going down steps to garage and lost my balance, I may have had untied shoe". "I tripped up and twisted left ankle and heard a snap".

## 2023-05-27 NOTE — ED Provider Notes (Signed)
[facility]    CSN: [id number] Arrival date & time: 05/27/23  1557      History   Chief Complaint Chief Complaint  Patient presents with   Fall    HPI Lisa Good is a 72 y.o. female.   Patient here today for evaluation of left ankle pain after she fell down 3 steps earlier in her garage.  She states that she accidentally lost her balance and might of had an untied shoe.  She states that she heard her ankle snap when she twisted it.  She has not had any numbness but reports some tingling.  She does have neuropathy at baseline.  She denies any head injury or loss of consciousness.  She does not report pain elsewhere.  She states she took her oxycodone without improvement  The history is provided by the patient.  Fall Pertinent negatives include no abdominal pain.    Past Medical History:  Diagnosis Date   Allergy    seasonal   Arthritis    Cataract    Cerebral aneurysm    Chronic kidney disease    Chronic pain syndrome    Complication of anesthesia    trouble waking up after back surgery   DDD (degenerative disc disease), lumbar    Depression    DVT (deep venous thrombosis) (HCC) 2002   Fibromyalgia    GERD (gastroesophageal reflux disease)    Heart murmur    Heart dr said normal for me   History of kidney stones    Hyperlipidemia    Hypertension    Hypothyroidism    Incontinence in female    Iron deficiency anemia 11/01/2019   Irregular heart beat    Neuromuscular disorder (HCC)    peripheral neuropathy/feet   Obesity    OSA on CPAP    Pancolitis (HCC)    Pre-diabetes    RLS (restless legs syndrome)    UTI (urinary tract infection) 11/2022    Patient Active Problem List   Diagnosis Date Noted   Battery end of life of spinal cord stimulator 02/01/2023   Malfunction of spinal cord stimulator (HCC) 02/01/2023   Anxiety and depression 01/21/2023   Chronic right shoulder pain 11/11/2020   Right rotator cuff tear arthropathy 11/11/2020    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   Chronic radicular lumbar pain 12/04/2019   Iron deficiency anemia 11/01/2019   Primary osteoarthritis of left knee 10/17/2019   Primary osteoarthritis of right shoulder 10/17/2019   Hx of cervical spine surgery 08/21/2019   Pancolitis (HCC) 07/20/2019   Kyphosis of cervical region 07/14/2019   Cervical spondylosis with myelopathy 06/28/2019   Chronic low back pain 06/28/2019   Cervical spondylosis without myelopathy 11/09/2018   Foraminal stenosis of cervical region 11/09/2018   Cervical stenosis of spinal canal 11/09/2018   History of fusion of lumbar spine 11/09/2018   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   Postlaminectomy syndrome of lumbosacral region 11/09/2018   Chronic pain syndrome 11/09/2018   Severe obesity (BMI 35.0-39.9) with comorbidity (HCC) 08/18/2018   Primary osteoarthritis of right knee 02/17/2017   Incontinence in female 10/05/2016   Spondylolisthesis, lumbar region 05/05/2016   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   OSA (obstructive sleep apnea) 05/31/2015   Hypothyroidism 02/09/2014   Sleep disorder 02/09/2014   Asymptomatic hyperuricemia 08/29/2013   Polyarthritis 08/29/2013   Essential hypertension 06/28/2013   SUI (stress urinary incontinence, female) 06/20/2013   Insomnia 05/22/2013   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  Peripheral neuropathy 05/25/2012   Venous (peripheral) insufficiency 05/25/2012   Restless legs syndrome (RLS) 10/03/2009   Fibromyalgia 02/18/2009   Gastro-esophageal reflux disease with esophagitis 10/23/2008   GERD (gastroesophageal reflux disease) 10/23/2008   Pure hypercholesterolemia 10/23/2008    Past Surgical History:  Procedure Laterality Date   BRAIN SURGERY  2002   aneurysm   CEREBRAL ANEURYSM REPAIR  2002   COLONOSCOPY WITH PROPOFOL N/A 11/10/2019   Procedure: COLONOSCOPY WITH PROPOFOL;  Surgeon: Wyline Mood, MD;  Location: [facility];   Service: Endoscopy;  Laterality: N/A;   COSMETIC SURGERY     ESOPHAGOGASTRODUODENOSCOPY (EGD) WITH PROPOFOL N/A 11/10/2019   Procedure: ESOPHAGOGASTRODUODENOSCOPY (EGD) WITH PROPOFOL;  Surgeon: Wyline Mood, MD;  Location: [facility];  Service: Endoscopy;  Laterality: N/A;   EXTRACORPOREAL SHOCK WAVE LITHOTRIPSY Right 04/27/2019   Procedure: EXTRACORPOREAL SHOCK WAVE LITHOTRIPSY (ESWL);  Surgeon: Sondra Come, MD;  Location: [facility];  Service: Urology;  Laterality: Right;   EYE SURGERY     JOINT REPLACEMENT Bilateral    knee   KNEE ARTHROPLASTY Left 01/12/2020   Procedure: COMPUTER ASSISTED TOTAL KNEE ARTHROPLASTY;  Surgeon: Donato Heinz, MD;  Location: [facility];  Service: Orthopedics;  Laterality: Left;   POLYPECTOMY  11/10/2019   Procedure: POLYPECTOMY;  Surgeon: Wyline Mood, MD;  Location: [facility];  Service: Endoscopy;;   REVERSE SHOULDER ARTHROPLASTY Right 01/06/2022   Procedure: REVERSE SHOULDER ARTHROPLASTY;  Surgeon: Christena Flake, MD;  Location: [facility];  Service: Orthopedics;  Laterality: Right;   SPINAL CORD STIMULATOR BATTERY EXCHANGE N/A 02/01/2023   Procedure: INTERNAL PULSE GENERATOR REPLACEMENT ([employer]);  Surgeon: Venetia Night, MD;  Location: [facility];  Service: Neurosurgery;  Laterality: N/A;  LOCAL WITH MAC   SPINAL FUSION  07/10/2019   C4-5 corpectomy with C6-7 ACDF, C2-T3 spinal fusion   SPINE SURGERY     spinal fusion   THORACIC LAMINECTOMY FOR SPINAL CORD STIMULATOR N/A 12/25/2022   Procedure: THORACIC LAMINECTOMY FOR SPINAL CORD STIMULATOR IMPLANTATION ([employer]);  Surgeon: Venetia Night, MD;  Location: [facility];  Service: Neurosurgery;  Laterality: N/A;   TUBAL LIGATION      OB History   No obstetric history on file.      Home Medications    Prior to Admission medications   Medication Sig Start Date End Date Taking? Authorizing Provider  albuterol (VENTOLIN HFA) 108 (90 Base) MCG/ACT inhaler Inhale  1-2 puffs into the lungs every 6 (six) hours as needed for wheezing or shortness of breath. 04/27/23   Gustavus Bryant, FNP  Alpha-Lipoic Acid 600 MG CAPS Take 600 mg by mouth 2 (two) times daily.    [provider]  amLODipine (NORVASC) 10 MG tablet Take 10 mg by mouth at bedtime.    [provider]  gabapentin (NEURONTIN) 800 MG tablet Take 1 tablet (800 mg total) by mouth 4 (four) times daily. Patient taking differently: Take 800 mg by mouth 3 (three) times daily. 1 in am, 1 at lunch and 2 at bedtime 01/09/21   Edward Jolly, MD  ipratropium (ATROVENT) 0.06 % nasal spray Place 2 sprays into both nostrils 3 (three) times daily. As needed for nasal congestion, runny nose Patient not taking: Reported on 05/13/2023 10/03/22   Theadora Rama Scales, PA-C  lisinopril (PRINIVIL,ZESTRIL) 40 MG tablet Take 40 mg by mouth at bedtime.    [provider]  methocarbamol (ROBAXIN) 500 MG tablet Take 1 tablet (500 mg total) by mouth 4 (four) times daily. 02/21/23  Venetia Night, MD  oxyCODONE-acetaminophen (PERCOCET) 10-325 MG tablet Take 1 tablet by mouth 4 (four) times daily. 11/30/21   [provider]  pantoprazole (PROTONIX) 40 MG tablet Take 40 mg by mouth every morning. 11/17/21   [provider]  traZODone (DESYREL) 150 MG tablet Take 1 tablet (150 mg total) by mouth at bedtime. Patient taking differently: Take 100 mg by mouth at bedtime. 01/20/23   McElwee, Lauren A, NP  venlafaxine XR (EFFEXOR-XR) 150 MG 24 hr capsule Take 150 mg by mouth at bedtime. Take with 75 mg to equal 225 mg daily 10/26/19   [provider]  venlafaxine XR (EFFEXOR-XR) 75 MG 24 hr capsule Take 75 mg by mouth at bedtime. Take with 150 mg to equal 225 mg daily 10/16/21   [provider]  XYLITOL MT Use as directed 2 tablets in the mouth or throat daily as needed (dry mouth).    [provider]    Family History Family History  Problem Relation Age of Onset    Hypertension Mother    Depression Mother    Diabetes Mother    Varicose Veins Mother    Hyperlipidemia Sister    Hypertension Sister    Depression Sister    Varicose Veins Sister    Depression Brother    Arthritis Maternal Grandmother    Diabetes Maternal Grandmother    Sleep apnea Neg Hx     Social History Social History   Tobacco Use   Smoking status: Former    Current packs/day: 0.00    Average packs/day: 1.5 packs/day for 20.0 years (30.0 ttl pk-yrs)    Types: Cigarettes    Start date: 10/29/1984    Quit date: 10/29/2004    Years since quitting: 18.5    Passive exposure: Past   Smokeless tobacco: Never  Vaping Use   Vaping status: Never Used  Substance Use Topics   Alcohol use: Not Currently    Comment: Rare social drinking   Drug use: Never     Allergies   Contrast media [iodinated contrast media], Duloxetine, Pregabalin, Iodine, Clarithromycin, and Spironolactone-hctz   Review of Systems Review of Systems  Constitutional:  Negative for chills and fever.  Eyes:  Negative for discharge and redness.  Gastrointestinal:  Negative for abdominal pain, nausea and vomiting.  Musculoskeletal:  Positive for arthralgias and joint swelling.     Physical Exam Triage Vital Signs ED Triage Vitals  Encounter Vitals Group     BP 05/27/23 1608 (!) 145/82     Systolic BP Percentile --      Diastolic BP Percentile --      Pulse Rate 05/27/23 1608 (!) 112     Resp 05/27/23 1608 20     Temp 05/27/23 1608 98.6 F (37 C)     Temp Source 05/27/23 1608 Oral     SpO2 05/27/23 1608 95 %     Weight 05/27/23 1607 210 lb 15.7 oz (95.7 kg)     Height 05/27/23 1607 5\' 5"  (1.651 m)     Head Circumference --      Peak Flow --      Pain Score 05/27/23 1603 9     Pain Loc --      Pain Education --      Exclude from Growth Chart --    No data found.  Updated Vital Signs BP (!) 145/82 (BP Location: Right Arm)   Pulse (!) 112   Temp 98.6 F (37 C) (Oral)  Resp 20   Ht 5\' 5"   (1.651 m)   Wt 210 lb 15.7 oz (95.7 kg)   SpO2 95%   BMI 35.11 kg/m      Physical Exam Vitals and nursing note reviewed.  Constitutional:      General: She is not in acute distress.    Appearance: Normal appearance. She is not ill-appearing.  HENT:     Head: Normocephalic and atraumatic.  Eyes:     Conjunctiva/sclera: Conjunctivae normal.  Cardiovascular:     Rate and Rhythm: Normal rate.  Pulmonary:     Effort: Pulmonary effort is normal. No respiratory distress.  Musculoskeletal:     Comments: Swelling noted to lateral left malleolus with TTP to same. Normal ROM of left toes, decreased  ROM of left ankle in all directions due to pain.   Skin:    Capillary Refill: Normal cap refill to left toes Neurological:     Mental Status: She is alert.     Comments: Gross sensation intact to left toes  Psychiatric:        Mood and Affect: Mood normal.        Behavior: Behavior normal.        Thought Content: Thought content normal.      UC Treatments / Results  Labs (all labs ordered are listed, but only abnormal results are displayed) Labs Reviewed - No data to display  EKG   Radiology DG Ankle Complete Left  Result Date: 05/27/2023 CLINICAL DATA:  Pain. Swelling. Injury. Non weight bearing. EXAM: LEFT ANKLE COMPLETE - 3+ VIEW COMPARISON:  None Available. FINDINGS: Mild diffuse soft tissue swelling, slightly greater laterally. No acute bony abnormality. Specifically, no fracture, subluxation, or dislocation. IMPRESSION: No acute bony abnormality. Electronically Signed   By: Charlett Nose M.D.   On: 05/27/2023 17:08    Procedures Procedures (including critical care time)  Medications Ordered in UC Medications - No data to display  Initial Impression / Assessment and Plan / UC Course  I have reviewed the triage vital signs and the nursing notes.  Pertinent labs & imaging results that were available during my care of the patient were reviewed by me and considered in my  medical decision making (see chart for details).    No fracture noted on x-ray.  Suspect likely sprain or strain.  Advised RICE therapy.  Ace wrap applied in office and crutches administered at patient's request.  There was some concern given age and possible additional injury with crutches however patient requests crutches regardless.  Recommended follow-up with Ortho should symptoms worsen in any way or fail to improve over the next week.  Patient expresses understanding.  Final Clinical Impressions(s) / UC Diagnoses   Final diagnoses:  Left ankle injury, initial encounter   Discharge Instructions   None    ED Prescriptions   None    PDMP not reviewed this encounter.   Tomi Bamberger, PA-C 05/27/23 1747

## 2023-05-31 ENCOUNTER — Telehealth: Payer: Self-pay | Admitting: Family Medicine

## 2023-05-31 NOTE — Telephone Encounter (Signed)
LVM and sent mychart msg informing pt of need to reschedule 06/08/23 appt - NP out

## 2023-06-03 ENCOUNTER — Ambulatory Visit: Payer: Medicare Other

## 2023-06-04 ENCOUNTER — Inpatient Hospital Stay: Payer: Medicare Other

## 2023-06-08 ENCOUNTER — Telehealth: Payer: Medicare Other | Admitting: Family Medicine

## 2023-06-14 ENCOUNTER — Encounter: Payer: Self-pay | Admitting: Urology

## 2023-06-14 ENCOUNTER — Ambulatory Visit (INDEPENDENT_AMBULATORY_CARE_PROVIDER_SITE_OTHER): Payer: Medicare Other | Admitting: Urology

## 2023-06-14 ENCOUNTER — Ambulatory Visit: Payer: Medicare Other | Admitting: Urology

## 2023-06-14 DIAGNOSIS — N3946 Mixed incontinence: Secondary | ICD-10-CM

## 2023-06-14 LAB — URINALYSIS, COMPLETE
Bilirubin, UA: NEGATIVE
Glucose, UA: NEGATIVE
Ketones, UA: NEGATIVE
Leukocytes,UA: NEGATIVE
Nitrite, UA: NEGATIVE
Protein,UA: NEGATIVE
RBC, UA: NEGATIVE
Specific Gravity, UA: 1.015 (ref 1.005–1.030)
Urobilinogen, Ur: 0.2 mg/dL (ref 0.2–1.0)
pH, UA: 5.5 (ref 5.0–7.5)

## 2023-06-14 LAB — MICROSCOPIC EXAMINATION

## 2023-06-14 MED ORDER — TRIMETHOPRIM 100 MG PO TABS
100.0000 mg | ORAL_TABLET | Freq: Every day | ORAL | 3 refills | Status: DC
Start: 2023-06-14 — End: 2023-12-27

## 2023-06-14 NOTE — Progress Notes (Signed)
06/14/2023 1:31 PM   Lisa Good May 29, 1951 [id number]  Referring provider: Patrice Paradise, MD [address] [place]  [place] [address]  Chief Complaint  Patient presents with   Urinary Incontinence    HPI: Reviewed long note.  Patient gets a lot of urinary tract infections.  In April 2024 she had Botox I felt 80% of bladder dysfunction was overactive bladder with urge incontinence and mild stress incontinence.  Did not want to do percutaneous tibial nerve stimulation due to travel time.  Had 2 Botox.  She has had tube synthetic slings 1 pubovaginal sling in [place].  She saw a nurse practitioner in June 2024.  After the first Botox she was 40% better but we decided to repeat it recognizing she may consider InterStim in the future.  She does have a back stimulator tried multiple medication including Myrbetriq Gemtesa and some antimuscarinics.  Leslye Peer was too expensive.  She went into retention after the first Botox seen weeks afterwards with a residual of 541 mL.  Her flow symptoms worsened after her surgical procedure the day prior.  She had a positive urine culture April 2024 and 2 negative cultures in May and June  When she saw the nurse practitioner recently she really did not think Botox helped a lot.  She was no longer on suppressive trimethoprim.  He had a Foley catheter for a week and came in for a trial of voiding with a residual 177 mL.  Is clinically not infected today but we both agreed to go back on trimethoprim.  She still wearing 3 or 4 pads a day moderately wet for urge incontinence and Botox did not work very well.  When she temporarily went into retention as noted above she had had a spinal cord stimulator for pain the day before and she says she really does not want it.  Her flow was but stable    PMH: Past Medical History:  Diagnosis Date   Allergy    seasonal   Arthritis    Cataract    Cerebral aneurysm    Chronic kidney  disease    Chronic pain syndrome    Complication of anesthesia    trouble waking up after back surgery   DDD (degenerative disc disease), lumbar    Depression    DVT (deep venous thrombosis) (HCC) 2002   Fibromyalgia    GERD (gastroesophageal reflux disease)    Heart murmur    Heart dr said normal for me   History of kidney stones    Hyperlipidemia    Hypertension    Hypothyroidism    Incontinence in female    Iron deficiency anemia 11/01/2019   Irregular heart beat    Neuromuscular disorder (HCC)    peripheral neuropathy/feet   Obesity    OSA on CPAP    Pancolitis (HCC)    Pre-diabetes    RLS (restless legs syndrome)    UTI (urinary tract infection) 11/2022    Surgical History: Past Surgical History:  Procedure Laterality Date   BRAIN SURGERY  2002   aneurysm   CEREBRAL ANEURYSM REPAIR  2002   COLONOSCOPY WITH PROPOFOL N/A 11/10/2019   Procedure: COLONOSCOPY WITH PROPOFOL;  Surgeon: Wyline Mood, MD;  Location: [facility];  Service: Endoscopy;  Laterality: N/A;   COSMETIC SURGERY     ESOPHAGOGASTRODUODENOSCOPY (EGD) WITH PROPOFOL N/A 11/10/2019   Procedure: ESOPHAGOGASTRODUODENOSCOPY (EGD) WITH PROPOFOL;  Surgeon: Wyline Mood, MD;  Location: [facility];  Service:  Endoscopy;  Laterality: N/A;   EXTRACORPOREAL SHOCK WAVE LITHOTRIPSY Right 04/27/2019   Procedure: EXTRACORPOREAL SHOCK WAVE LITHOTRIPSY (ESWL);  Surgeon: Sondra Come, MD;  Location: [facility];  Service: Urology;  Laterality: Right;   EYE SURGERY     JOINT REPLACEMENT Bilateral    knee   KNEE ARTHROPLASTY Left 01/12/2020   Procedure: COMPUTER ASSISTED TOTAL KNEE ARTHROPLASTY;  Surgeon: Donato Heinz, MD;  Location: [facility];  Service: Orthopedics;  Laterality: Left;   POLYPECTOMY  11/10/2019   Procedure: POLYPECTOMY;  Surgeon: Wyline Mood, MD;  Location: [facility];  Service: Endoscopy;;   REVERSE SHOULDER ARTHROPLASTY Right 01/06/2022   Procedure: REVERSE SHOULDER  ARTHROPLASTY;  Surgeon: Christena Flake, MD;  Location: [facility];  Service: Orthopedics;  Laterality: Right;   SPINAL CORD STIMULATOR BATTERY EXCHANGE N/A 02/01/2023   Procedure: INTERNAL PULSE GENERATOR REPLACEMENT ([employer]);  Surgeon: Venetia Night, MD;  Location: [facility];  Service: Neurosurgery;  Laterality: N/A;  LOCAL WITH MAC   SPINAL FUSION  07/10/2019   C4-5 corpectomy with C6-7 ACDF, C2-T3 spinal fusion   SPINE SURGERY     spinal fusion   THORACIC LAMINECTOMY FOR SPINAL CORD STIMULATOR N/A 12/25/2022   Procedure: THORACIC LAMINECTOMY FOR SPINAL CORD STIMULATOR IMPLANTATION ([employer]);  Surgeon: Venetia Night, MD;  Location: [facility];  Service: Neurosurgery;  Laterality: N/A;   TUBAL LIGATION      Home Medications:  Allergies as of 06/14/2023       Reactions   Contrast Media [iodinated Contrast Media] Hives   Duloxetine Swelling   Legs and feet   Pregabalin Swelling   Legs and feet   Iodine Hives   Clarithromycin Nausea Only   Spironolactone-hctz Rash   Only with iv injection        Medication List        Accurate as of June 14, 2023  1:31 PM. If you have any questions, ask your nurse or doctor.          STOP taking these medications    gabapentin 800 MG tablet Commonly known as: NEURONTIN Stopped by: Lorin Picket A Kerem Gilmer   ipratropium 0.06 % nasal spray Commonly known as: ATROVENT Stopped by: Lorin Picket A Srinivas Lippman   traZODone 150 MG tablet Commonly known as: DESYREL Stopped by: Lorin Picket A Latravion Graves       TAKE these medications    albuterol 108 (90 Base) MCG/ACT inhaler Commonly known as: VENTOLIN HFA Inhale 1-2 puffs into the lungs every 6 (six) hours as needed for wheezing or shortness of breath.   Alpha-Lipoic Acid 600 MG Caps Take 600 mg by mouth 2 (two) times daily.   amLODipine 10 MG tablet Commonly known as: NORVASC Take 10 mg by mouth at bedtime.   lisinopril 40 MG tablet Commonly known as: ZESTRIL Take 40 mg  by mouth at bedtime.   methocarbamol 500 MG tablet Commonly known as: ROBAXIN Take 1 tablet (500 mg total) by mouth 4 (four) times daily.   oxyCODONE-acetaminophen 10-325 MG tablet Commonly known as: PERCOCET Take 1 tablet by mouth 4 (four) times daily.   pantoprazole 40 MG tablet Commonly known as: PROTONIX Take 40 mg by mouth every morning.   venlafaxine XR 150 MG 24 hr capsule Commonly known as: EFFEXOR-XR Take 150 mg by mouth at bedtime. Take with 75 mg to equal 225 mg daily   venlafaxine XR 75 MG 24 hr capsule Commonly known as: EFFEXOR-XR Take 75 mg by mouth at bedtime. Take with 150 mg to  equal 225 mg daily   XYLITOL MT Use as directed 2 tablets in the mouth or throat daily as needed (dry mouth).        Allergies:  Allergies  Allergen Reactions   Contrast Media [Iodinated Contrast Media] Hives   Duloxetine Swelling    Legs and feet   Pregabalin Swelling    Legs and feet   Iodine Hives   Clarithromycin Nausea Only   Spironolactone-Hctz Rash    Only with iv injection    Family History: Family History  Problem Relation Age of Onset   Hypertension Mother    Depression Mother    Diabetes Mother    Varicose Veins Mother    Hyperlipidemia Sister    Hypertension Sister    Depression Sister    Varicose Veins Sister    Depression Brother    Arthritis Maternal Grandmother    Diabetes Maternal Grandmother    Sleep apnea Neg Hx     Social History:  reports that she quit smoking about 18 years ago. Her smoking use included cigarettes. She started smoking about 38 years ago. She has a 30 pack-year smoking history. She has been exposed to tobacco smoke. She has never used smokeless tobacco. She reports that she does not currently use alcohol. She reports that she does not use drugs.  ROS:                                        Physical Exam: BP (!) 149/85   Pulse (!) 104   Ht 5\' 4"  (1.626 m)   Wt 90.7 kg   BMI 34.33 kg/m    Constitutional:  Alert and oriented, No acute distress. HEENT: [place] AT, moist mucus membranes.  Trachea midline, no masses.   Laboratory Data: Lab Results  Component Value Date   WBC 5.4 05/27/2023   HGB 8.5 (L) 05/27/2023   HCT 30.5 (L) 05/27/2023   MCV 83.3 05/27/2023   PLT 255 05/27/2023    Lab Results  Component Value Date   CREATININE 1.03 (H) 05/13/2023    No results found for: "PSA"  No results found for: "TESTOSTERONE"  No results found for: "HGBA1C"  Urinalysis    Component Value Date/Time   COLORURINE YELLOW 02/20/2023 1617   APPEARANCEUR Clear 03/18/2023 1119   LABSPEC 1.015 02/20/2023 1617   PHURINE 6.5 02/20/2023 1617   GLUCOSEU Negative 03/18/2023 1119   HGBUR NEGATIVE 02/20/2023 1617   BILIRUBINUR Negative 03/18/2023 1119   KETONESUR NEGATIVE 02/20/2023 1617   PROTEINUR Negative 03/18/2023 1119   PROTEINUR NEGATIVE 02/20/2023 1617   UROBILINOGEN 0.2 07/26/2022 1550   NITRITE Negative 03/18/2023 1119   NITRITE NEGATIVE 02/20/2023 1617   LEUKOCYTESUR 1+ (A) 03/18/2023 1119   LEUKOCYTESUR SMALL (A) 02/20/2023 1617    Pertinent Imaging:   Assessment & Plan: Call if culture positive.  Go back on trimethoprim 100 mg 90 x 3.  She will like to do percutaneous tibial nerve stimulation.  Handout given.  Hopefully this greatly improves her incontinence.  She does not want InterStim.  She realizes that were getting to the end of the treatment pathway  1. Mixed incontinence  - Urinalysis, Complete   No follow-ups on file.  Martina Sinner, MD  [facility] [address], [address] [place], [place] [address] [phone]

## 2023-06-15 ENCOUNTER — Telehealth: Payer: Self-pay

## 2023-06-15 NOTE — Telephone Encounter (Signed)
Per Dr Sherron Monday patient is to start PTNS. NO PA is required. NO co pay.  Patient advised and appointments set up

## 2023-06-17 ENCOUNTER — Inpatient Hospital Stay: Payer: Medicare Other | Attending: Medical Oncology

## 2023-06-17 ENCOUNTER — Other Ambulatory Visit: Payer: Self-pay | Admitting: Nurse Practitioner

## 2023-06-17 VITALS — BP 144/71 | HR 86 | Resp 17

## 2023-06-17 DIAGNOSIS — K922 Gastrointestinal hemorrhage, unspecified: Secondary | ICD-10-CM | POA: Insufficient documentation

## 2023-06-17 DIAGNOSIS — D500 Iron deficiency anemia secondary to blood loss (chronic): Secondary | ICD-10-CM | POA: Insufficient documentation

## 2023-06-17 MED ORDER — SODIUM CHLORIDE 0.9 % IV SOLN
300.0000 mg | Freq: Once | INTRAVENOUS | Status: AC
  Administered 2023-06-17: 300 mg via INTRAVENOUS
  Filled 2023-06-17: qty 300

## 2023-06-17 MED ORDER — SODIUM CHLORIDE 0.9 % IV SOLN: Freq: Once | INTRAVENOUS | Status: AC

## 2023-06-17 NOTE — Patient Instructions (Signed)
Iron Sucrose Injection What is this medication? IRON SUCROSE (EYE ern SOO krose) treats low levels of iron (iron deficiency anemia) in people with kidney disease. Iron is a mineral that plays an important role in making red blood cells, which carry oxygen from your lungs to the rest of your body. This medicine may be used for other purposes; ask your health care provider or pharmacist if you have questions. COMMON BRAND NAME(S): Venofer What should I tell my care team before I take this medication? They need to know if you have any of these conditions: Anemia not caused by low iron levels Heart disease High levels of iron in the blood Kidney disease Liver disease An unusual or allergic reaction to iron, other medications, foods, dyes, or preservatives Pregnant or trying to get pregnant Breastfeeding How should I use this medication? This medication is for infusion into a vein. It is given in a hospital or clinic setting. Talk to your care team about the use of this medication in children. While this medication may be prescribed for children as young as 2 years for selected conditions, precautions do apply. Overdosage: If you think you have taken too much of this medicine contact a poison control center or emergency room at once. NOTE: This medicine is only for you. Do not share this medicine with others. What if I miss a dose? Keep appointments for follow-up doses. It is important not to miss your dose. Call your care team if you are unable to keep an appointment. What may interact with this medication? Do not take this medication with any of the following: Deferoxamine Dimercaprol Other iron products This medication may also interact with the following: Chloramphenicol Deferasirox This list may not describe all possible interactions. Give your health care provider a list of all the medicines, herbs, non-prescription drugs, or dietary supplements you use. Also tell them if you smoke,  drink alcohol, or use illegal drugs. Some items may interact with your medicine. What should I watch for while using this medication? Visit your care team regularly. Tell your care team if your symptoms do not start to get better or if they get worse. You may need blood work done while you are taking this medication. You may need to follow a special diet. Talk to your care team. Foods that contain iron include: whole grains/cereals, dried fruits, beans, or peas, leafy green vegetables, and organ meats (liver, kidney). What side effects may I notice from receiving this medication? Side effects that you should report to your care team as soon as possible: Allergic reactions--skin rash, itching, hives, swelling of the face, lips, tongue, or throat Low blood pressure--dizziness, feeling faint or lightheaded, blurry vision Shortness of breath Side effects that usually do not require medical attention (report to your care team if they continue or are bothersome): Flushing Headache Joint pain Muscle pain Nausea Pain, redness, or irritation at injection site This list may not describe all possible side effects. Call your doctor for medical advice about side effects. You may report side effects to FDA at 1-800-FDA-1088. Where should I keep my medication? This medication is given in a hospital or clinic. It will not be stored at home. NOTE: This sheet is a summary. It may not cover all possible information. If you have questions about this medicine, talk to your doctor, pharmacist, or health care provider.  2024 Elsevier/Gold Standard (2023-02-26 00:00:00)

## 2023-06-18 LAB — CULTURE, URINE COMPREHENSIVE

## 2023-06-22 ENCOUNTER — Encounter: Payer: Self-pay | Admitting: Internal Medicine

## 2023-06-23 ENCOUNTER — Ambulatory Visit: Payer: Medicare Other | Admitting: Anesthesiology

## 2023-06-23 ENCOUNTER — Ambulatory Visit
Admission: RE | Admit: 2023-06-23 | Discharge: 2023-06-23 | Disposition: A | Discharge status: Home / Self Care | Payer: Medicare Other | Attending: Internal Medicine | Admitting: Internal Medicine

## 2023-06-23 ENCOUNTER — Encounter
Admission: RE | Disposition: A | Discharge status: Home / Self Care | Payer: Self-pay | Source: Home / Self Care | Attending: Internal Medicine

## 2023-06-23 ENCOUNTER — Encounter: Payer: Self-pay | Admitting: Internal Medicine

## 2023-06-23 DIAGNOSIS — K2970 Gastritis, unspecified, without bleeding: Secondary | ICD-10-CM | POA: Diagnosis not present

## 2023-06-23 DIAGNOSIS — G4733 Obstructive sleep apnea (adult) (pediatric): Secondary | ICD-10-CM | POA: Insufficient documentation

## 2023-06-23 DIAGNOSIS — R1310 Dysphagia, unspecified: Secondary | ICD-10-CM | POA: Insufficient documentation

## 2023-06-23 DIAGNOSIS — I129 Hypertensive chronic kidney disease with stage 1 through stage 4 chronic kidney disease, or unspecified chronic kidney disease: Secondary | ICD-10-CM | POA: Diagnosis not present

## 2023-06-23 DIAGNOSIS — D120 Benign neoplasm of cecum: Secondary | ICD-10-CM | POA: Diagnosis not present

## 2023-06-23 DIAGNOSIS — K219 Gastro-esophageal reflux disease without esophagitis: Secondary | ICD-10-CM | POA: Insufficient documentation

## 2023-06-23 DIAGNOSIS — Z86718 Personal history of other venous thrombosis and embolism: Secondary | ICD-10-CM | POA: Insufficient documentation

## 2023-06-23 DIAGNOSIS — E039 Hypothyroidism, unspecified: Secondary | ICD-10-CM | POA: Diagnosis not present

## 2023-06-23 DIAGNOSIS — E669 Obesity, unspecified: Secondary | ICD-10-CM | POA: Insufficient documentation

## 2023-06-23 DIAGNOSIS — K5730 Diverticulosis of large intestine without perforation or abscess without bleeding: Secondary | ICD-10-CM | POA: Diagnosis not present

## 2023-06-23 DIAGNOSIS — Z86010 Personal history of colonic polyps: Secondary | ICD-10-CM | POA: Insufficient documentation

## 2023-06-23 DIAGNOSIS — R7303 Prediabetes: Secondary | ICD-10-CM | POA: Insufficient documentation

## 2023-06-23 DIAGNOSIS — N189 Chronic kidney disease, unspecified: Secondary | ICD-10-CM | POA: Insufficient documentation

## 2023-06-23 DIAGNOSIS — K640 First degree hemorrhoids: Secondary | ICD-10-CM | POA: Diagnosis not present

## 2023-06-23 DIAGNOSIS — D124 Benign neoplasm of descending colon: Secondary | ICD-10-CM | POA: Insufficient documentation

## 2023-06-23 DIAGNOSIS — M797 Fibromyalgia: Secondary | ICD-10-CM | POA: Insufficient documentation

## 2023-06-23 DIAGNOSIS — K31A14 Gastric intestinal metaplasia without dysplasia, involving the cardia: Secondary | ICD-10-CM | POA: Diagnosis not present

## 2023-06-23 DIAGNOSIS — G894 Chronic pain syndrome: Secondary | ICD-10-CM | POA: Diagnosis not present

## 2023-06-23 DIAGNOSIS — K222 Esophageal obstruction: Secondary | ICD-10-CM | POA: Diagnosis not present

## 2023-06-23 DIAGNOSIS — D509 Iron deficiency anemia, unspecified: Secondary | ICD-10-CM | POA: Insufficient documentation

## 2023-06-23 DIAGNOSIS — K449 Diaphragmatic hernia without obstruction or gangrene: Secondary | ICD-10-CM | POA: Insufficient documentation

## 2023-06-23 DIAGNOSIS — Z6835 Body mass index (BMI) 35.0-35.9, adult: Secondary | ICD-10-CM | POA: Insufficient documentation

## 2023-06-23 HISTORY — PX: MALONEY DILATION: SHX5535

## 2023-06-23 HISTORY — PX: ESOPHAGOGASTRODUODENOSCOPY (EGD) WITH PROPOFOL: SHX5813

## 2023-06-23 HISTORY — PX: COLONOSCOPY WITH PROPOFOL: SHX5780

## 2023-06-23 HISTORY — PX: BIOPSY: SHX5522

## 2023-06-23 SURGERY — COLONOSCOPY WITH PROPOFOL
Anesthesia: General

## 2023-06-23 MED ORDER — PROPOFOL 1000 MG/100ML IV EMUL
INTRAVENOUS | Status: AC
  Filled 2023-06-23: qty 100

## 2023-06-23 MED ORDER — GLYCOPYRROLATE 0.2 MG/ML IJ SOLN
INTRAMUSCULAR | Status: DC | PRN
  Administered 2023-06-23: .2 mg via INTRAVENOUS

## 2023-06-23 MED ORDER — LIDOCAINE HCL (CARDIAC) PF 100 MG/5ML IV SOSY
PREFILLED_SYRINGE | INTRAVENOUS | Status: DC | PRN
  Administered 2023-06-23: 100 mg via INTRAVENOUS

## 2023-06-23 MED ORDER — PROPOFOL 500 MG/50ML IV EMUL
INTRAVENOUS | Status: DC | PRN
  Administered 2023-06-23: 150 ug/kg/min via INTRAVENOUS

## 2023-06-23 MED ORDER — ESMOLOL HCL 100 MG/10ML IV SOLN
INTRAVENOUS | Status: DC | PRN
Start: 2023-06-23 — End: 2023-06-23
  Administered 2023-06-23: 20 mg via INTRAVENOUS

## 2023-06-23 MED ORDER — SODIUM CHLORIDE 0.9 % IV SOLN: INTRAVENOUS | Status: DC

## 2023-06-23 MED ORDER — PROPOFOL 10 MG/ML IV BOLUS
INTRAVENOUS | Status: AC
  Filled 2023-06-23: qty 40

## 2023-06-23 MED ORDER — PROPOFOL 10 MG/ML IV BOLUS
INTRAVENOUS | Status: DC | PRN
Start: 2023-06-23 — End: 2023-06-23
  Administered 2023-06-23: 40 mg via INTRAVENOUS
  Administered 2023-06-23: 30 mg via INTRAVENOUS
  Administered 2023-06-23: 60 mg via INTRAVENOUS

## 2023-06-23 NOTE — Anesthesia Preprocedure Evaluation (Signed)
Anesthesia Evaluation  Patient identified by MRN, date of birth, ID band Patient awake    Reviewed: Allergy & Precautions, NPO status , Patient's Chart, lab work & pertinent test results  History of Anesthesia Complications (+) PROLONGED EMERGENCE and history of anesthetic complications  Airway Mallampati: III  TM Distance: >3 FB Neck ROM: full    Dental no notable dental hx.    Pulmonary sleep apnea and Continuous Positive Airway Pressure Ventilation , former smoker   Pulmonary exam normal        Cardiovascular hypertension, On Medications negative cardio ROS Normal cardiovascular exam     Neuro/Psych  PSYCHIATRIC DISORDERS Anxiety Depression     Neuromuscular disease    GI/Hepatic Neg liver ROS, PUD,GERD  Medicated,,  Endo/Other  Hypothyroidism    Renal/GU Renal disease  negative genitourinary   Musculoskeletal  (+) Arthritis ,  Fibromyalgia -  Abdominal   Peds  Hematology  (+) Blood dyscrasia, anemia   Anesthesia Other Findings Past Medical History: No date: Allergy     Comment:  seasonal No date: Arthritis No date: Cataract No date: Cerebral aneurysm No date: Chronic kidney disease No date: Chronic pain syndrome No date: Complication of anesthesia     Comment:  trouble waking up after back surgery No date: DDD (degenerative disc disease), lumbar No date: Depression 2002: DVT (deep venous thrombosis) (HCC) No date: Fibromyalgia No date: GERD (gastroesophageal reflux disease) No date: Heart murmur     Comment:  Heart dr said normal for me No date: History of kidney stones No date: Hyperlipidemia No date: Hypertension No date: Hypothyroidism No date: Incontinence in female 11/01/2019: Iron deficiency anemia No date: Irregular heart beat No date: Neuromuscular disorder (HCC)     Comment:  peripheral neuropathy/feet No date: Obesity No date: OSA on CPAP No date: Pancolitis (HCC) No date:  Pre-diabetes No date: RLS (restless legs syndrome) 11/2022: UTI (urinary tract infection)  Past Surgical History: 2002: BRAIN SURGERY     Comment:  aneurysm 2002: CEREBRAL ANEURYSM REPAIR 11/10/2019: COLONOSCOPY WITH PROPOFOL; N/A     Comment:  Procedure: COLONOSCOPY WITH PROPOFOL;  Surgeon: Wyline Mood, MD;  Location: [facility];  Service:               Endoscopy;  Laterality: N/A; No date: COSMETIC SURGERY 11/10/2019: ESOPHAGOGASTRODUODENOSCOPY (EGD) WITH PROPOFOL; N/A     Comment:  Procedure: ESOPHAGOGASTRODUODENOSCOPY (EGD) WITH               PROPOFOL;  Surgeon: Wyline Mood, MD;  Location: [facility];  Service: Endoscopy;  Laterality: N/A; 04/27/2019: EXTRACORPOREAL SHOCK WAVE LITHOTRIPSY; Right     Comment:  Procedure: EXTRACORPOREAL SHOCK WAVE LITHOTRIPSY (ESWL);              Surgeon: Sondra Come, MD;  Location: [facility] OR[facility];                Service: Urology;  Laterality: Right; No date: EYE SURGERY No date: JOINT REPLACEMENT; Bilateral     Comment:  knee 01/12/2020: KNEE ARTHROPLASTY; Left     Comment:  Procedure: COMPUTER ASSISTED TOTAL KNEE ARTHROPLASTY;                Surgeon: Donato Heinz, MD;  Location: [facility];  Service: Orthopedics;  Laterality: Left; 11/10/2019: POLYPECTOMY     Comment:  Procedure: POLYPECTOMY;  Surgeon: Wyline Mood, MD;                Location: [facility];  Service: Endoscopy;; 01/06/2022: REVERSE SHOULDER ARTHROPLASTY; Right     Comment:  Procedure: REVERSE SHOULDER ARTHROPLASTY;  Surgeon:               Christena Flake, MD;  Location: [facility];  Service:               Orthopedics;  Laterality: Right; 02/01/2023: SPINAL CORD STIMULATOR BATTERY EXCHANGE; N/A     Comment:  Procedure: INTERNAL PULSE GENERATOR REPLACEMENT ([place]               SCIENTIFIC);  Surgeon: Venetia Night, MD;  Location:              [facility];  Service: Neurosurgery;  Laterality: N/A;                 LOCAL WITH MAC 07/10/2019: SPINAL FUSION     Comment:  C4-5 corpectomy with C6-7 ACDF, C2-T3 spinal fusion No date: SPINE SURGERY     Comment:  spinal fusion 12/25/2022: THORACIC LAMINECTOMY FOR SPINAL CORD STIMULATOR; N/A     Comment:  Procedure: THORACIC LAMINECTOMY FOR SPINAL CORD               STIMULATOR IMPLANTATION ([place] SCIENTIFIC);  Surgeon:               Venetia Night, MD;  Location: [facility];  Service:               Neurosurgery;  Laterality: N/A; No date: TUBAL LIGATION  BMI    Body Mass Index: 35.43 kg/m      Reproductive/Obstetrics negative OB ROS                             Anesthesia Physical Anesthesia Plan  ASA: 3  Anesthesia Plan: General   Post-op Pain Management: Minimal or no pain anticipated   Induction: Intravenous  PONV Risk Score and Plan: 2 and Propofol infusion and TIVA  Airway Management Planned: Natural Airway and Nasal Cannula  Additional Equipment:   Intra-op Plan:   Post-operative Plan:   Informed Consent: I have reviewed the patients History and Physical, chart, labs and discussed the procedure including the risks, benefits and alternatives for the proposed anesthesia with the patient or authorized representative who has indicated his/her understanding and acceptance.     Dental Advisory Given  Plan Discussed with: Anesthesiologist, CRNA and Surgeon  Anesthesia Plan Comments: (Patient consented for risks of anesthesia including but not limited to:  - adverse reactions to medications - risk of airway placement if required - damage to eyes, teeth, lips or other oral mucosa - nerve damage due to positioning  - sore throat or hoarseness - Damage to heart, brain, nerves, lungs, other parts of body or loss of life  Patient voiced understanding.)       Anesthesia Quick Evaluation

## 2023-06-23 NOTE — Transfer of Care (Signed)
Immediate Anesthesia Transfer of Care Note  Patient: Lisa Good  Procedure(s) Performed: COLONOSCOPY WITH PROPOFOL ESOPHAGOGASTRODUODENOSCOPY (EGD) WITH PROPOFOL BIOPSY MALONEY DILATION  Patient Location: PACU  Anesthesia Type:MAC  Level of Consciousness: drowsy  Airway & Oxygen Therapy: Patient Spontanous Breathing and Patient connected to face mask oxygen  Post-op Assessment: Report given to RN and Post -op Vital signs reviewed and stable  Post vital signs: Reviewed  Last Vitals:  Vitals Value Taken Time  BP 138/57 06/23/23 1203  Temp 35.7 C 06/23/23 1203  Pulse 106 06/23/23 1206  Resp 15 06/23/23 1206  SpO2 97 % 06/23/23 1206  Vitals shown include unfiled device data.  Last Pain:  Vitals:   06/23/23 1203  TempSrc: Temporal  PainSc: 0-No pain         Complications: No notable events documented.

## 2023-06-23 NOTE — H&P (Signed)
Outpatient short stay form Pre-procedure 06/23/2023 10:23 AM Randy Castrejon K. Norma Fredrickson, M.D.  Primary Physician: Maurine Minister, M.D.  Reason for visit:  Dysphagia, iron deficiency anemia, gastric intestinal metaplasia, hx tubular adenomas of the colon.  History of present illness:  Last colonoscopy was performed and February 2021 by Dr. Leory Plowman revealing solitary tubular adenoma and a small ulcer at the splenic flexure. Endoscopy also performed in February 2021 revealed gastric intestinal metaplasia and benign gastric fundic polyp. Patient has chronic neck, back and shoulder pain and has undergone surgery on her neck and back. She undergoes regular pain injections on her right shoulder.     Current Facility-Administered Medications:    0.9 %  sodium chloride infusion, , Intravenous, Continuous, Sirr Kabel, Boykin Nearing, MD  Medications Prior to Admission  Medication Sig Dispense Refill Last Dose   amLODipine (NORVASC) 10 MG tablet Take 10 mg by mouth at bedtime.   06/22/2023   lisinopril (PRINIVIL,ZESTRIL) 40 MG tablet Take 40 mg by mouth at bedtime.   06/22/2023   albuterol (VENTOLIN HFA) 108 (90 Base) MCG/ACT inhaler Inhale 1-2 puffs into the lungs every 6 (six) hours as needed for wheezing or shortness of breath. 1 each 0    Alpha-Lipoic Acid 600 MG CAPS Take 600 mg by mouth 2 (two) times daily.      methocarbamol (ROBAXIN) 500 MG tablet Take 1 tablet (500 mg total) by mouth 4 (four) times daily. 120 tablet 0    oxyCODONE-acetaminophen (PERCOCET) 10-325 MG tablet Take 1 tablet by mouth 4 (four) times daily.      pantoprazole (PROTONIX) 40 MG tablet Take 40 mg by mouth every morning.      trimethoprim (TRIMPEX) 100 MG tablet Take 1 tablet (100 mg total) by mouth daily. 90 tablet 3    venlafaxine XR (EFFEXOR-XR) 150 MG 24 hr capsule Take 150 mg by mouth at bedtime. Take with 75 mg to equal 225 mg daily      venlafaxine XR (EFFEXOR-XR) 75 MG 24 hr capsule Take 75 mg by mouth at bedtime. Take with  150 mg to equal 225 mg daily      XYLITOL MT Use as directed 2 tablets in the mouth or throat daily as needed (dry mouth).        Allergies  Allergen Reactions   Contrast Media [Iodinated Contrast Media] Hives   Duloxetine Swelling    Legs and feet   Pregabalin Swelling    Legs and feet   Iodine Hives   Clarithromycin Nausea Only   Spironolactone-Hctz Rash    Only with iv injection     Past Medical History:  Diagnosis Date   Allergy    seasonal   Arthritis    Cataract    Cerebral aneurysm    Chronic kidney disease    Chronic pain syndrome    Complication of anesthesia    trouble waking up after back surgery   DDD (degenerative disc disease), lumbar    Depression    DVT (deep venous thrombosis) (HCC) 2002   Fibromyalgia    GERD (gastroesophageal reflux disease)    Heart murmur    Heart dr said normal for me   History of kidney stones    Hyperlipidemia    Hypertension    Hypothyroidism    Incontinence in female    Iron deficiency anemia 11/01/2019   Irregular heart beat    Neuromuscular disorder (HCC)    peripheral neuropathy/feet   Obesity    OSA on CPAP  Pancolitis (HCC)    Pre-diabetes    RLS (restless legs syndrome)    UTI (urinary tract infection) 11/2022    Review of systems:  Otherwise negative.    Physical Exam  Gen: Alert, oriented. Appears stated age.  HEENT: [place]/AT. PERRLA. Lungs: CTA, no wheezes. CV: RR nl S1, S2. Abd: soft, benign, no masses. BS+ Ext: No edema. Pulses 2+    Planned procedures: Proceed with EGD and colonoscopy. The patient understands the nature of the planned procedure, indications, risks, alternatives and potential complications including but not limited to bleeding, infection, perforation, damage to internal organs and possible oversedation/side effects from anesthesia. The patient agrees and gives consent to proceed.  Please refer to procedure notes for findings, recommendations and patient disposition/instructions.      Romond Pipkins K. Norma Fredrickson, M.D. Gastroenterology 06/23/2023  10:23 AM

## 2023-06-23 NOTE — Op Note (Addendum)
[facility] Patient Name: Lisa Good Procedure Date: 06/23/2023 11:04 AM MRN: [id number] Account #: [id number] Date of Birth: 03/07/1951 Admit Type: Outpatient Age: 72 Room: [facility] ENDO ROOM 2 Gender: Female Note Status: Finalized Instrument Name: Upper Endoscope [id number] Procedure:             Upper GI endoscopy Indications:           Unexplained iron deficiency anemia, Dysphagia,                         Gastro-esophageal reflux disease Providers:             Boykin Nearing. Norma Fredrickson MD, MD Referring MD:          Marilynne Halsted, MD (Referring MD) Medicines:             Propofol per Anesthesia Complications:         No immediate complications. Estimated blood loss:                         Minimal. Procedure:             Pre-Anesthesia Assessment:                        - The risks and benefits of the procedure and the                         sedation options and risks were discussed with the                         patient. All questions were answered and informed                         consent was obtained.                        - Patient identification and proposed procedure were                         verified prior to the procedure by the nurse. The                         procedure was verified in the procedure room.                        - ASA Grade Assessment: III - A patient with severe                         systemic disease.                        - After reviewing the risks and benefits, the patient                         was deemed in satisfactory condition to undergo the                         procedure.                        After obtaining informed consent,  the endoscope was                         passed under direct vision. Throughout the procedure,                         the patient's blood pressure, pulse, and oxygen                         saturations were monitored continuously. The Endoscope                          was introduced through the mouth, and advanced to the                         third part of duodenum. The upper GI endoscopy was                         accomplished without difficulty. The patient tolerated                         the procedure well. Findings:      The examined duodenum was normal.      Patchy mild inflammation characterized by congestion (edema) and       erythema was found in the gastric antrum.      A 2 cm hiatal hernia was present.      The exam was otherwise without abnormality.      Biopsy mapping performed with biopsies obtained from the prepyloric       stomach (lesser and greater curvature), incisural angularis and body       (lesser and greater curvature). 5 biopsies total submitted in one jar.      A single 12 mm mucosal nodule with a localized distribution was found at       the gastroesophageal junction. Biopsies were taken with a cold forceps       for histology.      A mild Schatzki ring was found in the lower third of the esophagus. The       scope was withdrawn. Dilation was performed with a Maloney dilator with       no resistance at 54 Fr.      The exam of the esophagus was otherwise normal. Impression:            - Normal examined duodenum.                        - Gastritis.                        - 2 cm hiatal hernia.                        - The examination was otherwise normal.                        - Mucosal nodule found in the esophagus. Biopsied.                        - Mild Schatzki ring. Dilated. Recommendation:        - Await pathology results.                        -  Monitor results to esophageal dilation                        - Proceed with colonoscopy Procedure Code(s):     --- Professional ---                        [phone], Esophagogastroduodenoscopy, flexible,                         transoral; with biopsy, single or multiple                        43450, Dilation of esophagus, by unguided sound or                          bougie, single or multiple passes Diagnosis Code(s):     --- Professional ---                        K21.9, Gastro-esophageal reflux disease without                         esophagitis                        R13.10, Dysphagia, unspecified                        D50.9, Iron deficiency anemia, unspecified                        K22.2, Esophageal obstruction                        K22.89, Other specified disease of esophagus                        K44.9, Diaphragmatic hernia without obstruction or                         gangrene                        K29.70, Gastritis, unspecified, without bleeding CPT copyright 2022 American Medical Association. All rights reserved. The codes documented in this report are preliminary and upon coder review may  be revised to meet current compliance requirements. Stanton Kidney MD, MD 06/23/2023 11:34:38 AM This report has been signed electronically. Number of Addenda: 0 Note Initiated On: 06/23/2023 11:04 AM Estimated Blood Loss:  Estimated blood loss was minimal.      [employer]

## 2023-06-23 NOTE — Anesthesia Postprocedure Evaluation (Signed)
Anesthesia Post Note  Patient: Shealee Pursel  Procedure(s) Performed: COLONOSCOPY WITH PROPOFOL ESOPHAGOGASTRODUODENOSCOPY (EGD) WITH PROPOFOL BIOPSY MALONEY DILATION  Patient location during evaluation: Endoscopy Anesthesia Type: General Level of consciousness: awake and alert Pain management: pain level controlled Vital Signs Assessment: post-procedure vital signs reviewed and stable Respiratory status: spontaneous breathing, nonlabored ventilation, respiratory function stable and patient connected to nasal cannula oxygen Cardiovascular status: blood pressure returned to baseline and stable Postop Assessment: no apparent nausea or vomiting Anesthetic complications: no   No notable events documented.   Last Vitals:  Vitals:   06/23/23 1203 06/23/23 1213  BP: (!) 138/57 (!) 110/53  Pulse:    Resp:    Temp: (!) 35.7 C   SpO2:      Last Pain:  Vitals:   06/23/23 1223  TempSrc:   PainSc: 0-No pain                 Louie Boston

## 2023-06-23 NOTE — Op Note (Addendum)
[facility] Gastroenterology Patient Name: Lisa Good Procedure Date: 06/23/2023 11:03 AM MRN: [id number] Account #: [id number] Date of Birth: [birth year] Admit Type: Outpatient Age: 72 Room: [facility] ENDO ROOM 2 Gender: Female Note Status: Supervisor Override Instrument Name: Prentice Docker [id number] Procedure:             Colonoscopy Indications:           Unexplained iron deficiency anemia, Follow-up for                         history of adenomatous polyps in the colon Providers:             Boykin Nearing. Norma Fredrickson MD, MD Referring MD:          Marilynne Halsted, MD (Referring MD) Medicines:             Propofol per Anesthesia Complications:         No immediate complications. Procedure:             Pre-Anesthesia Assessment:                        - The risks and benefits of the procedure and the                         sedation options and risks were discussed with the                         patient. All questions were answered and informed                         consent was obtained.                        - Patient identification and proposed procedure were                         verified prior to the procedure by the nurse. The                         procedure was verified in the procedure room.                        - ASA Grade Assessment: III - A patient with severe                         systemic disease.                        - After reviewing the risks and benefits, the patient                         was deemed in satisfactory condition to undergo the                         procedure.                        After obtaining informed consent, the colonoscope was  passed under direct vision. Throughout the procedure,                         the patient's blood pressure, pulse, and oxygen                         saturations were monitored continuously. The                         Colonoscope was introduced through the anus and                          advanced to the the cecum, identified by appendiceal                         orifice and ileocecal valve. The patient tolerated the                         procedure well. The quality of the bowel preparation                         was good. The ileocecal valve, appendiceal orifice,                         and rectum were photographed. The colonoscopy was                         somewhat difficult due to significant looping.                         Successful completion of the procedure was aided by                         applying abdominal pressure. Findings:      The perianal and digital rectal examinations were normal. Pertinent       negatives include normal sphincter tone and no palpable rectal lesions.      Non-bleeding internal hemorrhoids were found during retroflexion. The       hemorrhoids were Grade I (internal hemorrhoids that do not prolapse).      A few medium-mouthed diverticula were found in the sigmoid colon.      An 8 mm polyp was found in the ileocecal valve. The polyp was sessile.       The polyp was removed with a cold snare. Resection and retrieval were       complete.      A 4 mm polyp was found in the descending colon. The polyp was sessile.       The polyp was removed with a jumbo cold forceps. Resection and retrieval       were complete.      The exam was otherwise without abnormality. Impression:            - Non-bleeding internal hemorrhoids.                        - Diverticulosis in the sigmoid colon.                        - One 8 mm polyp at the ileocecal valve, removed with  a cold snare. Resected and retrieved.                        - One 4 mm polyp in the descending colon, removed with                         a jumbo cold forceps. Resected and retrieved.                        - The examination was otherwise normal. Recommendation:        - Await pathology results from EGD, also performed                          today.                        - Monitor results to esophageal dilation                        - Patient has a contact number available for                         emergencies. The signs and symptoms of potential                         delayed complications were discussed with the patient.                         Return to normal activities tomorrow. Written                         discharge instructions were provided to the patient.                        - Resume previous diet.                        - Continue present medications.                        - Await pathology results.                        - Repeat colonoscopy for surveillance based on                         pathology results.                        - Return to GI office in 3 months.                        - Telephone GI office to schedule appointment.                        - The findings and recommendations were discussed with                         the patient. Procedure Code(s):     --- Professional ---  57846, Colonoscopy, flexible; with removal of                         tumor(s), polyp(s), or other lesion(s) by snare                         technique                        45380, 59, Colonoscopy, flexible; with biopsy, single                         or multiple Diagnosis Code(s):     --- Professional ---                        K57.30, Diverticulosis of large intestine without                         perforation or abscess without bleeding                        D50.9, Iron deficiency anemia, unspecified                        D12.4, Benign neoplasm of descending colon                        D12.0, Benign neoplasm of cecum                        K64.0, First degree hemorrhoids CPT copyright 2022 [employer]. All rights reserved. The codes documented in this report are preliminary and upon coder review may  be revised to meet current compliance requirements. Stanton Kidney MD, MD 06/23/2023 12:46:58 PM This report has been signed electronically. Number of Addenda: 0 Note Initiated On: 06/23/2023 11:03 AM Scope Withdrawal Time: 0 hours 9 minutes 36 seconds  Total Procedure Duration: 0 hours 21 minutes 27 seconds  Estimated Blood Loss:  Estimated blood loss: none. Estimated blood loss: none.      [facility]

## 2023-06-24 ENCOUNTER — Encounter: Payer: Medicare Other | Admitting: Physical Medicine & Rehabilitation

## 2023-06-24 ENCOUNTER — Encounter: Payer: Self-pay | Admitting: Internal Medicine

## 2023-06-25 ENCOUNTER — Other Ambulatory Visit: Payer: Self-pay

## 2023-06-25 DIAGNOSIS — D500 Iron deficiency anemia secondary to blood loss (chronic): Secondary | ICD-10-CM

## 2023-06-28 ENCOUNTER — Inpatient Hospital Stay (HOSPITAL_BASED_OUTPATIENT_CLINIC_OR_DEPARTMENT_OTHER): Payer: Medicare Other | Admitting: Medical Oncology

## 2023-06-28 ENCOUNTER — Encounter: Payer: Self-pay | Admitting: Medical Oncology

## 2023-06-28 ENCOUNTER — Inpatient Hospital Stay: Payer: Medicare Other

## 2023-06-28 VITALS — BP 131/67 | HR 90 | Temp 98.3°F | Resp 18 | Wt 209.1 lb

## 2023-06-28 DIAGNOSIS — D500 Iron deficiency anemia secondary to blood loss (chronic): Secondary | ICD-10-CM | POA: Diagnosis not present

## 2023-06-28 DIAGNOSIS — Z8719 Personal history of other diseases of the digestive system: Secondary | ICD-10-CM | POA: Diagnosis not present

## 2023-06-28 DIAGNOSIS — R7989 Other specified abnormal findings of blood chemistry: Secondary | ICD-10-CM

## 2023-06-28 LAB — FERRITIN: Ferritin: 78 ng/mL (ref 11–307)

## 2023-06-28 LAB — CBC WITH DIFFERENTIAL (CANCER CENTER ONLY)
Abs Immature Granulocytes: 0.02 10*3/uL (ref 0.00–0.07)
Basophils Absolute: 0.1 10*3/uL (ref 0.0–0.1)
Basophils Relative: 1 %
Eosinophils Absolute: 0.2 10*3/uL (ref 0.0–0.5)
Eosinophils Relative: 4 %
HCT: 36.5 % (ref 36.0–46.0)
Hemoglobin: 10.9 g/dL — ABNORMAL LOW (ref 12.0–15.0)
Immature Granulocytes: 0 %
Lymphocytes Relative: 20 %
Lymphs Abs: 1.1 10*3/uL (ref 0.7–4.0)
MCH: 25.6 pg — ABNORMAL LOW (ref 26.0–34.0)
MCHC: 29.9 g/dL — ABNORMAL LOW (ref 30.0–36.0)
MCV: 85.7 fL (ref 80.0–100.0)
Monocytes Absolute: 0.4 10*3/uL (ref 0.1–1.0)
Monocytes Relative: 8 %
Neutro Abs: 3.6 10*3/uL (ref 1.7–7.7)
Neutrophils Relative %: 67 %
Platelet Count: 206 10*3/uL (ref 150–400)
RBC: 4.26 MIL/uL (ref 3.87–5.11)
RDW: 21.5 % — ABNORMAL HIGH (ref 11.5–15.5)
WBC Count: 5.4 10*3/uL (ref 4.0–10.5)
nRBC: 0 % (ref 0.0–0.2)

## 2023-06-28 LAB — CMP (CANCER CENTER ONLY)
ALT: 7 U/L (ref 0–44)
AST: 14 U/L — ABNORMAL LOW (ref 15–41)
Albumin: 4 g/dL (ref 3.5–5.0)
Alkaline Phosphatase: 86 U/L (ref 38–126)
Anion gap: 6 (ref 5–15)
BUN: 17 mg/dL (ref 8–23)
CO2: 32 mmol/L (ref 22–32)
Calcium: 9.3 mg/dL (ref 8.9–10.3)
Chloride: 104 mmol/L (ref 98–111)
Creatinine: 1.01 mg/dL — ABNORMAL HIGH (ref 0.44–1.00)
GFR, Estimated: 59 mL/min — ABNORMAL LOW (ref >60)
Glucose, Bld: 95 mg/dL (ref 70–99)
Potassium: 4.2 mmol/L (ref 3.5–5.1)
Sodium: 142 mmol/L (ref 135–145)
Total Bilirubin: 0.3 mg/dL (ref 0.3–1.2)
Total Protein: 6.4 g/dL — ABNORMAL LOW (ref 6.5–8.1)

## 2023-06-28 LAB — RETICULOCYTES
Immature Retic Fract: 20.2 % — ABNORMAL HIGH (ref 2.3–15.9)
RBC.: 4.23 MIL/uL (ref 3.87–5.11)
Retic Count, Absolute: 79.9 10*3/uL (ref 19.0–186.0)
Retic Ct Pct: 1.9 % (ref 0.4–3.1)

## 2023-06-28 LAB — IRON AND IRON BINDING CAPACITY (CC-WL,HP ONLY)
Iron: 44 ug/dL (ref 28–170)
Saturation Ratios: 13 % (ref 10.4–31.8)
TIBC: 347 ug/dL (ref 250–450)
UIBC: 303 ug/dL (ref 148–442)

## 2023-06-28 NOTE — Progress Notes (Signed)
Hematology and Oncology Follow Up Visit  Lisa Good [id number] 1951/07/22 72 y.o. 06/28/2023  Past Medical History:  Diagnosis Date   Allergy    seasonal   Arthritis    Cataract    Cerebral aneurysm    Chronic kidney disease    Chronic pain syndrome    Complication of anesthesia    trouble waking up after back surgery   DDD (degenerative disc disease), lumbar    Depression    DVT (deep venous thrombosis) (HCC) 2002   Fibromyalgia    GERD (gastroesophageal reflux disease)    Heart murmur    Heart dr said normal for me   History of kidney stones    Hyperlipidemia    Hypertension    Hypothyroidism    Incontinence in female    Iron deficiency anemia 11/01/2019   Irregular heart beat    Neuromuscular disorder (HCC)    peripheral neuropathy/feet   Obesity    OSA on CPAP    Pancolitis (HCC)    Pre-diabetes    RLS (restless legs syndrome)    UTI (urinary tract infection) 11/2022    Principle Diagnosis:  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 CKD  Current Therapy:   Venofer 300 mg - last      Interim History:  Lisa Good is back for follow-up for her IDA. She was first seen by our office on 05/13/2023. Labs from 03/29/2023 show an iron of 19, iron saturation of 4%, ferritin of 7 on 03/18/2023. Last CBC on 03/30/2023 showed a hemoglobin of 8.2, MCV of 80.6. She was started on Venofer 300 mg weekly x 3 weeks total. She is s/p Venofer on 05/20/2023, 05/27/2023, 06/17/2023. She is here today for repeat labs and consideration of additional Venofer. She has tolerated the Venofer well.   Her symptoms of SOB is improved. Pica is improved. Fatigue is stable. She does not sleep well.   There has been no bleeding to her knowledge: denies epistaxis, gingivitis, hemoptysis, hematemesis, hematuria, melena, excessive bruising, blood donation.   She had an endoscopy/colonoscopy on 06/13/2023. Two polyps were collected that were benign. No bleeding was found.      Wt Readings from Last 3  Encounters:  06/28/23 209 lb 1.9 oz (94.9 kg)  06/23/23 200 lb (90.7 kg)  06/14/23 200 lb (90.7 kg)     Medications:   Current Outpatient Medications:    albuterol (VENTOLIN HFA) 108 (90 Base) MCG/ACT inhaler, Inhale 1-2 puffs into the lungs every 6 (six) hours as needed for wheezing or shortness of breath., Disp: 1 each, Rfl: 0   Alpha-Lipoic Acid 600 MG CAPS, Take 600 mg by mouth 2 (two) times daily., Disp: , Rfl:    amLODipine (NORVASC) 10 MG tablet, Take 10 mg by mouth at bedtime., Disp: , Rfl:    docusate sodium (COLACE) 50 MG capsule, Take 50 mg by mouth daily as needed., Disp: , Rfl:    gabapentin (NEURONTIN) 800 MG tablet, Take 1 tablet by mouth 4 (four) times daily., Disp: , Rfl:    lisinopril (PRINIVIL,ZESTRIL) 40 MG tablet, Take 40 mg by mouth at bedtime., Disp: , Rfl:    methocarbamol (ROBAXIN) 500 MG tablet, Take 1 tablet (500 mg total) by mouth 4 (four) times daily., Disp: 120 tablet, Rfl: 0   nystatin-triamcinolone (MYCOLOG II) cream, Apply 1 Application topically as needed (under breast)., Disp: , Rfl:    oxyCODONE-acetaminophen (PERCOCET) 10-325 MG tablet, Take 1 tablet by mouth 4 (four) times daily., Disp: , Rfl:  pantoprazole (PROTONIX) 40 MG tablet, Take 40 mg by mouth every morning., Disp: , Rfl:    traZODone (DESYREL) 100 MG tablet, Take 100 mg by mouth at bedtime., Disp: , Rfl:    trimethoprim (TRIMPEX) 100 MG tablet, Take 1 tablet (100 mg total) by mouth daily., Disp: 90 tablet, Rfl: 3   venlafaxine XR (EFFEXOR-XR) 150 MG 24 hr capsule, Take 150 mg by mouth at bedtime. Take with 75 mg to equal 225 mg daily, Disp: , Rfl:    venlafaxine XR (EFFEXOR-XR) 75 MG 24 hr capsule, Take 75 mg by mouth at bedtime. Take with 150 mg to equal 225 mg daily, Disp: , Rfl:    XYLITOL MT, Use as directed 2 tablets in the mouth or throat daily as needed (dry mouth)., Disp: , Rfl:    amoxicillin (AMOXIL) 500 MG capsule, Take 4 capsules by mouth as directed. Take 4 capsules 1 hour prior to  dental procedures. (Patient not taking: Reported on 06/28/2023), Disp: , Rfl:   Allergies:  Allergies  Allergen Reactions   Contrast Media [Iodinated Contrast Media] Hives   Duloxetine Swelling    Legs and feet   Pregabalin Swelling    Legs and feet   Iodine Hives   Clarithromycin Nausea Only   Spironolactone-Hctz Rash    Only with iv injection    Past Medical History, Surgical history, Social history, and Family History were reviewed and updated.  Review of Systems: As stated above in HPI   Physical Exam:  weight is 209 lb 1.9 oz (94.9 kg). Her oral temperature is 98.3 F (36.8 C). Her blood pressure is 131/67 and her pulse is 90. Her respiration is 18 and oxygen saturation is 93%.   Physical Exam General: NAD. Uses a cane to ambulate  Cardiovascular: regular rate and rhythm Pulmonary: clear ant fields Abdomen: soft, nontender, + bowel sounds GU: no suprapubic tenderness Extremities: no edema, no joint deformities Skin: no rashes Neurological: Weakness but otherwise nonfocal   Lab Results  Component Value Date   WBC 5.4 06/28/2023   HGB 10.9 (L) 06/28/2023   HCT 36.5 06/28/2023   MCV 85.7 06/28/2023   PLT 206 06/28/2023     Chemistry      Component Value Date/Time   NA 142 06/28/2023 0918   K 4.2 06/28/2023 0918   CL 104 06/28/2023 0918   CO2 32 06/28/2023 0918   BUN 17 06/28/2023 0918   CREATININE 1.01 (H) 06/28/2023 0918      Component Value Date/Time   CALCIUM 9.3 06/28/2023 0918   ALKPHOS 86 06/28/2023 0918   AST 14 (L) 06/28/2023 0918   ALT 7 06/28/2023 0918   BILITOT 0.3 06/28/2023 0918     No diagnosis found.   Assessment and Plan- Patient is a 72 y.o. 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 Currently s/p Venofer 300 mg on 05/20/2023.   CBC has improved. Suspect she may need additional iron based off of reticulocytes. Will supplement if needed. Glad to hear that her endo/colo went well. Path report pending at this time.    Disposition: RTC 1 months me, labs (CBC w/, CMP, iron, ferritin, retic, erythrop) -[place]   Clent Jacks PA-C 9/23/202410:05 AM

## 2023-07-01 LAB — SURGICAL PATHOLOGY

## 2023-07-06 ENCOUNTER — Ambulatory Visit (INDEPENDENT_AMBULATORY_CARE_PROVIDER_SITE_OTHER): Payer: Medicare Other | Admitting: Urology

## 2023-07-06 DIAGNOSIS — N3946 Mixed incontinence: Secondary | ICD-10-CM | POA: Diagnosis not present

## 2023-07-06 NOTE — Progress Notes (Signed)
PTNS  Session # 1   Health & Social Factors: No change Caffeine: 0 Alcohol: 0 Daytime voids #per day: 5-10 Night-time voids #per night: 3-4 Urgency: Mild Incontinence Episodes #per day: 0 Ankle used: Left Treatment Setting: 2 Feeling/ Response: sensation  Comments: patient tolerated the procedure  Performed By: Michiel Cowboy, PA-C   Follow Up: one week for PTNS #2

## 2023-07-12 NOTE — Progress Notes (Unsigned)
PTNS  Session # 2  Health & Social Factors: No Change Caffeine: 0 Alcohol: 0 Daytime voids #per day: 8 Night-time voids #per night: 4-5 Urgency: Severe  Incontinence Episodes #per day: all day Ankle used: Left Treatment Setting: 4 Feeling/ Response: Sensory  Comments: Patient tolerated the procedure.   Performed By: Michiel Cowboy, PA-C   Follow Up: One week for #3/12 PTNS

## 2023-07-13 ENCOUNTER — Ambulatory Visit (INDEPENDENT_AMBULATORY_CARE_PROVIDER_SITE_OTHER): Payer: Medicare Other | Admitting: Urology

## 2023-07-13 DIAGNOSIS — N3946 Mixed incontinence: Secondary | ICD-10-CM

## 2023-07-13 NOTE — Patient Instructions (Signed)

## 2023-07-19 NOTE — Progress Notes (Unsigned)
PTNS  Session # 3  Health & Social Factors: 72 year old mother just moved in with her Caffeine: 1 Alcohol: 0 Daytime voids #per day: 6 Night-time voids #per night: 3 Urgency: mild  Incontinence Episodes #per day: 2 Ankle used: Right  Treatment Setting: 9 Feeling/ Response: Sensory  Comments: Patient tolerated procedure well  Performed By: Michiel Cowboy, PA-C   Follow Up: Follow up in one week for #4/12

## 2023-07-20 ENCOUNTER — Ambulatory Visit (INDEPENDENT_AMBULATORY_CARE_PROVIDER_SITE_OTHER): Payer: Medicare Other | Admitting: Urology

## 2023-07-20 DIAGNOSIS — F1199 Opioid use, unspecified with unspecified opioid-induced disorder: Secondary | ICD-10-CM | POA: Insufficient documentation

## 2023-07-20 DIAGNOSIS — N3946 Mixed incontinence: Secondary | ICD-10-CM

## 2023-07-20 NOTE — Patient Instructions (Signed)

## 2023-07-26 NOTE — Progress Notes (Unsigned)
PTNS  Session # 4  Health & Social Factors: No  Caffeine: No  Alcohol: No  Daytime voids #per day: 6 Night-time voids #per night: 3 Urgency: Mild Incontinence Episodes #per day: 2 Ankle used: Left Treatment Setting: 12 Feeling/ Response: Sensory  Comments: Patient tolerated.   Performed By: Michiel Cowboy, PA-C   Follow Up: One week for #5 PTNS

## 2023-07-27 ENCOUNTER — Ambulatory Visit (INDEPENDENT_AMBULATORY_CARE_PROVIDER_SITE_OTHER): Payer: Medicare Other | Admitting: Urology

## 2023-07-27 DIAGNOSIS — N3946 Mixed incontinence: Secondary | ICD-10-CM

## 2023-07-27 NOTE — Patient Instructions (Signed)

## 2023-07-28 ENCOUNTER — Inpatient Hospital Stay: Payer: Medicare Other | Attending: Medical Oncology

## 2023-07-28 ENCOUNTER — Inpatient Hospital Stay (HOSPITAL_BASED_OUTPATIENT_CLINIC_OR_DEPARTMENT_OTHER): Payer: Medicare Other | Admitting: Medical Oncology

## 2023-07-28 ENCOUNTER — Encounter: Payer: Self-pay | Admitting: Medical Oncology

## 2023-07-28 VITALS — BP 160/84 | HR 92 | Temp 99.2°F | Resp 18 | Wt 209.0 lb

## 2023-07-28 DIAGNOSIS — D500 Iron deficiency anemia secondary to blood loss (chronic): Secondary | ICD-10-CM

## 2023-07-28 DIAGNOSIS — I129 Hypertensive chronic kidney disease with stage 1 through stage 4 chronic kidney disease, or unspecified chronic kidney disease: Secondary | ICD-10-CM | POA: Insufficient documentation

## 2023-07-28 DIAGNOSIS — N189 Chronic kidney disease, unspecified: Secondary | ICD-10-CM | POA: Insufficient documentation

## 2023-07-28 DIAGNOSIS — E538 Deficiency of other specified B group vitamins: Secondary | ICD-10-CM | POA: Diagnosis not present

## 2023-07-28 DIAGNOSIS — K922 Gastrointestinal hemorrhage, unspecified: Secondary | ICD-10-CM | POA: Insufficient documentation

## 2023-07-28 DIAGNOSIS — Z8719 Personal history of other diseases of the digestive system: Secondary | ICD-10-CM

## 2023-07-28 DIAGNOSIS — R7989 Other specified abnormal findings of blood chemistry: Secondary | ICD-10-CM

## 2023-07-28 LAB — CBC WITH DIFFERENTIAL (CANCER CENTER ONLY)
Abs Immature Granulocytes: 0.01 10*3/uL (ref 0.00–0.07)
Basophils Absolute: 0.1 10*3/uL (ref 0.0–0.1)
Basophils Relative: 1 %
Eosinophils Absolute: 0.2 10*3/uL (ref 0.0–0.5)
Eosinophils Relative: 5 %
HCT: 37.3 % (ref 36.0–46.0)
Hemoglobin: 11.2 g/dL — ABNORMAL LOW (ref 12.0–15.0)
Immature Granulocytes: 0 %
Lymphocytes Relative: 29 %
Lymphs Abs: 1.2 10*3/uL (ref 0.7–4.0)
MCH: 26.4 pg (ref 26.0–34.0)
MCHC: 30 g/dL (ref 30.0–36.0)
MCV: 88 fL (ref 80.0–100.0)
Monocytes Absolute: 0.4 10*3/uL (ref 0.1–1.0)
Monocytes Relative: 10 %
Neutro Abs: 2.3 10*3/uL (ref 1.7–7.7)
Neutrophils Relative %: 55 %
Platelet Count: 211 10*3/uL (ref 150–400)
RBC: 4.24 MIL/uL (ref 3.87–5.11)
RDW: 18.3 % — ABNORMAL HIGH (ref 11.5–15.5)
WBC Count: 4.2 10*3/uL (ref 4.0–10.5)
nRBC: 0 % (ref 0.0–0.2)

## 2023-07-28 LAB — CMP (CANCER CENTER ONLY)
ALT: 7 U/L (ref 0–44)
AST: 14 U/L — ABNORMAL LOW (ref 15–41)
Albumin: 4.3 g/dL (ref 3.5–5.0)
Alkaline Phosphatase: 83 U/L (ref 38–126)
Anion gap: 7 (ref 5–15)
BUN: 19 mg/dL (ref 8–23)
CO2: 34 mmol/L — ABNORMAL HIGH (ref 22–32)
Calcium: 9.9 mg/dL (ref 8.9–10.3)
Chloride: 106 mmol/L (ref 98–111)
Creatinine: 1.32 mg/dL — ABNORMAL HIGH (ref 0.44–1.00)
GFR, Estimated: 43 mL/min — ABNORMAL LOW (ref >60)
Glucose, Bld: 97 mg/dL (ref 70–99)
Potassium: 4.9 mmol/L (ref 3.5–5.1)
Sodium: 147 mmol/L — ABNORMAL HIGH (ref 135–145)
Total Bilirubin: 0.3 mg/dL (ref 0.3–1.2)
Total Protein: 6.5 g/dL (ref 6.5–8.1)

## 2023-07-28 LAB — FERRITIN: Ferritin: 25 ng/mL (ref 11–307)

## 2023-07-28 LAB — RETICULOCYTES
Immature Retic Fract: 10.5 % (ref 2.3–15.9)
RBC.: 4.21 MIL/uL (ref 3.87–5.11)
Retic Count, Absolute: 41.3 10*3/uL (ref 19.0–186.0)
Retic Ct Pct: 1 % (ref 0.4–3.1)

## 2023-07-28 LAB — VITAMIN B12: Vitamin B-12: 467 pg/mL (ref 180–914)

## 2023-07-28 LAB — IRON AND IRON BINDING CAPACITY (CC-WL,HP ONLY)
Iron: 56 ug/dL (ref 28–170)
Saturation Ratios: 15 % (ref 10.4–31.8)
TIBC: 371 ug/dL (ref 250–450)
UIBC: 315 ug/dL (ref 148–442)

## 2023-07-28 NOTE — Progress Notes (Signed)
Hematology and Oncology Follow Up Visit  Lisa Good [id number] 04-Sep-1951 72 y.o. 07/28/2023  Past Medical History:  Diagnosis Date   Allergy    seasonal   Arthritis    Cataract    Cerebral aneurysm    Chronic kidney disease    Chronic pain syndrome    Complication of anesthesia    trouble waking up after back surgery   DDD (degenerative disc disease), lumbar    Depression    DVT (deep venous thrombosis) (HCC) 2002   Fibromyalgia    GERD (gastroesophageal reflux disease)    Heart murmur    Heart dr said normal for me   History of kidney stones    Hyperlipidemia    Hypertension    Hypothyroidism    Incontinence in female    Iron deficiency anemia 11/01/2019   Irregular heart beat    Neuromuscular disorder (HCC)    peripheral neuropathy/feet   Obesity    OSA on CPAP    Pancolitis (HCC)    Pre-diabetes    RLS (restless legs syndrome)    UTI (urinary tract infection) 11/2022    Principle Diagnosis:  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 CKD  Current Therapy:   Venofer 300 mg - last infusion 06/17/2023     Interim History:  Lisa Good is back for follow-up for her IDA. She was first seen by our office on 05/13/2023. Labs from 03/29/2023 show an iron of 19, iron saturation of 4%, ferritin of 7 on 03/18/2023. Last CBC on 03/30/2023 showed a hemoglobin of 8.2, MCV of 80.6. She was started on Venofer 300 mg weekly x 3 weeks total. She is s/p Venofer on 05/20/2023, 05/27/2023, 06/17/2023. She is here today for repeat labs and consideration of additional Venofer. She has tolerated the Venofer well.   Today she reports that she is doing ok overall.   Her symptoms of SOB have resolved. Her fatigue is about the same but she attributes this to not sleeping well secondary to having her elderly mother living with her.   There has been no bleeding to her knowledge: denies epistaxis, gingivitis, hemoptysis, hematemesis, hematuria, melena, excessive bruising, blood donation.   She had an  endoscopy/colonoscopy on 06/13/2023. Two polyps were collected that were benign. No bleeding was found.      Wt Readings from Last 3 Encounters:  07/28/23 209 lb 0.6 oz (94.8 kg)  06/28/23 209 lb 1.9 oz (94.9 kg)  06/23/23 200 lb (90.7 kg)     Medications:   Current Outpatient Medications:    Alpha-Lipoic Acid 600 MG CAPS, Take 600 mg by mouth 2 (two) times daily., Disp: , Rfl:    ARIPiprazole (ABILIFY) 5 MG tablet, Take 1 tablet by mouth daily., Disp: , Rfl:    Buprenorphine HCl 300 MCG FILM, Place 1 Film inside cheek 2 (two) times daily., Disp: , Rfl:    docusate sodium (COLACE) 50 MG capsule, Take 50 mg by mouth daily as needed., Disp: , Rfl:    gabapentin (NEURONTIN) 800 MG tablet, Take 1 tablet by mouth 4 (four) times daily., Disp: , Rfl:    lisinopril (PRINIVIL,ZESTRIL) 40 MG tablet, Take 40 mg by mouth at bedtime., Disp: , Rfl:    methocarbamol (ROBAXIN) 500 MG tablet, Take 1 tablet (500 mg total) by mouth 4 (four) times daily., Disp: 120 tablet, Rfl: 0   nystatin-triamcinolone (MYCOLOG II) cream, Apply 1 Application topically as needed (under breast)., Disp: , Rfl:    pantoprazole (PROTONIX) 40 MG tablet, Take 40  mg by mouth every morning., Disp: , Rfl:    traZODone (DESYREL) 100 MG tablet, Take 100 mg by mouth at bedtime., Disp: , Rfl:    trimethoprim (TRIMPEX) 100 MG tablet, Take 1 tablet (100 mg total) by mouth daily., Disp: 90 tablet, Rfl: 3   venlafaxine XR (EFFEXOR-XR) 150 MG 24 hr capsule, Take 150 mg by mouth at bedtime. Take with 75 mg to equal 225 mg daily, Disp: , Rfl:    venlafaxine XR (EFFEXOR-XR) 75 MG 24 hr capsule, Take 75 mg by mouth at bedtime. Take with 150 mg to equal 225 mg daily, Disp: , Rfl:    XYLITOL MT, Use as directed 2 tablets in the mouth or throat daily as needed (dry mouth)., Disp: , Rfl:   Allergies:  Allergies  Allergen Reactions   Contrast Media [Iodinated Contrast Media] Hives   Duloxetine Swelling    Legs and feet   Pregabalin Swelling     Legs and feet   Iodine Hives   Clarithromycin Nausea Only   Spironolactone-Hctz Rash    Only with iv injection    Past Medical History, Surgical history, Social history, and Family History were reviewed and updated.  Review of Systems: As stated above in HPI   Physical Exam:  weight is 209 lb 0.6 oz (94.8 kg). Her oral temperature is 99.2 F (37.3 C). Her blood pressure is 160/84 (abnormal) and her pulse is 92. Her respiration is 18 and oxygen saturation is 98%.   Physical Exam General: NAD. Uses a cane to ambulate  Cardiovascular: regular rate and rhythm Pulmonary: clear ant fields Abdomen: soft, nontender, + bowel sounds GU: no suprapubic tenderness Extremities: no edema, no joint deformities Skin: no rashes Neurological: Weakness but otherwise nonfocal   Lab Results  Component Value Date   WBC 4.2 07/28/2023   HGB 11.2 (L) 07/28/2023   HCT 37.3 07/28/2023   MCV 88.0 07/28/2023   PLT 211 07/28/2023     Chemistry      Component Value Date/Time   NA 147 (H) 07/28/2023 1043   K 4.9 07/28/2023 1043   CL 106 07/28/2023 1043   CO2 34 (H) 07/28/2023 1043   BUN 19 07/28/2023 1043   CREATININE 1.32 (H) 07/28/2023 1043      Component Value Date/Time   CALCIUM 9.9 07/28/2023 1043   ALKPHOS 83 07/28/2023 1043   AST 14 (L) 07/28/2023 1043   ALT 7 07/28/2023 1043   BILITOT 0.3 07/28/2023 1043     Encounter Diagnoses  Name Primary?   Iron deficiency anemia due to chronic blood loss Yes   History of GI bleed    Assessment and Plan- Patient is a 72 y.o. 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 UTD with Colo/endo. Currently s/p Venofer 300 mg.   CBC and retic counts look good today.  CMP shows some worsening CKD. She will continue to follow up closely with her PCP.  Iron studied pending- Will supplement if needed.   Disposition: RTC 1 months me, labs (CBC w/, CMP, iron, ferritin, retic, erythrop) -[place]   Clent Jacks PA-C 10/23/202411:16 AM

## 2023-07-29 NOTE — Progress Notes (Signed)
PTNS  Session # 5  Health & Social Factors: No change Caffeine: 0 Alcohol: 0 Daytime voids #per day: 5 Night-time voids #per night: 2 Urgency: mild Incontinence Episodes #per day: 1 Ankle used: left  Treatment Setting: 7 Feeling/ Response: Sensory Comments: Patient tolerated the procedure   Performed By: Michiel Cowboy, PA-C   Follow Up: One week for #6 PTNS

## 2023-07-30 LAB — ERYTHROPOIETIN: Erythropoietin: 62.1 m[IU]/mL — ABNORMAL HIGH (ref 2.6–18.5)

## 2023-08-03 ENCOUNTER — Encounter: Payer: Self-pay | Admitting: Family Medicine

## 2023-08-03 ENCOUNTER — Ambulatory Visit (INDEPENDENT_AMBULATORY_CARE_PROVIDER_SITE_OTHER): Payer: Medicare Other | Admitting: Urology

## 2023-08-03 DIAGNOSIS — N3946 Mixed incontinence: Secondary | ICD-10-CM | POA: Diagnosis not present

## 2023-08-03 NOTE — Patient Instructions (Signed)

## 2023-08-05 NOTE — Patient Instructions (Addendum)
Please continue using your CPAP regularly. While your insurance requires that you use CPAP at least 4 hours each night on 70% of the nights, I recommend, that you not skip any nights and use it throughout the night if you can. Getting used to CPAP and staying with the treatment long term does take time and patience and discipline. Untreated obstructive sleep apnea when it is moderate to severe can have an adverse impact on cardiovascular health and raise her risk for heart disease, arrhythmias, hypertension, congestive heart failure, stroke and diabetes. Untreated obstructive sleep apnea causes sleep disruption, nonrestorative sleep, and sleep deprivation. This can have an impact on your day to day functioning and cause daytime sleepiness and impairment of cognitive function, memory loss, mood disturbance, and problems focussing. Using CPAP regularly can improve these symptoms.  We will update supply orders, today. Please reach out to DME regarding new mask and chin strap. Try to use CPAP nightly. I will recheck compliance in 6 weeks. We may adjust pressures pending your apneic events after using the machine more frequently. Keep an eye on BP at home.   Follow up in 6 months

## 2023-08-05 NOTE — Progress Notes (Signed)
PTNS   Session # 6   Health & Social Factors: No change Caffeine: 0 Alcohol: 0 Daytime voids #per day: 5 Night-time voids #per night: 2 Urgency: mild Incontinence Episodes #per day: 1 Ankle used: left  Treatment Setting: 7 Feeling/ Response: Sensory Comments: Patient tolerated the procedure    Performed By: Ples Specter CMA   Follow Up: One week for #7PTNS

## 2023-08-09 ENCOUNTER — Ambulatory Visit (INDEPENDENT_AMBULATORY_CARE_PROVIDER_SITE_OTHER): Payer: Medicare Other | Admitting: Family Medicine

## 2023-08-09 ENCOUNTER — Encounter: Payer: Self-pay | Admitting: Family Medicine

## 2023-08-09 VITALS — BP 163/93 | HR 96 | Ht 63.0 in | Wt 208.5 lb

## 2023-08-09 DIAGNOSIS — G4733 Obstructive sleep apnea (adult) (pediatric): Secondary | ICD-10-CM | POA: Diagnosis not present

## 2023-08-10 ENCOUNTER — Ambulatory Visit: Payer: Medicare Other | Admitting: Urology

## 2023-08-10 DIAGNOSIS — N3946 Mixed incontinence: Secondary | ICD-10-CM

## 2023-08-10 NOTE — Patient Instructions (Signed)

## 2023-08-12 NOTE — Progress Notes (Signed)
PTNS  Session # 7  Health & Social Factors: change Caffeine: 0 Alcohol: 0 Daytime voids #per day: 4-5 Night-time voids #per night: 1 Urgency: mild Incontinence Episodes #per day: 4 Ankle used: right Treatment Setting: 10 Feeling/ Response: Sensory  Comments: Patient tolerated.   Performed By: Cloretta Ned  Follow Up: 1 week follow up

## 2023-08-17 ENCOUNTER — Ambulatory Visit (INDEPENDENT_AMBULATORY_CARE_PROVIDER_SITE_OTHER): Payer: Medicare Other | Admitting: Urology

## 2023-08-17 DIAGNOSIS — N3946 Mixed incontinence: Secondary | ICD-10-CM

## 2023-08-18 ENCOUNTER — Other Ambulatory Visit: Payer: Self-pay | Admitting: Nurse Practitioner

## 2023-08-18 ENCOUNTER — Other Ambulatory Visit: Payer: Self-pay | Admitting: Neurosurgery

## 2023-08-18 DIAGNOSIS — G894 Chronic pain syndrome: Secondary | ICD-10-CM

## 2023-08-19 NOTE — Telephone Encounter (Signed)
She was last seen in May. Can given limited refill of robaxin and I will send to pharmacy.   Please let her know that she will need to get any further refills from her PCP or pain management provider.

## 2023-08-19 NOTE — Telephone Encounter (Signed)
Patient notified

## 2023-08-20 NOTE — Progress Notes (Unsigned)
PTNS  Session # 8  Health & Social Factors: No change Caffeine: 0 Alcohol: 0 Daytime voids #per day: 5 Night-time voids #per night: 2 Urgency: Mild Incontinence Episodes #per day: 3 Ankle used: Right  Treatment Setting: 15  Feeling/ Response: Sensory  Comments: Patient tolerated well  Performed By: Michiel Cowboy, PA-C   Follow Up: 1 week for #9 PTNS

## 2023-08-24 ENCOUNTER — Ambulatory Visit (INDEPENDENT_AMBULATORY_CARE_PROVIDER_SITE_OTHER): Payer: Medicare Other | Admitting: Urology

## 2023-08-24 DIAGNOSIS — N3946 Mixed incontinence: Secondary | ICD-10-CM

## 2023-08-24 NOTE — Patient Instructions (Signed)

## 2023-08-27 NOTE — Progress Notes (Unsigned)
PTNS  Session # 9  Health & Social Factors: No Caffeine: 0 Alcohol: 0 Daytime voids #per day: 5 Night-time voids #per night: 2 Urgency: Strong Incontinence Episodes #per day: 3 Ankle used: Right  Treatment Setting: 18 Feeling/ Response: Sensory  Comments: Patient tolerated the procedure   Performed By: Michiel Cowboy, PA-C   Follow Up: 1 week for #10 PTNS

## 2023-08-31 ENCOUNTER — Ambulatory Visit: Payer: Medicare Other | Admitting: Urology

## 2023-08-31 DIAGNOSIS — N3946 Mixed incontinence: Secondary | ICD-10-CM | POA: Diagnosis not present

## 2023-08-31 NOTE — Patient Instructions (Signed)

## 2023-09-01 ENCOUNTER — Inpatient Hospital Stay: Payer: Medicare Other | Admitting: Medical Oncology

## 2023-09-01 ENCOUNTER — Inpatient Hospital Stay: Payer: Medicare Other

## 2023-09-06 ENCOUNTER — Inpatient Hospital Stay (HOSPITAL_BASED_OUTPATIENT_CLINIC_OR_DEPARTMENT_OTHER): Payer: Medicare Other | Admitting: Medical Oncology

## 2023-09-06 ENCOUNTER — Encounter: Payer: Self-pay | Admitting: Medical Oncology

## 2023-09-06 ENCOUNTER — Inpatient Hospital Stay: Payer: Medicare Other | Attending: Medical Oncology

## 2023-09-06 VITALS — BP 174/67 | HR 84 | Temp 97.9°F | Resp 18 | Ht 63.0 in | Wt 213.1 lb

## 2023-09-06 DIAGNOSIS — K922 Gastrointestinal hemorrhage, unspecified: Secondary | ICD-10-CM | POA: Insufficient documentation

## 2023-09-06 DIAGNOSIS — E538 Deficiency of other specified B group vitamins: Secondary | ICD-10-CM

## 2023-09-06 DIAGNOSIS — D500 Iron deficiency anemia secondary to blood loss (chronic): Secondary | ICD-10-CM | POA: Diagnosis present

## 2023-09-06 DIAGNOSIS — N189 Chronic kidney disease, unspecified: Secondary | ICD-10-CM | POA: Insufficient documentation

## 2023-09-06 DIAGNOSIS — Z8719 Personal history of other diseases of the digestive system: Secondary | ICD-10-CM

## 2023-09-06 DIAGNOSIS — Z79899 Other long term (current) drug therapy: Secondary | ICD-10-CM | POA: Insufficient documentation

## 2023-09-06 DIAGNOSIS — R7989 Other specified abnormal findings of blood chemistry: Secondary | ICD-10-CM

## 2023-09-06 LAB — CBC WITH DIFFERENTIAL (CANCER CENTER ONLY)
Abs Immature Granulocytes: 0.05 10*3/uL (ref 0.00–0.07)
Basophils Absolute: 0 10*3/uL (ref 0.0–0.1)
Basophils Relative: 1 %
Eosinophils Absolute: 0.2 10*3/uL (ref 0.0–0.5)
Eosinophils Relative: 4 %
HCT: 35.7 % — ABNORMAL LOW (ref 36.0–46.0)
Hemoglobin: 11 g/dL — ABNORMAL LOW (ref 12.0–15.0)
Immature Granulocytes: 1 %
Lymphocytes Relative: 18 %
Lymphs Abs: 1 10*3/uL (ref 0.7–4.0)
MCH: 27.4 pg (ref 26.0–34.0)
MCHC: 30.8 g/dL (ref 30.0–36.0)
MCV: 88.8 fL (ref 80.0–100.0)
Monocytes Absolute: 0.3 10*3/uL (ref 0.1–1.0)
Monocytes Relative: 6 %
Neutro Abs: 3.7 10*3/uL (ref 1.7–7.7)
Neutrophils Relative %: 70 %
Platelet Count: 202 10*3/uL (ref 150–400)
RBC: 4.02 MIL/uL (ref 3.87–5.11)
RDW: 15.8 % — ABNORMAL HIGH (ref 11.5–15.5)
WBC Count: 5.2 10*3/uL (ref 4.0–10.5)
nRBC: 0 % (ref 0.0–0.2)

## 2023-09-06 LAB — CMP (CANCER CENTER ONLY)
ALT: 7 U/L (ref 0–44)
AST: 14 U/L — ABNORMAL LOW (ref 15–41)
Albumin: 4 g/dL (ref 3.5–5.0)
Alkaline Phosphatase: 91 U/L (ref 38–126)
Anion gap: 8 (ref 5–15)
BUN: 16 mg/dL (ref 8–23)
CO2: 33 mmol/L — ABNORMAL HIGH (ref 22–32)
Calcium: 9.8 mg/dL (ref 8.9–10.3)
Chloride: 105 mmol/L (ref 98–111)
Creatinine: 1.18 mg/dL — ABNORMAL HIGH (ref 0.44–1.00)
GFR, Estimated: 49 mL/min — ABNORMAL LOW (ref >60)
Glucose, Bld: 109 mg/dL — ABNORMAL HIGH (ref 70–99)
Potassium: 4.5 mmol/L (ref 3.5–5.1)
Sodium: 146 mmol/L — ABNORMAL HIGH (ref 135–145)
Total Bilirubin: 0.4 mg/dL (ref <1.2)
Total Protein: 6.8 g/dL (ref 6.5–8.1)

## 2023-09-06 LAB — RETICULOCYTES
Immature Retic Fract: 11 % (ref 2.3–15.9)
RBC.: 4.03 MIL/uL (ref 3.87–5.11)
Retic Count, Absolute: 54 10*3/uL (ref 19.0–186.0)
Retic Ct Pct: 1.3 % (ref 0.4–3.1)

## 2023-09-06 LAB — VITAMIN B12: Vitamin B-12: 379 pg/mL (ref 180–914)

## 2023-09-06 LAB — FERRITIN: Ferritin: 16 ng/mL (ref 11–307)

## 2023-09-06 NOTE — Progress Notes (Signed)
Hematology and Oncology Follow Up Visit  Lisa Good [id number] Oct 11, 1950 72 y.o. 09/06/2023  Past Medical History:  Diagnosis Date   Allergy    seasonal   Arthritis    Cataract    Cerebral aneurysm    Chronic kidney disease    Chronic pain syndrome    Complication of anesthesia    trouble waking up after back surgery   DDD (degenerative disc disease), lumbar    Depression    DVT (deep venous thrombosis) (HCC) 2002   Fibromyalgia    GERD (gastroesophageal reflux disease)    Heart murmur    Heart dr said normal for me   History of kidney stones    Hyperlipidemia    Hypertension    Hypothyroidism    Incontinence in female    Iron deficiency anemia 11/01/2019   Irregular heart beat    Neuromuscular disorder (HCC)    peripheral neuropathy/feet   Obesity    OSA on CPAP    Pancolitis (HCC)    Pre-diabetes    RLS (restless legs syndrome)    UTI (urinary tract infection) 11/2022    Principle Diagnosis:  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 CKD  Current Therapy:   Venofer 300 mg - last infusion 06/17/2023  Prior Work Up:  She had an endoscopy/colonoscopy on 06/13/2023. Two polyps were collected that were benign. No bleeding was found.     Interim History:  Lisa Good is back for follow-up for her IDA. She was first seen by our office on 05/13/2023. Labs from 03/29/2023 show an iron of 19, iron saturation of 4%, ferritin of 7 on 03/18/2023. Last CBC on 03/30/2023 showed a hemoglobin of 8.2, MCV of 80.6. She was started on Venofer 300 mg weekly x 3 weeks total. She is s/p Venofer on 05/20/2023, 05/27/2023, 06/17/2023. She is here today for repeat labs and consideration of additional Venofer. She has tolerated the Venofer well.   She is working with her PCP regarding her tension headaches. She is considering getting trigger point injections.   Today she states that her symptoms of SOB and fatigue have improved.   There has been no bleeding to her knowledge: denies epistaxis,  gingivitis, hemoptysis, hematemesis, hematuria, melena, excessive bruising, blood donation.      Wt Readings from Last 3 Encounters:  09/06/23 213 lb 1.9 oz (96.7 kg)  08/09/23 208 lb 8 oz (94.6 kg)  07/28/23 209 lb 0.6 oz (94.8 kg)     Medications:   Current Outpatient Medications:    Alpha-Lipoic Acid 600 MG CAPS, Take 600 mg by mouth 2 (two) times daily., Disp: , Rfl:    ARIPiprazole (ABILIFY) 5 MG tablet, Take 1 tablet by mouth daily., Disp: , Rfl:    Buprenorphine HCl 300 MCG FILM, Place 1 Film inside cheek 2 (two) times daily., Disp: , Rfl:    docusate sodium (COLACE) 50 MG capsule, Take 50 mg by mouth daily as needed., Disp: , Rfl:    gabapentin (NEURONTIN) 800 MG tablet, Take 1 tablet by mouth 4 (four) times daily., Disp: , Rfl:    lisinopril (PRINIVIL,ZESTRIL) 40 MG tablet, Take 40 mg by mouth at bedtime., Disp: , Rfl:    meloxicam (MOBIC) 7.5 MG tablet, Take 7.5 mg by mouth daily., Disp: , Rfl:    methocarbamol (ROBAXIN) 500 MG tablet, Take 1 tablet (500 mg total) by mouth every 6 (six) hours as needed for muscle spasms., Disp: 40 tablet, Rfl: 0   nystatin-triamcinolone (MYCOLOG II) cream, Apply 1 Application  topically as needed (under breast)., Disp: , Rfl:    pantoprazole (PROTONIX) 40 MG tablet, Take 40 mg by mouth every morning., Disp: , Rfl:    traZODone (DESYREL) 100 MG tablet, Take 100 mg by mouth at bedtime., Disp: , Rfl:    trimethoprim (TRIMPEX) 100 MG tablet, Take 1 tablet (100 mg total) by mouth daily., Disp: 90 tablet, Rfl: 3   venlafaxine XR (EFFEXOR-XR) 150 MG 24 hr capsule, Take 150 mg by mouth at bedtime. Take with 75 mg to equal 225 mg daily, Disp: , Rfl:    venlafaxine XR (EFFEXOR-XR) 75 MG 24 hr capsule, Take 75 mg by mouth at bedtime. Take with 150 mg to equal 225 mg daily, Disp: , Rfl:    XYLITOL MT, Use as directed 2 tablets in the mouth or throat daily as needed (dry mouth)., Disp: , Rfl:   Allergies:  Allergies  Allergen Reactions   Contrast Media  [Iodinated Contrast Media] Hives   Duloxetine Swelling    Legs and feet   Pregabalin Swelling    Legs and feet   Iodine Hives   Clarithromycin Nausea Only   Spironolactone-Hctz Rash    Only with iv injection    Past Medical History, Surgical history, Social history, and Family History were reviewed and updated.  Review of Systems: As stated above in HPI   Physical Exam:  height is 5\' 3"  (1.6 m) and weight is 213 lb 1.9 oz (96.7 kg). Her oral temperature is 97.9 F (36.6 C). Her blood pressure is 174/67 (abnormal) and her pulse is 84. Her respiration is 18 and oxygen saturation is 100%.   Physical Exam General: NAD. Uses a cane to ambulate  Cardiovascular: regular rate and rhythm Pulmonary: clear ant fields Abdomen: soft, nontender, + bowel sounds GU: no suprapubic tenderness Extremities: no edema, no joint deformities Skin: no rashes Neurological: Weakness but otherwise nonfocal   Lab Results  Component Value Date   WBC 5.2 09/06/2023   HGB 11.0 (L) 09/06/2023   HCT 35.7 (L) 09/06/2023   MCV 88.8 09/06/2023   PLT 202 09/06/2023     Chemistry      Component Value Date/Time   NA 146 (H) 09/06/2023 1423   K 4.5 09/06/2023 1423   CL 105 09/06/2023 1423   CO2 33 (H) 09/06/2023 1423   BUN 16 09/06/2023 1423   CREATININE 1.18 (H) 09/06/2023 1423      Component Value Date/Time   CALCIUM 9.8 09/06/2023 1423   ALKPHOS 91 09/06/2023 1423   AST 14 (L) 09/06/2023 1423   ALT 7 09/06/2023 1423   BILITOT 0.4 09/06/2023 1423     Encounter Diagnosis  Name Primary?   Iron deficiency anemia due to chronic blood loss Yes   Assessment and Plan- Patient is a 72 y.o. 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 UTD with Colo/endo. Currently s/p Venofer 300 mg.   Hgb essentially stable at 11. Creatinine is a bit improved  Iron studied pending- Will supplement if needed.   Disposition: RTC 1 months me, labs (CBC w/, CMP, iron, ferritin, retic, erythrop) -[place]   Clent Jacks PA-C 12/2/20243:23 PM

## 2023-09-06 NOTE — Progress Notes (Unsigned)
PTNS  Session # 10  Health & Social Factors: *** Caffeine: *** Alcohol: *** Daytime voids #per day: *** Night-time voids #per night: *** Urgency: *** Incontinence Episodes #per day: *** Ankle used: *** Treatment Setting: *** Feeling/ Response: *** Comments: ***  Performed By: ***  Follow Up: 1 week for number 11 out of 12 PTNS

## 2023-09-07 ENCOUNTER — Ambulatory Visit (INDEPENDENT_AMBULATORY_CARE_PROVIDER_SITE_OTHER): Payer: Medicare Other | Admitting: Urology

## 2023-09-07 ENCOUNTER — Other Ambulatory Visit: Payer: Self-pay | Admitting: Medical Oncology

## 2023-09-07 DIAGNOSIS — N3946 Mixed incontinence: Secondary | ICD-10-CM | POA: Diagnosis not present

## 2023-09-07 LAB — IRON AND IRON BINDING CAPACITY (CC-WL,HP ONLY)
Iron: 44 ug/dL (ref 28–170)
Saturation Ratios: 11 % (ref 10.4–31.8)
TIBC: 388 ug/dL (ref 250–450)
UIBC: 344 ug/dL (ref 148–442)

## 2023-09-07 LAB — ERYTHROPOIETIN: Erythropoietin: 90.2 m[IU]/mL — ABNORMAL HIGH (ref 2.6–18.5)

## 2023-09-07 NOTE — Patient Instructions (Signed)

## 2023-09-08 ENCOUNTER — Encounter: Payer: Self-pay | Admitting: Hematology & Oncology

## 2023-09-10 NOTE — Progress Notes (Unsigned)
PTNS  Session # 11  Health & Social Factors: No change  Caffeine: 0 Alcohol: 1 glass  Daytime voids #per day: 5 Night-time voids #per night: 1-2 Urgency: mild Incontinence Episodes #per day: 1 Ankle used: right Treatment Setting: 18 Feeling/ Response: Sensory  Comments: Patient tolerated procedure well.  Performed By:   Follow Up: 1 week for #12 PTNS

## 2023-09-14 ENCOUNTER — Encounter: Payer: Self-pay | Admitting: Urology

## 2023-09-14 ENCOUNTER — Ambulatory Visit: Payer: Medicare Other | Admitting: Urology

## 2023-09-14 VITALS — BP 130/71 | HR 111 | Ht 63.0 in | Wt 213.0 lb

## 2023-09-14 DIAGNOSIS — N3946 Mixed incontinence: Secondary | ICD-10-CM | POA: Diagnosis not present

## 2023-09-14 NOTE — Patient Instructions (Signed)

## 2023-09-15 ENCOUNTER — Inpatient Hospital Stay: Payer: Medicare Other

## 2023-09-15 VITALS — BP 128/56 | HR 76 | Temp 98.1°F

## 2023-09-15 DIAGNOSIS — D500 Iron deficiency anemia secondary to blood loss (chronic): Secondary | ICD-10-CM

## 2023-09-15 MED ORDER — DIPHENHYDRAMINE HCL 50 MG/ML IJ SOLN: 50.0000 mg | Freq: Once | INTRAMUSCULAR | Status: DC | PRN

## 2023-09-15 MED ORDER — SODIUM CHLORIDE 0.9 % IV SOLN
300.0000 mg | Freq: Once | INTRAVENOUS | Status: AC
  Administered 2023-09-15: 300 mg via INTRAVENOUS
  Filled 2023-09-15: qty 300

## 2023-09-15 MED ORDER — METHYLPREDNISOLONE SODIUM SUCC 125 MG IJ SOLR
125.0000 mg | Freq: Once | INTRAMUSCULAR | Status: DC | PRN
Start: 2023-09-15 — End: 2023-09-15

## 2023-09-15 MED ORDER — ALBUTEROL SULFATE HFA 108 (90 BASE) MCG/ACT IN AERS
2.0000 | INHALATION_SPRAY | Freq: Once | RESPIRATORY_TRACT | Status: DC | PRN
Start: 2023-09-15 — End: 2023-09-15

## 2023-09-15 MED ORDER — FAMOTIDINE IN NACL 20-0.9 MG/50ML-% IV SOLN: 20.0000 mg | Freq: Once | INTRAVENOUS | Status: DC | PRN

## 2023-09-15 MED ORDER — SODIUM CHLORIDE 0.9 % IV SOLN
Freq: Once | INTRAVENOUS | Status: AC
Start: 2023-09-15 — End: 2023-09-15

## 2023-09-15 MED ORDER — EPINEPHRINE 0.3 MG/0.3ML IJ SOAJ: 0.3000 mg | Freq: Once | INTRAMUSCULAR | Status: DC | PRN

## 2023-09-15 MED ORDER — SODIUM CHLORIDE 0.9 % IV SOLN: Freq: Once | INTRAVENOUS | Status: DC | PRN

## 2023-09-15 NOTE — Patient Instructions (Signed)
Iron Sucrose Injection What is this medication? IRON SUCROSE (EYE ern SOO krose) treats low levels of iron (iron deficiency anemia) in people with kidney disease. Iron is a mineral that plays an important role in making red blood cells, which carry oxygen from your lungs to the rest of your body. This medicine may be used for other purposes; ask your health care provider or pharmacist if you have questions. COMMON BRAND NAME(S): Venofer What should I tell my care team before I take this medication? They need to know if you have any of these conditions: Anemia not caused by low iron levels Heart disease High levels of iron in the blood Kidney disease Liver disease An unusual or allergic reaction to iron, other medications, foods, dyes, or preservatives Pregnant or trying to get pregnant Breastfeeding How should I use this medication? This medication is for infusion into a vein. It is given in a hospital or clinic setting. Talk to your care team about the use of this medication in children. While this medication may be prescribed for children as young as 2 years for selected conditions, precautions do apply. Overdosage: If you think you have taken too much of this medicine contact a poison control center or emergency room at once. NOTE: This medicine is only for you. Do not share this medicine with others. What if I miss a dose? Keep appointments for follow-up doses. It is important not to miss your dose. Call your care team if you are unable to keep an appointment. What may interact with this medication? Do not take this medication with any of the following: Deferoxamine Dimercaprol Other iron products This medication may also interact with the following: Chloramphenicol Deferasirox This list may not describe all possible interactions. Give your health care provider a list of all the medicines, herbs, non-prescription drugs, or dietary supplements you use. Also tell them if you smoke,  drink alcohol, or use illegal drugs. Some items may interact with your medicine. What should I watch for while using this medication? Visit your care team regularly. Tell your care team if your symptoms do not start to get better or if they get worse. You may need blood work done while you are taking this medication. You may need to follow a special diet. Talk to your care team. Foods that contain iron include: whole grains/cereals, dried fruits, beans, or peas, leafy green vegetables, and organ meats (liver, kidney). What side effects may I notice from receiving this medication? Side effects that you should report to your care team as soon as possible: Allergic reactions--skin rash, itching, hives, swelling of the face, lips, tongue, or throat Low blood pressure--dizziness, feeling faint or lightheaded, blurry vision Shortness of breath Side effects that usually do not require medical attention (report to your care team if they continue or are bothersome): Flushing Headache Joint pain Muscle pain Nausea Pain, redness, or irritation at injection site This list may not describe all possible side effects. Call your doctor for medical advice about side effects. You may report side effects to FDA at 1-800-FDA-1088. Where should I keep my medication? This medication is given in a hospital or clinic. It will not be stored at home. NOTE: This sheet is a summary. It may not cover all possible information. If you have questions about this medicine, talk to your doctor, pharmacist, or health care provider.  2024 Elsevier/Gold Standard (2023-02-26 00:00:00)

## 2023-09-19 NOTE — Progress Notes (Unsigned)
PTNS  Session # 12  Health & Social Factors: *** Caffeine: *** Alcohol: *** Daytime voids #per day: *** Night-time voids #per night: *** Urgency: *** Incontinence Episodes #per day: *** Ankle used: *** Treatment Setting: *** Feeling/ Response: *** Comments: ***  Performed By: ***  Follow Up: One month for maintenance PTNS

## 2023-09-21 ENCOUNTER — Telehealth: Payer: Self-pay | Admitting: Family Medicine

## 2023-09-21 ENCOUNTER — Ambulatory Visit (INDEPENDENT_AMBULATORY_CARE_PROVIDER_SITE_OTHER): Payer: Medicare Other | Admitting: Urology

## 2023-09-21 DIAGNOSIS — N3946 Mixed incontinence: Secondary | ICD-10-CM

## 2023-09-21 NOTE — Telephone Encounter (Addendum)
Left message for patient to call. Order was placed at [facility] on  for new mask,.

## 2023-09-21 NOTE — Telephone Encounter (Signed)
[facility] @ [facility]  has called to f/u on where things stand re: the issue pt reported with CPAP face mask not fitting, Marcelino Duster is asking to be called at [phone]

## 2023-09-22 ENCOUNTER — Inpatient Hospital Stay: Payer: Medicare Other

## 2023-09-22 VITALS — BP 152/69 | HR 74 | Temp 97.7°F | Resp 18

## 2023-09-22 DIAGNOSIS — D500 Iron deficiency anemia secondary to blood loss (chronic): Secondary | ICD-10-CM

## 2023-09-22 MED ORDER — SODIUM CHLORIDE 0.9 % IV SOLN: INTRAVENOUS | Status: DC

## 2023-09-22 MED ORDER — SODIUM CHLORIDE 0.9 % IV SOLN
300.0000 mg | Freq: Once | INTRAVENOUS | Status: AC
  Administered 2023-09-22: 300.0065 mg via INTRAVENOUS
  Filled 2023-09-22: qty 300

## 2023-09-22 NOTE — Progress Notes (Signed)
Pt refused to stay for 30 minutes post iron observation. Pt without complaints at time of discharge.

## 2023-09-30 ENCOUNTER — Inpatient Hospital Stay: Payer: Medicare Other

## 2023-10-02 ENCOUNTER — Other Ambulatory Visit: Payer: Self-pay

## 2023-10-02 ENCOUNTER — Encounter (HOSPITAL_BASED_OUTPATIENT_CLINIC_OR_DEPARTMENT_OTHER): Payer: Self-pay

## 2023-10-02 ENCOUNTER — Emergency Department (HOSPITAL_BASED_OUTPATIENT_CLINIC_OR_DEPARTMENT_OTHER): Payer: Medicare Other

## 2023-10-02 ENCOUNTER — Emergency Department (HOSPITAL_BASED_OUTPATIENT_CLINIC_OR_DEPARTMENT_OTHER)
Admission: EM | Admit: 2023-10-02 | Discharge: 2023-10-02 | Disposition: A | Discharge status: Home / Self Care | Payer: Medicare Other | Attending: Emergency Medicine | Admitting: Emergency Medicine

## 2023-10-02 DIAGNOSIS — J189 Pneumonia, unspecified organism: Secondary | ICD-10-CM | POA: Diagnosis not present

## 2023-10-02 DIAGNOSIS — R051 Acute cough: Secondary | ICD-10-CM

## 2023-10-02 DIAGNOSIS — E039 Hypothyroidism, unspecified: Secondary | ICD-10-CM | POA: Diagnosis not present

## 2023-10-02 DIAGNOSIS — I10 Essential (primary) hypertension: Secondary | ICD-10-CM | POA: Insufficient documentation

## 2023-10-02 DIAGNOSIS — R0602 Shortness of breath: Secondary | ICD-10-CM

## 2023-10-02 DIAGNOSIS — Z20822 Contact with and (suspected) exposure to covid-19: Secondary | ICD-10-CM | POA: Diagnosis not present

## 2023-10-02 DIAGNOSIS — R059 Cough, unspecified: Secondary | ICD-10-CM | POA: Diagnosis present

## 2023-10-02 LAB — URINALYSIS, W/ REFLEX TO CULTURE (INFECTION SUSPECTED)
Bilirubin Urine: NEGATIVE
Glucose, UA: NEGATIVE mg/dL
Hgb urine dipstick: NEGATIVE
Ketones, ur: NEGATIVE mg/dL
Leukocytes,Ua: NEGATIVE
Nitrite: NEGATIVE
Protein, ur: NEGATIVE mg/dL
RBC / HPF: NONE SEEN RBC/hpf (ref 0–5)
Specific Gravity, Urine: 1.01 (ref 1.005–1.030)
pH: 5.5 (ref 5.0–8.0)

## 2023-10-02 LAB — CBC WITH DIFFERENTIAL/PLATELET
Abs Immature Granulocytes: 0.01 10*3/uL (ref 0.00–0.07)
Basophils Absolute: 0 10*3/uL (ref 0.0–0.1)
Basophils Relative: 1 %
Eosinophils Absolute: 0.2 10*3/uL (ref 0.0–0.5)
Eosinophils Relative: 5 %
HCT: 33.5 % — ABNORMAL LOW (ref 36.0–46.0)
Hemoglobin: 10.5 g/dL — ABNORMAL LOW (ref 12.0–15.0)
Immature Granulocytes: 0 %
Lymphocytes Relative: 17 %
Lymphs Abs: 0.7 10*3/uL (ref 0.7–4.0)
MCH: 28.4 pg (ref 26.0–34.0)
MCHC: 31.3 g/dL (ref 30.0–36.0)
MCV: 90.5 fL (ref 80.0–100.0)
Monocytes Absolute: 0.3 10*3/uL (ref 0.1–1.0)
Monocytes Relative: 8 %
Neutro Abs: 3 10*3/uL (ref 1.7–7.7)
Neutrophils Relative %: 69 %
Platelets: 143 10*3/uL — ABNORMAL LOW (ref 150–400)
RBC: 3.7 MIL/uL — ABNORMAL LOW (ref 3.87–5.11)
RDW: 16.4 % — ABNORMAL HIGH (ref 11.5–15.5)
WBC: 4.4 10*3/uL (ref 4.0–10.5)
nRBC: 0 % (ref 0.0–0.2)

## 2023-10-02 LAB — RESP PANEL BY RT-PCR (RSV, FLU A&B, COVID)  RVPGX2
Influenza A by PCR: NEGATIVE
Influenza B by PCR: NEGATIVE
Resp Syncytial Virus by PCR: NEGATIVE
SARS Coronavirus 2 by RT PCR: NEGATIVE

## 2023-10-02 LAB — COMPREHENSIVE METABOLIC PANEL
ALT: 11 U/L (ref 0–44)
AST: 18 U/L (ref 15–41)
Albumin: 3.4 g/dL — ABNORMAL LOW (ref 3.5–5.0)
Alkaline Phosphatase: 94 U/L (ref 38–126)
Anion gap: 4 — ABNORMAL LOW (ref 5–15)
BUN: 13 mg/dL (ref 8–23)
CO2: 28 mmol/L (ref 22–32)
Calcium: 8.9 mg/dL (ref 8.9–10.3)
Chloride: 109 mmol/L (ref 98–111)
Creatinine, Ser: 0.89 mg/dL (ref 0.44–1.00)
GFR, Estimated: >60 mL/min (ref >60)
Glucose, Bld: 97 mg/dL (ref 70–99)
Potassium: 4.5 mmol/L (ref 3.5–5.1)
Sodium: 141 mmol/L (ref 135–145)
Total Bilirubin: 0.3 mg/dL (ref <1.2)
Total Protein: 5.9 g/dL — ABNORMAL LOW (ref 6.5–8.1)

## 2023-10-02 LAB — LACTIC ACID, PLASMA: Lactic Acid, Venous: 1.1 mmol/L (ref 0.5–1.9)

## 2023-10-02 LAB — TROPONIN I (HIGH SENSITIVITY): Troponin I (High Sensitivity): 4 ng/L (ref <18)

## 2023-10-02 MED ORDER — DOXYCYCLINE HYCLATE 100 MG PO CAPS: 100.0000 mg | ORAL_CAPSULE | Freq: Two times a day (BID) | ORAL | 0 refills | Status: AC

## 2023-10-02 NOTE — ED Notes (Signed)
Fall risk armband Fall risk sign on door Patient wearing shoes

## 2023-10-02 NOTE — ED Notes (Signed)
Pt alert and oriented X 4 at the time of discharge. RR even and unlabored. No acute distress noted. Pt verbalized understanding of discharge instructions as discussed. Pt ambulatory to lobby at time of discharge.

## 2023-10-02 NOTE — ED Provider Notes (Signed)
[facility] Provider Note   CSN: [id number] Arrival date & time: 10/02/23  4098     History  Chief Complaint  Patient presents with   Cough   URI    Lisa Good is a 72 y.o. female.  The history is provided by the patient, the spouse and medical records. No language interpreter was used.  Cough Cough characteristics:  Productive Sputum characteristics:  Nondescript Severity:  Moderate Onset quality:  Gradual Duration:  1 week Timing:  Constant Progression:  Worsening Chronicity:  Recurrent Context: not sick contacts   Relieved by:  Nothing Worsened by:  Nothing Ineffective treatments:  None tried Associated symptoms: shortness of breath and sinus congestion   Associated symptoms: no chest pain, no chills, no fever, no headaches, no rash and no wheezing   URI Presenting symptoms: congestion, cough and fatigue   Presenting symptoms: no fever   Severity:  Moderate Onset quality:  Gradual Relieved by:  Nothing Worsened by:  Nothing Ineffective treatments:  None tried Associated symptoms: no headaches and no wheezing   Risk factors: no recent travel and no sick contacts        Home Medications Prior to Admission medications   Medication Sig Start Date End Date Taking? Authorizing Provider  Alpha-Lipoic Acid 600 MG CAPS Take 600 mg by mouth 2 (two) times daily.    [provider]  ARIPiprazole (ABILIFY) 5 MG tablet Take 1 tablet by mouth daily. 07/22/23   [provider]  Buprenorphine HCl 300 MCG FILM Place 1 Film inside cheek 2 (two) times daily. 07/23/23   [provider]  docusate sodium (COLACE) 50 MG capsule Take 50 mg by mouth daily as needed.    [provider]  gabapentin (NEURONTIN) 800 MG tablet Take 1 tablet by mouth 4 (four) times daily. 09/18/16   [provider]  lisinopril (PRINIVIL,ZESTRIL) 40 MG tablet Take 40 mg by mouth at bedtime.    [provider]  meloxicam (MOBIC) 7.5 MG tablet Take 7.5 mg by mouth daily. 08/24/23 08/23/24  [provider]  methocarbamol (ROBAXIN) 500 MG tablet Take 1 tablet (500 mg total) by mouth every 6 (six) hours as needed for muscle spasms. 08/19/23   Drake Leach, PA-C  nystatin-triamcinolone (MYCOLOG II) cream Apply 1 Application topically as needed (under breast). 03/18/23 03/17/24  [provider]  pantoprazole (PROTONIX) 40 MG tablet Take 40 mg by mouth every morning. 11/17/21   [provider]  traZODone (DESYREL) 100 MG tablet Take 100 mg by mouth at bedtime.    [provider]  trimethoprim (TRIMPEX) 100 MG tablet Take 1 tablet (100 mg total) by mouth daily. 06/14/23   Alfredo Martinez, MD  venlafaxine XR (EFFEXOR-XR) 150 MG 24 hr capsule Take 150 mg by mouth at bedtime. Take with 75 mg to equal 225 mg daily 10/26/19   [provider]  venlafaxine XR (EFFEXOR-XR) 75 MG 24 hr capsule Take 75 mg by mouth at bedtime. Take with 150 mg to equal 225 mg daily 10/16/21   [provider]  XYLITOL MT Use as directed 2 tablets in the mouth or throat daily as needed (dry mouth).    [provider]      Allergies    Contrast media [iodinated contrast media], Duloxetine, Pregabalin, Iodine, Clarithromycin, and Spironolactone-hctz    Review of Systems   Review of Systems  Constitutional:  Positive for fatigue. Negative for chills and fever.  HENT:  Positive  for congestion.   Respiratory:  Positive for cough, chest tightness and shortness of breath. Negative for wheezing.   Cardiovascular:  Negative for chest pain, palpitations and leg swelling.  Gastrointestinal:  Negative for abdominal pain, constipation, diarrhea, nausea and vomiting.  Genitourinary:  Positive for frequency. Negative for dysuria.  Musculoskeletal:  Negative for back pain.  Skin:  Negative for rash.  Neurological:  Negative for headaches.  Psychiatric/Behavioral:  Negative for  agitation.   All other systems reviewed and are negative.   Physical Exam Updated Vital Signs BP (!) 172/85 (BP Location: Left Arm)   Pulse 99   Temp 98.6 F (37 C) (Oral)   Resp 18   Ht 5\' 3"  (1.6 m)   Wt 96.6 kg   SpO2 94%   BMI 37.72 kg/m  Physical Exam Vitals and nursing note reviewed.  Constitutional:      General: She is not in acute distress.    Appearance: She is well-developed. She is not ill-appearing, toxic-appearing or diaphoretic.  HENT:     Head: Normocephalic and atraumatic.     Nose: No congestion or rhinorrhea.     Mouth/Throat:     Mouth: Mucous membranes are moist.     Pharynx: No oropharyngeal exudate or posterior oropharyngeal erythema.  Eyes:     Extraocular Movements: Extraocular movements intact.     Conjunctiva/sclera: Conjunctivae normal.     Pupils: Pupils are equal, round, and reactive to light.  Cardiovascular:     Rate and Rhythm: Normal rate and regular rhythm.     Heart sounds: No murmur heard. Pulmonary:     Effort: Pulmonary effort is normal. No respiratory distress.     Breath sounds: Rhonchi present. No wheezing or rales.  Chest:     Chest wall: No tenderness.  Abdominal:     General: Abdomen is flat.     Palpations: Abdomen is soft.     Tenderness: There is no abdominal tenderness. There is no right CVA tenderness, left CVA tenderness, guarding or rebound.  Musculoskeletal:        General: No swelling or tenderness.     Cervical back: Neck supple. No tenderness.     Right lower leg: No edema.     Left lower leg: No edema.  Skin:    General: Skin is warm and dry.     Capillary Refill: Capillary refill takes less than 2 seconds.     Findings: No erythema or rash.  Neurological:     General: No focal deficit present.     Mental Status: She is alert.     Sensory: No sensory deficit.     Motor: No weakness.  Psychiatric:        Mood and Affect: Mood normal.     ED Results / Procedures / Treatments   Labs (all labs ordered  are listed, but only abnormal results are displayed) Labs Reviewed  CBC WITH DIFFERENTIAL/PLATELET - Abnormal; Notable for the following components:      Result Value   RBC 3.70 (*)    Hemoglobin 10.5 (*)    HCT 33.5 (*)    RDW 16.4 (*)    Platelets 143 (*)    All other components within normal limits  COMPREHENSIVE METABOLIC PANEL - Abnormal; Notable for the following components:   Total Protein 5.9 (*)    Albumin 3.4 (*)    Anion gap 4 (*)    All other components within normal limits  URINALYSIS, W/ REFLEX TO CULTURE (INFECTION  SUSPECTED) - Abnormal; Notable for the following components:   Bacteria, UA RARE (*)    All other components within normal limits  RESP PANEL BY RT-PCR (RSV, FLU A&B, COVID)  [id number]  LACTIC ACID, PLASMA  LACTIC ACID, PLASMA  TROPONIN I (HIGH SENSITIVITY)    EKG None  Radiology DG Chest Portable 1 View Result Date: 10/02/2023 CLINICAL DATA:  72 year old female with history of productive cough and shortness of breath. EXAM: PORTABLE CHEST 1 VIEW COMPARISON:  Chest x-ray 04/27/2023. FINDINGS: Linear scarring in the left mid lung. No consolidative airspace disease. No pleural effusions. No pneumothorax. No evidence of pulmonary edema. Heart size is mildly enlarged. Upper mediastinal contours are within normal limits. Atherosclerotic calcifications in the thoracic aorta. Extensive orthopedic fixation hardware in the cervical spine, upper thoracic spine, lower thoracic spine and lumbar spine, partially imaged. Spinal cord stimulator projecting over the lower thoracic region. Status post right shoulder arthroplasty. IMPRESSION: 1. No definite radiographic evidence of acute cardiopulmonary disease. 2. Mild chronic scarring in the left mid lung, unchanged. 3. Mild cardiomegaly. 4. Aortic atherosclerosis. Electronically Signed   By: Trudie Reed M.D.   On: 10/02/2023 08:41    Procedures Procedures    Medications Ordered in ED Medications - No data to  display  ED Course/ Medical Decision Making/ A&P                                 Medical Decision Making Amount and/or Complexity of Data Reviewed Labs: ordered. Radiology: ordered.    Lisa Good is a 72 y.o. female with a past medical history significant for GERD, hypertension, hyperlipidemia, restless legs, fibromyalgia, hypothyroidism, remote history of DVT, previous urinary tract infections, and previous kidney stones who presents with worsening cough and shortness of breath for the last week and some urinary frequency.  According to patient, he has had some cold symptoms about a week that has gradually worsened to shortness of breath developing over the last few days.  She denies any chest pain or palpitations but is very tired and winded.  She is denying any chest pressure but denies any sick contacts.  She reports she has had a productive cough with phlegm.  Denies nausea, vomiting, constipation or diarrhea.  She does report some urinary frequency but denies dysuria.  Denies leg pain or leg swelling and reports that she has not concerned about DVT given her lack of leg pain, leg swelling, or pleuritic chest pain.  She reports that she gets pneumonias about once a year and this feels similar to her previous pneumonias.  She denies other complaints on arrival.  On exam, lungs had rhonchi worse in the left lower lobe in the bases.  Chest nontender.  Abdomen nontender.  Good pulses in extremities.  Back and flanks nontender.  Oxygen saturations were about 90% when I evaluated patient.  Will get ambulatory pulse oximetry see if she gets hypoxic.  Clinically I am concerned about recurrent pneumonia.  Given her lack of tachycardia, pleuritic chest pain, or leg symptoms we agreed together to hold on thromboembolic workup given her contrast allergy and our low suspicion that is causing her symptoms.  Will check for COVID/flu/RSV, will get x-ray, and get labs.  Anticipate reassessment after workup  to determine disposition.  10:20 AM Oxygen saturations have remained in the 90s and it did not go into the 80s.  She reports she would like to go home now.  X-ray does not show pneumonia however clinically with the coarseness, soft oxygen saturations, and history I am concerned about a clinical pneumonia.  Her labs are otherwise reassuring and we went through all of them together.  Will give prescription for antibiotics for suspected clinical pneumonia to take if symptoms do not improve in the next 24 to 40 hours.  Patient agrees with plan and will follow-up with PCP.  She and family no other questions or concerns and patient was discharged in stable condition.         Final Clinical Impression(s) / ED Diagnoses Final diagnoses:  Acute cough  Shortness of breath  Pneumonia due to infectious organism, unspecified laterality, unspecified part of lung    Rx / DC Orders ED Discharge Orders          Ordered    doxycycline (VIBRAMYCIN) 100 MG capsule  2 times daily        10/02/23 1022            Clinical Impression: 1. Acute cough   2. Shortness of breath   3. Pneumonia due to infectious organism, unspecified laterality, unspecified part of lung     Disposition: Discharge  Condition: Good  I have discussed the results, Dx and Tx plan with the pt(& family if present). He/she/they expressed understanding and agree(s) with the plan. Discharge instructions discussed at great length. Strict return precautions discussed and pt &/or family have verbalized understanding of the instructions. No further questions at time of discharge.    New Prescriptions   DOXYCYCLINE (VIBRAMYCIN) 100 MG CAPSULE    Take 1 capsule (100 mg total) by mouth 2 (two) times daily for 7 days.    Follow Up: Patrice Paradise, MD [address] [facility] [place] [address] [phone]     [facility] Emergency Department at [facility] [address] [place] [address] [phone]        Gyan Cambre, Canary Brim, MD 10/02/23 1023

## 2023-10-02 NOTE — ED Triage Notes (Signed)
Pt presents with cough and running nose X 1 week. Report the cough worsened last night and reports some SOB.

## 2023-10-02 NOTE — Discharge Instructions (Signed)
Your history, exam, workup today did not show concerning findings however clinically I am concerned about you developing recurrent pneumonia.  Your oxygen saturations remained in the 90s and we feel you are safe for discharge home at this time however if symptoms do not improve in the next day or so, consider starting the antibiotics I will printed prescription for and follow-up with your primary doctor.  If symptoms were to change or worsening had worsened breathing, return to the nearest emergency department as you may need reassessment or even admission if it does not get better.  Please rest and stay hydrated.

## 2023-10-02 NOTE — ED Notes (Signed)
Lisa Good's O2 sat on RA while ambulated from the bathroom =94%

## 2023-10-04 ENCOUNTER — Inpatient Hospital Stay: Payer: Medicare Other

## 2023-10-04 ENCOUNTER — Ambulatory Visit: Payer: Medicare Other | Admitting: Medical Oncology

## 2023-10-07 ENCOUNTER — Inpatient Hospital Stay: Payer: Medicare Other

## 2023-10-08 ENCOUNTER — Other Ambulatory Visit: Payer: Self-pay | Admitting: Family Medicine

## 2023-10-08 ENCOUNTER — Ambulatory Visit
Admission: RE | Admit: 2023-10-08 | Discharge: 2023-10-08 | Disposition: A | Discharge status: Home / Self Care | Payer: Medicare Other | Source: Ambulatory Visit | Attending: Family Medicine | Admitting: Family Medicine

## 2023-10-08 DIAGNOSIS — R1013 Epigastric pain: Secondary | ICD-10-CM | POA: Diagnosis present

## 2023-10-08 DIAGNOSIS — R509 Fever, unspecified: Secondary | ICD-10-CM | POA: Insufficient documentation

## 2023-10-08 DIAGNOSIS — R112 Nausea with vomiting, unspecified: Secondary | ICD-10-CM

## 2023-10-15 ENCOUNTER — Inpatient Hospital Stay: Payer: Medicare Other | Attending: Medical Oncology

## 2023-10-15 ENCOUNTER — Inpatient Hospital Stay: Payer: Medicare Other

## 2023-10-15 VITALS — BP 137/62 | HR 90 | Temp 97.6°F | Resp 18

## 2023-10-15 DIAGNOSIS — K922 Gastrointestinal hemorrhage, unspecified: Secondary | ICD-10-CM | POA: Insufficient documentation

## 2023-10-15 DIAGNOSIS — D500 Iron deficiency anemia secondary to blood loss (chronic): Secondary | ICD-10-CM | POA: Insufficient documentation

## 2023-10-15 MED ORDER — IRON SUCROSE 20 MG/ML IV SOLN
300.0000 mg | Freq: Once | INTRAVENOUS | Status: AC
  Administered 2023-10-15: 300 mg via INTRAVENOUS
  Filled 2023-10-15: qty 300

## 2023-10-15 MED ORDER — SODIUM CHLORIDE 0.9 % IV SOLN
INTRAVENOUS | Status: DC
Start: 2023-10-15 — End: 2023-10-15

## 2023-10-15 NOTE — Patient Instructions (Signed)
 [facility] - A DEPT OF [facility]  Discharge Instructions: Thank you for choosing [facility] to provide your oncology and hematology care.   If you have a lab appointment with the Cancer Center, please go directly to the Cancer Center and check in at the registration area.  Wear comfortable clothing and clothing appropriate for easy access to any Portacath or PICC line.   We strive to give you quality time with your provider. You may need to reschedule your appointment if you arrive late (15 or more minutes).  Arriving late affects you and other patients whose appointments are after yours.  Also, if you miss three or more appointments without notifying the office, you may be dismissed from the clinic at the provider's discretion.      For prescription refill requests, have your pharmacy contact our office and allow 72 hours for refills to be completed.    Today you received the following chemotherapy and/or immunotherapy agents Venofer      To help prevent nausea and vomiting after your treatment, we encourage you to take your nausea medication as directed.  BELOW ARE SYMPTOMS THAT SHOULD BE REPORTED IMMEDIATELY: *FEVER GREATER THAN 100.4 F (38 C) OR HIGHER *CHILLS OR SWEATING *NAUSEA AND VOMITING THAT IS NOT CONTROLLED WITH YOUR NAUSEA MEDICATION *UNUSUAL SHORTNESS OF BREATH *UNUSUAL BRUISING OR BLEEDING *URINARY PROBLEMS (pain or burning when urinating, or frequent urination) *BOWEL PROBLEMS (unusual diarrhea, constipation, pain near the anus) TENDERNESS IN MOUTH AND THROAT WITH OR WITHOUT PRESENCE OF ULCERS (sore throat, sores in mouth, or a toothache) UNUSUAL RASH, SWELLING OR PAIN  UNUSUAL VAGINAL DISCHARGE OR ITCHING   Items with * indicate a potential emergency and should be followed up as soon as possible or go to the Emergency Department if any problems should occur.  Please show the CHEMOTHERAPY ALERT CARD or IMMUNOTHERAPY  ALERT CARD at check-in to the Emergency Department and triage nurse. Should you have questions after your visit or need to cancel or reschedule your appointment, please contact [facility] - A DEPT OF [facility]  [phone] and follow the prompts.  Office hours are 8:00 a.m. to 4:30 p.m. Monday - Friday. Please note that voicemails left after 4:00 p.m. may not be returned until the following business day.  We are closed weekends and major holidays. You have access to a nurse at all times for urgent questions. Please call the main number to the clinic [phone] and follow the prompts.  For any non-urgent questions, you may also contact your provider using MyChart. We now offer e-Visits for anyone 75 and older to request care online for non-urgent symptoms. For details visit [email or web address].   Also download the MyChart app! Go to the app store, search "MyChart", open the app, select , and log in with your MyChart username and password.

## 2023-10-18 ENCOUNTER — Other Ambulatory Visit: Payer: Medicare Other

## 2023-10-18 ENCOUNTER — Ambulatory Visit: Payer: Medicare Other | Admitting: Urology

## 2023-10-18 ENCOUNTER — Ambulatory Visit: Payer: Medicare Other | Admitting: Medical Oncology

## 2023-10-23 ENCOUNTER — Encounter: Payer: Self-pay | Admitting: Neurosurgery

## 2023-10-25 ENCOUNTER — Other Ambulatory Visit: Payer: Self-pay | Admitting: Family Medicine

## 2023-10-25 DIAGNOSIS — G894 Chronic pain syndrome: Secondary | ICD-10-CM

## 2023-11-01 ENCOUNTER — Inpatient Hospital Stay: Payer: Medicare Other | Admitting: Medical Oncology

## 2023-11-01 ENCOUNTER — Inpatient Hospital Stay: Payer: Medicare Other

## 2023-11-08 ENCOUNTER — Telehealth: Payer: Self-pay | Admitting: Neurosurgery

## 2023-11-08 NOTE — Telephone Encounter (Signed)
Patient has been trying to get an appointment with [facility] in [facility] but their is no referral placed, please advise and I will get that faxed to their office.

## 2023-11-08 NOTE — Telephone Encounter (Signed)
DISREGARD.  Misunderstanding between me and their office- referral has been refaxed to their office- Dr.Moser

## 2023-11-10 ENCOUNTER — Inpatient Hospital Stay: Payer: Medicare Other | Attending: Medical Oncology

## 2023-11-10 ENCOUNTER — Inpatient Hospital Stay (HOSPITAL_BASED_OUTPATIENT_CLINIC_OR_DEPARTMENT_OTHER): Payer: Medicare Other | Admitting: Medical Oncology

## 2023-11-10 VITALS — BP 159/65 | HR 102 | Temp 98.9°F | Resp 18 | Ht 63.0 in | Wt 199.0 lb

## 2023-11-10 DIAGNOSIS — Z8719 Personal history of other diseases of the digestive system: Secondary | ICD-10-CM | POA: Diagnosis not present

## 2023-11-10 DIAGNOSIS — E538 Deficiency of other specified B group vitamins: Secondary | ICD-10-CM | POA: Diagnosis not present

## 2023-11-10 DIAGNOSIS — N189 Chronic kidney disease, unspecified: Secondary | ICD-10-CM | POA: Insufficient documentation

## 2023-11-10 DIAGNOSIS — R7989 Other specified abnormal findings of blood chemistry: Secondary | ICD-10-CM

## 2023-11-10 DIAGNOSIS — D500 Iron deficiency anemia secondary to blood loss (chronic): Secondary | ICD-10-CM | POA: Insufficient documentation

## 2023-11-10 DIAGNOSIS — K922 Gastrointestinal hemorrhage, unspecified: Secondary | ICD-10-CM | POA: Insufficient documentation

## 2023-11-10 LAB — CMP (CANCER CENTER ONLY)
ALT: 11 U/L (ref 0–44)
AST: 17 U/L (ref 15–41)
Albumin: 4.1 g/dL (ref 3.5–5.0)
Alkaline Phosphatase: 106 U/L (ref 38–126)
Anion gap: 10 (ref 5–15)
BUN: 42 mg/dL — ABNORMAL HIGH (ref 8–23)
CO2: 32 mmol/L (ref 22–32)
Calcium: 9.6 mg/dL (ref 8.9–10.3)
Chloride: 103 mmol/L (ref 98–111)
Creatinine: 1.58 mg/dL — ABNORMAL HIGH (ref 0.44–1.00)
GFR, Estimated: 34 mL/min — ABNORMAL LOW (ref >60)
Glucose, Bld: 94 mg/dL (ref 70–99)
Potassium: 4.5 mmol/L (ref 3.5–5.1)
Sodium: 145 mmol/L (ref 135–145)
Total Bilirubin: 0.4 mg/dL (ref 0.0–1.2)
Total Protein: 6.4 g/dL — ABNORMAL LOW (ref 6.5–8.1)

## 2023-11-10 LAB — CBC WITH DIFFERENTIAL (CANCER CENTER ONLY)
Abs Immature Granulocytes: 0.08 10*3/uL — ABNORMAL HIGH (ref 0.00–0.07)
Basophils Absolute: 0.1 10*3/uL (ref 0.0–0.1)
Basophils Relative: 1 %
Eosinophils Absolute: 0.2 10*3/uL (ref 0.0–0.5)
Eosinophils Relative: 2 %
HCT: 42 % (ref 36.0–46.0)
Hemoglobin: 13.1 g/dL (ref 12.0–15.0)
Immature Granulocytes: 1 %
Lymphocytes Relative: 27 %
Lymphs Abs: 2.2 10*3/uL (ref 0.7–4.0)
MCH: 28.4 pg (ref 26.0–34.0)
MCHC: 31.2 g/dL (ref 30.0–36.0)
MCV: 91.1 fL (ref 80.0–100.0)
Monocytes Absolute: 0.8 10*3/uL (ref 0.1–1.0)
Monocytes Relative: 9 %
Neutro Abs: 4.9 10*3/uL (ref 1.7–7.7)
Neutrophils Relative %: 60 %
Platelet Count: 310 10*3/uL (ref 150–400)
RBC: 4.61 MIL/uL (ref 3.87–5.11)
RDW: 15.7 % — ABNORMAL HIGH (ref 11.5–15.5)
WBC Count: 8.2 10*3/uL (ref 4.0–10.5)
nRBC: 0 % (ref 0.0–0.2)

## 2023-11-10 LAB — RETIC PANEL
Immature Retic Fract: 14.2 % (ref 2.3–15.9)
RBC.: 4.65 MIL/uL (ref 3.87–5.11)
Retic Count, Absolute: 94.9 10*3/uL (ref 19.0–186.0)
Retic Ct Pct: 2 % (ref 0.4–3.1)
Reticulocyte Hemoglobin: 31.1 pg (ref >27.9)

## 2023-11-10 LAB — IRON AND IRON BINDING CAPACITY (CC-WL,HP ONLY)
Iron: 48 ug/dL (ref 28–170)
Saturation Ratios: 19 % (ref 10.4–31.8)
TIBC: 258 ug/dL (ref 250–450)
UIBC: 210 ug/dL (ref 148–442)

## 2023-11-10 LAB — FERRITIN: Ferritin: 96 ng/mL (ref 11–307)

## 2023-11-10 NOTE — Progress Notes (Signed)
 Hematology and Oncology Follow Up Visit  Lisa Good [id number] 1951/04/25 73 y.o. 11/10/2023  Past Medical History:  Diagnosis Date   Allergy    seasonal   Arthritis    Cataract    Cerebral aneurysm    Chronic kidney disease    Chronic pain syndrome    Complication of anesthesia    trouble waking up after back surgery   DDD (degenerative disc disease), lumbar    Depression    DVT (deep venous thrombosis) (HCC) 2002   Fibromyalgia    GERD (gastroesophageal reflux disease)    Heart murmur    Heart dr said normal for me   History of kidney stones    Hyperlipidemia    Hypertension    Hypothyroidism    Incontinence in female    Iron  deficiency anemia 11/01/2019   Irregular heart beat    Neuromuscular disorder (HCC)    peripheral neuropathy/feet   Obesity    OSA on CPAP    Pancolitis (HCC)    Pre-diabetes    RLS (restless legs syndrome)    UTI (urinary tract infection) 11/2022    Principle Diagnosis:  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 CKD  Current Therapy:   Venofer  300 mg - last infusion 06/17/2023  Prior Work Up:  She had an endoscopy/colonoscopy on 06/13/2023. Two polyps were collected that were benign. No bleeding was found.     Interim History:  Ms. Gregg is back for follow-up for her IDA. She was first seen by our office on 05/13/2023. Labs from 03/29/2023 show an iron  of 19, iron  saturation of 4%, ferritin of 7 on 03/18/2023. Last CBC on 03/30/2023 showed a hemoglobin of 8.2, MCV of 80.6. She was started on Venofer  300 mg weekly x 3 weeks total. She is s/p Venofer  on 05/20/2023, 05/27/2023, 06/17/2023. She is here today for repeat labs and consideration of additional Venofer . She has tolerated the Venofer  well.    Today she states that her symptoms of SOB and fatigue have been worse given recent illness. She reports having bronchitis and pneumonia around christmas. She was slowly recovering and then got sick again about 2 weeks ago. Slowly improving.   There has  been no bleeding to her knowledge: denies epistaxis, gingivitis, hemoptysis, hematemesis, hematuria, melena, excessive bruising, blood donation.      Wt Readings from Last 3 Encounters:  11/10/23 199 lb (90.3 kg)  10/02/23 212 lb 15.4 oz (96.6 kg)  09/14/23 213 lb (96.6 kg)     Medications:   Current Outpatient Medications:    Alpha-Lipoic Acid 600 MG CAPS, Take 600 mg by mouth 2 (two) times daily., Disp: , Rfl:    docusate sodium  (COLACE) 50 MG capsule, Take 50 mg by mouth daily as needed., Disp: , Rfl:    gabapentin  (NEURONTIN ) 800 MG tablet, Take 1 tablet by mouth 4 (four) times daily., Disp: , Rfl:    lisinopril  (PRINIVIL ,ZESTRIL ) 40 MG tablet, Take 40 mg by mouth at bedtime., Disp: , Rfl:    meloxicam  (MOBIC ) 7.5 MG tablet, Take 7.5 mg by mouth daily., Disp: , Rfl:    methocarbamol  (ROBAXIN ) 500 MG tablet, Take 1 tablet (500 mg total) by mouth every 6 (six) hours as needed for muscle spasms., Disp: 40 tablet, Rfl: 0   nystatin-triamcinolone (MYCOLOG II) cream, Apply 1 Application topically as needed (under breast)., Disp: , Rfl:    pantoprazole  (PROTONIX ) 40 MG tablet, Take 40 mg by mouth every morning., Disp: , Rfl:    traZODone  (DESYREL ) 100  MG tablet, Take 100 mg by mouth at bedtime., Disp: , Rfl:    trimethoprim  (TRIMPEX ) 100 MG tablet, Take 1 tablet (100 mg total) by mouth daily., Disp: 90 tablet, Rfl: 3   venlafaxine  XR (EFFEXOR -XR) 150 MG 24 hr capsule, Take 150 mg by mouth at bedtime. Take with 75 mg to equal 225 mg daily, Disp: , Rfl:    venlafaxine  XR (EFFEXOR -XR) 75 MG 24 hr capsule, Take 75 mg by mouth at bedtime. Take with 150 mg to equal 225 mg daily, Disp: , Rfl:    XYLITOL MT, Use as directed 2 tablets in the mouth or throat daily as needed (dry mouth)., Disp: , Rfl:   Allergies:  Allergies  Allergen Reactions   Contrast Media [Iodinated Contrast Media] Hives   Duloxetine Swelling    Legs and feet   Pregabalin Swelling    Legs and feet   Iodine Hives    Clarithromycin Nausea Only   Spironolactone-Hctz Rash    Only with iv injection    Past Medical History, Surgical history, Social history, and Family History were reviewed and updated.  Review of Systems: As stated above in HPI   Physical Exam:  height is 5' 3 (1.6 m) and weight is 199 lb (90.3 kg). Her oral temperature is 98.9 F (37.2 C). Her blood pressure is 159/65 (abnormal) and her pulse is 102 (abnormal). Her respiration is 18 and oxygen saturation is 100%.   Physical Exam General: NAD. Uses a cane to ambulate  Cardiovascular: regular rate and rhythm Pulmonary: clear ant fields Abdomen: soft, nontender, + bowel sounds GU: no suprapubic tenderness Extremities: no edema, no joint deformities Skin: no rashes Neurological: Weakness but otherwise nonfocal   Lab Results  Component Value Date   WBC 8.2 11/10/2023   HGB 13.1 11/10/2023   HCT 42.0 11/10/2023   MCV 91.1 11/10/2023   PLT 310 11/10/2023     Chemistry      Component Value Date/Time   NA 145 11/10/2023 1251   K 4.5 11/10/2023 1251   CL 103 11/10/2023 1251   CO2 32 11/10/2023 1251   BUN 42 (H) 11/10/2023 1251   CREATININE 1.58 (H) 11/10/2023 1251      Component Value Date/Time   CALCIUM  9.6 11/10/2023 1251   ALKPHOS 106 11/10/2023 1251   AST 17 11/10/2023 1251   ALT 11 11/10/2023 1251   BILITOT 0.4 11/10/2023 1251     Encounter Diagnoses  Name Primary?   Iron  deficiency anemia due to chronic blood loss Yes   History of GI bleed    B12 deficiency    Elevated serum creatinine     Assessment and Plan- Patient is a 73 y.o. 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 UTD with Colo/endo. Currently s/p Venofer  300 mg.   Hgb greatly improved at 13.1 today. Creatinine is up again to 1.58. She appears to be a bit dehydrated as well. I have advised her to continue to see her PCP regarding these findings. Hydration recommended.  Iron  studied pending- Will supplement if needed.   Disposition: RTC 3 months  me, labs (CBC w/, CMP, iron , ferritin, retic,b12) -Simla   Lauraine Dais PA-C 2/5/20251:42 PM

## 2023-11-11 ENCOUNTER — Encounter: Payer: Self-pay | Admitting: Medical Oncology

## 2023-11-13 ENCOUNTER — Encounter: Payer: Self-pay | Admitting: Hematology & Oncology

## 2023-11-14 ENCOUNTER — Other Ambulatory Visit: Payer: Self-pay

## 2023-11-14 ENCOUNTER — Ambulatory Visit (INDEPENDENT_AMBULATORY_CARE_PROVIDER_SITE_OTHER): Payer: Medicare Other | Admitting: Radiology

## 2023-11-14 ENCOUNTER — Ambulatory Visit
Admission: RE | Admit: 2023-11-14 | Discharge: 2023-11-14 | Discharge status: Home / Self Care | Payer: Medicare Other | Source: Ambulatory Visit | Attending: Family Medicine

## 2023-11-14 VITALS — BP 154/86 | HR 99 | Temp 98.4°F | Resp 18

## 2023-11-14 DIAGNOSIS — R051 Acute cough: Secondary | ICD-10-CM | POA: Diagnosis not present

## 2023-11-14 DIAGNOSIS — R053 Chronic cough: Secondary | ICD-10-CM | POA: Diagnosis not present

## 2023-11-14 LAB — POC RSV: RSV Antigen, POC: NEGATIVE

## 2023-11-14 MED ORDER — BENZONATATE 200 MG PO CAPS: 200.0000 mg | ORAL_CAPSULE | Freq: Three times a day (TID) | ORAL | 0 refills | Status: AC | PRN

## 2023-11-14 MED ORDER — ALBUTEROL SULFATE HFA 108 (90 BASE) MCG/ACT IN AERS: 2.0000 | INHALATION_SPRAY | Freq: Four times a day (QID) | RESPIRATORY_TRACT | 0 refills | Status: DC | PRN

## 2023-11-14 NOTE — ED Triage Notes (Addendum)
 Pt presents with complaints of congestion, productive cough, sore throat (tongue is sore), and bilateral ear pain. Pt states her symptoms began in mid-December, diagnosed with pneumonia + bronchitis, symptoms never did improve. Pt is currently taking Mucinex  with no relief. Pt was prescribed antibiotic, steroid, and tessalon  pearls in past few weeks. Stopped taking prednisone  on day 3 as it made her sick as a dog. Denies fevers. Pt's family member is hospitalized with RSV at this time.

## 2023-11-14 NOTE — ED Provider Notes (Signed)
 [facility]    CSN: [id number] Arrival date & time: 11/14/23  0946      History   Chief Complaint Chief Complaint  Patient presents with   Cough    Ears and throat hurt also - Entered by patient    HPI Oneta Pouliot is a 73 y.o. female.    Cough Has had a cough since the end of December, has been seen several times for same, seen 11/02/2018 Diagnosed with sinusitis treated with doxycycline  prednisone  and Tessalon  Perles.  States she stopped the prednisone  because it made her nauseous.  Denies improvement while on these medications.  Admits a lot of chest congestion but denies sputum production.  Has ear fullness and popping.  Denies rhinorrhea, nasal congestion, sore throat.  States her tongue has been sore.    Past Medical History:  Diagnosis Date   Allergy    seasonal   Arthritis    Cataract    Cerebral aneurysm    Chronic kidney disease    Chronic pain syndrome    Complication of anesthesia    trouble waking up after back surgery   DDD (degenerative disc disease), lumbar    Depression    DVT (deep venous thrombosis) (HCC) 2002   Fibromyalgia    GERD (gastroesophageal reflux disease)    Heart murmur    Heart dr said normal for me   History of kidney stones    Hyperlipidemia    Hypertension    Hypothyroidism    Incontinence in female    Iron  deficiency anemia 11/01/2019   Irregular heart beat    Neuromuscular disorder (HCC)    peripheral neuropathy/feet   Obesity    OSA on CPAP    Pancolitis (HCC)    Pre-diabetes    RLS (restless legs syndrome)    UTI (urinary tract infection) 11/2022    Patient Active Problem List   Diagnosis Date Noted   Battery end of life of spinal cord stimulator 02/01/2023   Malfunction of spinal cord stimulator (HCC) 02/01/2023   Anxiety and depression 01/21/2023   Chronic right shoulder pain 11/11/2020   Right rotator cuff tear arthropathy 11/11/2020   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   Chronic radicular  lumbar pain 12/04/2019   Iron  deficiency anemia 11/01/2019   Primary osteoarthritis of left knee 10/17/2019   Primary osteoarthritis of right shoulder 10/17/2019   Hx of cervical spine surgery 08/21/2019   Pancolitis (HCC) 07/20/2019   Kyphosis of cervical region 07/14/2019   Cervical spondylosis with myelopathy 06/28/2019   Chronic low back pain 06/28/2019   Cervical spondylosis without myelopathy 11/09/2018   Foraminal stenosis of cervical region 11/09/2018   Cervical stenosis of spinal canal 11/09/2018   History of fusion of lumbar spine 11/09/2018   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   Postlaminectomy syndrome of lumbosacral region 11/09/2018   Chronic pain syndrome 11/09/2018   Severe obesity (BMI 35.0-39.9) with comorbidity (HCC) 08/18/2018   Primary osteoarthritis of right knee 02/17/2017   Incontinence in female 10/05/2016   Spondylolisthesis, lumbar region 05/05/2016   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   OSA (obstructive sleep apnea) 05/31/2015   Hypothyroidism 02/09/2014   Sleep disorder 02/09/2014   Asymptomatic hyperuricemia 08/29/2013   Polyarthritis 08/29/2013   Essential hypertension 06/28/2013   SUI (stress urinary incontinence, female) 06/20/2013   Insomnia 05/22/2013   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   Peripheral neuropathy 05/25/2012   Venous (peripheral) insufficiency 05/25/2012   Restless legs syndrome (RLS) 10/03/2009  Fibromyalgia 02/18/2009   Gastro-esophageal reflux disease with esophagitis 10/23/2008   GERD (gastroesophageal reflux disease) 10/23/2008   Pure hypercholesterolemia 10/23/2008    Past Surgical History:  Procedure Laterality Date   BIOPSY  06/23/2023   Procedure: BIOPSY;  Surgeon: Aundria, Ladell POUR, MD;  Location: [facility] ENDOSCOPY;  Service: Gastroenterology;;   BRAIN SURGERY  2002   aneurysm   CEREBRAL ANEURYSM REPAIR  2002   COLONOSCOPY WITH PROPOFOL  N/A 11/10/2019   Procedure: COLONOSCOPY WITH  PROPOFOL ;  Surgeon: Therisa Bi, MD;  Location: [facility] SURGERY [facility];  Service: Endoscopy;  Laterality: N/A;   COLONOSCOPY WITH PROPOFOL  N/A 06/23/2023   Procedure: COLONOSCOPY WITH PROPOFOL ;  Surgeon: Toledo, Ladell POUR, MD;  Location: [facility] ENDOSCOPY;  Service: Gastroenterology;  Laterality: N/A;   COSMETIC SURGERY     ESOPHAGOGASTRODUODENOSCOPY (EGD) WITH PROPOFOL  N/A 11/10/2019   Procedure: ESOPHAGOGASTRODUODENOSCOPY (EGD) WITH PROPOFOL ;  Surgeon: Therisa Bi, MD;  Location: [facility] SURGERY CNTR;  Service: Endoscopy;  Laterality: N/A;   ESOPHAGOGASTRODUODENOSCOPY (EGD) WITH PROPOFOL  N/A 06/23/2023   Procedure: ESOPHAGOGASTRODUODENOSCOPY (EGD) WITH PROPOFOL ;  Surgeon: Toledo, Ladell POUR, MD;  Location: [facility] ENDOSCOPY;  Service: Gastroenterology;  Laterality: N/A;   EXTRACORPOREAL SHOCK WAVE LITHOTRIPSY Right 04/27/2019   Procedure: EXTRACORPOREAL SHOCK WAVE LITHOTRIPSY (ESWL);  Surgeon: Francisca Redell BROCKS, MD;  Location: [facility] ORS;  Service: Urology;  Laterality: Right;   EYE SURGERY     JOINT REPLACEMENT Bilateral    knee   KNEE ARTHROPLASTY Left 01/12/2020   Procedure: COMPUTER ASSISTED TOTAL KNEE ARTHROPLASTY;  Surgeon: Mardee Lynwood SQUIBB, MD;  Location: [facility] ORS;  Service: Orthopedics;  Laterality: Left;   MALONEY DILATION  06/23/2023   Procedure: MALONEY DILATION;  Surgeon: Aundria, Ladell POUR, MD;  Location: [facility] ENDOSCOPY;  Service: Gastroenterology;;   POLYPECTOMY  11/10/2019   Procedure: POLYPECTOMY;  Surgeon: Therisa Bi, MD;  Location: [facility]ERY [facility];  Service: Endoscopy;;   REVERSE SHOULDER ARTHROPLASTY Right 01/06/2022   Procedure: REVERSE SHOULDER ARTHROPLASTY;  Surgeon: Edie Norleen PARAS, MD;  Location: [facility] ORS;  Service: Orthopedics;  Laterality: Right;   SPINAL CORD STIMULATOR BATTERY EXCHANGE N/A 02/01/2023   Procedure: INTERNAL PULSE GENERATOR REPLACEMENT ([employer]);  Surgeon: Clois Fret, MD;  Location: [facility] ORS;  Service: Neurosurgery;  Laterality: N/A;  LOCAL WITH MAC    SPINAL FUSION  07/10/2019   C4-5 corpectomy with C6-7 ACDF, C2-T3 spinal fusion   SPINE SURGERY     spinal fusion   THORACIC LAMINECTOMY FOR SPINAL CORD STIMULATOR N/A 12/25/2022   Procedure: THORACIC LAMINECTOMY FOR SPINAL CORD STIMULATOR IMPLANTATION ([employer]);  Surgeon: Clois Fret, MD;  Location: [facility] ORS;  Service: Neurosurgery;  Laterality: N/A;   TUBAL LIGATION      OB History   No obstetric history on file.      Home Medications    Prior to Admission medications   Medication Sig Start Date End Date Taking? Authorizing Provider  benzonatate  (TESSALON ) 200 MG capsule Take by mouth. 11/03/23  Yes [provider]  Alpha-Lipoic Acid 600 MG CAPS Take 600 mg by mouth 2 (two) times daily.    [provider]  docusate sodium  (COLACE) 50 MG capsule Take 50 mg by mouth daily as needed.    [provider]  gabapentin  (NEURONTIN ) 800 MG tablet Take 1 tablet by mouth 4 (four) times daily. 09/18/16   [provider]  lisinopril  (PRINIVIL ,ZESTRIL ) 40 MG tablet Take 40 mg by mouth at bedtime.    [provider]  meloxicam  (MOBIC ) 7.5 MG tablet Take 7.5 mg by  mouth daily. 08/24/23 08/23/24  [provider]  methocarbamol  (ROBAXIN ) 500 MG tablet Take 1 tablet (500 mg total) by mouth every 6 (six) hours as needed for muscle spasms. 08/19/23   Hilma Hastings, PA-C  nystatin-triamcinolone (MYCOLOG II) cream Apply 1 Application topically as needed (under breast). 03/18/23 03/17/24  [provider]  pantoprazole  (PROTONIX ) 40 MG tablet Take 40 mg by mouth every morning. 11/17/21   [provider]  traZODone  (DESYREL ) 100 MG tablet Take 100 mg by mouth at bedtime.    [provider]  trimethoprim  (TRIMPEX ) 100 MG tablet Take 1 tablet (100 mg total) by mouth daily. 06/14/23   MacDiarmid, Scott, MD  venlafaxine  XR (EFFEXOR -XR) 150 MG 24 hr capsule Take 150 mg by mouth at bedtime. Take with 75 mg to equal 225 mg  daily 10/26/19   [provider]  venlafaxine  XR (EFFEXOR -XR) 75 MG 24 hr capsule Take 75 mg by mouth at bedtime. Take with 150 mg to equal 225 mg daily 10/16/21   [provider]  XYLITOL MT Use as directed 2 tablets in the mouth or throat daily as needed (dry mouth).    [provider]    Family History Family History  Problem Relation Age of Onset   Hypertension Mother    Depression Mother    Diabetes Mother    Varicose Veins Mother    Hyperlipidemia Sister    Hypertension Sister    Depression Sister    Varicose Veins Sister    Depression Brother    Arthritis Maternal Grandmother    Diabetes Maternal Grandmother    Sleep apnea Neg Hx     Social History Social History   Tobacco Use   Smoking status: Former    Current packs/day: 0.00    Average packs/day: 1.5 packs/day for 20.0 years (30.0 ttl pk-yrs)    Types: Cigarettes    Start date: 10/29/1984    Quit date: 10/29/2004    Years since quitting: 19.0    Passive exposure: Past   Smokeless tobacco: Never  Vaping Use   Vaping status: Never Used  Substance Use Topics   Alcohol use: Not Currently    Comment: Rare social drinking   Drug use: Never     Allergies   Contrast media [iodinated contrast media], Duloxetine, Pregabalin, Iodine, Clarithromycin, and Spironolactone-hctz   Review of Systems Review of Systems  Respiratory:  Positive for cough.      Physical Exam Triage Vital Signs ED Triage Vitals  Encounter Vitals Group     BP 11/14/23 1010 (!) 154/86     Systolic BP Percentile --      Diastolic BP Percentile --      Pulse Rate 11/14/23 1010 99     Resp 11/14/23 1010 18     Temp 11/14/23 1010 98.4 F (36.9 C)     Temp Source 11/14/23 1010 Oral     SpO2 11/14/23 1010 95 %     Weight --      Height --      Head Circumference --      Peak Flow --      Pain Score 11/14/23 1007 7     Pain Loc --      Pain Education --      Exclude from Growth Chart --    No data  found.  Updated Vital Signs BP (!) 154/86 (BP Location: Right Arm)   Pulse 99   Temp 98.4 F (36.9 C) (Oral)  Resp 18   SpO2 95%   Visual Acuity Right Eye Distance:   Left Eye Distance:   Bilateral Distance:    Right Eye Near:   Left Eye Near:    Bilateral Near:     Physical Exam Vitals and nursing note reviewed.  Constitutional:      Appearance: She is not ill-appearing.  HENT:     Head: Normocephalic.     Right Ear: Tympanic membrane and ear canal normal.     Left Ear: Tympanic membrane and ear canal normal.     Nose: No rhinorrhea.     Mouth/Throat:     Mouth: Mucous membranes are moist.  Cardiovascular:     Rate and Rhythm: Normal rate and regular rhythm.  Pulmonary:     Effort: Pulmonary effort is normal.     Breath sounds: Normal breath sounds. No wheezing, rhonchi or rales.  Musculoskeletal:     Right lower leg: No edema.  Neurological:     Mental Status: She is alert and oriented to person, place, and time.  Psychiatric:        Mood and Affect: Mood normal.      UC Treatments / Results  Labs (all labs ordered are listed, but only abnormal results are displayed) Labs Reviewed  POC RSV    EKG   Radiology No results found.  Procedures Procedures (including critical care time)  Medications Ordered in UC Medications - No data to display  Initial Impression / Assessment and Plan / UC Course  I have reviewed the triage vital signs and the nursing notes.  Pertinent labs & imaging results that were available during my care of the patient were reviewed by me and considered in my medical decision making (see chart for details).     73 year old female with cough for more than 6 weeks, she is mildly hypertensive 154/86 otherwise vital signs are stable.  She is coughing frequently.  Her lungs are clear to auscultation without rales rhonchi or wheezes.  Chest x-ray independently viewed by me mild cardiomegaly, scarring left, no acute infiltrate.  EKG  obtained independently viewed by me normal sinus rhythm, left atrial enlargement, anterior infarct age-indeterminate no acute ischemic changes EKG similar to EKG dated 12/25/2022.  She had a recent exposure to RSV.  Her RSV test is negative Causes of chronic cough reviewed with patient, she does take lisinopril  discussed with patient her cough may be a side effect of her medicine she should see her PCP to discuss possibly changing that.  She does have reflux recommend she continue to take her reflux medications, no history of asthma.  Was recently treated for atypical infection. Will treat with albuterol  inhaler and refill benzonatate .  Follow-up with PCP Final Clinical Impressions(s) / UC Diagnoses   Final diagnoses:  Acute cough   Discharge Instructions   None    ED Prescriptions   None    PDMP not reviewed this encounter.   Milynn Quirion, [place] 11/14/23 1110

## 2023-11-14 NOTE — Discharge Instructions (Addendum)
 Follow-up with your primary care doctor for further evaluation of your chronic cough The x-ray reading we discussed is preliminary. Your x-ray will be read by a radiologist in next few hours. If there is a discrepancy, you will be contacted, and instructed on a new plan for you care.

## 2023-11-23 ENCOUNTER — Encounter: Payer: Self-pay | Admitting: Hematology & Oncology

## 2023-12-06 ENCOUNTER — Ambulatory Visit: Payer: Self-pay | Admitting: Urology

## 2023-12-09 ENCOUNTER — Encounter: Payer: Self-pay | Admitting: Urology

## 2023-12-10 ENCOUNTER — Ambulatory Visit: Admission: EM | Admit: 2023-12-10 | Discharge: 2023-12-10 | Disposition: A | Discharge status: Home / Self Care

## 2023-12-10 DIAGNOSIS — R053 Chronic cough: Secondary | ICD-10-CM | POA: Diagnosis not present

## 2023-12-10 MED ORDER — AMOXICILLIN 875 MG PO TABS
875.0000 mg | ORAL_TABLET | Freq: Two times a day (BID) | ORAL | 0 refills | Status: AC
Start: 2023-12-10 — End: 2023-12-17

## 2023-12-10 NOTE — ED Provider Notes (Addendum)
 [facility]    CSN: [id number] Arrival date & time: 12/10/23  1644      History   Chief Complaint Chief Complaint  Patient presents with   Cough    HPI Lisa Good is a 73 y.o. female.   Patient presents with persistent, dry cough that has been present since around December 2024.  Patient has been seen multiple times where she has been prescribed doxycycline, Augmentin, prednisone, albuterol inhaler, Wixela inhaler.  Reports that she has not seen any improvement.  She has also been prescribed benzonatate and has been taking Mucinex with no improvement.  Patient denies any formal diagnosis of asthma or COPD.  She was a previous cigarette smoker.  Patient states that she did not tolerate Augmentin or doxycycline well so she only took a few of the pills.  She has had 2 chest x-rays which did not show any obvious acute cardiopulmonary process.   Cough   Past Medical History:  Diagnosis Date   Allergy    seasonal   Arthritis    Cataract    Cerebral aneurysm    Chronic kidney disease    Chronic pain syndrome    Complication of anesthesia    trouble waking up after back surgery   DDD (degenerative disc disease), lumbar    Depression    DVT (deep venous thrombosis) (HCC) 2002   Fibromyalgia    GERD (gastroesophageal reflux disease)    Heart murmur    Heart dr said normal for me   History of kidney stones    Hyperlipidemia    Hypertension    Hypothyroidism    Incontinence in female    Iron deficiency anemia 11/01/2019   Irregular heart beat    Neuromuscular disorder (HCC)    peripheral neuropathy/feet   Obesity    OSA on CPAP    Pancolitis (HCC)    Pre-diabetes    RLS (restless legs syndrome)    UTI (urinary tract infection) 11/2022    Patient Active Problem List   Diagnosis Date Noted   Opioid use, unspecified with unspecified opioid-induced disorder (HCC) 07/20/2023   Occult blood in stools 04/01/2023   Pharyngeal dysphagia 04/01/2023   Battery  end of life of spinal cord stimulator 02/01/2023   Malfunction of spinal cord stimulator (HCC) 02/01/2023   Anxiety and depression 01/21/2023   Arthropathy of thoracic facet joint 05/15/2022   Opioid type dependence, continuous (HCC) 05/15/2022   Thoracic spondylosis without myelopathy 05/15/2022   Prediabetes 03/10/2022   Status post reverse total shoulder replacement, right 01/12/2022   Rotator cuff tendinitis, right 12/15/2021   Chronic constipation 12/12/2020   Generalized bloating 12/12/2020   Long term current use of opiate analgesic 12/12/2020   Tubular adenoma of colon 12/12/2020   Chronic right shoulder pain 11/11/2020   Right rotator cuff tear arthropathy 11/11/2020   Degeneration of lumbosacral intervertebral disc with acute herniation 07/08/2020   Intermittent palpitations 06/14/2020   Urinary incontinence, urge 06/14/2020   CKD (chronic kidney disease) stage 3, GFR 30-59 ml/min (HCC) 06/13/2020   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   Chronic radicular lumbar pain 12/04/2019   Iron deficiency anemia 11/01/2019   Primary osteoarthritis of left knee 10/17/2019   Primary osteoarthritis of right shoulder 10/17/2019   Hx of cervical spine surgery 08/21/2019   Pancolitis (HCC) 07/20/2019   Kyphosis of cervical region 07/14/2019   Cervical spondylosis with myelopathy 06/28/2019   Chronic low back pain 06/28/2019   Cervical spondylosis without myelopathy 11/09/2018  Foraminal stenosis of cervical region 11/09/2018   Cervical stenosis of spinal canal 11/09/2018   History of fusion of lumbar spine 11/09/2018   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   Postlaminectomy syndrome of lumbosacral region 11/09/2018   Chronic pain syndrome 11/09/2018   Severe obesity (BMI 35.0-39.9) with comorbidity (HCC) 08/18/2018   Primary osteoarthritis of right knee 02/17/2017   Incontinence in female 10/05/2016   Spondylolisthesis, lumbar region 05/05/2016    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   Lumbar spondylosis 09/10/2015   OSA (obstructive sleep apnea) 05/31/2015   Hypothyroidism 02/09/2014   Sleep disorder 02/09/2014   Asymptomatic hyperuricemia 08/29/2013   Polyarthritis 08/29/2013   Essential hypertension 06/28/2013   SUI (stress urinary incontinence, female) 06/20/2013   Insomnia 05/22/2013   Primary osteoarthritis involving multiple joints 11/22/2012   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   Peripheral neuropathy 05/25/2012   Venous (peripheral) insufficiency 05/25/2012   Seasonal allergic rhinitis due to pollen 12/23/2009   Restless legs syndrome (RLS) 10/03/2009   Recurrent major depressive disorder (HCC) 10/03/2009   Fibromyalgia 02/18/2009   Gastro-esophageal reflux disease with esophagitis 10/23/2008   GERD (gastroesophageal reflux disease) 10/23/2008   Pure hypercholesterolemia 10/23/2008    Past Surgical History:  Procedure Laterality Date   BIOPSY  06/23/2023   Procedure: BIOPSY;  Surgeon: Norma Fredrickson, Boykin Nearing, MD;  Location: [facility];  Service: Gastroenterology;;   BRAIN SURGERY  2002   aneurysm   CEREBRAL ANEURYSM REPAIR  2002   COLONOSCOPY WITH PROPOFOL N/A 11/10/2019   Procedure: COLONOSCOPY WITH PROPOFOL;  Surgeon: Wyline Mood, MD;  Location: [facility];  Service: Endoscopy;  Laterality: N/A;   COLONOSCOPY WITH PROPOFOL N/A 06/23/2023   Procedure: COLONOSCOPY WITH PROPOFOL;  Surgeon: Toledo, Boykin Nearing, MD;  Location: [facility] ENDOSCOPY;  Service: Gastroenterology;  Laterality: N/A;   COSMETIC SURGERY     ESOPHAGOGASTRODUODENOSCOPY (EGD) WITH PROPOFOL N/A 11/10/2019   Procedure: ESOPHAGOGASTRODUODENOSCOPY (EGD) WITH PROPOFOL;  Surgeon: Wyline Mood, MD;  Location: [facility];  Service: Endoscopy;  Laterality: N/A;   ESOPHAGOGASTRODUODENOSCOPY (EGD) WITH PROPOFOL N/A 06/23/2023   Procedure: ESOPHAGOGASTRODUODENOSCOPY (EGD) WITH PROPOFOL;  Surgeon: Toledo, Boykin Nearing, MD;  Location: [facility] ENDOSCOPY;  Service:  Gastroenterology;  Laterality: N/A;   EXTRACORPOREAL SHOCK WAVE LITHOTRIPSY Right 04/27/2019   Procedure: EXTRACORPOREAL SHOCK WAVE LITHOTRIPSY (ESWL);  Surgeon: Sondra Come, MD;  Location: [facility] ORS;  Service: Urology;  Laterality: Right;   EYE SURGERY     JOINT REPLACEMENT Bilateral    knee   KNEE ARTHROPLASTY Left 01/12/2020   Procedure: COMPUTER ASSISTED TOTAL KNEE ARTHROPLASTY;  Surgeon: Donato Heinz, MD;  Location: [facility] ORS;  Service: Orthopedics;  Laterality: Left;   MALONEY DILATION  06/23/2023   Procedure: MALONEY DILATION;  Surgeon: Norma Fredrickson, Boykin Nearing, MD;  Location[facility];  Service: Gastroenterology;;   POLYPECTOMY  11/10/2019   Procedure: POLYPECTOMY;  Surgeon: Wyline Mood, MD;  Location: [facility];  Service: Endoscopy;;   REVERSE SHOULDER ARTHROPLASTY Right 01/06/2022   Procedure: REVERSE SHOULDER ARTHROPLASTY;  Surgeon: Christena Flake, MD;  Location: [facility] ORS;  Service: Orthopedics;  Laterality: Right;   SPINAL CORD STIMULATOR BATTERY EXCHANGE N/A 02/01/2023   Procedure: INTERNAL PULSE GENERATOR REPLACEMENT ([employer]);  Surgeon: Venetia Night, MD;  Location: [facility] ORS;  Service: Neurosurgery;  Laterality: N/A;  LOCAL WITH MAC   SPINAL FUSION  07/10/2019   C4-5 corpectomy with C6-7 ACDF, C2-T3 spinal fusion   SPINE SURGERY     spinal fusion   THORACIC LAMINECTOMY FOR SPINAL  CORD STIMULATOR N/A 12/25/2022   Procedure: THORACIC LAMINECTOMY FOR SPINAL CORD STIMULATOR IMPLANTATION ([employer]);  Surgeon: Venetia Night, MD;  Location: [facility] ORS;  Service: Neurosurgery;  Laterality: N/A;   TUBAL LIGATION      OB History   No obstetric history on file.      Home Medications    Prior to Admission medications   Medication Sig Start Date End Date Taking? Authorizing Provider  amoxicillin (AMOXIL) 875 MG tablet Take 1 tablet (875 mg total) by mouth 2 (two) times daily for 7 days. 12/10/23 12/17/23 Yes Aiza Vollrath, Acie Fredrickson, FNP  benzonatate  (TESSALON) 200 MG capsule Take by mouth. 12/02/23  Yes [provider]  buprenorphine (BUTRANS) 10 MCG/HR PTWK Place onto the skin. 12/02/23  Yes [provider]  doxycycline (VIBRAMYCIN) 100 MG capsule Take 100 mg by mouth 2 (two) times daily. 11/03/23  Yes [provider]  fluticasone (FLONASE) 50 MCG/ACT nasal spray Place into the nose. 12/01/23 11/30/24 Yes [provider]  meloxicam (MOBIC) 7.5 MG tablet Take 1 tablet by mouth daily. 11/29/23  Yes [provider]  nitrofurantoin, macrocrystal-monohydrate, (MACROBID) 100 MG capsule Take 100 mg by mouth 2 (two) times daily. 09/15/23  Yes [provider]  ondansetron (ZOFRAN-ODT) 4 MG disintegrating tablet Take 4 mg by mouth every 8 (eight) hours as needed. 10/08/23  Yes [provider]  predniSONE (DELTASONE) 20 MG tablet Take by mouth. 11/03/23  Yes [provider]  senna-docusate (SENOKOT-S) 8.6-50 MG tablet Take 1 tablet by mouth daily. 01/14/20  Yes [provider]  topiramate (TOPAMAX) 25 MG tablet  08/01/16  Yes [provider]  albuterol (VENTOLIN HFA) 108 (90 Base) MCG/ACT inhaler Inhale 2 puffs into the lungs every 6 (six) hours as needed for wheezing or shortness of breath. 11/14/23 12/14/23  Meliton Rattan, PA  Alpha-Lipoic Acid 600 MG CAPS Take 600 mg by mouth 2 (two) times daily.    [provider]  BELBUCA 300 MCG FILM Take by mouth.    [provider]  docusate sodium (COLACE) 50 MG capsule Take 50 mg by mouth daily as needed.    [provider]  gabapentin (NEURONTIN) 800 MG tablet Take 1 tablet by mouth 4 (four) times daily. 09/18/16   [provider]  lisinopril (PRINIVIL,ZESTRIL) 40 MG tablet Take 40 mg by mouth at bedtime.    [provider]  meloxicam (MOBIC) 7.5 MG tablet Take 7.5 mg by mouth daily. 08/24/23 08/23/24  [provider]  methocarbamol (ROBAXIN) 500 MG tablet Take 1 tablet (500 mg  total) by mouth every 6 (six) hours as needed for muscle spasms. 08/19/23   Drake Leach, PA-C  nystatin-triamcinolone (MYCOLOG II) cream Apply 1 Application topically as needed (under breast). 03/18/23 03/17/24  [provider]  pantoprazole (PROTONIX) 40 MG tablet Take 40 mg by mouth every morning. 11/17/21   [provider]  traZODone (DESYREL) 100 MG tablet Take 100 mg by mouth at bedtime.    [provider]  trimethoprim (TRIMPEX) 100 MG tablet Take 1 tablet (100 mg total) by mouth daily. 06/14/23   Alfredo Martinez, MD  venlafaxine XR (EFFEXOR-XR) 150 MG 24 hr capsule Take 150 mg by mouth at bedtime. Take with 75 mg to equal 225 mg daily 10/26/19   [provider]  venlafaxine XR (EFFEXOR-XR) 75 MG 24 hr capsule Take 75 mg by mouth at bedtime. Take with 150 mg to equal 225 mg daily 10/16/21   [provider]  WIXELA INHUB 100-50 MCG/ACT AEPB SMARTSIG:1 Puff(s) By Mouth Every 12 Hours    [provider]  XYLITOL MT Use as directed 2 tablets in the mouth or throat daily as needed (dry mouth).    [provider]    Family History Family History  Problem Relation Age of Onset   Hypertension Mother    Depression Mother    Diabetes Mother    Varicose Veins Mother    Hyperlipidemia Sister    Hypertension Sister    Depression Sister    Varicose Veins Sister    Depression Brother    Arthritis Maternal Grandmother    Diabetes Maternal Grandmother    Sleep apnea Neg Hx     Social History Social History   Tobacco Use   Smoking status: Former    Current packs/day: 0.00    Average packs/day: 1.5 packs/day for 20.0 years (30.0 ttl pk-yrs)    Types: Cigarettes    Start date: 10/29/1984    Quit date: 10/29/2004    Years since quitting: 19.1    Passive exposure: Past   Smokeless tobacco: Never  Vaping Use   Vaping status: Never Used  Substance Use Topics   Alcohol use: Not Currently    Comment: Rare social drinking   Drug use:  Never     Allergies   Contrast media [iodinated contrast media], Duloxetine, Pregabalin, Iodine, Clarithromycin, and Spironolactone-hctz   Review of Systems Review of Systems Per HPI  Physical Exam Triage Vital Signs ED Triage Vitals  Encounter Vitals Group     BP 12/10/23 1751 (!) 167/115     Systolic BP Percentile --      Diastolic BP Percentile --      Pulse Rate 12/10/23 1751 96     Resp 12/10/23 1751 18     Temp 12/10/23 1751 97.9 F (36.6 C)     Temp Source 12/10/23 1751 Oral     SpO2 12/10/23 1751 93 %     Weight --      Height --      Head Circumference --      Peak Flow --      Pain Score 12/10/23 1750 0     Pain Loc --      Pain Education --      Exclude from Growth Chart --    No data found.  Updated Vital Signs BP (!) 167/94 Comment: provider rechecked vitals, adding into chart for provider  Pulse 96   Temp 97.9 F (36.6 C) (Oral)   Resp 18   SpO2 95%   Visual Acuity Right Eye Distance:   Left Eye Distance:   Bilateral Distance:    Right Eye Near:   Left Eye Near:    Bilateral Near:     Physical Exam Constitutional:      General: She is not in acute distress.    Appearance: Normal appearance. She is not toxic-appearing or diaphoretic.  HENT:     Head: Normocephalic and atraumatic.  Eyes:     Extraocular Movements: Extraocular movements intact.     Conjunctiva/sclera: Conjunctivae normal.  Cardiovascular:     Rate and Rhythm: Normal rate and regular rhythm.     Pulses: Normal pulses.     Heart sounds: Normal heart sounds.  Pulmonary:     Effort: Pulmonary effort is normal. No respiratory distress.     Breath sounds: Normal breath sounds. No stridor. No wheezing, rhonchi or rales.  Neurological:     General:  No focal deficit present.     Mental Status: She is alert and oriented to person, place, and time. Mental status is at baseline.  Psychiatric:        Mood and Affect: Mood normal.        Behavior: Behavior normal.        Thought  Content: Thought content normal.        Judgment: Judgment normal.      UC Treatments / Results  Labs (all labs ordered are listed, but only abnormal results are displayed) Labs Reviewed - No data to display  EKG   Radiology No results found.  Procedures Procedures (including critical care time)  Medications Ordered in UC Medications - No data to display  Initial Impression / Assessment and Plan / UC Course  I have reviewed the triage vital signs and the nursing notes.  Pertinent labs & imaging results that were available during my care of the patient were reviewed by me and considered in my medical decision making (see chart for details).     Patient presents with persistent, dry cough for multiple months.  She had most recent chest x-ray on 12/01/2023 that was negative for any acute cardiopulmonary process.  Given no adventitious lung sounds on exam, oxygen being normal, no tachypnea I do not think additional chest imaging is necessary at this time.  Patient has been on several different treatment regimens which have not been helpful.  Patient reports that she has not tolerated antibiotics.  Patient states that she has previously tolerated amoxicillin well so will trial this as opposed to augmentin to see if this will be helpful to cover for any secondary bacterial infection.  Although, I do think the patient needs to see pulmonologist given persistent cough and treatment being refractory to medications.  Therefore, ambulatory referral to pulmonologist was placed today.  Also advised patient to follow-up with PCP.  Blood pressure also slightly elevated but patient reports that she has not yet taken her blood pressure medication today.  Encouraged patient to monitor blood pressure at home and follow-up with PCP or urgent care if it remains elevated.  Patient verbalized understanding and was agreeable with plan. Final Clinical Impressions(s) / UC Diagnoses   Final diagnoses:   Persistent cough for 3 weeks or longer     Discharge Instructions      I have prescribed you an antibiotic and placed a referral to pulmonology.  Please follow-up with primary care doctor as well.    ED Prescriptions     Medication Sig Dispense Auth. Provider   amoxicillin (AMOXIL) 875 MG tablet Take 1 tablet (875 mg total) by mouth 2 (two) times daily for 7 days. 14 tablet Headrick, Acie Fredrickson, [place]      PDMP not reviewed this encounter.   Gustavus Bryant, [place] 12/10/23 1838    Gustavus Bryant, [place] 12/10/23 [id number]

## 2023-12-10 NOTE — Discharge Instructions (Signed)
 I have prescribed you an antibiotic and placed a referral to pulmonology.  Please follow-up with primary care doctor as well.

## 2023-12-10 NOTE — ED Triage Notes (Signed)
 Pt states, "I have been sick since Christmas. I have been fighting this cough since then. I was just diagnosed with RSV. Was given antibiotics but they made her sick to the stomach. Pt states she did not finish the previous RX's prescribed (antibiotic and steroid) because they made her sick. Pt states she stopped taking the Tessalon pearls because she has been taking them more than 7 days.

## 2023-12-13 ENCOUNTER — Ambulatory Visit: Admitting: Urology

## 2023-12-27 ENCOUNTER — Ambulatory Visit: Admitting: Urology

## 2023-12-27 ENCOUNTER — Encounter: Payer: Self-pay | Admitting: Urology

## 2023-12-27 VITALS — BP 169/87 | HR 98 | Ht 64.0 in | Wt 199.0 lb

## 2023-12-27 DIAGNOSIS — N3946 Mixed incontinence: Secondary | ICD-10-CM | POA: Diagnosis not present

## 2023-12-27 LAB — URINALYSIS, COMPLETE
Bilirubin, UA: NEGATIVE
Glucose, UA: NEGATIVE
Ketones, UA: NEGATIVE
Nitrite, UA: NEGATIVE
RBC, UA: NEGATIVE
Specific Gravity, UA: >1.03 — ABNORMAL HIGH (ref 1.005–1.030)
Urobilinogen, Ur: 0.2 mg/dL (ref 0.2–1.0)
pH, UA: 5.5 (ref 5.0–7.5)

## 2023-12-27 LAB — MICROSCOPIC EXAMINATION

## 2023-12-27 MED ORDER — TRIMETHOPRIM 100 MG PO TABS: 100.0000 mg | ORAL_TABLET | Freq: Every day | ORAL | 3 refills | Status: DC

## 2023-12-27 NOTE — Progress Notes (Signed)
 12/27/2023 10:25 AM   Lisa Good 02-Jun-1951 [id number]  Referring provider: Patrice Paradise, MD [address] [facility] [address] [facility] [place]  [place] [address]  No chief complaint on file.   HPI: Reviewed long note.  Patient gets a lot of urinary tract infections.  In April 2024 she had Botox I felt 80% of bladder dysfunction was overactive bladder with urge incontinence and mild stress incontinence.  Did not want to do percutaneous tibial nerve stimulation due to travel time.  Had 2 Botox.  She has had tube synthetic slings 1 pubovaginal sling in [place].  She saw a nurse practitioner in June 2024.  After the first Botox she was 40% better but we decided to repeat it recognizing she may consider InterStim in the future.  She does have a back stimulator tried multiple medication including Myrbetriq Gemtesa and some antimuscarinics.  Leslye Peer was too expensive.  She went into retention after the first Botox seen weeks afterwards with a residual of 541 mL.  Her flow symptoms worsened after her surgical procedure the day prior.  She had a positive urine culture April 2024 and 2 negative cultures in May and June   When she saw the nurse practitioner recently she really did not think Botox helped a lot.  She was no longer on suppressive trimethoprim.  He had a Foley catheter for a week and came in for a trial of voiding with a residual 177 mL.   Is clinically not infected today but we both agreed to go back on trimethoprim.  She still wearing 3 or 4 pads a day moderately wet for urge incontinence and Botox did not work very well.  When she temporarily went into retention as noted above she had had a spinal cord stimulator for pain the day before and she says she really does not want it.  Her flow was but stable    Call if culture positive. Go back on trimethoprim 100 mg 90 x 3. She will like to do percutaneous tibial nerve stimulation. Handout given. Hopefully this greatly improves  her incontinence. She does not want InterStim. She realizes that were getting to the end of the treatment pathway   Today Urine culture several months ago was positive. Moderate improvement on PTNS and wants to continue.  Infection free on trimethoprim and 90 x 3 sent to pharmacy.  She does not like her spinal cord stimulator and does not want InterStim.   PMH: Past Medical History:  Diagnosis Date   Allergy    seasonal   Arthritis    Cataract    Cerebral aneurysm    Chronic kidney disease    Chronic pain syndrome    Complication of anesthesia    trouble waking up after back surgery   DDD (degenerative disc disease), lumbar    Depression    DVT (deep venous thrombosis) (HCC) 2002   Fibromyalgia    GERD (gastroesophageal reflux disease)    Heart murmur    Heart dr said normal for me   History of kidney stones    Hyperlipidemia    Hypertension    Hypothyroidism    Incontinence in female    Iron deficiency anemia 11/01/2019   Irregular heart beat    Neuromuscular disorder (HCC)    peripheral neuropathy/feet   Obesity    OSA on CPAP    Pancolitis (HCC)    Pre-diabetes    RLS (restless legs syndrome)    UTI (urinary tract infection) 11/2022  Surgical History: Past Surgical History:  Procedure Laterality Date   BIOPSY  06/23/2023   Procedure: BIOPSY;  Surgeon: Norma Fredrickson, Boykin Nearing, MD;  Location: [facility] ENDOSCOPY;  Service: Gastroenterology;;   BRAIN SURGERY  2002   aneurysm   CEREBRAL ANEURYSM REPAIR  2002   COLONOSCOPY WITH PROPOFOL N/A 11/10/2019   Procedure: COLONOSCOPY WITH PROPOFOL;  Surgeon: Wyline Mood, MD;  Location: [facility] SUR[facility]ERY [facility];  Service: Endoscopy;  Laterality: N/A;   COLONOSCOPY WITH PROPOFOL N/A 06/23/2023   Procedure: COLONOSCOPY WITH PROPOFOL;  Surgeon: Toledo, Boykin Nearing, MD;  Location: [facility] ENDOSCOPY;  Service: Gastroenterology;  Laterality: N/A;   COSMETIC SURGERY     ESOPHAGOGASTRODUODENOSCOPY (EGD) WITH PROPOFOL N/A 11/10/2019   Procedure:  ESOPHAGOGASTRODUODENOSCOPY (EGD) WITH PROPOFOL;  Surgeon: Wyline Mood, MD;  Location: [facility] SURGERY CNTR;  Service: Endoscopy;  Laterality: N/A;   ESOPHAGOGASTRODUODENOSCOPY (EGD) WITH PROPOFOL N/A 06/23/2023   Procedure: ESOPHAGOGASTRODUODENOSCOPY (EGD) WITH PROPOFOL;  Surgeon: Toledo, Boykin Nearing, MD;  Location: [facility] ENDOSCOPY;  Service: Gastroenterology;  Laterality: N/A;   EXTRACORPOREAL SHOCK WAVE LITHOTRIPSY Right 04/27/2019   Procedure: EXTRACORPOREAL SHOCK WAVE LITHOTRIPSY (ESWL);  Surgeon: Sondra Come, MD;  Location: [facility] ORS;  Service: Urology;  Laterality: Right;   EYE SURGERY     JOINT REPLACEMENT Bilateral    knee   KNEE ARTHROPLASTY Left 01/12/2020   Procedure: COMPUTER ASSISTED TOTAL KNEE ARTHROPLASTY;  Surgeon: Donato Heinz, MD;  Location: [facility] ORS;  Service: Orthopedics;  Laterality: Left;   MALONEY DILATION  06/23/2023   Procedure: MALONEY DILATION;  Surgeon: Norma Fredrickson, Boykin Nearing, MD;  Location: [facility] ENDOSCOPY;  Service: Gastroenterology;;   POLYPECTOMY  11/10/2019   Procedure: POLYPECTOMY;  Surgeon: Wyline Mood, MD;  Location: [facility] SURGERY [facility]TR;  Service: Endoscopy;;   REVERSE SHOULDER ARTHROPLASTY Right 01/06/2022   Procedure: REVERSE SHOULDER ARTHROPLASTY;  Surgeon: Christena Flake, MD;  Location: [facility] ORS;  Service: Orthopedics;  Laterality: Right;   SPINAL CORD STIMULATOR BATTERY EXCHANGE N/A 02/01/2023   Procedure: INTERNAL PULSE GENERATOR REPLACEMENT ([employer]);  Surgeon: Venetia Night, MD;  Location: [facility] ORS;  Service: Neurosurgery;  Laterality: N/A;  LOCAL WITH MAC   SPINAL FUSION  07/10/2019   C4-5 corpectomy with C6-7 ACDF, C2-T3 spinal fusion   SPINE SURGERY     spinal fusion   THORACIC LAMINECTOMY FOR SPINAL CORD STIMULATOR N/A 12/25/2022   Procedure: THORACIC LAMINECTOMY FOR SPINAL CORD STIMULATOR IMPLANTATION ([employer]);  Surgeon: Venetia Night, MD;  Location: [facility] ORS;  Service: Neurosurgery;  Laterality: N/A;   TUBAL LIGATION       Home Medications:  Allergies as of 12/27/2023       Reactions   Contrast Media [iodinated Contrast Media] Hives   Duloxetine Swelling   Legs and feet   Pregabalin Swelling   Legs and feet   Iodine Hives   Clarithromycin Nausea Only   Spironolactone-hctz Rash   Only with iv injection        Medication List        Accurate as of December 27, 2023 10:25 AM. If you have any questions, ask your nurse or doctor.          albuterol 108 (90 Base) MCG/ACT inhaler Commonly known as: VENTOLIN HFA Inhale 2 puffs into the lungs every 6 (six) hours as needed for wheezing or shortness of breath.   Alpha-Lipoic Acid 600 MG Caps Take 600 mg by mouth 2 (two) times daily.   Belbuca 300 MCG Film Generic drug: Buprenorphine HCl Take by mouth.   benzonatate 200  MG capsule Commonly known as: TESSALON Take by mouth.   buprenorphine 10 MCG/HR Ptwk Commonly known as: BUTRANS Place onto the skin.   docusate sodium 50 MG capsule Commonly known as: COLACE Take 50 mg by mouth daily as needed.   doxycycline 100 MG capsule Commonly known as: VIBRAMYCIN Take 100 mg by mouth 2 (two) times daily.   fluticasone 50 MCG/ACT nasal spray Commonly known as: FLONASE Place into the nose.   gabapentin 800 MG tablet Commonly known as: NEURONTIN Take 1 tablet by mouth 4 (four) times daily.   lisinopril 40 MG tablet Commonly known as: ZESTRIL Take 40 mg by mouth at bedtime.   meloxicam 7.5 MG tablet Commonly known as: MOBIC Take 7.5 mg by mouth daily.   meloxicam 7.5 MG tablet Commonly known as: MOBIC Take 1 tablet by mouth daily.   methocarbamol 500 MG tablet Commonly known as: ROBAXIN Take 1 tablet (500 mg total) by mouth every 6 (six) hours as needed for muscle spasms.   nitrofurantoin (macrocrystal-monohydrate) 100 MG capsule Commonly known as: MACROBID Take 100 mg by mouth 2 (two) times daily.   nystatin-triamcinolone cream Commonly known as: MYCOLOG II Apply 1 Application  topically as needed (under breast).   ondansetron 4 MG disintegrating tablet Commonly known as: ZOFRAN-ODT Take 4 mg by mouth every 8 (eight) hours as needed.   pantoprazole 40 MG tablet Commonly known as: PROTONIX Take 40 mg by mouth every morning.   predniSONE 20 MG tablet Commonly known as: DELTASONE Take by mouth.   senna-docusate 8.6-50 MG tablet Commonly known as: Senokot-S Take 1 tablet by mouth daily.   topiramate 25 MG tablet Commonly known as: TOPAMAX   traZODone 100 MG tablet Commonly known as: DESYREL Take 100 mg by mouth at bedtime.   trimethoprim 100 MG tablet Commonly known as: TRIMPEX Take 1 tablet (100 mg total) by mouth daily.   venlafaxine XR 150 MG 24 hr capsule Commonly known as: EFFEXOR-XR Take 150 mg by mouth at bedtime. Take with 75 mg to equal 225 mg daily   venlafaxine XR 75 MG 24 hr capsule Commonly known as: EFFEXOR-XR Take 75 mg by mouth at bedtime. Take with 150 mg to equal 225 mg daily   Wixela Inhub 100-50 MCG/ACT Aepb Generic drug: fluticasone-salmeterol SMARTSIG:1 Puff(s) By Mouth Every 12 Hours   XYLITOL MT Use as directed 2 tablets in the mouth or throat daily as needed (dry mouth).        Allergies:  Allergies  Allergen Reactions   Contrast Media [Iodinated Contrast Media] Hives   Duloxetine Swelling    Legs and feet   Pregabalin Swelling    Legs and feet   Iodine Hives   Clarithromycin Nausea Only   Spironolactone-Hctz Rash    Only with iv injection    Family History: Family History  Problem Relation Age of Onset   Hypertension Mother    Depression Mother    Diabetes Mother    Varicose Veins Mother    Hyperlipidemia Sister    Hypertension Sister    Depression Sister    Varicose Veins Sister    Depression Brother    Arthritis Maternal Grandmother    Diabetes Maternal Grandmother    Sleep apnea Neg Hx     Social History:  reports that she quit smoking about 19 years ago. Her smoking use included  cigarettes. She started smoking about 39 years ago. She has a 30 pack-year smoking history. She has been exposed to tobacco smoke. She  has never used smokeless tobacco. She reports that she does not currently use alcohol. She reports that she does not use drugs.  ROS:                                        Physical Exam: There were no vitals taken for this visit.  Constitutional:  Alert and oriented, No acute distress. HEENT: [place] AT, moist mucus membranes.  Trachea midline, no masses.   Laboratory Data: Lab Results  Component Value Date   WBC 8.2 11/10/2023   HGB 13.1 11/10/2023   HCT 42.0 11/10/2023   MCV 91.1 11/10/2023   PLT 310 11/10/2023    Lab Results  Component Value Date   CREATININE 1.58 (H) 11/10/2023    No results found for: "PSA"  No results found for: "TESTOSTERONE"  No results found for: "HGBA1C"  Urinalysis    Component Value Date/Time   COLORURINE YELLOW 10/02/2023 0801   APPEARANCEUR CLEAR 10/02/2023 0801   APPEARANCEUR Clear 06/14/2023 1312   LABSPEC 1.010 10/02/2023 0801   PHURINE 5.5 10/02/2023 0801   GLUCOSEU NEGATIVE 10/02/2023 0801   HGBUR NEGATIVE 10/02/2023 0801   BILIRUBINUR NEGATIVE 10/02/2023 0801   BILIRUBINUR Negative 06/14/2023 1312   KETONESUR NEGATIVE 10/02/2023 0801   PROTEINUR NEGATIVE 10/02/2023 0801   UROBILINOGEN 0.2 07/26/2022 1550   NITRITE NEGATIVE 10/02/2023 0801   LEUKOCYTESUR NEGATIVE 10/02/2023 0801    Pertinent Imaging:   Assessment & Plan: Continue with PTNS monthly.  We have reached the end of the treatment pathway.  This again was discussed.  Renew trimethoprim.  If she leaves the urine sent for culture.  See in 1 year  1. Mixed incontinence (Primary)  - Urinalysis, Complete   No follow-ups on file.  Martina Sinner, MD  [facility] [address], [address] [place], [place] [address] [phone]

## 2023-12-28 ENCOUNTER — Encounter: Payer: Self-pay | Admitting: Hematology & Oncology

## 2023-12-29 ENCOUNTER — Encounter: Payer: Self-pay | Admitting: Urology

## 2023-12-30 ENCOUNTER — Other Ambulatory Visit: Payer: Self-pay

## 2023-12-30 LAB — CULTURE, URINE COMPREHENSIVE

## 2023-12-30 MED ORDER — NITROFURANTOIN MACROCRYSTAL 100 MG PO CAPS: 100.0000 mg | ORAL_CAPSULE | Freq: Two times a day (BID) | ORAL | 0 refills | Status: DC

## 2024-01-03 ENCOUNTER — Telehealth: Payer: Self-pay

## 2024-01-03 ENCOUNTER — Other Ambulatory Visit: Payer: Self-pay

## 2024-01-03 MED ORDER — CIPROFLOXACIN HCL 250 MG PO TABS
250.0000 mg | ORAL_TABLET | Freq: Two times a day (BID) | ORAL | 0 refills | Status: AC
Start: 2024-01-03 — End: 2024-01-08

## 2024-01-03 NOTE — Telephone Encounter (Signed)
 Pt states she can't take Macrodantin, states "really bad side effects. It caused bad flare up of my Neuropathy". Antibiotic sent on 12/30/2023, please advise.

## 2024-01-17 ENCOUNTER — Ambulatory Visit: Admitting: Physician Assistant

## 2024-01-17 ENCOUNTER — Ambulatory Visit: Payer: Medicare Other | Admitting: Urology

## 2024-01-17 DIAGNOSIS — N3946 Mixed incontinence: Secondary | ICD-10-CM

## 2024-01-17 LAB — URINALYSIS, COMPLETE
Bilirubin, UA: NEGATIVE
Glucose, UA: NEGATIVE
Ketones, UA: NEGATIVE
Nitrite, UA: NEGATIVE
Specific Gravity, UA: 1.015 (ref 1.005–1.030)
Urobilinogen, Ur: 0.2 mg/dL (ref 0.2–1.0)
pH, UA: 7 (ref 5.0–7.5)

## 2024-01-17 LAB — MICROSCOPIC EXAMINATION: WBC, UA: >30 /HPF — AB (ref 0–5)

## 2024-01-17 NOTE — Progress Notes (Signed)
 PTNS  Session # 13  Health & Social Factors: Increased frequency and nocturia this week without dysuria. Caffeine: 0 Alcohol: 0 Daytime voids #per day: 10 Night-time voids #per night: 6 Urgency: strong Incontinence Episodes #per day: 2 Ankle used: left Treatment Setting: 12 Feeling/ Response: both Comments: Patient tolerated well. I asked her to leave a UA on the way out to evaluate for possible UTI.  Performed By: Bird Swetz, PA-C   Follow Up: Return in about 4 weeks (around 02/14/2024) for PTNS maintenance, Will call with results.

## 2024-01-17 NOTE — Patient Instructions (Signed)

## 2024-01-20 ENCOUNTER — Encounter (HOSPITAL_BASED_OUTPATIENT_CLINIC_OR_DEPARTMENT_OTHER): Payer: Self-pay | Admitting: Emergency Medicine

## 2024-01-20 ENCOUNTER — Emergency Department (HOSPITAL_BASED_OUTPATIENT_CLINIC_OR_DEPARTMENT_OTHER)

## 2024-01-20 ENCOUNTER — Other Ambulatory Visit: Payer: Self-pay

## 2024-01-20 ENCOUNTER — Emergency Department (HOSPITAL_BASED_OUTPATIENT_CLINIC_OR_DEPARTMENT_OTHER)
Admission: EM | Admit: 2024-01-20 | Discharge: 2024-01-20 | Disposition: A | Discharge status: Home / Self Care | Attending: Emergency Medicine | Admitting: Emergency Medicine

## 2024-01-20 DIAGNOSIS — R110 Nausea: Secondary | ICD-10-CM | POA: Insufficient documentation

## 2024-01-20 DIAGNOSIS — M791 Myalgia, unspecified site: Secondary | ICD-10-CM | POA: Insufficient documentation

## 2024-01-20 DIAGNOSIS — R109 Unspecified abdominal pain: Secondary | ICD-10-CM | POA: Diagnosis present

## 2024-01-20 DIAGNOSIS — I129 Hypertensive chronic kidney disease with stage 1 through stage 4 chronic kidney disease, or unspecified chronic kidney disease: Secondary | ICD-10-CM | POA: Insufficient documentation

## 2024-01-20 DIAGNOSIS — K5900 Constipation, unspecified: Secondary | ICD-10-CM | POA: Diagnosis not present

## 2024-01-20 DIAGNOSIS — E039 Hypothyroidism, unspecified: Secondary | ICD-10-CM | POA: Diagnosis not present

## 2024-01-20 DIAGNOSIS — N189 Chronic kidney disease, unspecified: Secondary | ICD-10-CM | POA: Diagnosis not present

## 2024-01-20 DIAGNOSIS — R1032 Left lower quadrant pain: Secondary | ICD-10-CM | POA: Diagnosis not present

## 2024-01-20 DIAGNOSIS — R197 Diarrhea, unspecified: Secondary | ICD-10-CM | POA: Diagnosis not present

## 2024-01-20 LAB — COMPREHENSIVE METABOLIC PANEL WITH GFR
ALT: 17 U/L (ref 0–44)
AST: 28 U/L (ref 15–41)
Albumin: 4.1 g/dL (ref 3.5–5.0)
Alkaline Phosphatase: 100 U/L (ref 38–126)
Anion gap: 12 (ref 5–15)
BUN: 21 mg/dL (ref 8–23)
CO2: 27 mmol/L (ref 22–32)
Calcium: 9.7 mg/dL (ref 8.9–10.3)
Chloride: 101 mmol/L (ref 98–111)
Creatinine, Ser: 1.05 mg/dL — ABNORMAL HIGH (ref 0.44–1.00)
GFR, Estimated: 56 mL/min — ABNORMAL LOW (ref >60)
Glucose, Bld: 110 mg/dL — ABNORMAL HIGH (ref 70–99)
Potassium: 3.6 mmol/L (ref 3.5–5.1)
Sodium: 140 mmol/L (ref 135–145)
Total Bilirubin: 0.7 mg/dL (ref 0.0–1.2)
Total Protein: 7.5 g/dL (ref 6.5–8.1)

## 2024-01-20 LAB — URINALYSIS, ROUTINE W REFLEX MICROSCOPIC
Glucose, UA: NEGATIVE mg/dL
Ketones, ur: NEGATIVE mg/dL
Nitrite: NEGATIVE
Protein, ur: 30 mg/dL — AB
Specific Gravity, Urine: >=1.03 (ref 1.005–1.030)
pH: 5.5 (ref 5.0–8.0)

## 2024-01-20 LAB — CBC
HCT: 42.5 % (ref 36.0–46.0)
Hemoglobin: 14.1 g/dL (ref 12.0–15.0)
MCH: 28.9 pg (ref 26.0–34.0)
MCHC: 33.2 g/dL (ref 30.0–36.0)
MCV: 87.1 fL (ref 80.0–100.0)
Platelets: 265 10*3/uL (ref 150–400)
RBC: 4.88 MIL/uL (ref 3.87–5.11)
RDW: 14.7 % (ref 11.5–15.5)
WBC: 9.6 10*3/uL (ref 4.0–10.5)
nRBC: 0 % (ref 0.0–0.2)

## 2024-01-20 LAB — OCCULT BLOOD X 1 CARD TO LAB, STOOL: Fecal Occult Bld: POSITIVE — AB

## 2024-01-20 LAB — URINALYSIS, MICROSCOPIC (REFLEX)

## 2024-01-20 LAB — CULTURE, URINE COMPREHENSIVE

## 2024-01-20 LAB — LIPASE, BLOOD: Lipase: 37 U/L (ref 11–51)

## 2024-01-20 MED ORDER — HYDROMORPHONE HCL 1 MG/ML IJ SOLN
1.0000 mg | Freq: Once | INTRAMUSCULAR | Status: AC
  Administered 2024-01-20: 1 mg via INTRAVENOUS
  Filled 2024-01-20: qty 1

## 2024-01-20 MED ORDER — SENNOSIDES-DOCUSATE SODIUM 8.6-50 MG PO TABS: 1.0000 | ORAL_TABLET | Freq: Every day | ORAL | 0 refills | Status: DC

## 2024-01-20 MED ORDER — MELOXICAM 15 MG PO TABS: 15.0000 mg | ORAL_TABLET | Freq: Every day | ORAL | 0 refills | Status: DC

## 2024-01-20 MED ORDER — SULFAMETHOXAZOLE-TRIMETHOPRIM 800-160 MG PO TABS: 1.0000 | ORAL_TABLET | Freq: Two times a day (BID) | ORAL | 0 refills | Status: AC

## 2024-01-20 MED ORDER — KETOROLAC TROMETHAMINE 15 MG/ML IJ SOLN
15.0000 mg | Freq: Once | INTRAMUSCULAR | Status: AC
  Administered 2024-01-20: 15 mg via INTRAVENOUS
  Filled 2024-01-20: qty 1

## 2024-01-20 MED ORDER — METOCLOPRAMIDE HCL 5 MG/ML IJ SOLN
10.0000 mg | Freq: Once | INTRAMUSCULAR | Status: AC
  Administered 2024-01-20: 10 mg via INTRAVENOUS
  Filled 2024-01-20: qty 2

## 2024-01-20 MED ORDER — SODIUM CHLORIDE 0.9 % IV BOLUS
500.0000 mL | Freq: Once | INTRAVENOUS | Status: AC
  Administered 2024-01-20: 500 mL via INTRAVENOUS

## 2024-01-20 NOTE — Discharge Instructions (Addendum)
 The CAT scan today did not show any new process such as infection or masses.  We would like you to change what you are taking for constipation to help have regular bowel movements.  Also increase your Mobic to 2 tablets so a total of 15 mg and a new prescription for 15 mg tablets was sent to your pharmacy when you run out of the 7.5 and start taking your Belbuca and other medicines more regularly.  Follow-up with a GI doctor if your symptoms do not improve as well as your regular doctor if you continue to have pain.

## 2024-01-20 NOTE — ED Triage Notes (Signed)
 Diarrhea and bloody stools x 2 days , gl abd pain . Had this issues before with no known diagnosis . Nausea with no emesis .

## 2024-01-20 NOTE — ED Provider Notes (Signed)
 Emergency Department Provider Note   I have reviewed the triage vital signs and the nursing notes.   HISTORY  Chief Complaint Abdominal Pain   HPI Lisa Good is a 73 y.o. female with past history reviewed below presents to the emergency department for evaluation of abdominal discomfort, nausea, diarrhea.  She has seen some bright red blood streaks with stool in the past 2 days.  She has been attempting to treat her constipation with docusate over-the-counter without significant relief. No fever. No CP. She is not anticoagulated.    Past Medical History:  Diagnosis Date   Allergy    seasonal   Arthritis    Cataract    Cerebral aneurysm    Chronic kidney disease    Chronic pain syndrome    Complication of anesthesia    trouble waking up after back surgery   DDD (degenerative disc disease), lumbar    Depression    DVT (deep venous thrombosis) (HCC) 2002   Fibromyalgia    GERD (gastroesophageal reflux disease)    Heart murmur    Heart dr said normal for me   History of kidney stones    Hyperlipidemia    Hypertension    Hypothyroidism    Incontinence in female    Iron  deficiency anemia 11/01/2019   Irregular heart beat    Neuromuscular disorder (HCC)    peripheral neuropathy/feet   Obesity    OSA on CPAP    Pancolitis (HCC)    Pre-diabetes    RLS (restless legs syndrome)    UTI (urinary tract infection) 11/2022    Review of Systems  Constitutional: No fever/chills Cardiovascular: Denies chest pain. Respiratory: Denies shortness of breath. Gastrointestinal: Positive abdominal pain. Mild nausea, no vomiting. Positive diarrhea with intermittent constipation. Skin: Negative for rash. Neurological: Negative for headaches.  ____________________________________________   PHYSICAL EXAM:  VITAL SIGNS: ED Triage Vitals  Encounter Vitals Group     BP 01/20/24 1255 (!) 157/103     Pulse Rate 01/20/24 1255 (!) 110     Resp 01/20/24 1255 18     Temp 01/20/24  1255 98.1 F (36.7 C)     Temp Source 01/20/24 1255 Oral     SpO2 01/20/24 1255 96 %   Constitutional: Alert and oriented. Well appearing and in no acute distress. Eyes: Conjunctivae are normal.  Head: Atraumatic. Nose: No congestion/rhinnorhea. Mouth/Throat: Mucous membranes are moist.  Neck: No stridor.   Cardiovascular: Normal rate, regular rhythm. Good peripheral circulation. Grossly normal heart sounds.   Respiratory: Normal respiratory effort.  No retractions. Lungs CTAB. Gastrointestinal: Soft and nontender. No distention.  Rectal exam performed after obtaining patient's verbal consent and with nurse tech chaperone at bedside.  Patient with small hemorrhoids externally without active bleeding.  Brown stool in the rectal vault without evidence of impaction.  No fissures. Musculoskeletal: No lower extremity tenderness nor edema. No gross deformities of extremities. Neurologic:  Normal speech and language.  Skin:  Skin is warm, dry and intact. No rash noted.   ____________________________________________   LABS (all labs ordered are listed, but only abnormal results are displayed)  Labs Reviewed  COMPREHENSIVE METABOLIC PANEL WITH GFR - Abnormal; Notable for the following components:      Result Value   Glucose, Bld 110 (*)    Creatinine, Ser 1.05 (*)    GFR, Estimated 56 (*)    All other components within normal limits  URINALYSIS, ROUTINE W REFLEX MICROSCOPIC - Abnormal; Notable for the following components:  APPearance CLOUDY (*)    Hgb urine dipstick MODERATE (*)    Bilirubin Urine SMALL (*)    Protein, ur 30 (*)    Leukocytes,Ua MODERATE (*)    All other components within normal limits  OCCULT BLOOD X 1 CARD TO LAB, STOOL - Abnormal; Notable for the following components:   Fecal Occult Bld POSITIVE (*)    All other components within normal limits  URINALYSIS, MICROSCOPIC (REFLEX) - Abnormal; Notable for the following components:   Bacteria, UA MANY (*)    All  other components within normal limits  LIPASE, BLOOD  CBC   ____________________________________________  RADIOLOGY  CT ABDOMEN PELVIS WO CONTRAST Result Date: 01/20/2024 CLINICAL DATA:  Acute abdominal pain EXAM: CT ABDOMEN AND PELVIS WITHOUT CONTRAST TECHNIQUE: Multidetector CT imaging of the abdomen and pelvis was performed following the standard protocol without IV contrast. RADIATION DOSE REDUCTION: This exam was performed according to the departmental dose-optimization program which includes automated exposure control, adjustment of the mA and/or kV according to patient size and/or use of iterative reconstruction technique. COMPARISON:  CT abdomen and pelvis 10/08/2023. FINDINGS: Lower chest: There are 2 left lower lobe pulmonary nodules measuring up to 4 mm. These are unchanged from prior. Hepatobiliary: Gallstones are present. There is no biliary ductal dilatation. The liver is within normal limits. Pancreas: Unremarkable. No pancreatic ductal dilatation or surrounding inflammatory changes. Spleen: Normal in size without focal abnormality. Adrenals/Urinary Tract: There is a 1 cm cyst in the superior pole of the right kidney. Otherwise, the kidneys, adrenal glands, and bladder are within normal limits. Stomach/Bowel: There is a small hiatal hernia. Stomach is within normal limits. Appendix appears normal. No evidence of bowel wall thickening, distention, or inflammatory changes. There is sigmoid colon diverticulosis. Vascular/Lymphatic: Aorta and IVC are normal in size. There are atherosclerotic calcifications of the aorta. No enlarged lymph nodes are seen. Reproductive: Uterus and bilateral adnexa are unremarkable. Other: There is no ascites. There is a fluid collection in the lower anterior abdominal wall superficial fascia measuring 4.2 x 2.0 x 7.2 cm which appears unchanged. There is a hernia with disruption of the internal fascia of the left rectus musculature only. This contains nondilated  small bowel and appears unchanged from prior. Musculoskeletal: Thoracic spinal cord stimulator device is present. Extensive thoracolumbar fusion hardware is again seen. No acute fractures are identified. Degenerative changes affect the spine. There is some stable scarring in the subcutaneous tissues of the lumbar region. Bilateral breast implants are partially imaged. IMPRESSION: 1. No acute localizing process in the abdomen or pelvis. 2. Stable fluid collection in the lower anterior abdominal wall superficial fascia. 3. Stable hernia with disruption of the internal fascia of the left rectus musculature only. This contains nondilated small bowel. 4. Cholelithiasis. 5. Small hiatal hernia. 6. Sigmoid colon diverticulosis. 7. Stable left lower lobe pulmonary nodules measuring up to 4 mm. No follow-up needed if patient is low-risk (and has no known or suspected primary neoplasm). Non-contrast chest CT can be considered in 12 months if patient is high-risk. This recommendation follows the consensus statement: Guidelines for Management of Incidental Pulmonary Nodules Detected on CT Images: From the Fleischner Society 2017; Radiology 2017; 284:228-243. Aortic Atherosclerosis (ICD10-I70.0). Electronically Signed   By: Tyron Gallon M.D.   On: 01/20/2024 16:09    ____________________________________________   PROCEDURES  Procedure(s) performed:   Procedures  None  ____________________________________________   INITIAL IMPRESSION / ASSESSMENT AND PLAN / ED COURSE  Pertinent labs & imaging results that were available  during my care of the patient were reviewed by me and considered in my medical decision making (see chart for details).   This patient is Presenting for Evaluation of abdominal pain, which does require a range of treatment options, and is a complaint that involves a high risk of morbidity and mortality.  The Differential Diagnoses includes but is not exclusive to acute cholecystitis,  intrathoracic causes for epigastric abdominal pain, gastritis, duodenitis, pancreatitis, small bowel or large bowel obstruction, abdominal aortic aneurysm, hernia, gastritis, etc.   Critical Interventions-    Medications  sodium chloride  0.9 % bolus 500 mL (0 mLs Intravenous Stopped 01/20/24 1428)  ketorolac  (TORADOL ) 15 MG/ML injection 15 mg (15 mg Intravenous Given 01/20/24 1645)  metoCLOPramide  (REGLAN ) injection 10 mg (10 mg Intravenous Given 01/20/24 1647)  HYDROmorphone  (DILAUDID ) injection 1 mg (1 mg Intravenous Given 01/20/24 1709)    Reassessment after intervention: pain improved.    I did obtain Additional Historical Information from husband at bedside.  Clinical Laboratory Tests Ordered, included CMP shows normal bilirubin and LFTs.  Lipase normal.  CBC without leukocytosis or anemia.  Radiologic Tests Ordered, included CT abdomen/pelvis. I independently interpreted the images and agree with radiology interpretation.   Cardiac Monitor Tracing which shows NSR.    Social Determinants of Health Risk patient is not an active smoker.   Medical Decision Making: Summary:  Patient presents emergency department for evaluation of abdominal pain with intermittent constipation and diarrhea.  No severe tenderness on exam.  No evidence of impaction.  Plan for CT without contrast due to contrast dye allergy, labs, IV fluids, reassess.  Reevaluation with update and discussion with patient. Care transferred to Dr. Leida Puna. Plan for CT and reassess.   Patient's presentation is most consistent with acute presentation with potential threat to life or bodily function.   Disposition: pending   ____________________________________________  FINAL CLINICAL IMPRESSION(S) / ED DIAGNOSES  Final diagnoses:  Left lower quadrant abdominal pain  Muscle pain  Constipation, unspecified constipation type     NEW OUTPATIENT MEDICATIONS STARTED DURING THIS VISIT:  Discharge Medication List as of  01/20/2024  5:31 PM     START taking these medications   Details  !! meloxicam  (MOBIC ) 15 MG tablet Take 1 tablet (15 mg total) by mouth daily., Starting Thu 01/20/2024, Normal     !! - Potential duplicate medications found. Please discuss with provider.      Note:  This document was prepared using Dragon voice recognition software and may include unintentional dictation errors.  Abby Hocking, MD, [facility]    Katiya Fike, Shereen Dike, MD 01/21/24 [phone]

## 2024-01-20 NOTE — ED Notes (Signed)
 Reviewed discharge instructions, follow up and medications with pt. Pt states understanding. Reports improvement in pain. Pt assisted to transport chair for discharge. Accompanied by husband

## 2024-01-20 NOTE — ED Provider Notes (Signed)
 Patient presenting with abdominal pain worse on the left with some blood in her stool and a mix of constipation and some occasional loose stool.  She does have hemorrhoids present on exam but no fecal impaction.  She is Hemoccult positive.  However the rest of her labs are within normal limits.  CT is pending.  I have independently visualized and interpreted pt's images today.  CT of abd/pelvis without signs of renal stones, hydronephrosis or obstruction.  Radiology reports no acute localizing process.  She does have a fluid-filled collection in the lower abdominal wall which is superficial and is stable hernia that contains nondilated small bowel but no evidence of incarceration as well as cholelithiasis without acute findings.  On repeat evaluation patient is having pain in the lower abdomen however on further exam she is also having pain in her back and leg and reports that she has recently been moving her mother's house and she has not been consistent with taking her regular pain regime for her chronic pain which includes buprenorphine.  No evidence of shingles.  Patient reports wanting something for pain here and she was given Toradol and Reglan.  She is requesting discharge home at this time.  Will increase her bowel regime and will have her increase her Mobic to 15 mg.    Almond Army, MD 01/20/24 [phone]

## 2024-01-20 NOTE — ED Notes (Signed)
 Pts spouse reports pt took laxative last night.

## 2024-02-08 ENCOUNTER — Inpatient Hospital Stay: Payer: Medicare Other | Admitting: Medical Oncology

## 2024-02-08 ENCOUNTER — Encounter: Payer: Self-pay | Admitting: Medical Oncology

## 2024-02-08 ENCOUNTER — Inpatient Hospital Stay: Payer: Self-pay | Attending: Medical Oncology

## 2024-02-08 VITALS — BP 153/69 | HR 90 | Temp 98.4°F | Resp 19 | Ht 63.0 in | Wt 204.4 lb

## 2024-02-08 DIAGNOSIS — Z8719 Personal history of other diseases of the digestive system: Secondary | ICD-10-CM

## 2024-02-08 DIAGNOSIS — E538 Deficiency of other specified B group vitamins: Secondary | ICD-10-CM | POA: Diagnosis not present

## 2024-02-08 DIAGNOSIS — D500 Iron deficiency anemia secondary to blood loss (chronic): Secondary | ICD-10-CM | POA: Insufficient documentation

## 2024-02-08 DIAGNOSIS — R7989 Other specified abnormal findings of blood chemistry: Secondary | ICD-10-CM | POA: Diagnosis not present

## 2024-02-08 DIAGNOSIS — K922 Gastrointestinal hemorrhage, unspecified: Secondary | ICD-10-CM | POA: Insufficient documentation

## 2024-02-08 LAB — CBC WITH DIFFERENTIAL (CANCER CENTER ONLY)
Abs Immature Granulocytes: 0.02 10*3/uL (ref 0.00–0.07)
Basophils Absolute: 0.1 10*3/uL (ref 0.0–0.1)
Basophils Relative: 1 %
Eosinophils Absolute: 0.2 10*3/uL (ref 0.0–0.5)
Eosinophils Relative: 4 %
HCT: 35.9 % — ABNORMAL LOW (ref 36.0–46.0)
Hemoglobin: 11.7 g/dL — ABNORMAL LOW (ref 12.0–15.0)
Immature Granulocytes: 0 %
Lymphocytes Relative: 18 %
Lymphs Abs: 1.1 10*3/uL (ref 0.7–4.0)
MCH: 29.1 pg (ref 26.0–34.0)
MCHC: 32.6 g/dL (ref 30.0–36.0)
MCV: 89.3 fL (ref 80.0–100.0)
Monocytes Absolute: 0.4 10*3/uL (ref 0.1–1.0)
Monocytes Relative: 7 %
Neutro Abs: 4.4 10*3/uL (ref 1.7–7.7)
Neutrophils Relative %: 70 %
Platelet Count: 225 10*3/uL (ref 150–400)
RBC: 4.02 MIL/uL (ref 3.87–5.11)
RDW: 15.1 % (ref 11.5–15.5)
WBC Count: 6.3 10*3/uL (ref 4.0–10.5)
nRBC: 0 % (ref 0.0–0.2)

## 2024-02-08 LAB — CMP (CANCER CENTER ONLY)
ALT: 8 U/L (ref 0–44)
AST: 16 U/L (ref 15–41)
Albumin: 4.2 g/dL (ref 3.5–5.0)
Alkaline Phosphatase: 88 U/L (ref 38–126)
Anion gap: 7 (ref 5–15)
BUN: 29 mg/dL — ABNORMAL HIGH (ref 8–23)
CO2: 28 mmol/L (ref 22–32)
Calcium: 9.6 mg/dL (ref 8.9–10.3)
Chloride: 107 mmol/L (ref 98–111)
Creatinine: 1.24 mg/dL — ABNORMAL HIGH (ref 0.44–1.00)
GFR, Estimated: 46 mL/min — ABNORMAL LOW (ref >60)
Glucose, Bld: 86 mg/dL (ref 70–99)
Potassium: 4.2 mmol/L (ref 3.5–5.1)
Sodium: 142 mmol/L (ref 135–145)
Total Bilirubin: 0.3 mg/dL (ref 0.0–1.2)
Total Protein: 6.6 g/dL (ref 6.5–8.1)

## 2024-02-08 LAB — RETIC PANEL
Immature Retic Fract: 12.9 % (ref 2.3–15.9)
RBC.: 4.03 MIL/uL (ref 3.87–5.11)
Retic Count, Absolute: 64.9 10*3/uL (ref 19.0–186.0)
Retic Ct Pct: 1.6 % (ref 0.4–3.1)
Reticulocyte Hemoglobin: 31.1 pg (ref >27.9)

## 2024-02-08 LAB — FERRITIN: Ferritin: 36 ng/mL (ref 11–307)

## 2024-02-08 LAB — VITAMIN B12: Vitamin B-12: 340 pg/mL (ref 180–914)

## 2024-02-08 NOTE — Progress Notes (Signed)
 Hematology and Oncology Follow Up Visit  Lisa Good [id number] [birth year] 73 y.o. 02/08/2024  Past Medical History:  Diagnosis Date   Allergy    seasonal   Arthritis    Cataract    Cerebral aneurysm    Chronic kidney disease    Chronic pain syndrome    Complication of anesthesia    trouble waking up after back surgery   DDD (degenerative disc disease), lumbar    Depression    DVT (deep venous thrombosis) (HCC) 2002   Fibromyalgia    GERD (gastroesophageal reflux disease)    Heart murmur    Heart dr said normal for me   History of kidney stones    Hyperlipidemia    Hypertension    Hypothyroidism    Incontinence in female    Iron  deficiency anemia 11/01/2019   Irregular heart beat    Neuromuscular disorder (HCC)    peripheral neuropathy/feet   Obesity    OSA on CPAP    Pancolitis (HCC)    Pre-diabetes    RLS (restless legs syndrome)    UTI (urinary tract infection) 11/2022    Principle Diagnosis:  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 CKD  Current Therapy:   Venofer  300 mg - last infusion 06/17/2023  Prior Work Up:  She had an endoscopy/colonoscopy on 06/13/2023. Two polyps were collected that were benign. No bleeding was found.     Interim History:  Ms. Spangle is back for follow-up for her IDA. She was first seen by our office on 05/13/2023. Labs from 03/29/2023 show an iron  of 19, iron  saturation of 4%, ferritin of 7 on 03/18/2023. Last CBC on 03/30/2023 showed a hemoglobin of 8.2, MCV of 80.6. She was started on Venofer  300 mg weekly x 3 weeks total. She is s/p Venofer  on 05/20/2023, 05/27/2023, 06/17/2023. She is here today for repeat labs and consideration of additional Venofer . She has tolerated the Venofer  well.    Today she states that she has been fair. About 1 month ago she had some GI distress following the death of her mother. She did see some blood loss around this time but it has resolved. The passing of her mother has been really hard on her. She is  considering grief counseling.   There has been no bleeding to her knowledge: denies epistaxis, gingivitis, hemoptysis, hematemesis, hematuria, melena, excessive bruising, blood donation.    Appetite is good    Wt Readings from Last 3 Encounters:  02/08/24 204 lb 6.4 oz (92.7 kg)  12/27/23 199 lb (90.3 kg)  11/10/23 199 lb (90.3 kg)     Medications:   Current Outpatient Medications:    Alpha-Lipoic Acid 600 MG CAPS, Take 600 mg by mouth 2 (two) times daily., Disp: , Rfl:    BELBUCA  300 MCG FILM, Take by mouth., Disp: , Rfl:    benzonatate  (TESSALON ) 200 MG capsule, Take by mouth., Disp: , Rfl:    buprenorphine  (BUTRANS ) 10 MCG/HR PTWK, Place onto the skin., Disp: , Rfl:    docusate sodium  (COLACE) 50 MG capsule, Take 50 mg by mouth daily as needed., Disp: , Rfl:    fluticasone  (FLONASE ) 50 MCG/ACT nasal spray, Place into the nose., Disp: , Rfl:    gabapentin  (NEURONTIN ) 800 MG tablet, Take 1 tablet by mouth 4 (four) times daily., Disp: , Rfl:    lisinopril  (PRINIVIL ,ZESTRIL ) 40 MG tablet, Take 40 mg by mouth at bedtime., Disp: , Rfl:    meloxicam  (MOBIC ) 7.5 MG tablet, Take 1 tablet by mouth  daily., Disp: , Rfl:    methocarbamol  (ROBAXIN ) 500 MG tablet, Take 1 tablet (500 mg total) by mouth every 6 (six) hours as needed for muscle spasms., Disp: 40 tablet, Rfl: 0   mirtazapine (REMERON) 7.5 MG tablet, Take 7.5 mg by mouth at bedtime., Disp: , Rfl:    nystatin-triamcinolone (MYCOLOG II) cream, Apply 1 Application topically as needed (under breast)., Disp: , Rfl:    ondansetron  (ZOFRAN -ODT) 4 MG disintegrating tablet, Take 4 mg by mouth every 8 (eight) hours as needed., Disp: , Rfl:    pantoprazole  (PROTONIX ) 40 MG tablet, Take 40 mg by mouth every morning., Disp: , Rfl:    senna-docusate (SENOKOT-S) 8.6-50 MG tablet, Take 1 tablet by mouth daily., Disp: 20 tablet, Rfl: 0   topiramate (TOPAMAX) 25 MG tablet, , Disp: , Rfl:    traZODone  (DESYREL ) 100 MG tablet, Take by mouth., Disp: , Rfl:     trimethoprim  (TRIMPEX ) 100 MG tablet, Take 1 tablet (100 mg total) by mouth daily., Disp: 90 tablet, Rfl: 3   venlafaxine  XR (EFFEXOR -XR) 150 MG 24 hr capsule, Take 150 mg by mouth at bedtime. Take with 75 mg to equal 225 mg daily, Disp: , Rfl:    venlafaxine  XR (EFFEXOR -XR) 75 MG 24 hr capsule, Take 75 mg by mouth at bedtime. Take with 150 mg to equal 225 mg daily, Disp: , Rfl:    WIXELA INHUB 100-50 MCG/ACT AEPB, SMARTSIG:1 Puff(s) By Mouth Every 12 Hours, Disp: , Rfl:    XYLITOL MT, Use as directed 2 tablets in the mouth or throat daily as needed (dry mouth)., Disp: , Rfl:   Allergies:  Allergies  Allergen Reactions   Contrast Media [Iodinated Contrast Media] Hives   Duloxetine Swelling    Legs and feet   Pregabalin Swelling    Legs and feet   Iodine Hives   Clarithromycin Nausea Only   Spironolactone-Hctz Rash    Only with iv injection    Past Medical History, Surgical history, Social history, and Family History were reviewed and updated.  Review of Systems: As stated above in HPI  Physical Exam:  height is 5\' 3"  (1.6 m) and weight is 204 lb 6.4 oz (92.7 kg). Her oral temperature is 98.4 F (36.9 C). Her blood pressure is 153/69 (abnormal) and her pulse is 90. Her respiration is 19 and oxygen saturation is 98%.   Physical Exam General: NAD. Uses a cane to ambulate  Cardiovascular: regular rate and rhythm Pulmonary: clear ant fields Abdomen: soft, nontender, + bowel sounds GU: no suprapubic tenderness Extremities: no edema, no joint deformities Skin: no rashes Neurological: Weakness but otherwise nonfocal   Lab Results  Component Value Date   WBC 6.3 02/08/2024   HGB 11.7 (L) 02/08/2024   HCT 35.9 (L) 02/08/2024   MCV 89.3 02/08/2024   PLT 225 02/08/2024     Chemistry      Component Value Date/Time   NA 142 02/08/2024 1402   K 4.2 02/08/2024 1402   CL 107 02/08/2024 1402   CO2 28 02/08/2024 1402   BUN 29 (H) 02/08/2024 1402   CREATININE 1.24 (H)  02/08/2024 1402      Component Value Date/Time   CALCIUM  9.6 02/08/2024 1402   ALKPHOS 88 02/08/2024 1402   AST 16 02/08/2024 1402   ALT 8 02/08/2024 1402   BILITOT 0.3 02/08/2024 1402     Encounter Diagnoses  Name Primary?   Iron  deficiency anemia due to chronic blood loss Yes   B12 deficiency  Elevated serum creatinine    History of GI bleed     Assessment and Plan- Patient is a 73 y.o. 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 UTD with Colo/endo. Currently s/p Venofer  300 mg.   Hgb down to 11.7 today. MCV is 89.3- question nutrient deficiency -B12/folate/iron  pending. Will supplement if needed.   Disposition: RTC 3 months me, labs (CBC w/, CMP, iron , ferritin, retic,b12) -   Sunnie England PA-C 5/6/20254:07 PM

## 2024-02-09 ENCOUNTER — Encounter: Payer: Self-pay | Admitting: Medical Oncology

## 2024-02-09 ENCOUNTER — Other Ambulatory Visit: Payer: Self-pay | Admitting: Medical Oncology

## 2024-02-09 LAB — IRON AND IRON BINDING CAPACITY (CC-WL,HP ONLY)
Iron: 50 ug/dL (ref 28–170)
Saturation Ratios: 14 % (ref 10.4–31.8)
TIBC: 349 ug/dL (ref 250–450)
UIBC: 299 ug/dL (ref 148–442)

## 2024-02-15 ENCOUNTER — Encounter: Payer: Self-pay | Admitting: Hematology & Oncology

## 2024-02-16 ENCOUNTER — Telehealth: Payer: Self-pay | Admitting: Family Medicine

## 2024-02-16 NOTE — Telephone Encounter (Signed)
 Pt called to reschedule appt  due to Cpap time is out.  Appt Rescheduled .

## 2024-02-17 ENCOUNTER — Inpatient Hospital Stay

## 2024-02-17 VITALS — BP 145/76 | HR 87 | Temp 97.3°F | Resp 17

## 2024-02-17 DIAGNOSIS — D500 Iron deficiency anemia secondary to blood loss (chronic): Secondary | ICD-10-CM

## 2024-02-17 MED ORDER — SODIUM CHLORIDE 0.9 % IV SOLN
300.0000 mg | Freq: Once | INTRAVENOUS | Status: AC
  Administered 2024-02-17: 300 mg via INTRAVENOUS
  Filled 2024-02-17: qty 300

## 2024-02-17 MED ORDER — SODIUM CHLORIDE 0.9 % IV SOLN: INTRAVENOUS | Status: DC

## 2024-02-17 NOTE — Progress Notes (Signed)
Pt declined to stay for post infusion observation period. Pt stated she has tolerated medication multiple times prior without difficulty. Pt aware to call clinic with any questions or concerns. Pt verbalized understanding and had no further questions.  ? ?

## 2024-02-17 NOTE — Patient Instructions (Signed)

## 2024-02-21 ENCOUNTER — Ambulatory Visit: Admitting: Physician Assistant

## 2024-02-21 VITALS — BP 159/83 | HR 103 | Ht 64.0 in | Wt 200.0 lb

## 2024-02-21 DIAGNOSIS — N3946 Mixed incontinence: Secondary | ICD-10-CM | POA: Diagnosis not present

## 2024-02-21 NOTE — Patient Instructions (Signed)

## 2024-02-21 NOTE — Progress Notes (Signed)
 PTNS  Session # 14  Health & Social Factors: UTI sx resolved Caffeine: 0 Alcohol: 0 Daytime voids #per day: 6 Night-time voids #per night: 3 Urgency: strong Incontinence Episodes #per day: 4 Ankle used: right Treatment Setting: 16 Feeling/ Response: Both Comments: Patient tolerated well  Performed By: Lydiana Milley, PA-C   Follow Up: 1 month

## 2024-02-23 ENCOUNTER — Inpatient Hospital Stay

## 2024-02-23 VITALS — BP 181/73 | HR 70 | Temp 98.2°F | Resp 18

## 2024-02-23 DIAGNOSIS — D500 Iron deficiency anemia secondary to blood loss (chronic): Secondary | ICD-10-CM | POA: Diagnosis not present

## 2024-02-23 MED ORDER — IRON SUCROSE 20 MG/ML IV SOLN
300.0000 mg | Freq: Once | INTRAVENOUS | Status: AC
  Administered 2024-02-23: 300 mg via INTRAVENOUS
  Filled 2024-02-23: qty 300

## 2024-02-23 MED ORDER — SODIUM CHLORIDE 0.9 % IV SOLN: Freq: Once | INTRAVENOUS | Status: AC

## 2024-02-23 NOTE — Patient Instructions (Signed)

## 2024-02-24 ENCOUNTER — Inpatient Hospital Stay

## 2024-03-01 ENCOUNTER — Ambulatory Visit: Payer: Medicare Other | Admitting: Family Medicine

## 2024-03-02 ENCOUNTER — Inpatient Hospital Stay

## 2024-03-02 VITALS — BP 149/76 | HR 71 | Temp 98.5°F | Resp 18

## 2024-03-02 DIAGNOSIS — D500 Iron deficiency anemia secondary to blood loss (chronic): Secondary | ICD-10-CM | POA: Diagnosis not present

## 2024-03-02 MED ORDER — SODIUM CHLORIDE 0.9 % IV SOLN: INTRAVENOUS | Status: DC

## 2024-03-02 MED ORDER — SODIUM CHLORIDE 0.9 % IV SOLN
300.0000 mg | Freq: Once | INTRAVENOUS | Status: AC
  Administered 2024-03-02: 300.0065 mg via INTRAVENOUS
  Filled 2024-03-02: qty 300

## 2024-03-02 NOTE — Patient Instructions (Signed)

## 2024-03-06 ENCOUNTER — Ambulatory Visit: Admitting: Physician Assistant

## 2024-03-06 ENCOUNTER — Other Ambulatory Visit: Payer: Self-pay | Admitting: Physician Assistant

## 2024-03-06 VITALS — BP 195/95 | HR 90 | Ht 64.0 in | Wt 200.0 lb

## 2024-03-06 DIAGNOSIS — R61 Generalized hyperhidrosis: Secondary | ICD-10-CM

## 2024-03-06 DIAGNOSIS — N3001 Acute cystitis with hematuria: Secondary | ICD-10-CM | POA: Diagnosis not present

## 2024-03-06 DIAGNOSIS — R0689 Other abnormalities of breathing: Secondary | ICD-10-CM

## 2024-03-06 LAB — URINALYSIS, COMPLETE
Bilirubin, UA: NEGATIVE
Glucose, UA: NEGATIVE
Ketones, UA: NEGATIVE
Nitrite, UA: NEGATIVE
Specific Gravity, UA: 1.015 (ref 1.005–1.030)
Urobilinogen, Ur: 0.2 mg/dL (ref 0.2–1.0)
pH, UA: 6 (ref 5.0–7.5)

## 2024-03-06 LAB — MICROSCOPIC EXAMINATION: WBC, UA: >30 /HPF — AB (ref 0–5)

## 2024-03-06 MED ORDER — DOXYCYCLINE HYCLATE 100 MG PO CAPS: 100.0000 mg | ORAL_CAPSULE | Freq: Two times a day (BID) | ORAL | 0 refills | Status: AC

## 2024-03-06 NOTE — Progress Notes (Signed)
 03/06/2024 1:26 PM   Lisa Good [birth year] [id number]  CC: Chief Complaint  Patient presents with   mixed incontenince    HPI: Lisa Good is a 73 y.o. 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   Today she reports 10 days of dysuria, urgency, frequency, and malodorous urine.  She reports subjective fever and nausea several days ago, but these have resolved.  She took Azo, which helped.  In-office UA today positive for 1+ protein, trace intact blood, and 2+ leukocytes; urine microscopy with >30 WBCs/HPF, 3-10 RBCs/HPF, and many bacteria.   PMH: Past Medical History:  Diagnosis Date   Allergy    seasonal   Arthritis    Cataract    Cerebral aneurysm    Chronic kidney disease    Chronic pain syndrome    Complication of anesthesia    trouble waking up after back surgery   DDD (degenerative disc disease), lumbar    Depression    DVT (deep venous thrombosis) (HCC) 2002   Fibromyalgia    GERD (gastroesophageal reflux disease)    Heart murmur    Heart dr said normal for me   History of kidney stones    Hyperlipidemia    Hypertension    Hypothyroidism    Incontinence in female    Iron  deficiency anemia 11/01/2019   Irregular heart beat    Neuromuscular disorder (HCC)    peripheral neuropathy/feet   Obesity    OSA on CPAP    Pancolitis (HCC)    Pre-diabetes    RLS (restless legs syndrome)    UTI (urinary tract infection) 11/2022    Surgical History: Past Surgical History:  Procedure Laterality Date   BIOPSY  06/23/2023   Procedure: BIOPSY;  Surgeon: Corky Diener, Alphonsus Jeans, MD;  Location: [facility] ENDOSCOPY;  Service: Gastroenterology;;   BRAIN SURGERY  2002   aneurysm   CEREBRAL ANEURYSM REPAIR  2002   COLONOSCOPY WITH PROPOFOL  N/A 11/10/2019   Procedure: COLONOSCOPY WITH PROPOFOL ;  Surgeon: Luke Salaam, MD;  Location: [facility];  Service: Endoscopy;   Laterality: N/A;   COLONOSCOPY WITH PROPOFOL  N/A 06/23/2023   Procedure: COLONOSCOPY WITH PROPOFOL ;  Surgeon: Toledo, Alphonsus Jeans, MD;  Location: [facility] ENDOSCOPY;  Service: Gastroenterology;  Laterality: N/A;   COSMETIC SURGERY     ESOPHAGOGASTRODUODENOSCOPY (EGD) WITH PROPOFOL  N/A 11/10/2019   Procedure: ESOPHAGOGASTRODUODENOSCOPY (EGD) WITH PROPOFOL ;  Surgeon: Luke Salaam, MD;  Location: [facility];  Service: Endoscopy;  Laterality: N/A;   ESOPHAGOGASTRODUODENOSCOPY (EGD) WITH PROPOFOL  N/A 06/23/2023   Procedure: ESOPHAGOGASTRODUODENOSCOPY (EGD) WITH PROPOFOL ;  Surgeon: Toledo, Alphonsus Jeans, MD;  Location: [facility] ENDOSCOPY;  Service: Gastroenterology;  Laterality: N/A;   EXTRACORPOREAL SHOCK WAVE LITHOTRIPSY Right 04/27/2019   Procedure: EXTRACORPOREAL SHOCK WAVE LITHOTRIPSY (ESWL);  Surgeon: Lawerence Pressman, MD;  Location: [facility] ORS;  Service: Urology;  Laterality: Right;   EYE SURGERY     JOINT REPLACEMENT Bilateral    knee   KNEE ARTHROPLASTY Left 01/12/2020   Procedure: COMPUTER ASSISTED TOTAL KNEE ARTHROPLASTY;  Surgeon: Arlyne Lame, MD;  Location: [facility] ORS;  Service: Orthopedics;  Laterality: Left;   MALONEY DILATION  06/23/2023   Procedure: MALONEY DILATION;  Surgeon: Cassie Click, MD;  Location: [facility] ENDOSCOPY;  Service: Gastroenterology;;   POLYPECTOMY  11/10/2019   Procedure: POLYPECTOMY;  Surgeon: Luke Salaam, MD;  Location: [facility];  Service: Endoscopy;;   REVERSE SHOULDER ARTHROPLASTY Right 01/06/2022  Procedure: REVERSE SHOULDER ARTHROPLASTY;  Surgeon: Elner Hahn, MD;  Location: [facility] ORS;  Service: Orthopedics;  Laterality: Right;   SPINAL CORD STIMULATOR BATTERY EXCHANGE N/A 02/01/2023   Procedure: INTERNAL PULSE GENERATOR REPLACEMENT ([employer]);  Surgeon: Jodeen Munch, MD;  Location: [facility] ORS;  Service: Neurosurgery;  Laterality: N/A;  LOCAL WITH MAC   SPINAL FUSION  07/10/2019   C4-5 corpectomy with C6-7 ACDF, C2-T3 spinal fusion    SPINE SURGERY     spinal fusion   THORACIC LAMINECTOMY FOR SPINAL CORD STIMULATOR N/A 12/25/2022   Procedure: THORACIC LAMINECTOMY FOR SPINAL CORD STIMULATOR IMPLANTATION ([employer]);  Surgeon: Jodeen Munch, MD;  Location: [facility] ORS;  Service: Neurosurgery;  Laterality: N/A;   TUBAL LIGATION      Home Medications:  Allergies as of 03/06/2024       Reactions   Contrast Media [iodinated Contrast Media] Hives   Duloxetine Swelling   Legs and feet   Pregabalin Swelling   Legs and feet   Iodine Hives   Clarithromycin Nausea Only   Spironolactone-hctz Rash   Only with iv injection        Medication List        Accurate as of March 06, 2024  1:26 PM. If you have any questions, ask your nurse or doctor.          Alpha-Lipoic Acid 600 MG Caps Take 600 mg by mouth 2 (two) times daily.   Belbuca  300 MCG Film Generic drug: Buprenorphine  HCl Take by mouth.   benzonatate  200 MG capsule Commonly known as: TESSALON  Take by mouth.   buprenorphine  10 MCG/HR Ptwk Commonly known as: BUTRANS  Place onto the skin.   docusate sodium  50 MG capsule Commonly known as: COLACE Take 50 mg by mouth daily as needed.   fluticasone  50 MCG/ACT nasal spray Commonly known as: FLONASE  Place into the nose.   gabapentin  800 MG tablet Commonly known as: NEURONTIN  Take 1 tablet by mouth 4 (four) times daily.   lisinopril  40 MG tablet Commonly known as: ZESTRIL  Take 40 mg by mouth at bedtime.   meloxicam  7.5 MG tablet Commonly known as: MOBIC  Take 1 tablet by mouth daily.   methocarbamol  500 MG tablet Commonly known as: ROBAXIN  Take 1 tablet (500 mg total) by mouth every 6 (six) hours as needed for muscle spasms.   mirtazapine 7.5 MG tablet Commonly known as: REMERON Take 7.5 mg by mouth at bedtime.   nystatin-triamcinolone cream Commonly known as: MYCOLOG II Apply 1 Application topically as needed (under breast).   ondansetron  4 MG disintegrating tablet Commonly known  as: ZOFRAN -ODT Take 4 mg by mouth every 8 (eight) hours as needed.   pantoprazole  40 MG tablet Commonly known as: PROTONIX  Take 40 mg by mouth every morning.   senna-docusate 8.6-50 MG tablet Commonly known as: Senokot-S Take 1 tablet by mouth daily.   topiramate 25 MG tablet Commonly known as: TOPAMAX   traZODone  100 MG tablet Commonly known as: DESYREL  Take by mouth.   trimethoprim  100 MG tablet Commonly known as: TRIMPEX  Take 1 tablet (100 mg total) by mouth daily.   venlafaxine  XR 150 MG 24 hr capsule Commonly known as: EFFEXOR -XR Take 150 mg by mouth at bedtime. Take with 75 mg to equal 225 mg daily   venlafaxine  XR 75 MG 24 hr capsule Commonly known as: EFFEXOR -XR Take 75 mg by mouth at bedtime. Take with 150 mg to equal 225 mg daily   Wixela Inhub 100-50 MCG/ACT Aepb Generic drug: fluticasone -salmeterol  SMARTSIG:1 Puff(s) By Mouth Every 12 Hours   XYLITOL MT Use as directed 2 tablets in the mouth or throat daily as needed (dry mouth).        Allergies:  Allergies  Allergen Reactions   Contrast Media [Iodinated Contrast Media] Hives   Duloxetine Swelling    Legs and feet   Pregabalin Swelling    Legs and feet   Iodine Hives   Clarithromycin Nausea Only   Spironolactone-Hctz Rash    Only with iv injection    Family History: Family History  Problem Relation Age of Onset   Hypertension Mother    Depression Mother    Diabetes Mother    Varicose Veins Mother    Hyperlipidemia Sister    Hypertension Sister    Depression Sister    Varicose Veins Sister    Depression Brother    Arthritis Maternal Grandmother    Diabetes Maternal Grandmother    Sleep apnea Neg Hx     Social History:   reports that she quit smoking about 19 years ago. Her smoking use included cigarettes. She started smoking about 39 years ago. She has a 30 pack-year smoking history. She has been exposed to tobacco smoke. She has never used smokeless tobacco. She reports that she  does not currently use alcohol. She reports that she does not use drugs.  Physical Exam: There were no vitals taken for this visit.  Constitutional:  Alert and oriented, no acute distress, nontoxic appearing HEENT: [place], AT Cardiovascular: No clubbing, cyanosis, or edema Respiratory: Normal respiratory effort, no increased work of breathing Skin: No rashes, bruises or suspicious lesions Neurologic: Grossly intact, no focal deficits, moving all 4 extremities Psychiatric: Normal mood and affect  Laboratory Data: Results for orders placed or performed in visit on 03/06/24  Microscopic Examination   Collection Time: 03/06/24  1:21 PM   Urine  Result Value Ref Range   WBC, UA >30 (A) 0 - 5 /hpf   RBC, Urine 3-10 (A) 0 - 2 /hpf   Epithelial Cells (non renal) 0-10 0 - 10 /hpf   Bacteria, UA Many (A) None seen/Few  Urinalysis, Complete   Collection Time: 03/06/24  1:21 PM  Result Value Ref Range   Specific Gravity, UA 1.015 1.005 - 1.030   pH, UA 6.0 5.0 - 7.5   Color, UA Yellow Yellow   Appearance Ur Cloudy (A) Clear   Leukocytes,UA 2+ (A) Negative   Protein,UA 1+ (A) Negative/Trace   Glucose, UA Negative Negative   Ketones, UA Negative Negative   RBC, UA Trace (A) Negative   Bilirubin, UA Negative Negative   Urobilinogen, Ur 0.2 0.2 - 1.0 mg/dL   Nitrite, UA Negative Negative   Microscopic Examination See below:    Assessment & Plan:   1. Acute cystitis with hematuria (Primary) UA appears grossly infected, will start empiric Doxy and send for culture for further evaluation. - Urinalysis, Complete - CULTURE, URINE COMPREHENSIVE - doxycycline  (VIBRAMYCIN ) 100 MG capsule; Take 1 capsule (100 mg total) by mouth 2 (two) times daily for 7 days.  Dispense: 14 capsule; Refill: 0   Return if symptoms worsen or fail to improve.  Kathreen Pare, PA-C  [facility] [place] [address], [address] [place], [place], [place] [address] [phone]

## 2024-03-08 ENCOUNTER — Ambulatory Visit: Payer: Self-pay | Admitting: Urology

## 2024-03-08 LAB — CULTURE, URINE COMPREHENSIVE

## 2024-03-23 ENCOUNTER — Ambulatory Visit: Admitting: Physician Assistant

## 2024-03-23 DIAGNOSIS — N3946 Mixed incontinence: Secondary | ICD-10-CM | POA: Diagnosis not present

## 2024-03-23 NOTE — Progress Notes (Signed)
 PTNS  Session # 15  Health & Social Factors: no change Caffeine: 2 Alcohol: 0 Daytime voids #per day: 5 Night-time voids #per night: 2 Urgency: strong Incontinence Episodes #per day: 3 Ankle used: right Treatment Setting: 19 Feeling/ Response: sensory Comments: none  Performed By: Delane Fear RMA  Follow Up: 1 month

## 2024-03-26 ENCOUNTER — Emergency Department (HOSPITAL_COMMUNITY)

## 2024-03-26 ENCOUNTER — Encounter (HOSPITAL_COMMUNITY): Payer: Self-pay | Admitting: Emergency Medicine

## 2024-03-26 ENCOUNTER — Other Ambulatory Visit: Payer: Self-pay

## 2024-03-26 ENCOUNTER — Emergency Department (HOSPITAL_COMMUNITY)
Admission: EM | Admit: 2024-03-26 | Discharge: 2024-03-26 | Disposition: A | Discharge status: Home / Self Care | Attending: Emergency Medicine | Admitting: Emergency Medicine

## 2024-03-26 DIAGNOSIS — R000 Tachycardia, unspecified: Secondary | ICD-10-CM | POA: Insufficient documentation

## 2024-03-26 DIAGNOSIS — K5792 Diverticulitis of intestine, part unspecified, without perforation or abscess without bleeding: Secondary | ICD-10-CM | POA: Diagnosis not present

## 2024-03-26 DIAGNOSIS — K625 Hemorrhage of anus and rectum: Secondary | ICD-10-CM | POA: Diagnosis not present

## 2024-03-26 DIAGNOSIS — R109 Unspecified abdominal pain: Secondary | ICD-10-CM | POA: Diagnosis present

## 2024-03-26 LAB — CBC
HCT: 40 % (ref 36.0–46.0)
Hemoglobin: 12.9 g/dL (ref 12.0–15.0)
MCH: 29.1 pg (ref 26.0–34.0)
MCHC: 32.3 g/dL (ref 30.0–36.0)
MCV: 90.1 fL (ref 80.0–100.0)
Platelets: 255 10*3/uL (ref 150–400)
RBC: 4.44 MIL/uL (ref 3.87–5.11)
RDW: 15 % (ref 11.5–15.5)
WBC: 7.1 10*3/uL (ref 4.0–10.5)
nRBC: 0 % (ref 0.0–0.2)

## 2024-03-26 LAB — COMPREHENSIVE METABOLIC PANEL WITH GFR
ALT: 12 U/L (ref 0–44)
AST: 19 U/L (ref 15–41)
Albumin: 3.8 g/dL (ref 3.5–5.0)
Alkaline Phosphatase: 93 U/L (ref 38–126)
Anion gap: 16 — ABNORMAL HIGH (ref 5–15)
BUN: 19 mg/dL (ref 8–23)
CO2: 22 mmol/L (ref 22–32)
Calcium: 9.4 mg/dL (ref 8.9–10.3)
Chloride: 105 mmol/L (ref 98–111)
Creatinine, Ser: 1.17 mg/dL — ABNORMAL HIGH (ref 0.44–1.00)
GFR, Estimated: 49 mL/min — ABNORMAL LOW (ref >60)
Glucose, Bld: 139 mg/dL — ABNORMAL HIGH (ref 70–99)
Potassium: 3.5 mmol/L (ref 3.5–5.1)
Sodium: 143 mmol/L (ref 135–145)
Total Bilirubin: 0.5 mg/dL (ref 0.0–1.2)
Total Protein: 6.8 g/dL (ref 6.5–8.1)

## 2024-03-26 MED ORDER — SODIUM CHLORIDE 0.9 % IV BOLUS
1000.0000 mL | Freq: Once | INTRAVENOUS | Status: AC
  Administered 2024-03-26: 1000 mL via INTRAVENOUS

## 2024-03-26 MED ORDER — AMOXICILLIN-POT CLAVULANATE 875-125 MG PO TABS: 1.0000 | ORAL_TABLET | Freq: Two times a day (BID) | ORAL | 0 refills | Status: DC

## 2024-03-26 MED ORDER — AMOXICILLIN-POT CLAVULANATE 875-125 MG PO TABS
1.0000 | ORAL_TABLET | Freq: Once | ORAL | Status: AC
  Administered 2024-03-26: 1 via ORAL
  Filled 2024-03-26: qty 1

## 2024-03-26 NOTE — Discharge Instructions (Addendum)
 You were seen in the emergency department for diarrhea abdominal pain and rectal bleeding.  Your blood work showed a normal white count and a stable hemoglobin or red blood cell count.  Your CAT scan showed diverticulitis.  We are starting you on antibiotics.  Please start with a clear liquid diet and slowly advance.  Follow-up with your primary care doctor.  Return to the emergency department if any worsening pain or bleeding, high fevers.

## 2024-03-26 NOTE — ED Notes (Signed)
 Extra SST drawn

## 2024-03-26 NOTE — ED Triage Notes (Addendum)
 PT complains of bloody diarrhea started this morning. Pt states blood is bright right and feels she lost a significant amount of blood. Chills x 3 days. Light abd cramping. Denies any nausea or vomiting. Does feel lightheaded. Has not had BP medication in several days  Also complains of issues with back stimulator shooting pains up back. Pt states something doesn't feel right with stimulator and figured out a way to turn it off today.

## 2024-03-26 NOTE — ED Provider Triage Note (Signed)
 Emergency Medicine Provider Triage Evaluation Note  Lisa Good , a 73 y.o. female  was evaluated in triage.  Pt complains of bloody diarrhea and abdominal pain that has been persistent over the last 24 hours however onset of diarrhea was approximately 2 to 3 days ago.  Noted antibiotic therapy recently of doxycycline  for UTI approximately 1 week ago.  No notable previous medical history of inflammatory bowel disease however does have GERD and has had previous episodes of pancolitis..  Review of Systems  Positive: Abdominal pain, bloody stool Negative: Weakness, dizziness, shortness of breath  Physical Exam  BP (!) 181/112   Pulse (!) 120   Temp 99 F (37.2 C) (Oral)   Resp 20   Wt 90 kg   SpO2 98%   BMI 34.06 kg/m  Gen:   Awake, no distress though visibly uncomfortable Resp:  Normal effort  MSK:   Moves extremities without difficulty  Other:  Mild diffuse tenderness to the abdomen with increased tenderness to the epigastrium and to the right upper quadrant.  Medical Decision Making  Medically screening exam initiated at 1:32 PM.  Appropriate orders placed.  Lisa Good was informed that the remainder of the evaluation will be completed by another provider, this initial triage assessment does not replace that evaluation, and the importance of remaining in the ED until their evaluation is complete.  Based on exam of this patient, believe this may be secondary to colitis from previous antibiotic therapy.  Also rule out diverticulitis and bowel perforation.  Ordered noncontrast CT of the abdomen secondary to severe contrast allergy.   Lisa Good, Lisa Good 03/26/24 [phone]

## 2024-03-26 NOTE — ED Provider Notes (Signed)
 [place] EMERGENCY DEPARTMENT AT [facility] Provider Note   CSN: [id number] Arrival date & time: 03/26/24  1223     Patient presents with: Rectal Bleeding   Lisa Good is a 73 y.o. female.  She is here with a complaint of diarrhea 3-4 times a day for the last 4 days.  Today she continued to have diarrhea along with new bright red blood.  Associated with some uncomfortable abdominal feeling.  Feeling hot and cold but she does not think she has had a fever.  No urinary symptoms.  She is on chronic suppressive antibiotics for UTIs and recently had a course of doxycycline  also.  She is also having some tingling over her whole body which she attributes to her spinal stimulator that she feels is malfunctioning.   The history is provided by the patient.  Rectal Bleeding Quality:  Bright red Amount:  Moderate Duration:  1 day Timing:  Intermittent Chronicity:  New Context: defecation and diarrhea   Similar prior episodes: no   Relieved by:  Nothing Associated symptoms: abdominal pain   Associated symptoms: no fever, no hematemesis, no light-headedness, no loss of consciousness and no vomiting   Risk factors: no anticoagulant use        Prior to Admission medications   Medication Sig Start Date End Date Taking? Authorizing Provider  Alpha-Lipoic Acid 600 MG CAPS Take 600 mg by mouth 2 (two) times daily.    [provider]  benzonatate  (TESSALON ) 200 MG capsule Take by mouth. 12/02/23   [provider]  fluticasone  (FLONASE ) 50 MCG/ACT nasal spray Place into the nose. As needed 12/01/23 11/30/24  [provider]  gabapentin  (NEURONTIN ) 400 MG capsule Take 400 mg by mouth 2 (two) times daily.    [provider]  JOURNAVX 50 MG TABS SMARTSIG:Tablet(s)    [provider]  lisinopril  (PRINIVIL ,ZESTRIL ) 40 MG tablet Take 40 mg by mouth at bedtime.    [provider]  methocarbamol  (ROBAXIN ) 500 MG tablet Take 1 tablet (500 mg  total) by mouth every 6 (six) hours as needed for muscle spasms. 08/19/23   Hilma Hastings, PA-C  mirtazapine (REMERON) 15 MG tablet Take 15 mg by mouth at bedtime. 03/21/24   [provider]  ondansetron  (ZOFRAN -ODT) 4 MG disintegrating tablet Take 4 mg by mouth every 8 (eight) hours as needed. 10/08/23   [provider]  pantoprazole  (PROTONIX ) 40 MG tablet Take 40 mg by mouth every morning. 11/17/21   [provider]  senna-docusate (SENOKOT-S) 8.6-50 MG tablet Take 1 tablet by mouth daily. 01/20/24   Long, Joshua G, MD  topiramate (TOPAMAX) 25 MG tablet  08/01/16   [provider]  venlafaxine  XR (EFFEXOR -XR) 150 MG 24 hr capsule Take 150 mg by mouth at bedtime. Take with 75 mg to equal 225 mg daily 10/26/19   [provider]  venlafaxine  XR (EFFEXOR -XR) 75 MG 24 hr capsule Take 75 mg by mouth at bedtime. Take with 150 mg to equal 225 mg daily 10/16/21   [provider]  NAPOLEON INHUB 100-50 MCG/ACT AEPB SMARTSIG:1 Puff(s) By Mouth Every 12 Hours    [provider]  XYLITOL MT Use as directed 2 tablets in the mouth or throat daily as needed (dry mouth).    [provider]    Allergies: Contrast media [iodinated contrast media], Duloxetine, Pregabalin, Iodine, Clarithromycin, and Spironolactone-hctz    Review of Systems  Constitutional:  Negative for fever.  Respiratory:  Negative for shortness  of breath.   Cardiovascular:  Negative for chest pain.  Gastrointestinal:  Positive for abdominal pain, diarrhea and hematochezia. Negative for hematemesis and vomiting.  Neurological:  Negative for loss of consciousness and light-headedness.    Updated Vital Signs BP (!) 181/112   Pulse (!) 112   Temp 99 F (37.2 C) (Oral)   Resp 20   Wt 90 kg   SpO2 95%   BMI 34.06 kg/m   Physical Exam Vitals and nursing note reviewed.  Constitutional:      General: She is not in acute distress.    Appearance: Normal appearance. She is  well-developed.  HENT:     Head: Normocephalic and atraumatic.   Eyes:     Conjunctiva/sclera: Conjunctivae normal.    Cardiovascular:     Rate and Rhythm: Regular rhythm. Tachycardia present.     Heart sounds: No murmur heard. Pulmonary:     Effort: Pulmonary effort is normal. No respiratory distress.     Breath sounds: Normal breath sounds. No stridor. No wheezing.  Abdominal:     Palpations: Abdomen is soft.     Tenderness: There is no abdominal tenderness. There is no guarding or rebound.   Musculoskeletal:        General: No tenderness or deformity. Normal range of motion.     Cervical back: Neck supple.   Skin:    General: Skin is warm and dry.   Neurological:     General: No focal deficit present.     Mental Status: She is alert.     GCS: GCS eye subscore is 4. GCS verbal subscore is 5. GCS motor subscore is 6.     Motor: No weakness.     (all labs ordered are listed, but only abnormal results are displayed) Labs Reviewed  COMPREHENSIVE METABOLIC PANEL WITH GFR - Abnormal; Notable for the following components:      Result Value   Glucose, Bld 139 (*)    Creatinine, Ser 1.17 (*)    GFR, Estimated 49 (*)    Anion gap 16 (*)    All other components within normal limits  GASTROINTESTINAL PANEL BY PCR, STOOL (REPLACES STOOL CULTURE)  CBC  TYPE AND SCREEN    EKG: None  Radiology: CT ABDOMEN PELVIS WO CONTRAST Result Date: 03/26/2024 CLINICAL DATA:  Abdominal pain. Bloody diarrhea and abdominal cramping. EXAM: CT ABDOMEN AND PELVIS WITHOUT CONTRAST TECHNIQUE: Multidetector CT imaging of the abdomen and pelvis was performed following the standard protocol without IV contrast. RADIATION DOSE REDUCTION: This exam was performed according to the departmental dose-optimization program which includes automated exposure control, adjustment of the mA and/or kV according to patient size and/or use of iterative reconstruction technique. COMPARISON:  01/20/2024. FINDINGS:  Lower chest: The heart is normal in size and there is a small pericardial effusion. Calcification of the mitral valve annulus is noted. Stable nodules are noted in the left lower lobe measuring up to 4 mm. Hepatobiliary: No focal abnormality in the liver. A stone is seen in the gallbladder. No biliary ductal dilatation. Pancreas: Unremarkable. No pancreatic ductal dilatation or surrounding inflammatory changes. Spleen: Normal in size without focal abnormality. Adrenals/Urinary Tract: The adrenal glands are within normal limits. Cysts are present in the right kidney. No renal calculus or hydronephrosis. The bladder is unremarkable. Stomach/Bowel: There is a small hiatal hernia. The stomach is otherwise within normal limits. No bowel obstruction, free air, or pneumatosis is seen. Scattered diverticula are present along the colon. There is bowel wall thickening  with subtle adjacent fat stranding involving the sigmoid colon in the left lower quadrant. No obvious hemorrhage is seen, however examination of the colon is limited due to ingested radiopaque material. Appendix appears normal. Vascular/Lymphatic: Aortic atherosclerosis. No enlarged abdominal or pelvic lymph nodes. Reproductive: Uterus and bilateral adnexa are unremarkable. Other: No abdominopelvic ascites. A fluid collection is noted in the low anterior abdominal wall measuring 6.7 x 1.8 cm, unchanged from the previous exam. A fat containing umbilical hernia is noted. Bilateral breast implants are noted present. A neurostimulator device is present in the right flank with leads terminating in the thoracic spinal canal. Musculoskeletal: Multilevel degenerative changes are present in the thoracolumbar spine with associated fusion hardware. No acute osseous abnormality. IMPRESSION: 1. Findings compatible with sigmoid diverticulitis. No abscess or free air. 2. Stable left lower lobe pulmonary nodules measuring up to 4 mm. No follow-up needed if patient is  low-risk.This recommendation follows the consensus statement: Guidelines for Management of Incidental Pulmonary Nodules Detected on CT Images: From the Fleischner Society 2017; Radiology 2017; 284:228-243. 3. Cholelithiasis. 4. Aortic atherosclerosis. 5. Remaining incidental findings as described above. Electronically Signed   By: Leita Birmingham M.D.   On: 03/26/2024 16:01     Procedures   Medications Ordered in the ED  sodium chloride  0.9 % bolus 1,000 mL (0 mLs Intravenous Stopped 03/26/24 1638)  amoxicillin -clavulanate (AUGMENTIN ) 875-125 MG per tablet 1 tablet (1 tablet Oral Given 03/26/24 1642)    Clinical Course as of 03/27/24 1045  Sun Mar 26, 2024  1621 Patient's workup showing normal white count normal hemoglobin.  Creatinine baseline.  CT shows acute diverticulitis.  Reviewed with patient and she feels she can manage her symptoms at home.  Given first dose of Augmentin  here.  Will have her finish up her fluids and p.o. trial. [MB]    Clinical Course User Index [MB] Towana Ozell BROCKS, MD                                 Medical Decision Making Amount and/or Complexity of Data Reviewed Labs: ordered. Radiology: ordered.  Risk Prescription drug management.   This patient complains of diarrhea now associated with blood, abdominal pain, paresthesias; this involves an extensive number of treatment Options and is a complaint that carries with it a high risk of complications and morbidity. The differential includes diverticulitis, colitis, hemorrhoids, infectious diarrhea, ischemic gut  I ordered, reviewed and interpreted labs, which included CBC normal chemistries with mildly elevated glucose and creatinine I ordered medication IV fluids oral antibiotics and reviewed PMP when indicated. I ordered imaging studies which included CT abdomen and pelvis and I independently    visualized and interpreted imaging which showed acute uncomplicated diverticulitis, pulmonary nodule Additional  history obtained from patient's husband Previous records obtained and reviewed in epic including recent PCP and neurology notes Cardiac monitoring reviewed, sinus tachycardia Social determinants considered, barriers including physically inactive depression stress Critical Interventions: None  After the interventions stated above, I reevaluated the patient and found patient to be feeling somewhat better and no active bleeding here Admission and further testing considered, we discussed admission to the hospital versus outpatient management and she is comfortable plan for outpatient.  Will prescribe antibiotics.  Return instructions discussed.      Final diagnoses:  Rectal bleeding  Acute diverticulitis    ED Discharge Orders          Ordered    amoxicillin -clavulanate (  AUGMENTIN ) 875-125 MG tablet  Every 12 hours        03/26/24 1637               Towana Ozell BROCKS, MD 03/27/24 1048

## 2024-03-27 ENCOUNTER — Emergency Department (HOSPITAL_COMMUNITY): Admission: EM | Admit: 2024-03-27 | Discharge: 2024-03-27 | Disposition: A | Discharge status: Home / Self Care

## 2024-03-27 ENCOUNTER — Encounter (HOSPITAL_COMMUNITY): Payer: Self-pay

## 2024-03-27 ENCOUNTER — Ambulatory Visit: Admission: RE | Admit: 2024-03-27 | Source: Ambulatory Visit

## 2024-03-27 ENCOUNTER — Other Ambulatory Visit: Payer: Self-pay

## 2024-03-27 DIAGNOSIS — K5732 Diverticulitis of large intestine without perforation or abscess without bleeding: Secondary | ICD-10-CM | POA: Diagnosis not present

## 2024-03-27 DIAGNOSIS — D649 Anemia, unspecified: Secondary | ICD-10-CM | POA: Insufficient documentation

## 2024-03-27 DIAGNOSIS — K625 Hemorrhage of anus and rectum: Secondary | ICD-10-CM

## 2024-03-27 DIAGNOSIS — R000 Tachycardia, unspecified: Secondary | ICD-10-CM | POA: Diagnosis not present

## 2024-03-27 DIAGNOSIS — K5792 Diverticulitis of intestine, part unspecified, without perforation or abscess without bleeding: Secondary | ICD-10-CM

## 2024-03-27 LAB — CBC
HCT: 34.9 % — ABNORMAL LOW (ref 36.0–46.0)
Hemoglobin: 11.3 g/dL — ABNORMAL LOW (ref 12.0–15.0)
MCH: 29.8 pg (ref 26.0–34.0)
MCHC: 32.4 g/dL (ref 30.0–36.0)
MCV: 92.1 fL (ref 80.0–100.0)
Platelets: 243 10*3/uL (ref 150–400)
RBC: 3.79 MIL/uL — ABNORMAL LOW (ref 3.87–5.11)
RDW: 15.3 % (ref 11.5–15.5)
WBC: 6.7 10*3/uL (ref 4.0–10.5)
nRBC: 0 % (ref 0.0–0.2)

## 2024-03-27 LAB — COMPREHENSIVE METABOLIC PANEL WITH GFR
ALT: 12 U/L (ref 0–44)
AST: 21 U/L (ref 15–41)
Albumin: 3.6 g/dL (ref 3.5–5.0)
Alkaline Phosphatase: 81 U/L (ref 38–126)
Anion gap: 14 (ref 5–15)
BUN: 15 mg/dL (ref 8–23)
CO2: 24 mmol/L (ref 22–32)
Calcium: 9.4 mg/dL (ref 8.9–10.3)
Chloride: 104 mmol/L (ref 98–111)
Creatinine, Ser: 1.23 mg/dL — ABNORMAL HIGH (ref 0.44–1.00)
GFR, Estimated: 46 mL/min — ABNORMAL LOW (ref >60)
Glucose, Bld: 131 mg/dL — ABNORMAL HIGH (ref 70–99)
Potassium: 3.6 mmol/L (ref 3.5–5.1)
Sodium: 142 mmol/L (ref 135–145)
Total Bilirubin: 0.4 mg/dL (ref 0.0–1.2)
Total Protein: 6.5 g/dL (ref 6.5–8.1)

## 2024-03-27 LAB — TYPE AND SCREEN
ABO/RH(D): O POS
ABO/RH(D): O POS
Antibody Screen: NEGATIVE
Antibody Screen: NEGATIVE

## 2024-03-27 MED ORDER — ONDANSETRON 4 MG PO TBDP
8.0000 mg | ORAL_TABLET | Freq: Once | ORAL | Status: AC
  Administered 2024-03-27: 8 mg via ORAL
  Filled 2024-03-27: qty 2

## 2024-03-27 NOTE — ED Provider Notes (Signed)
 [place] EMERGENCY DEPARTMENT AT [facility] Provider Note   CSN: [id number] Arrival date & time: 03/27/24  1254     Patient presents with: Rectal Bleeding   Lisa Good is a 73 y.o. female.   73 year old female presents for evaluation of rectal bleeding.  She was here yesterday and was treated for diverticulitis.  She had some mild bleeding yesterday but states overnight it got much worse and now she is passing blood clots.  States that she has been fatigued and very short of breath as well as anxious feeling.  She has been taking Augmentin  and is still having abdominal pain that is not worse than was yesterday.  States is located in the middle of the abdomen.  She states she has never seen GI before.  Denies any other symptoms or concerns at this time.   Rectal Bleeding Associated symptoms: abdominal pain   Associated symptoms: no fever and no vomiting        Prior to Admission medications   Medication Sig Start Date End Date Taking? Authorizing Provider  Alpha-Lipoic Acid 600 MG CAPS Take 600 mg by mouth 2 (two) times daily.    [provider]  amoxicillin -clavulanate (AUGMENTIN ) 875-125 MG tablet Take 1 tablet by mouth every 12 (twelve) hours. 03/26/24   Towana Ozell BROCKS, MD  benzonatate  (TESSALON ) 200 MG capsule Take by mouth. 12/02/23   [provider]  fluticasone  (FLONASE ) 50 MCG/ACT nasal spray Place into the nose. As needed 12/01/23 11/30/24  [provider]  gabapentin  (NEURONTIN ) 400 MG capsule Take 400 mg by mouth 2 (two) times daily.    [provider]  JOURNAVX 50 MG TABS SMARTSIG:Tablet(s)    [provider]  lisinopril  (PRINIVIL ,ZESTRIL ) 40 MG tablet Take 40 mg by mouth at bedtime.    [provider]  methocarbamol  (ROBAXIN ) 500 MG tablet Take 1 tablet (500 mg total) by mouth every 6 (six) hours as needed for muscle spasms. 08/19/23   Hilma Hastings, PA-C  mirtazapine (REMERON) 15 MG tablet Take 15 mg by  mouth at bedtime. 03/21/24   [provider]  ondansetron  (ZOFRAN -ODT) 4 MG disintegrating tablet Take 4 mg by mouth every 8 (eight) hours as needed. 10/08/23   [provider]  pantoprazole  (PROTONIX ) 40 MG tablet Take 40 mg by mouth every morning. 11/17/21   [provider]  senna-docusate (SENOKOT-S) 8.6-50 MG tablet Take 1 tablet by mouth daily. 01/20/24   Long, Joshua G, MD  topiramate (TOPAMAX) 25 MG tablet  08/01/16   [provider]  venlafaxine  XR (EFFEXOR -XR) 150 MG 24 hr capsule Take 150 mg by mouth at bedtime. Take with 75 mg to equal 225 mg daily 10/26/19   [provider]  venlafaxine  XR (EFFEXOR -XR) 75 MG 24 hr capsule Take 75 mg by mouth at bedtime. Take with 150 mg to equal 225 mg daily 10/16/21   [provider]  NAPOLEON INHUB 100-50 MCG/ACT AEPB SMARTSIG:1 Puff(s) By Mouth Every 12 Hours    [provider]  XYLITOL MT Use as directed 2 tablets in the mouth or throat daily as needed (dry mouth).    [provider]    Allergies: Contrast media [iodinated contrast media], Duloxetine, Pregabalin, Iodine, Clarithromycin, and Spironolactone-hctz    Review of Systems  Constitutional:  Positive for fatigue. Negative for chills and fever.  HENT:  Negative for ear pain and sore throat.   Eyes:  Negative for pain and visual disturbance.  Respiratory:  Positive for shortness  of breath. Negative for cough.   Cardiovascular:  Negative for chest pain and palpitations.  Gastrointestinal:  Positive for abdominal pain, blood in stool, diarrhea and hematochezia. Negative for vomiting.  Genitourinary:  Negative for dysuria and hematuria.  Musculoskeletal:  Negative for arthralgias and back pain.  Skin:  Negative for color change and rash.  Neurological:  Negative for seizures and syncope.  All other systems reviewed and are negative.   Updated Vital Signs BP (!) 169/71 (BP Location: Right Arm)   Pulse (!) 113   Temp 98.2 F  (36.8 C) (Oral)   Resp 17   Ht 5' 4 (1.626 m)   Wt 90 kg   SpO2 100%   BMI 34.06 kg/m   Physical Exam Vitals and nursing note reviewed.  Constitutional:      General: She is not in acute distress.    Appearance: Normal appearance. She is well-developed. She is not ill-appearing.  HENT:     Head: Normocephalic and atraumatic.   Eyes:     Conjunctiva/sclera: Conjunctivae normal.    Cardiovascular:     Rate and Rhythm: Regular rhythm. Tachycardia present.     Heart sounds: Normal heart sounds. No murmur heard. Pulmonary:     Effort: Pulmonary effort is normal. No respiratory distress.     Breath sounds: Normal breath sounds.  Abdominal:     General: There is no distension.     Palpations: Abdomen is soft. There is no mass.     Tenderness: There is abdominal tenderness.     Hernia: No hernia is present.   Musculoskeletal:        General: No swelling.     Cervical back: Neck supple.   Skin:    General: Skin is warm and dry.     Capillary Refill: Capillary refill takes less than 2 seconds.   Neurological:     General: No focal deficit present.     Mental Status: She is alert.   Psychiatric:        Mood and Affect: Mood normal.     (all labs ordered are listed, but only abnormal results are displayed) Labs Reviewed  COMPREHENSIVE METABOLIC PANEL WITH GFR - Abnormal; Notable for the following components:      Result Value   Glucose, Bld 131 (*)    Creatinine, Ser 1.23 (*)    GFR, Estimated 46 (*)    All other components within normal limits  CBC - Abnormal; Notable for the following components:   RBC 3.79 (*)    Hemoglobin 11.3 (*)    HCT 34.9 (*)    All other components within normal limits  TYPE AND SCREEN    EKG: None  Radiology: CT ABDOMEN PELVIS WO CONTRAST Result Date: 03/26/2024 CLINICAL DATA:  Abdominal pain. Bloody diarrhea and abdominal cramping. EXAM: CT ABDOMEN AND PELVIS WITHOUT CONTRAST TECHNIQUE: Multidetector CT imaging of the abdomen  and pelvis was performed following the standard protocol without IV contrast. RADIATION DOSE REDUCTION: This exam was performed according to the departmental dose-optimization program which includes automated exposure control, adjustment of the mA and/or kV according to patient size and/or use of iterative reconstruction technique. COMPARISON:  01/20/2024. FINDINGS: Lower chest: The heart is normal in size and there is a small pericardial effusion. Calcification of the mitral valve annulus is noted. Stable nodules are noted in the left lower lobe measuring up to 4 mm. Hepatobiliary: No focal abnormality in the liver. A stone is seen in the gallbladder. No biliary ductal  dilatation. Pancreas: Unremarkable. No pancreatic ductal dilatation or surrounding inflammatory changes. Spleen: Normal in size without focal abnormality. Adrenals/Urinary Tract: The adrenal glands are within normal limits. Cysts are present in the right kidney. No renal calculus or hydronephrosis. The bladder is unremarkable. Stomach/Bowel: There is a small hiatal hernia. The stomach is otherwise within normal limits. No bowel obstruction, free air, or pneumatosis is seen. Scattered diverticula are present along the colon. There is bowel wall thickening with subtle adjacent fat stranding involving the sigmoid colon in the left lower quadrant. No obvious hemorrhage is seen, however examination of the colon is limited due to ingested radiopaque material. Appendix appears normal. Vascular/Lymphatic: Aortic atherosclerosis. No enlarged abdominal or pelvic lymph nodes. Reproductive: Uterus and bilateral adnexa are unremarkable. Other: No abdominopelvic ascites. A fluid collection is noted in the low anterior abdominal wall measuring 6.7 x 1.8 cm, unchanged from the previous exam. A fat containing umbilical hernia is noted. Bilateral breast implants are noted present. A neurostimulator device is present in the right flank with leads terminating in the  thoracic spinal canal. Musculoskeletal: Multilevel degenerative changes are present in the thoracolumbar spine with associated fusion hardware. No acute osseous abnormality. IMPRESSION: 1. Findings compatible with sigmoid diverticulitis. No abscess or free air. 2. Stable left lower lobe pulmonary nodules measuring up to 4 mm. No follow-up needed if patient is low-risk.This recommendation follows the consensus statement: Guidelines for Management of Incidental Pulmonary Nodules Detected on CT Images: From the Fleischner Society 2017; Radiology 2017; 284:228-243. 3. Cholelithiasis. 4. Aortic atherosclerosis. 5. Remaining incidental findings as described above. Electronically Signed   By: Leita Birmingham M.D.   On: 03/26/2024 16:01     Procedures   Medications Ordered in the ED  ondansetron  (ZOFRAN -ODT) disintegrating tablet 8 mg (8 mg Oral Given 03/27/24 1542)                                    Medical Decision Making Medical Decision Making Nursing notes are reviewed. Differential diagnosis for this patient would include but not limited to: Rectal bleeding, hemorrhoids, diverticular bleeding, symptomatic anemia, acute blood loss anemia, other  Records reviewed: Prior records reviewed: ER visit from yesterday reviewed and patient was diagnosed with diverticulitis and started on antibiotics  Consults: GI-I spoke with on-call GI Dr. Mimi were planning to consult and see the patient tomorrow morning but no acute colonoscopy at this time due to her diverticulitis  Emergency Department Course:  Vital signs and pulse oximetry are reviewed, evaluated by myself and found to be within normal limits prior to final disposition. Findings of laboratory testing and medical imaging are discussed with patient and family that is available. Patient agrees with the medical care plan as follows:  Patient's records from yesterday reviewed and she was started on antibiotics for diverticulitis.  She had rectal  bleeding last night and this morning and has dropped her hemoglobin over a point on review of her labs.  She was offered admission but states she has not had any rectally while she has been here she is actually feeling improved.  I strongly recommend admission considering she was slightly tachycardic and had a new drop in her hemoglobin but she declined like to go home.  GI was consulted as above as patient was going to be admitted.  She will plan to follow-up with her GI doctor and primary care doctor and return to the ER for new or  worsening symptoms.  Advised to continue her antibiotics as prescribed.  She will be discharged.  Problems Addressed: Diverticulitis: acute illness or injury Rectal bleeding: undiagnosed new problem with uncertain prognosis Symptomatic anemia: undiagnosed new problem with uncertain prognosis  Amount and/or Complexity of Data Reviewed External Data Reviewed: notes. Labs: ordered. Decision-making details documented in ED Course.  Risk OTC drugs. Prescription drug management. Decision regarding hospitalization.     Final diagnoses:  Symptomatic anemia  Rectal bleeding  Diverticulitis    ED Discharge Orders     None          Gennaro Duwaine CROME, DO 03/27/24 2307

## 2024-03-27 NOTE — ED Provider Triage Note (Signed)
 Emergency Medicine Provider Triage Evaluation Note  Lisa Good , a 73 y.o. female  was evaluated in triage.  Pt complains of continued bloody stools and blood clots, continued pain and discomfort.  Assessed here for yesterday and diagnosed for diverticulitis.  Given course of Augmentin , has had 2 doses but presents to the ED today for continued bleeding and abdominal discomfort along with nausea..  Review of Systems  Positive: Bloody stools, nausea, abdominal pain Negative:   Physical Exam  BP (!) 154/101 (BP Location: Right Arm)   Pulse (!) 116   Temp 98.3 F (36.8 C) (Oral)   Resp 17   Ht 5' 4 (1.626 m)   Wt 90 kg   SpO2 98%   BMI 34.06 kg/m  Gen:   Awake, no distress   Resp:  Normal effort  MSK:   Moves extremities without difficulty  Other:  Mild diffuse tenderness to the across the entirety of the abdomen  Medical Decision Making  Medically screening exam initiated at 3:29 PM.  Appropriate orders placed.  Lisa Good was informed that the remainder of the evaluation will be completed by another provider, this initial triage assessment does not replace that evaluation, and the importance of remaining in the ED until their evaluation is complete.  Lab workup reinitiated for workup of abdominal pain, no further imaging at this time as she had extensive imaging done yesterday.   Lisa Good, Lisa Good 03/27/24 [id number]

## 2024-03-27 NOTE — Discharge Instructions (Addendum)
 Please call your primary care doctor in the morning to arrange a close follow-up appointment for later this week.  Please discuss with them following up with GI in the office or call your GI doctor to follow-up.  Stay on your other medications and antibiotics as prescribed.  Your hemoglobin dropped and it was recommended that you stay for hospital admission but you declined with wanted to go home instead.  If your symptoms get worse your bleeding continues please return to the emergency department.

## 2024-03-27 NOTE — ED Notes (Addendum)
 Pt reports that SOB had resolved ambulating from restroom to room. Pt reports that no blood was present when using the restroom.

## 2024-03-27 NOTE — ED Triage Notes (Signed)
 Pt had diarrhea that started Thursday. Today pt noticed bright red blood with blood clots in stool.  Pt has lower middle abdominal pain and nauseous.    Pt endorses chills and sob.  Pt denies cp and fever.   Pt started abx yesterday for diverticulitis. Pt had a colonoscopy approx. 8 months ago.

## 2024-03-27 NOTE — ED Notes (Signed)
 Patient ambulated to the restroom independently and reports shortness of breath in the restroom going on the last 48 hrs, patient also reports that she wants to go home. Provider notified.

## 2024-04-18 NOTE — Progress Notes (Unsigned)
PTNS  Session # monthly maintenance  Health & Social Factors: No change Caffeine: 2 Alcohol: 1 Daytime voids #per day: 5 Night-time voids #per night: 2 Urgency: None Incontinence Episodes #per day: 0 Ankle used: Left Treatment Setting: 5 Feeling/ Response: Sensory Comments: Patient tolerated the procedure   Performed By: Michiel Cowboy, PA-C   Follow Up: One month for PTNS maintenance

## 2024-04-19 ENCOUNTER — Ambulatory Visit: Admitting: Urology

## 2024-04-19 DIAGNOSIS — N3946 Mixed incontinence: Secondary | ICD-10-CM

## 2024-04-19 LAB — URINALYSIS, COMPLETE
Bilirubin, UA: NEGATIVE
Glucose, UA: NEGATIVE
Ketones, UA: NEGATIVE
Nitrite, UA: NEGATIVE
Protein,UA: NEGATIVE
RBC, UA: NEGATIVE
Specific Gravity, UA: 1.02 (ref 1.005–1.030)
Urobilinogen, Ur: 0.2 mg/dL (ref 0.2–1.0)
pH, UA: 6 (ref 5.0–7.5)

## 2024-04-19 LAB — MICROSCOPIC EXAMINATION: Bacteria, UA: NONE SEEN

## 2024-04-20 ENCOUNTER — Ambulatory Visit: Admitting: Urology

## 2024-04-20 ENCOUNTER — Telehealth: Payer: Self-pay | Admitting: Gastroenterology

## 2024-04-20 NOTE — Telephone Encounter (Signed)
 Good morning Dr. San   The following patient is being referred to us  for acute diverticulitis and rectal bleeding. She has been a patient of [facility] and does not wish to wait until January for an appointment. Records are available with care everywhere. Please advise. Thank you.

## 2024-04-21 NOTE — Telephone Encounter (Addendum)
Patient has been scheduled for 7/21 

## 2024-04-23 ENCOUNTER — Ambulatory Visit: Payer: Self-pay | Admitting: Urology

## 2024-04-23 LAB — CULTURE, URINE COMPREHENSIVE

## 2024-04-24 ENCOUNTER — Ambulatory Visit: Admitting: Gastroenterology

## 2024-04-24 NOTE — Progress Notes (Signed)
 Chief Complaint:acute diverticulitis, rectal bleeding  Primary GI Doctor: Dr. San  HPI:  Patient is a  73  year old female patient with past medical history of GERD, hypertension,depression, Fibromyalgia, DVT, iron  deficiency anemia, who was referred to me by Marikay Eva POUR, PA on 04/19/24 for a complaint of acute diverticulitis, rectal bleeding.  02/08/2024 patient seen by oncology for IDA. Hgb down to 11.7 today. Pending other labs. Reviewed entire note.  Patient seen in ED on 03/26/24 for rectal bleeding. Patient's workup showing normal white count normal hemoglobin. Creatinine baseline. CT shows acute diverticulitis. Reviewed with patient and she feels she can manage her symptoms at home. Given first dose of Augmentin  here. Will have her finish up her fluids and p.o. trial.  Patient seen in ED on 03/27/24 for rectal bleeding. She was here yesterday and was treated for diverticulitis. She has been taking Augmentin  and is still having abdominal pain that is not worse than was yesterday. She had rectal bleeding last night and this morning and has dropped her hemoglobin over a point on review of her labs. She was offered admission but states she has not had any rectally while she has been here she is actually feeling improved.   Interval History   Patient presents for evaluation after recent ED visit for diverticulitis and rectal bleeding. She reports she had lower abdominal pain with constipation that prompted her to go to ER the first time. The next day she returned to the ER when she passed a large amount of blood rectally with clots.  She reports she has not had rectal bleeding since the ED visit on 6/23.  Patient did complete the 2 weeks of antibiotic prescribed for diverticulitis.  Patient does report history of chronic constipation and states she will go 4 to 5 days without a bowel movement followed by a few days of having frequent more loose stools.  Patient is currently taking taking  Senokot 1 capsule po daily.  We discussed adding over-the-counter MiraLAX  or milk of magnesium  however patient does not tolerate the taste of liquid or powder solutions.  We also discussed over-the-counter suppositories like Dulcolax which she also does not prefer.  She reports the abdominal pain has improved.  She currently is having issues where she often has the urge to have a bowel movement with no result.  Patient also has had a lot of bloating and flatulence. Her last bowel movement was yesterday and reports large amount.  She denies incontinence of bowels or urgency. She reports she is back to regular diet. She notes mild nausea with constipation. Appetite is good. No unexplained weight loss.       She reports over the last month she has noticed shortness of breath with activity or exertion along with some lightheadedness.  Patient also has had some fatigue.  No chest pain.  No alcohol use. Former smoker, stopped several years ago.   No family history of CA or GI issues.   History of Iron  deficiency, reports she was receiving IV iron  infusions with Hematology. Last IV infusion was Dreyden Rohrman. She reports her next appt is in August. Not vegan. Does not donate blood. She reports she was diagnosed with IDA few years ago. Patient had colonoscopy/EGD in Sept 2024 and full report below from [facility].   Wt Readings from Last 3 Encounters:  03/27/24 198 lb 6.6 oz (90 kg)  03/26/24 198 lb 6.6 oz (90 kg)  03/06/24 200 lb (90.7 kg)    Past Medical History:  Diagnosis Date   Allergy    seasonal   Arthritis    Cataract    Cerebral aneurysm    Chronic kidney disease    Chronic pain syndrome    Complication of anesthesia    trouble waking up after back surgery   DDD (degenerative disc disease), lumbar    Depression    DVT (deep venous thrombosis) (HCC) 2002   Fibromyalgia    GERD (gastroesophageal reflux disease)    Heart murmur    Heart dr said normal for me   History of kidney stones     Hyperlipidemia    Hypertension    Hypothyroidism    Incontinence in female    Iron  deficiency anemia 11/01/2019   Irregular heart beat    Neuromuscular disorder (HCC)    peripheral neuropathy/feet   Obesity    OSA on CPAP    Pancolitis (HCC)    Pre-diabetes    RLS (restless legs syndrome)    UTI (urinary tract infection) 11/2022    Past Surgical History:  Procedure Laterality Date   BIOPSY  06/23/2023   Procedure: BIOPSY;  Surgeon: Aundria, Ladell POUR, MD;  Location: [facility] ENDOSCOPY;  Service: Gastroenterology;;   BRAIN SURGERY  2002   aneurysm   CEREBRAL ANEURYSM REPAIR  2002   COLONOSCOPY WITH PROPOFOL  N/A 11/10/2019   Procedure: COLONOSCOPY WITH PROPOFOL ;  Surgeon: Therisa Bi, MD;  Location: [facility];  Service: Endoscopy;  Laterality: N/A;   COLONOSCOPY WITH PROPOFOL  N/A 06/23/2023   Procedure: COLONOSCOPY WITH PROPOFOL ;  Surgeon: Toledo, Ladell POUR, MD;  Location: [facility] ENDOSCOPY;  Service: Gastroenterology;  Laterality: N/A;   COSMETIC SURGERY     ESOPHAGOGASTRODUODENOSCOPY (EGD) WITH PROPOFOL  N/A 11/10/2019   Procedure: ESOPHAGOGASTRODUODENOSCOPY (EGD) WITH PROPOFOL ;  Surgeon: Therisa Bi, MD;  Location: [facility];  Service: Endoscopy;  Laterality: N/A;   ESOPHAGOGASTRODUODENOSCOPY (EGD) WITH PROPOFOL  N/A 06/23/2023   Procedure: ESOPHAGOGASTRODUODENOSCOPY (EGD) WITH PROPOFOL ;  Surgeon: Toledo, Ladell POUR, MD;  Location: [facility] ENDOSCOPY;  Service: Gastroenterology;  Laterality: N/A;   EXTRACORPOREAL SHOCK WAVE LITHOTRIPSY Right 04/27/2019   Procedure: EXTRACORPOREAL SHOCK WAVE LITHOTRIPSY (ESWL);  Surgeon: Francisca Redell BROCKS, MD;  Location: [facility] ORS;  Service: Urology;  Laterality: Right;   EYE SURGERY     JOINT REPLACEMENT Bilateral    knee   KNEE ARTHROPLASTY Left 01/12/2020   Procedure: COMPUTER ASSISTED TOTAL KNEE ARTHROPLASTY;  Surgeon: Mardee Lynwood SQUIBB, MD;  Location: [facility] ORS;  Service: Orthopedics;  Laterality: Left;   MALONEY DILATION  06/23/2023    Procedure: MALONEY DILATION;  Surgeon: Aundria, Ladell POUR, MD;  Location: [facility] ENDOSCOPY;  Service: Gastroenterology;;   POLYPECTOMY  11/10/2019   Procedure: POLYPECTOMY;  Surgeon: Therisa Bi, MD;  Location: [facility];  Service: Endoscopy;;   REVERSE SHOULDER ARTHROPLASTY Right 01/06/2022   Procedure: REVERSE SHOULDER ARTHROPLASTY;  Surgeon: Edie Norleen PARAS, MD;  Location: [facility] ORS;  Service: Orthopedics;  Laterality: Right;   SPINAL CORD STIMULATOR BATTERY EXCHANGE N/A 02/01/2023   Procedure: INTERNAL PULSE GENERATOR REPLACEMENT ([employer]);  Surgeon: Clois Fret, MD;  Location: [facility] ORS;  Service: Neurosurgery;  Laterality: N/A;  LOCAL WITH MAC   SPINAL FUSION  07/10/2019   C4-5 corpectomy with C6-7 ACDF, C2-T3 spinal fusion   SPINE SURGERY     spinal fusion   THORACIC LAMINECTOMY FOR SPINAL CORD STIMULATOR N/A 12/25/2022   Procedure: THORACIC LAMINECTOMY FOR SPINAL CORD STIMULATOR IMPLANTATION ([employer]);  Surgeon: Clois Fret, MD;  Location: [facility] ORS;  Service: Neurosurgery;  Laterality: N/A;  TUBAL LIGATION      Current Outpatient Medications  Medication Sig Dispense Refill   Alpha-Lipoic Acid 600 MG CAPS Take 600 mg by mouth 2 (two) times daily.     amoxicillin -clavulanate (AUGMENTIN ) 875-125 MG tablet Take 1 tablet by mouth every 12 (twelve) hours. 14 tablet 0   benzonatate  (TESSALON ) 200 MG capsule Take by mouth.     fluticasone  (FLONASE ) 50 MCG/ACT nasal spray Place into the nose. As needed     gabapentin  (NEURONTIN ) 400 MG capsule Take 400 mg by mouth 4 (four) times daily.     JOURNAVX 50 MG TABS SMARTSIG:Tablet(s)     Lacosamide 100 MG TABS Take 100 mg by mouth.     lisinopril  (PRINIVIL ,ZESTRIL ) 40 MG tablet Take 40 mg by mouth at bedtime.     methocarbamol  (ROBAXIN ) 500 MG tablet Take 1 tablet (500 mg total) by mouth every 6 (six) hours as needed for muscle spasms. 40 tablet 0   mirtazapine (REMERON) 15 MG tablet Take 15 mg by mouth at  bedtime.     ondansetron  (ZOFRAN -ODT) 4 MG disintegrating tablet Take 4 mg by mouth every 8 (eight) hours as needed.     pantoprazole  (PROTONIX ) 40 MG tablet Take 40 mg by mouth every morning.     senna-docusate (SENOKOT-S) 8.6-50 MG tablet Take 1 tablet by mouth daily. 20 tablet 0   topiramate (TOPAMAX) 25 MG tablet      venlafaxine  XR (EFFEXOR -XR) 150 MG 24 hr capsule Take 150 mg by mouth at bedtime. Take with 75 mg to equal 225 mg daily     venlafaxine  XR (EFFEXOR -XR) 75 MG 24 hr capsule Take 75 mg by mouth at bedtime. Take with 150 mg to equal 225 mg daily     WIXELA INHUB 100-50 MCG/ACT AEPB SMARTSIG:1 Puff(s) By Mouth Every 12 Hours     XYLITOL MT Use as directed 2 tablets in the mouth or throat daily as needed (dry mouth).     No current facility-administered medications for this visit.    Allergies as of 04/24/2024 - Review Complete 04/19/2024  Allergen Reaction Noted   Contrast media [iodinated contrast media] Hives 11/09/2018   Duloxetine Swelling 12/31/2021   Pregabalin Swelling 12/31/2021   Iodine Hives 03/31/2022   Clarithromycin Nausea Only 03/07/2011   Spironolactone-hctz Rash 12/31/2021    Family History  Problem Relation Age of Onset   Hypertension Mother    Depression Mother    Diabetes Mother    Varicose Veins Mother    Hyperlipidemia Sister    Hypertension Sister    Depression Sister    Varicose Veins Sister    Depression Brother    Arthritis Maternal Grandmother    Diabetes Maternal Grandmother    Sleep apnea Neg Hx     Review of Systems:    Constitutional: No weight loss, fever, chills, weakness or fatigue HEENT: Eyes: No change in vision               Ears, Nose, Throat:  No change in hearing or congestion Skin: No rash or itching Cardiovascular: No chest pain, chest pressure or palpitations   Respiratory: No SOB or cough Gastrointestinal: See HPI and otherwise negative Genitourinary: No dysuria or change in urinary frequency Neurological: No  headache, dizziness or syncope Musculoskeletal: No new muscle or joint pain Hematologic: No bleeding or bruising Psychiatric: No history of depression or anxiety    Physical Exam:  Vital signs: There were no vitals taken for this visit.  Constitutional:  Pleasant female appears to be in NAD, Well developed, Well nourished, alert and cooperative Throat: Oral cavity and pharynx without inflammation, swelling or lesion.  Respiratory: Respirations even and unlabored. Lungs clear to auscultation bilaterally.   No wheezes, crackles, or rhonchi.  Cardiovascular: Normal S1, S2. Regular rate and rhythm. No peripheral edema, cyanosis or pallor.  Gastrointestinal:  Soft, nondistended, nontender. No rebound or guarding. Normal bowel sounds. No appreciable masses or hepatomegaly. Rectal:  Not performed.  Msk:  uses cane Neurologic:  Alert and  oriented x4;  grossly normal neurologically.  Skin:   Dry and intact without significant lesions or rashes. Psychiatric: Oriented to person, place and time. Demonstrates good judgement and reason without abnormal affect or behaviors.  RELEVANT LABS AND IMAGING: CBC    Latest Ref Rng & Units 03/27/2024    3:34 PM 03/26/2024   12:58 PM 02/08/2024    2:02 PM  CBC  WBC 4.0 - 10.5 K/uL 6.7  7.1  6.3   Hemoglobin 12.0 - 15.0 g/dL 88.6  87.0  88.2   Hematocrit 36.0 - 46.0 % 34.9  40.0  35.9   Platelets 150 - 400 K/uL 243  255  225      CMP     Latest Ref Rng & Units 03/27/2024    3:34 PM 03/26/2024   12:58 PM 02/08/2024    2:02 PM  CMP  Glucose 70 - 99 mg/dL 868  860  86   BUN 8 - 23 mg/dL 15  19  29    Creatinine 0.44 - 1.00 mg/dL 8.76  8.82  8.75   Sodium 135 - 145 mmol/L 142  143  142   Potassium 3.5 - 5.1 mmol/L 3.6  3.5  4.2   Chloride 98 - 111 mmol/L 104  105  107   CO2 22 - 32 mmol/L 24  22  28    Calcium  8.9 - 10.3 mg/dL 9.4  9.4  9.6   Total Protein 6.5 - 8.1 g/dL 6.5  6.8  6.6   Total Bilirubin 0.0 - 1.2 mg/dL 0.4  0.5  0.3   Alkaline Phos 38 -  126 U/L 81  93  88   AST 15 - 41 U/L 21  19  16    ALT 0 - 44 U/L 12  12  8     GI procedures: 06/23/23 colonoscopy/EGD for IDA at [facility] with Dr. Aundria - Non- bleeding internal hemorrhoids. - Diverticulosis in the sigmoid colon. - One 8 mm polyp at the ileocecal valve, removed with a cold snare. Resected and retrieved Path:3. Ileocecal valve, polyp, cold snare :      TUBULAR ADENOMA.      NEGATIVE FOR HIGH-GRADE DYSPLASIA.       4. Descending Colon Polyp, cbx :      TUBULAR ADENOMA.      NEGATIVE FOR HIGH-GRADE DYSPLASIA.  06/23/23 EGD - Normal examined duodenum. - Gastritis. - 2 cm hiatal hernia. - The examination was otherwise normal. - Mucosal nodule found in the esophagus. Biopsied. - Mild Schatzki ring. Dilated. Path:1. Stomach, biopsy, cbx :       GASTRIC ANTRAL MUCOSA WITH FOCAL MILD REACTIVE EPITHELIAL CHANGES.       GASTRIC OXYNTIC MUCOSA WITHOUT SIGNIFICANT DIAGNOSTIC ALTERATION.       NO H.PYLORI IDENTIFIED ON H&E STAIN.       NEGATIVE FOR INTESTINAL METAPLASIA OR DYSPLASIA.        2. Esophagogastric junction, biopsy, nodule, cbx :  SQUAMOCOLUMNAR MUCOSA WITH INTESTINAL METAPLASIA.       NEGATIVE FOR DYSPLASIA.       SEE COMMENT.  11/2019 colonoscopy/EGD with Dr. Ruel - One 6 mm polyp in the distal ascending colon, removed with a cold snare. Resected and retrieved. - A single ( solitary) ulcer at the splenic flexure. Tattooed. - The examination was otherwise normal. Path: D. COLON POLYP, ASCENDING; COLD SNARE:  - TUBULAR ADENOMA.  - NEGATIVE FOR HIGH-GRADE DYSPLASIA AND MALIGNANCY.  EGD - Normal examined duodenum. Biopsied. - Gastritis. Biopsied. - Erythematous mucosa in the cardia. Biopsied. - Normal esophagus. - The examination was otherwise normal. Path: A. DUODENUM; COLD BIOPSY:  - ENTERIC MUCOSA WITH PRESERVED VILLOUS ARCHITECTURE AND NO SIGNIFICANT  HISTOPATHOLOGIC CHANGE.  - NEGATIVE FOR FEATURES OF CELIAC, DYSPLASIA, AND MALIGNANCY.   B. STOMACH,  CARDIA; COLD BIOPSY:  - CHRONIC ACTIVE GASTRITIS.  - INTESTINAL METAPLASIA.  - NEGATIVE FOR H. PYLORI, DYSPLASIA, AND MALIGNANCY.  C. STOMACH, ANTRUM; COLD BIOPSY: - GASTRIC ANTRAL AND OXYNTIC MUCOSA WITH FEATURES OF MILD REACTIVE GASTROPATHY. - NEGATIVE FOR H. PYLORI, DYSPLASIA, AND MALIGNANCY.   Imaging: CTAP 01/20/24  IMPRESSION: 1. No acute localizing process in the abdomen or pelvis. 2. Stable fluid collection in the lower anterior abdominal wall superficial fascia. 3. Stable hernia with disruption of the internal fascia of the left rectus musculature only. This contains nondilated small bowel. 4. Cholelithiasis. 5. Small hiatal hernia. 6. Sigmoid colon diverticulosis. 7. Stable left lower lobe pulmonary nodules measuring up to 4 mm. No follow-up needed if patient is low-risk (and has no known or suspected primary neoplasm). Non-contrast chest CT can be considered in 12 months if patient is high-risk. This recommendation follows the consensus statement: Guidelines for Management of Incidental Pulmonary Nodules Detected on CT Images: From the Fleischner Society 2017; Radiology 2017; 284:228-243.   Aortic Atherosclerosis (ICD10-I70.0).  03/26/24 CTAP IMPRESSION: 1. Findings compatible with sigmoid diverticulitis. No abscess or free air. 2. Stable left lower lobe pulmonary nodules measuring up to 4 mm. No follow-up needed if patient is low-risk.This recommendation follows the consensus statement: Guidelines for Management of Incidental Pulmonary Nodules Detected on CT Images: From the Fleischner Society 2017; Radiology 2017; 284:228-243. 3. Cholelithiasis. 4. Aortic atherosclerosis. 5. Remaining incidental findings as described above.    Assessment: Encounter Diagnoses  Name Primary?   History of diverticulitis Yes   Iron  deficiency anemia, unspecified iron  deficiency anemia type    Chronic constipation    Bloating    Flatulence    Rectal bleeding      73 year old  female patient that presents for follow-up after recent ED visit for sigmoid diverticulitis.  Patient was treated with 14 days of Augmentin .  Day following her diagnosis patient had bout of severe rectal bleeding that resolved on own.  At that time it was recommended patient be admitted however she declined.  No CTA done at that time to rule out diverticular bleed.  Colonoscopy in September 2024 revealed nonbleeding hemorrhoids, diverticular disease and 2 tubular adenomas.  Patient does have history of chronic constipation and currently not on any daily regimen.  Patient experiences a lot of abdominal discomfort and bloating when constipated.  We discussed today the importance of a good bowel regimen with high-fiber diet and starting a medication for constipation.  Patient does not tolerate oral or suppository solutions over-the-counter.  Will provide patient with Linzess 72 mcg p.o. daily.  Will also recheck her hemoglobin and iron  levels today given the episode of bleeding, Christianjames Soule require IV  iron  infusion sooner than August.  Patient does report symptoms of shortness of breath, lightheadedness, and fatigue.  Hgb 12.9>11.3>10.1  Plan: -Samples of Linzess 72mcg po daily -Recommend high fiber diet - check CBC, iron  panel/TIBC today  Thank you for the courtesy of this consult. Please call me with any questions or concerns.   Mikeisha Lemonds, FNP-C [facility] 04/25/2024 11:56

## 2024-04-24 NOTE — Progress Notes (Deleted)
 Lisa Good

## 2024-04-25 ENCOUNTER — Ambulatory Visit: Payer: Self-pay | Admitting: Gastroenterology

## 2024-04-25 ENCOUNTER — Encounter: Payer: Self-pay | Admitting: Gastroenterology

## 2024-04-25 ENCOUNTER — Ambulatory Visit: Admitting: Gastroenterology

## 2024-04-25 ENCOUNTER — Other Ambulatory Visit (INDEPENDENT_AMBULATORY_CARE_PROVIDER_SITE_OTHER)

## 2024-04-25 VITALS — BP 146/86 | HR 100 | Ht 64.0 in | Wt 196.0 lb

## 2024-04-25 DIAGNOSIS — R140 Abdominal distension (gaseous): Secondary | ICD-10-CM

## 2024-04-25 DIAGNOSIS — K5909 Other constipation: Secondary | ICD-10-CM

## 2024-04-25 DIAGNOSIS — K625 Hemorrhage of anus and rectum: Secondary | ICD-10-CM

## 2024-04-25 DIAGNOSIS — Z8719 Personal history of other diseases of the digestive system: Secondary | ICD-10-CM

## 2024-04-25 DIAGNOSIS — D509 Iron deficiency anemia, unspecified: Secondary | ICD-10-CM

## 2024-04-25 DIAGNOSIS — R143 Flatulence: Secondary | ICD-10-CM

## 2024-04-25 LAB — IBC + FERRITIN
Ferritin: 27.5 ng/mL (ref 10.0–291.0)
Iron: 38 ug/dL — ABNORMAL LOW (ref 42–145)
Saturation Ratios: 10.4 % — ABNORMAL LOW (ref 20.0–50.0)
TIBC: 366.8 ug/dL (ref 250.0–450.0)
Transferrin: 262 mg/dL (ref 212.0–360.0)

## 2024-04-25 LAB — CBC WITH DIFFERENTIAL/PLATELET
Basophils Absolute: 0.1 K/uL (ref 0.0–0.1)
Basophils Relative: 0.9 % (ref 0.0–3.0)
Eosinophils Absolute: 0.2 K/uL (ref 0.0–0.7)
Eosinophils Relative: 3.4 % (ref 0.0–5.0)
HCT: 33.9 % — ABNORMAL LOW (ref 36.0–46.0)
Hemoglobin: 10.9 g/dL — ABNORMAL LOW (ref 12.0–15.0)
Lymphocytes Relative: 22.1 % (ref 12.0–46.0)
Lymphs Abs: 1.2 K/uL (ref 0.7–4.0)
MCHC: 32.3 g/dL (ref 30.0–36.0)
MCV: 86 fl (ref 78.0–100.0)
Monocytes Absolute: 0.3 K/uL (ref 0.1–1.0)
Monocytes Relative: 6.4 % (ref 3.0–12.0)
Neutro Abs: 3.6 K/uL (ref 1.4–7.7)
Neutrophils Relative %: 67.2 % (ref 43.0–77.0)
Platelets: 301 K/uL (ref 150.0–400.0)
RBC: 3.94 Mil/uL (ref 3.87–5.11)
RDW: 16.3 % — ABNORMAL HIGH (ref 11.5–15.5)
WBC: 5.4 K/uL (ref 4.0–10.5)

## 2024-04-25 NOTE — Patient Instructions (Addendum)
 Constipation Recommend high fiber diet Samples of Linzess 1 tablet 30-45 minutes before first meal of the day with glass of water .   Your provider has requested that you go to the basement level for lab work before leaving today. Press B on the elevator. The lab is located at the first door on the left as you exit the elevator.  _______________________________________________________  If your blood pressure at your visit was 140/90 or greater, please contact your primary care physician to follow up on this.  _______________________________________________________  If you are age 71 or older, your body mass index should be between 23-30. Your Body mass index is 33.64 kg/m. If this is out of the aforementioned range listed, please consider follow up with your Primary Care Provider.  If you are age 82 or younger, your body mass index should be between 19-25. Your Body mass index is 33.64 kg/m. If this is out of the aformentioned range listed, please consider follow up with your Primary Care Provider.   ________________________________________________________  The [facility] providers would like to encourage you to use MYCHART to communicate with providers for non-urgent requests or questions.  Due to long hold times on the telephone, sending your provider a message by [facility] may be a faster and more efficient way to get a response.  Please allow 48 business hours for a response.  Please remember that this is for non-urgent requests.  _______________________________________________________  [facility] is using a team-based approach to care.  Your team is made up of your doctor and two to three APPS. Our APPS (Nurse Practitioners and Physician Assistants) work with your physician to ensure care continuity for you. They are fully qualified to address your health concerns and develop a treatment plan. They communicate directly with your gastroenterologist to care for you. Seeing the  Advanced Practice Practitioners on your physician's team can help you by facilitating care more promptly, often allowing for earlier appointments, access to diagnostic testing, procedures, and other specialty referrals.   Thank you for trusting me with your gastrointestinal care. Deanna May, NP-C

## 2024-04-26 ENCOUNTER — Telehealth: Payer: Self-pay | Admitting: *Deleted

## 2024-04-26 NOTE — Telephone Encounter (Signed)
 Received a call from patient stating that she had labwork done at her GI office yesterday and they suggested she may need iron .  Labs presented to [facility].  3 doses of iron  ordered per Lauraine Dais PA.  Scheduling message sent.  VM placed on patient personal cell phone

## 2024-05-02 ENCOUNTER — Inpatient Hospital Stay: Attending: Medical Oncology

## 2024-05-02 ENCOUNTER — Other Ambulatory Visit: Payer: Self-pay | Admitting: Medical Oncology

## 2024-05-02 VITALS — BP 145/81 | HR 90 | Temp 98.6°F | Resp 18

## 2024-05-02 DIAGNOSIS — D500 Iron deficiency anemia secondary to blood loss (chronic): Secondary | ICD-10-CM | POA: Diagnosis present

## 2024-05-02 DIAGNOSIS — K922 Gastrointestinal hemorrhage, unspecified: Secondary | ICD-10-CM | POA: Diagnosis present

## 2024-05-02 MED ORDER — SODIUM CHLORIDE 0.9 % IV SOLN: Freq: Once | INTRAVENOUS | Status: AC

## 2024-05-02 MED ORDER — SODIUM CHLORIDE 0.9 % IV SOLN
300.0000 mg | Freq: Once | INTRAVENOUS | Status: AC
  Administered 2024-05-02: 300 mg via INTRAVENOUS
  Filled 2024-05-02: qty 300

## 2024-05-02 NOTE — Patient Instructions (Signed)

## 2024-05-02 NOTE — Progress Notes (Signed)
 Patient did not want to wait for 30 min observation.  VSS.  PAtient denies symptoms.  Feels very good

## 2024-05-09 ENCOUNTER — Inpatient Hospital Stay: Attending: Medical Oncology

## 2024-05-09 VITALS — BP 148/69 | HR 91 | Temp 98.0°F | Resp 18

## 2024-05-09 DIAGNOSIS — I129 Hypertensive chronic kidney disease with stage 1 through stage 4 chronic kidney disease, or unspecified chronic kidney disease: Secondary | ICD-10-CM | POA: Insufficient documentation

## 2024-05-09 DIAGNOSIS — K922 Gastrointestinal hemorrhage, unspecified: Secondary | ICD-10-CM | POA: Insufficient documentation

## 2024-05-09 DIAGNOSIS — D500 Iron deficiency anemia secondary to blood loss (chronic): Secondary | ICD-10-CM | POA: Diagnosis present

## 2024-05-09 DIAGNOSIS — N183 Chronic kidney disease, stage 3 unspecified: Secondary | ICD-10-CM | POA: Insufficient documentation

## 2024-05-09 MED ORDER — SODIUM CHLORIDE 0.9 % IV SOLN: Freq: Once | INTRAVENOUS | Status: AC

## 2024-05-09 MED ORDER — SODIUM CHLORIDE 0.9 % IV SOLN
300.0000 mg | Freq: Once | INTRAVENOUS | Status: AC
  Administered 2024-05-09: 300 mg via INTRAVENOUS
  Filled 2024-05-09: qty 300

## 2024-05-09 NOTE — Patient Instructions (Signed)

## 2024-05-10 ENCOUNTER — Ambulatory Visit: Admitting: Medical Oncology

## 2024-05-10 ENCOUNTER — Inpatient Hospital Stay

## 2024-05-16 ENCOUNTER — Other Ambulatory Visit: Payer: Self-pay | Admitting: Family

## 2024-05-16 ENCOUNTER — Inpatient Hospital Stay

## 2024-05-16 DIAGNOSIS — D500 Iron deficiency anemia secondary to blood loss (chronic): Secondary | ICD-10-CM

## 2024-05-16 MED ORDER — SODIUM CHLORIDE 0.9 % IV SOLN: INTRAVENOUS | Status: DC

## 2024-05-16 MED ORDER — SODIUM CHLORIDE 0.9 % IV SOLN
300.0000 mg | Freq: Once | INTRAVENOUS | Status: AC
  Administered 2024-05-16 (×2): 300 mg via INTRAVENOUS
  Filled 2024-05-16: qty 300

## 2024-05-16 NOTE — Progress Notes (Signed)
 1400-Pt refused to stay for 30 minutes post iron  infusion and is without complaints at time of discharge.

## 2024-05-17 NOTE — Telephone Encounter (Signed)
 Got her scheduled

## 2024-05-18 ENCOUNTER — Ambulatory Visit: Admitting: Urology

## 2024-05-18 ENCOUNTER — Encounter: Payer: Self-pay | Admitting: Urology

## 2024-05-18 VITALS — BP 179/87 | HR 109 | Temp 100.4°F | Ht <= 58 in | Wt <= 1120 oz

## 2024-05-18 DIAGNOSIS — R351 Nocturia: Secondary | ICD-10-CM

## 2024-05-18 DIAGNOSIS — N3946 Mixed incontinence: Secondary | ICD-10-CM

## 2024-05-18 DIAGNOSIS — N3941 Urge incontinence: Secondary | ICD-10-CM

## 2024-05-18 LAB — URINALYSIS, COMPLETE
Bilirubin, UA: NEGATIVE
Glucose, UA: NEGATIVE
Nitrite, UA: NEGATIVE
RBC, UA: NEGATIVE
Specific Gravity, UA: 1.03 (ref 1.005–1.030)
Urobilinogen, Ur: 0.2 mg/dL (ref 0.2–1.0)
pH, UA: 5.5 (ref 5.0–7.5)

## 2024-05-18 LAB — MICROSCOPIC EXAMINATION: Epithelial Cells (non renal): >10 /HPF — AB (ref 0–10)

## 2024-05-18 LAB — BLADDER SCAN AMB NON-IMAGING

## 2024-05-18 NOTE — Progress Notes (Signed)
 05/18/2024 11:23 AM   Lisa Good 02-07-1951 [id number]  Referring provider: Marikay Eva POUR, PA [address] [facility] [place],  [place] [address]  Urological history: 1.  Mixed urinary incontinence - undergone 2 urethral slings, as well as an autologous fascial sling in [place]  - failed OAB  - Gemtesa  effective, but cost prohibitive - failed Botox   - Maintenance PTNS - Deferred InterStim  2. rUTI's -trimethoprim  100 mg daily   3.  Nephrolithiasis - right ESWL (2020)   Chief Complaint  Patient presents with   Mixed Incontinence   HPI: Lisa Good is a 73 y.o. woman who presents today for still having symptoms after completing antibiotics.   Previous records reviewed.   When she presented to a monthly maintenance PTNS on April 19, 2024, she informed the front desk staff that she felt she was having symptoms of UTI with frequency and urgency.  Her urinalysis was positive for leukocytes.  Her urine culture grew out mixed urogenital flora.    She reached out via MyChart on May 17, 2024 stating she was having trouble with frequency and urgency and not feeling well at all.   She has had 3 noncontrast CT scans this year which have not had any nephrolithiasis or ureteral stones, hydronephrosis or renal masses.  UA positive for protein, ketones, leukocytes, squames, calcium  oxalate crystals and moderate bacteria  She has an increase in her nocturia over the last month.  She is getting up 6 times nightly.  She did think it was urinary tract infection, but we ruled that out with negative culture.  She takes several medications at night.  She also has sleep apnea and is not sleeping with her CPAP machine as she cannot find a mask that fits.  PMH: Past Medical History:  Diagnosis Date   Allergy    seasonal   Arthritis    Cataract    Cerebral aneurysm    Chronic kidney disease    Chronic pain syndrome    Complication of anesthesia     trouble waking up after back surgery   DDD (degenerative disc disease), lumbar    Depression    DVT (deep venous thrombosis) (HCC) 2002   Fibromyalgia    GERD (gastroesophageal reflux disease)    Heart murmur    Heart dr said normal for me   History of kidney stones    Hyperlipidemia    Hypertension    Hypothyroidism    Incontinence in female    Iron  deficiency anemia 11/01/2019   Irregular heart beat    Neuromuscular disorder (HCC)    peripheral neuropathy/feet   Obesity    OSA on CPAP    Pancolitis (HCC)    Pre-diabetes    RLS (restless legs syndrome)    UTI (urinary tract infection) 11/2022    Surgical History: Past Surgical History:  Procedure Laterality Date   BIOPSY  06/23/2023   Procedure: BIOPSY;  Surgeon: Aundria, Ladell POUR, MD;  Location: [facility] END[facility];  Service: Gastroenterology;;   BRAIN SURGERY  2002   aneurysm   CEREBRAL ANEURYSM REPAIR  2002   COLONOSCOPY WITH PROPOFOL  N/A 11/10/2019   Procedure: COLONOSCOPY WITH PROPOFOL ;  Surgeon: Therisa Bi, MD;  Location: [facility];  Service: Endoscopy;  Laterality: N/A;   COLONOSCOPY WITH PROPOFOL  N/A 06/23/2023   Procedure: COLONOSCOPY WITH PROPOFOL ;  Surgeon: Toledo, Ladell POUR, MD;  Location: [facility] ENDOSCOPY;  Service: Gastroenterology;  Laterality: N/A;   COSMETIC SURGERY  ESOPHAGOGASTRODUODENOSCOPY (EGD) WITH PROPOFOL  N/A 11/10/2019   Procedure: ESOPHAGOGASTRODUODENOSCOPY (EGD) WITH PROPOFOL ;  Surgeon: Therisa Bi, MD;  Location: [facility];  Service: Endoscopy;  Laterality: N/A;   ESOPHAGOGASTRODUODENOSCOPY (EGD) WITH PROPOFOL  N/A 06/23/2023   Procedure: ESOPHAGOGASTRODUODENOSCOPY (EGD) WITH PROPOFOL ;  Surgeon: Toledo, Ladell POUR, MD;  Location: [facility];  Service: Gastroenterology;  Laterality: N/A;   EXTRACORPOREAL SHOCK WAVE LITHOTRIPSY Right 04/27/2019   Procedure: EXTRACORPOREAL SHOCK WAVE LITHOTRIPSY (ESWL);  Surgeon: Francisca Redell BROCKS, MD;  Location: [facility] ORS;  Service: Urology;   Laterality: Right;   EYE SURGERY     JOINT REPLACEMENT Bilateral    knee   KNEE ARTHROPLASTY Left 01/12/2020   Procedure: COMPUTER ASSISTED TOTAL KNEE ARTHROPLASTY;  Surgeon: Mardee Lynwood SQUIBB, MD;  Location: [facility] OR[facility];  Service: Orthopedics;  Laterality: Left;   MALONEY DILATION  06/23/2023   Procedure: MALONEY DILATION;  Surgeon: Aundria, Ladell POUR, MD;  Location: [facility] END[facility];  Service: Gastroenterology;;   POLYPECTOMY  11/10/2019   Procedure: POLYPECTOMY;  Surgeon: Therisa Bi, MD;  Location: [facility];  Service: Endoscopy;;   REVERSE SHOULDER ARTHROPLASTY Right 01/06/2022   Procedure: REVERSE SHOULDER ARTHROPLASTY;  Surgeon: Edie Norleen PARAS, MD;  Location: [facility] OR[facility];  Service: Orthopedics;  Laterality: Right;   SPINAL CORD STIMULATOR BATTERY EXCHANGE N/A 02/01/2023   Procedure: INTERNAL PULSE GENERATOR REPLACEMENT ([employer]);  Surgeon: Clois Fret, MD;  Location: [facility] OR[facility];  Service: Neurosurgery;  Laterality: N/A;  LOCAL WITH MAC   SPINAL FUSION  07/10/2019   C4-5 corpectomy with C6-7 ACDF, C2-T3 spinal fusion   SPINE SURGERY     spinal fusion   THORACIC LAMINECTOMY FOR SPINAL CORD STIMULATOR N/A 12/25/2022   Procedure: THORACIC LAMINECTOMY FOR SPINAL CORD STIMULATOR IMPLANTATION ([employer]);  Surgeon: Clois Fret, MD;  Location: [facility] ORS;  Service: Neurosurgery;  Laterality: N/A;   TUBAL LIGATION      Home Medications:  Allergies as of 05/18/2024       Reactions   Contrast Media [iodinated Contrast Media] Hives   Duloxetine Swelling   Legs and feet   Pregabalin Swelling   Legs and feet   Iodine Hives   Clarithromycin Nausea Only   Spironolactone-hctz Rash   Only with iv injection        Medication List        Accurate as of May 18, 2024 11:23 AM. If you have any questions, ask your nurse or doctor.          STOP taking these medications    Journavx 50 MG Tabs Generic drug: Suzetrigine Stopped by: CLOTILDA CORNWALL    Lacosamide 100 MG Tabs Stopped by: Dajanae Brophy   methocarbamol  500 MG tablet Commonly known as: ROBAXIN  Stopped by: Mellony Danziger   mirtazapine 15 MG tablet Commonly known as: REMERON Stopped by: Marykatherine Sherwood   ondansetron  4 MG disintegrating tablet Commonly known as: ZOFRAN -ODT Stopped by: Darnel Mchan   senna-docusate 8.6-50 MG tablet Commonly known as: Senokot-S Stopped by: Yaseen Gilberg   topiramate 25 MG tablet Commonly known as: TOPAMAX Stopped by: Anuar Walgren       TAKE these medications    Alpha-Lipoic Acid 600 MG Caps Take 600 mg by mouth 2 (two) times daily.   amoxicillin -clavulanate 875-125 MG tablet Commonly known as: AUGMENTIN  Take 1 tablet by mouth every 12 (twelve) hours.   fluticasone  50 MCG/ACT nasal spray Commonly known as: FLONASE  Place into the nose. As needed   gabapentin  800 MG tablet Commonly known as: NEURONTIN  Take 800 mg by  mouth 4 (four) times daily.   lisinopril  40 MG tablet Commonly known as: ZESTRIL  Take 40 mg by mouth at bedtime.   pantoprazole  40 MG tablet Commonly known as: PROTONIX  Take 40 mg by mouth every morning.   venlafaxine  XR 150 MG 24 hr capsule Commonly known as: EFFEXOR -XR Take 150 mg by mouth at bedtime. Take with 75 mg to equal 225 mg daily   venlafaxine  XR 75 MG 24 hr capsule Commonly known as: EFFEXOR -XR Take 75 mg by mouth at bedtime. Take with 150 mg to equal 225 mg daily   Wixela Inhub 100-50 MCG/ACT Aepb Generic drug: fluticasone -salmeterol SMARTSIG:1 Puff(s) By Mouth Every 12 Hours   XYLITOL MT Use as directed 2 tablets in the mouth or throat daily as needed (dry mouth).        Allergies:  Allergies  Allergen Reactions   Contrast Media [Iodinated Contrast Media] Hives   Duloxetine Swelling    Legs and feet   Pregabalin Swelling    Legs and feet   Iodine Hives   Clarithromycin Nausea Only   Spironolactone-Hctz Rash    Only with iv injection    Family  History: Family History  Problem Relation Age of Onset   Hypertension Mother    Depression Mother    Diabetes Mother    Varicose Veins Mother    Hyperlipidemia Sister    Hypertension Sister    Depression Sister    Varicose Veins Sister    Depression Brother    Arthritis Maternal Grandmother    Diabetes Maternal Grandmother    Sleep apnea Neg Hx     Social History: See HPI for pertinent social history  ROS: Pertinent ROS in HPI  Physical Exam: BP (!) 179/87 (BP Location: Left Arm, Patient Position: Sitting, Cuff Size: Large)   Pulse (!) 109   Constitutional:  Well nourished. Alert and oriented, No acute distress. HEENT: [place] AT, moist mucus membranes.  Trachea midline Cardiovascular: No clubbing, cyanosis, or edema. Respiratory: Normal respiratory effort, no increased work of breathing. Neurologic: Grossly intact, no focal deficits, moving all 4 extremities. Psychiatric: Normal mood and affect.  Laboratory Data: See EPIC and HPI  I have reviewed the labs.   Pertinent Imaging:  05/18/24 11:06  Scan Result 15ml   Assessment & Plan:    1. Mixed incontinence (Primary) - managed with PTNS - now on monthly schedule  2. Urgency incontinence - see above  3. Nocturia -We discussed that nocturia is caused by many different factors, she does not have signs of UTI, she is emptying her bladder and her previous CT did not show worrisome GU pathology, so I do not see a urological reason for her nocturia at this time.  I encouraged her to reach out to her other providers to make sure none of her nighttime medications are causing her to get up at night, I also encouraged her to start sleeping with her CPAP machine and I explained to her sleep apnea contributes to nocturia and also puts her at risk for sudden death, heart arrhythmias, and stroke, she stated that she would reach out to her PCP as she feels she probably needs another sleep study anyway and see if she can contact her  insurance company on who she needs to speak with regarding CPAP machine supplies   Return for As scheduled.  These notes generated with voice recognition software. I apologize for typographical errors.  CLOTILDA HELON RIGGERS  [facility] [address]  [address] [place], [place] [address] [phone]

## 2024-05-23 ENCOUNTER — Ambulatory Visit: Admitting: Urology

## 2024-05-23 ENCOUNTER — Ambulatory Visit: Admitting: Physician Assistant

## 2024-05-23 VITALS — BP 194/105 | HR 112

## 2024-05-23 DIAGNOSIS — N3941 Urge incontinence: Secondary | ICD-10-CM

## 2024-05-23 DIAGNOSIS — N3946 Mixed incontinence: Secondary | ICD-10-CM | POA: Diagnosis not present

## 2024-05-23 LAB — CULTURE, URINE COMPREHENSIVE

## 2024-05-23 NOTE — Progress Notes (Signed)
 PTNS  Session # 17  Health & Social Factors: no change Caffeine: 2 Alcohol: 0 Daytime voids #per day: 4 Night-time voids #per night: 4 Urgency: Strong  Incontinence Episodes #per day: 3 Ankle used: right Treatment Setting: 6 Feeling/ Response: both Comments: patient tolerated well  Performed By: Beauford Browner, CCMA  Follow Up: PTNS Session 18 Scheduled

## 2024-05-30 ENCOUNTER — Other Ambulatory Visit: Payer: Self-pay | Admitting: Medical Oncology

## 2024-05-30 DIAGNOSIS — E538 Deficiency of other specified B group vitamins: Secondary | ICD-10-CM

## 2024-05-30 DIAGNOSIS — D500 Iron deficiency anemia secondary to blood loss (chronic): Secondary | ICD-10-CM

## 2024-05-31 ENCOUNTER — Inpatient Hospital Stay

## 2024-05-31 ENCOUNTER — Inpatient Hospital Stay (HOSPITAL_BASED_OUTPATIENT_CLINIC_OR_DEPARTMENT_OTHER): Admitting: Medical Oncology

## 2024-05-31 ENCOUNTER — Other Ambulatory Visit: Payer: Self-pay | Admitting: Medical Oncology

## 2024-05-31 VITALS — BP 155/101 | HR 102 | Temp 99.2°F | Resp 17

## 2024-05-31 DIAGNOSIS — D649 Anemia, unspecified: Secondary | ICD-10-CM | POA: Diagnosis not present

## 2024-05-31 DIAGNOSIS — Z8719 Personal history of other diseases of the digestive system: Secondary | ICD-10-CM

## 2024-05-31 DIAGNOSIS — D500 Iron deficiency anemia secondary to blood loss (chronic): Secondary | ICD-10-CM

## 2024-05-31 DIAGNOSIS — E538 Deficiency of other specified B group vitamins: Secondary | ICD-10-CM | POA: Diagnosis not present

## 2024-05-31 LAB — CMP (CANCER CENTER ONLY)
ALT: 9 U/L (ref 0–44)
AST: 23 U/L (ref 15–41)
Albumin: 3.8 g/dL (ref 3.5–5.0)
Alkaline Phosphatase: 104 U/L (ref 38–126)
Anion gap: 12 (ref 5–15)
BUN: 18 mg/dL (ref 8–23)
CO2: 24 mmol/L (ref 22–32)
Calcium: 9.3 mg/dL (ref 8.9–10.3)
Chloride: 109 mmol/L (ref 98–111)
Creatinine: 0.97 mg/dL (ref 0.44–1.00)
GFR, Estimated: >60 mL/min (ref >60)
Glucose, Bld: 112 mg/dL — ABNORMAL HIGH (ref 70–99)
Potassium: 4.2 mmol/L (ref 3.5–5.1)
Sodium: 144 mmol/L (ref 135–145)
Total Bilirubin: 0.2 mg/dL (ref 0.0–1.2)
Total Protein: 6.4 g/dL — ABNORMAL LOW (ref 6.5–8.1)

## 2024-05-31 LAB — CBC WITH DIFFERENTIAL (CANCER CENTER ONLY)
Abs Immature Granulocytes: 0.02 K/uL (ref 0.00–0.07)
Basophils Absolute: 0 K/uL (ref 0.0–0.1)
Basophils Relative: 1 %
Eosinophils Absolute: 0.2 K/uL (ref 0.0–0.5)
Eosinophils Relative: 5 %
HCT: 38.7 % (ref 36.0–46.0)
Hemoglobin: 12.2 g/dL (ref 12.0–15.0)
Immature Granulocytes: 0 %
Lymphocytes Relative: 24 %
Lymphs Abs: 1.1 K/uL (ref 0.7–4.0)
MCH: 28.2 pg (ref 26.0–34.0)
MCHC: 31.5 g/dL (ref 30.0–36.0)
MCV: 89.6 fL (ref 80.0–100.0)
Monocytes Absolute: 0.3 K/uL (ref 0.1–1.0)
Monocytes Relative: 7 %
Neutro Abs: 2.9 K/uL (ref 1.7–7.7)
Neutrophils Relative %: 63 %
Platelet Count: 210 K/uL (ref 150–400)
RBC: 4.32 MIL/uL (ref 3.87–5.11)
RDW: 15.6 % — ABNORMAL HIGH (ref 11.5–15.5)
WBC Count: 4.7 K/uL (ref 4.0–10.5)
nRBC: 0 % (ref 0.0–0.2)

## 2024-05-31 LAB — IRON AND IRON BINDING CAPACITY (CC-WL,HP ONLY)
Iron: 49 ug/dL (ref 28–170)
Saturation Ratios: 16 % (ref 10.4–31.8)
TIBC: 309 ug/dL (ref 250–450)
UIBC: 260 ug/dL

## 2024-05-31 LAB — VITAMIN B12: Vitamin B-12: 383 pg/mL (ref 180–914)

## 2024-05-31 LAB — FERRITIN: Ferritin: 144 ng/mL (ref 11–307)

## 2024-05-31 NOTE — Progress Notes (Signed)
 Hematology and Oncology Follow Up Visit  Audi Smyser [id number] [birth year] 73 y.o. 05/31/2024  Past Medical History:  Diagnosis Date   Allergy    seasonal   Arthritis    Cataract    Cerebral aneurysm    Chronic kidney disease    Chronic pain syndrome    Complication of anesthesia    trouble waking up after back surgery   DDD (degenerative disc disease), lumbar    Depression    DVT (deep venous thrombosis) (HCC) 2002   Fibromyalgia    GERD (gastroesophageal reflux disease)    Heart murmur    Heart dr said normal for me   History of kidney stones    Hyperlipidemia    Hypertension    Hypothyroidism    Incontinence in female    Iron  deficiency anemia 11/01/2019   Irregular heart beat    Neuromuscular disorder (HCC)    peripheral neuropathy/feet   Obesity    OSA on CPAP    Pancolitis (HCC)    Pre-diabetes    RLS (restless legs syndrome)    UTI (urinary tract infection) 11/2022    Principle Diagnosis:  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 CKD  Current Therapy:   Venofer  300 mg - last infusion 05/16/2024  Prior Work Up:  She had an endoscopy/colonoscopy on 06/13/2023. Two polyps were collected that were benign. No bleeding was found.     Interim History:  Ms. Cuoco is back for follow-up for her IDA. She was first seen by our office on 05/13/2023. Labs from 03/29/2023 show an iron  of 19, iron  saturation of 4%, ferritin of 7 on 03/18/2023. Last CBC on 03/30/2023 showed a hemoglobin of 8.2, MCV of 80.6. She was started on Venofer  300 mg weekly x 3 weeks total. She is s/p Venofer  on 05/20/2023, 05/27/2023, 06/17/2023. She is here today for repeat labs and consideration of additional Venofer . She has tolerated the Venofer  well.    Today she states that she has been ok- she is flustered that she was running behind today and lost her phone.   At her last visit she was having some GI distress and bleeding from the stress from losing her mother. She reports that things have been  better.   There has been no bleeding to her knowledge: denies epistaxis, gingivitis, hemoptysis, hematemesis, hematuria, melena, excessive bruising, blood donation.    Appetite is good    Wt Readings from Last 3 Encounters:  04/25/24 196 lb (88.9 kg)  03/27/24 198 lb 6.6 oz (90 kg)  03/26/24 198 lb 6.6 oz (90 kg)     Medications:   Current Outpatient Medications:    Alpha-Lipoic Acid 600 MG CAPS, Take 600 mg by mouth 2 (two) times daily., Disp: , Rfl:    celecoxib  (CELEBREX ) 200 MG capsule, Take 200 mg by mouth daily., Disp: , Rfl:    gabapentin  (NEURONTIN ) 800 MG tablet, Take 800 mg by mouth 4 (four) times daily., Disp: , Rfl:    lisinopril  (PRINIVIL ,ZESTRIL ) 40 MG tablet, Take 40 mg by mouth at bedtime., Disp: , Rfl:    oxyCODONE -acetaminophen  (PERCOCET) 10-325 MG tablet, Take 1 tablet by mouth 4 (four) times daily as needed., Disp: , Rfl:    pantoprazole  (PROTONIX ) 40 MG tablet, Take 40 mg by mouth every morning., Disp: , Rfl:    venlafaxine  XR (EFFEXOR -XR) 150 MG 24 hr capsule, Take 150 mg by mouth at bedtime. Take with 75 mg to equal 225 mg daily, Disp: , Rfl:    venlafaxine  XR (  EFFEXOR -XR) 75 MG 24 hr capsule, Take 75 mg by mouth at bedtime. Take with 150 mg to equal 225 mg daily, Disp: , Rfl:    XYLITOL MT, Use as directed 2 tablets in the mouth or throat daily as needed (dry mouth)., Disp: , Rfl:    WIXELA INHUB 100-50 MCG/ACT AEPB, SMARTSIG:1 Puff(s) By Mouth Every 12 Hours (Patient not taking: Reported on 05/31/2024), Disp: , Rfl:   Allergies:  Allergies  Allergen Reactions   Contrast Media [Iodinated Contrast Media] Hives   Duloxetine Swelling    Legs and feet   Pregabalin Swelling    Legs and feet   Iodine Hives   Clarithromycin Nausea Only   Spironolactone-Hctz Rash    Only with iv injection    Past Medical History, Surgical history, Social history, and Family History were reviewed and updated.  Review of Systems: As stated above in HPI  Physical Exam:  oral  temperature is 99.2 F (37.3 C). Her blood pressure is 155/101 (abnormal) and her pulse is 102 (abnormal). Her respiration is 17 and oxygen saturation is 95%.   Physical Exam General: NAD. Uses a cane to ambulate  Cardiovascular: regular rate and rhythm Pulmonary: clear ant fields Abdomen: soft, nontender, + bowel sounds GU: no suprapubic tenderness Extremities: no edema, no joint deformities Skin: no rashes Neurological: Weakness but otherwise nonfocal   Lab Results  Component Value Date   WBC 4.7 05/31/2024   HGB 12.2 05/31/2024   HCT 38.7 05/31/2024   MCV 89.6 05/31/2024   PLT 210 05/31/2024     Chemistry      Component Value Date/Time   NA 142 03/27/2024 1534   K 3.6 03/27/2024 1534   CL 104 03/27/2024 1534   CO2 24 03/27/2024 1534   BUN 15 03/27/2024 1534   CREATININE 1.23 (H) 03/27/2024 1534   CREATININE 1.24 (H) 02/08/2024 1402      Component Value Date/Time   CALCIUM  9.4 03/27/2024 1534   ALKPHOS 81 03/27/2024 1534   AST 21 03/27/2024 1534   AST 16 02/08/2024 1402   ALT 12 03/27/2024 1534   ALT 8 02/08/2024 1402   BILITOT 0.4 03/27/2024 1534   BILITOT 0.3 02/08/2024 1402     Encounter Diagnoses  Name Primary?   Iron  deficiency anemia due to chronic blood loss Yes   B12 deficiency    History of GI bleed    Anemia, unspecified type     Assessment and Plan- Patient is a 73 y.o. 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 UTD with Colo/endo. Currently s/p Venofer  300 mg.   Hgb down to 12.2 today.  MCV is 89.3- question nutrient deficiency -B12/folate/iron  pending. Will supplement if needed. CMP pending Iron /B12 pending    Disposition: RTC 3 months me, labs (CBC w/, CMP, iron , ferritin, retic,b12) -[place]   Lauraine Dais PA-C 8/27/20253:45 PM

## 2024-06-01 ENCOUNTER — Ambulatory Visit: Payer: Self-pay | Admitting: Medical Oncology

## 2024-06-07 ENCOUNTER — Other Ambulatory Visit: Payer: Self-pay | Admitting: Physician Assistant

## 2024-06-07 DIAGNOSIS — J019 Acute sinusitis, unspecified: Secondary | ICD-10-CM

## 2024-06-15 ENCOUNTER — Ambulatory Visit: Admitting: Gastroenterology

## 2024-06-19 ENCOUNTER — Ambulatory Visit
Admission: RE | Admit: 2024-06-19 | Discharge: 2024-06-19 | Disposition: A | Discharge status: Home / Self Care | Source: Ambulatory Visit | Attending: Physician Assistant | Admitting: Physician Assistant

## 2024-06-19 DIAGNOSIS — J019 Acute sinusitis, unspecified: Secondary | ICD-10-CM | POA: Diagnosis present

## 2024-07-11 NOTE — Telephone Encounter (Signed)
 Patient called due to she mixed up the cl's and would like to get another set to get by to make sure vision good.  She will pick up at the front desk today. Given ultra toric MF both eyes same as Cl's RX.

## 2024-07-13 ENCOUNTER — Other Ambulatory Visit

## 2024-07-13 ENCOUNTER — Ambulatory Visit (INDEPENDENT_AMBULATORY_CARE_PROVIDER_SITE_OTHER)
Admission: RE | Admit: 2024-07-13 | Discharge: 2024-07-13 | Disposition: A | Discharge status: Home / Self Care | Source: Ambulatory Visit | Attending: Gastroenterology | Admitting: Gastroenterology

## 2024-07-13 ENCOUNTER — Encounter: Payer: Self-pay | Admitting: Gastroenterology

## 2024-07-13 ENCOUNTER — Ambulatory Visit: Admitting: Gastroenterology

## 2024-07-13 VITALS — BP 124/70 | HR 100 | Ht 64.5 in | Wt 218.0 lb

## 2024-07-13 DIAGNOSIS — K625 Hemorrhage of anus and rectum: Secondary | ICD-10-CM | POA: Diagnosis not present

## 2024-07-13 DIAGNOSIS — K5900 Constipation, unspecified: Secondary | ICD-10-CM | POA: Diagnosis not present

## 2024-07-13 DIAGNOSIS — Z79899 Other long term (current) drug therapy: Secondary | ICD-10-CM

## 2024-07-13 LAB — CBC WITH DIFFERENTIAL/PLATELET
Basophils Absolute: 0.1 K/uL (ref 0.0–0.1)
Basophils Relative: 1 % (ref 0.0–3.0)
Eosinophils Absolute: 0.3 K/uL (ref 0.0–0.7)
Eosinophils Relative: 3.8 % (ref 0.0–5.0)
HCT: 41 % (ref 36.0–46.0)
Hemoglobin: 13.3 g/dL (ref 12.0–15.0)
Lymphocytes Relative: 20.4 % (ref 12.0–46.0)
Lymphs Abs: 1.8 K/uL (ref 0.7–4.0)
MCHC: 32.4 g/dL (ref 30.0–36.0)
MCV: 86.5 fl (ref 78.0–100.0)
Monocytes Absolute: 0.5 K/uL (ref 0.1–1.0)
Monocytes Relative: 5.8 % (ref 3.0–12.0)
Neutro Abs: 6.1 K/uL (ref 1.4–7.7)
Neutrophils Relative %: 69 % (ref 43.0–77.0)
Platelets: 301 K/uL (ref 150.0–400.0)
RBC: 4.74 Mil/uL (ref 3.87–5.11)
RDW: 16.9 % — ABNORMAL HIGH (ref 11.5–15.5)
WBC: 8.9 K/uL (ref 4.0–10.5)

## 2024-07-13 NOTE — Progress Notes (Signed)
 Agree with the assessment and plan as outlined by [facility], FNP-C.   Last colonoscopy was 06/2023 at [facility] and notable for internal hemorrhoids, sigmoid diverticulosis, 8 mm adenoma resected from the IC valve.  Prior to that, colonoscopy 11/2019 with 6 mm distal ascending colon adenoma and single ulcer at the splenic flexure (tattooed).  Based on these results and per current guidelines, would not be due for repeat colonoscopy for 7 years (2031), at which time she would be age 73.  Plan for open conversation with patient at that time given age and would make decision of whether or not to proceed with 1 more colonoscopy based on patient wishes and overall health.  Lisa Steinhart, DO, [facility]

## 2024-07-13 NOTE — Progress Notes (Signed)
 Chief Complaint: follow-up  Primary GI Doctor: Dr. San  HPI:  Patient is a  73  year old female patient with past medical history of GERD, hypertension,depression, Fibromyalgia, DVT, iron  deficiency anemia, who was referred to me by Marikay Eva POUR, PA on 04/19/24 for a complaint of acute diverticulitis, rectal bleeding. Patient last seen in GI office by myself on 04/25/24 for diverticulitis and rectal bleeding.  05/31/24 follow-up with oncology for 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 UTD with Colo/endo. Currently s/p Venofer  300 mg. Reviewed entire note.  Interval History   Patient presents for follow-up. She reports recent episode about 2 weeks ago where she had. stomach ache with diarrhea. She reports the diarrhea lasted about 4-5 days. She reports she had BRB with wiping and in the stool for one day. She reports all the symptoms have since then resolved. Patient enquires about when colonoscopy is due.    She has history of chronic constipation but reports that hasn't been a recent issue. She has not had to take any laxatives. Gave her samples of Linzess which she states she stopped but could not recall reason why.     History of Iron  deficiency, reports she is receiving IV iron  infusions with Hematology. Last seen in August. She reports she was diagnosed with IDA few years ago. Patient had colonoscopy/EGD in Sept 2024 and full report below from [facility].   Wt Readings from Last 3 Encounters:  03/27/24 198 lb 6.6 oz (90 kg)  03/26/24 198 lb 6.6 oz (90 kg)  03/06/24 200 lb (90.7 kg)   Past Medical History:  Diagnosis Date   Allergy    seasonal   Arthritis    Cataract    Cerebral aneurysm    Chronic kidney disease    Chronic pain syndrome    Complication of anesthesia    trouble waking up after back surgery   DDD (degenerative disc disease), lumbar    Depression    DVT (deep venous thrombosis) (HCC) 2002   Fibromyalgia    GERD (gastroesophageal reflux disease)    Heart murmur     Heart dr said normal for me   History of kidney stones    Hyperlipidemia    Hypertension    Hypothyroidism    Incontinence in female    Iron  deficiency anemia 11/01/2019   Irregular heart beat    Neuromuscular disorder (HCC)    peripheral neuropathy/feet   Obesity    OSA on CPAP    Pancolitis (HCC)    Pre-diabetes    RLS (restless legs syndrome)    UTI (urinary tract infection) 11/2022    Past Surgical History:  Procedure Laterality Date   BIOPSY  06/23/2023   Procedure: BIOPSY;  Surgeon: Aundria, Ladell POUR, MD;  Location: [facility];  Service: Gastroenterology;;   BRAIN SURGERY  2002   aneurysm   CEREBRAL ANEURYSM REPAIR  2002   COLONOSCOPY WITH PROPOFOL  N/A 11/10/2019   Procedure: COLONOSCOPY WITH PROPOFOL ;  Surgeon: Therisa Bi, MD;  Location: [facility];  Service: Endoscopy;  Laterality: N/A;   COLONOSCOPY WITH PROPOFOL  N/A 06/23/2023   Procedure: COLONOSCOPY WITH PROPOFOL ;  Surgeon: Toledo, Ladell POUR, MD;  Location: [facility] END[facility];  Service: Gastroenterology;  Laterality: N/A;   COSMETIC SURGERY     ESOPHAGOGASTRODUODENOSCOPY (EGD) WITH PROPOFOL  N/A 11/10/2019   Procedure: ESOPHAGOGASTRODUODENOSCOPY (EGD) WITH PROPOFOL ;  Surgeon: Therisa Bi, MD;  Location: [facility];  Service: Endoscopy;  Laterality: N/A;   ESOPHAGOGASTRODUODENOSCOPY (EGD) WITH PROPOFOL  N/A 06/23/2023   Procedure:  ESOPHAGOGASTRODUODENOSCOPY (EGD) WITH PROPOFOL ;  Surgeon: Toledo, Ladell POUR, MD;  Location: [facility];  Service: Gastroenterology;  Laterality: N/A;   EXTRACORPOREAL SHOCK WAVE LITHOTRIPSY Right 04/27/2019   Procedure: EXTRACORPOREAL SHOCK WAVE LITHOTRIPSY (ESWL);  Surgeon: Francisca Redell BROCKS, MD;  Location: [facility];  Service: Urology;  Laterality: Right;   EYE SURGERY     JOINT REPLACEMENT Bilateral    knee   KNEE ARTHROPLASTY Left 01/12/2020   Procedure: COMPUTER ASSISTED TOTAL KNEE ARTHROPLASTY;  Surgeon: Mardee Lynwood SQUIBB, MD;  Location: [facility];  Service:  Orthopedics;  Laterality: Left;   MALONEY DILATION  06/23/2023   Procedure: MALONEY DILATION;  Surgeon: Aundria, Ladell POUR, MD;  Location: [facility] END[facility];  Service: Gastroenterology;;   POLYPECTOMY  11/10/2019   Procedure: POLYPECTOMY;  Surgeon: Therisa Bi, MD;  Location: [facility];  Service: Endoscopy;;   REVERSE SHOULDER ARTHROPLASTY Right 01/06/2022   Procedure: REVERSE SHOULDER ARTHROPLASTY;  Surgeon: Edie Norleen PARAS, MD;  Location: [facility];  Service: Orthopedics;  Laterality: Right;   SPINAL CORD STIMULATOR BATTERY EXCHANGE N/A 02/01/2023   Procedure: INTERNAL PULSE GENERATOR REPLACEMENT ([employer]);  Surgeon: Clois Fret, MD;  Location: [facility];  Service: Neurosurgery;  Laterality: N/A;  LOCAL WITH MAC   SPINAL FUSION  07/10/2019   C4-5 corpectomy with C6-7 ACDF, C2-T3 spinal fusion   SPINE SURGERY     spinal fusion   THORACIC LAMINECTOMY FOR SPINAL CORD STIMULATOR N/A 12/25/2022   Procedure: THORACIC LAMINECTOMY FOR SPINAL CORD STIMULATOR IMPLANTATION ([employer]);  Surgeon: Clois Fret, MD;  Location: [facility];  Service: Neurosurgery;  Laterality: N/A;   TUBAL LIGATION      Current Outpatient Medications  Medication Sig Dispense Refill   Alpha-Lipoic Acid 600 MG CAPS Take 600 mg by mouth 2 (two) times daily.     amoxicillin -clavulanate (AUGMENTIN ) 875-125 MG tablet Take 1 tablet by mouth every 12 (twelve) hours. 14 tablet 0   benzonatate  (TESSALON ) 200 MG capsule Take by mouth.     fluticasone  (FLONASE ) 50 MCG/ACT nasal spray Place into the nose. As needed     gabapentin  (NEURONTIN ) 400 MG capsule Take 400 mg by mouth 4 (four) times daily.     JOURNAVX 50 MG TABS SMARTSIG:Tablet(s)     Lacosamide 100 MG TABS Take 100 mg by mouth.     lisinopril  (PRINIVIL ,ZESTRIL ) 40 MG tablet Take 40 mg by mouth at bedtime.     methocarbamol  (ROBAXIN ) 500 MG tablet Take 1 tablet (500 mg total) by mouth every 6 (six) hours as needed for muscle spasms. 40 tablet  0   mirtazapine (REMERON) 15 MG tablet Take 15 mg by mouth at bedtime.     ondansetron  (ZOFRAN -ODT) 4 MG disintegrating tablet Take 4 mg by mouth every 8 (eight) hours as needed.     pantoprazole  (PROTONIX ) 40 MG tablet Take 40 mg by mouth every morning.     senna-docusate (SENOKOT-S) 8.6-50 MG tablet Take 1 tablet by mouth daily. 20 tablet 0   topiramate (TOPAMAX) 25 MG tablet      venlafaxine  XR (EFFEXOR -XR) 150 MG 24 hr capsule Take 150 mg by mouth at bedtime. Take with 75 mg to equal 225 mg daily     venlafaxine  XR (EFFEXOR -XR) 75 MG 24 hr capsule Take 75 mg by mouth at bedtime. Take with 150 mg to equal 225 mg daily     WIXELA INHUB 100-50 MCG/ACT AEPB SMARTSIG:1 Puff(s) By Mouth Every 12 Hours     XYLITOL MT Use as directed 2 tablets in  the mouth or throat daily as needed (dry mouth).     No current facility-administered medications for this visit.    Allergies as of 04/24/2024 - Review Complete 04/19/2024  Allergen Reaction Noted   Contrast media [iodinated contrast media] Hives 11/09/2018   Duloxetine Swelling 12/31/2021   Pregabalin Swelling 12/31/2021   Iodine Hives 03/31/2022   Clarithromycin Nausea Only 03/07/2011   Spironolactone-hctz Rash 12/31/2021    Family History  Problem Relation Age of Onset   Hypertension Mother    Depression Mother    Diabetes Mother    Varicose Veins Mother    Hyperlipidemia Sister    Hypertension Sister    Depression Sister    Varicose Veins Sister    Depression Brother    Arthritis Maternal Grandmother    Diabetes Maternal Grandmother    Sleep apnea Neg Hx     Review of Systems:    Constitutional: No weight loss, fever, chills, weakness or fatigue HEENT: Eyes: No change in vision               Ears, Nose, Throat:  No change in hearing or congestion Skin: No rash or itching Cardiovascular: No chest pain, chest pressure or palpitations   Respiratory: No SOB or cough Gastrointestinal: See HPI and otherwise negative Genitourinary:  No dysuria or change in urinary frequency Neurological: No headache, dizziness or syncope Musculoskeletal: No new muscle or joint pain Hematologic: No bleeding or bruising Psychiatric: No history of depression or anxiety    Physical Exam:  Vital signs: There were no vitals taken for this visit.  Constitutional:   Pleasant female appears to be in NAD, Well developed, Well nourished, alert and cooperative Throat: Oral cavity and pharynx without inflammation, swelling or lesion.  Respiratory: Respirations even and unlabored. Lungs clear to auscultation bilaterally.   No wheezes, crackles, or rhonchi.  Cardiovascular: Normal S1, S2. Regular rate and rhythm. No peripheral edema, cyanosis or pallor.  Gastrointestinal:  Soft, nondistended, nontender. No rebound or guarding. Hypoactive bowel sounds. No appreciable masses or hepatomegaly. Rectal:  Not performed. Declined. Msk:  uses cane Neurologic:  Alert and  oriented x4;  grossly normal neurologically.  Skin:   Dry and intact without significant lesions or rashes. Psychiatric: Oriented to person, place and time. Demonstrates good judgement and reason without abnormal affect or behaviors.  RELEVANT LABS AND IMAGING: CBC    Latest Ref Rng & Units 03/27/2024    3:34 PM 03/26/2024   12:58 PM 02/08/2024    2:02 PM  CBC  WBC 4.0 - 10.5 K/uL 6.7  7.1  6.3   Hemoglobin 12.0 - 15.0 g/dL 88.6  87.0  88.2   Hematocrit 36.0 - 46.0 % 34.9  40.0  35.9   Platelets 150 - 400 K/uL 243  255  225      CMP     Latest Ref Rng & Units 03/27/2024    3:34 PM 03/26/2024   12:58 PM 02/08/2024    2:02 PM  CMP  Glucose 70 - 99 mg/dL 868  860  86   BUN 8 - 23 mg/dL 15  19  29    Creatinine 0.44 - 1.00 mg/dL 8.76  8.82  8.75   Sodium 135 - 145 mmol/L 142  143  142   Potassium 3.5 - 5.1 mmol/L 3.6  3.5  4.2   Chloride 98 - 111 mmol/L 104  105  107   CO2 22 - 32 mmol/L 24  22  28    Calcium  8.9 -  10.3 mg/dL 9.4  9.4  9.6   Total Protein 6.5 - 8.1 g/dL 6.5  6.8   6.6   Total Bilirubin 0.0 - 1.2 mg/dL 0.4  0.5  0.3   Alkaline Phos 38 - 126 U/L 81  93  88   AST 15 - 41 U/L 21  19  16    ALT 0 - 44 U/L 12  12  8     Lab Results  Component Value Date   IRON  49 05/31/2024   TIBC 309 05/31/2024   FERRITIN 144 05/31/2024  04/25/24 labs show: iron  38, ferritin 27.5, hgb 10.9 05/31/24 labs show: b 12 383, hgb 12.2  GI procedures: 06/23/23 colonoscopy/EGD for IDA at [facility] with Dr. Aundria - Non- bleeding internal hemorrhoids.  - Diverticulosis in the sigmoid colon - One 8 mm polyp at the ileocecal valve, removed with a cold snare. Resected and retrieved Path:3  Ileocecal valve, polyp, cold snare :      TUBULAR ADENOMA.      NEGATIVE FOR HIGH-GRADE DYSPLASIA.       4. Descending Colon Polyp, cbx :      TUBULAR ADENOMA.      NEGATIVE FOR HIGH-GRADE DYSPLASIA.  06/23/23 EGD - Normal examined duodenum. - Gastritis. - 2 cm hiatal hernia. - The examination was otherwise normal. - Mucosal nodule found in the esophagus. Biopsied. - Mild Schatzki ring. Dilated. Path:1. Stomach, biopsy, cbx :       GASTRIC ANTRAL MUCOSA WITH FOCAL MILD REACTIVE EPITHELIAL CHANGES.       GASTRIC OXYNTIC MUCOSA WITHOUT SIGNIFICANT DIAGNOSTIC ALTERATION.       NO H.PYLORI IDENTIFIED ON H&E STAIN.       NEGATIVE FOR INTESTINAL METAPLASIA OR DYSPLASIA.        2. Esophagogastric junction, biopsy, nodule, cbx :       SQUAMOCOLUMNAR MUCOSA WITH INTESTINAL METAPLASIA.       NEGATIVE FOR DYSPLASIA.       SEE COMMENT.  11/2019 colonoscopy/EGD with Dr. Ruel - One 6 mm polyp in the distal ascending colon, removed with a cold snare. Resected and retrieved. - A single ( solitary) ulcer at the splenic flexure. Tattooed. - The examination was otherwise normal. Path: D. COLON POLYP, ASCENDING; COLD SNARE:  - TUBULAR ADENOMA.  - NEGATIVE FOR HIGH-GRADE DYSPLASIA AND MALIGNANCY.  EGD - Normal examined duodenum. Biopsied. - Gastritis. Biopsied. - Erythematous mucosa in the cardia.  Biopsied. - Normal esophagus. - The examination was otherwise normal. Path: A. DUODENUM; COLD BIOPSY:  - ENTERIC MUCOSA WITH PRESERVED VILLOUS ARCHITECTURE AND NO SIGNIFICANT  HISTOPATHOLOGIC CHANGE.  - NEGATIVE FOR FEATURES OF CELIAC, DYSPLASIA, AND MALIGNANCY.   B. STOMACH, CARDIA; COLD BIOPSY:  - CHRONIC ACTIVE GASTRITIS.  - INTESTINAL METAPLASIA.  - NEGATIVE FOR H. PYLORI, DYSPLASIA, AND MALIGNANCY.  C. STOMACH, ANTRUM; COLD BIOPSY: - GASTRIC ANTRAL AND OXYNTIC MUCOSA WITH FEATURES OF MILD REACTIVE GASTROPATHY. - NEGATIVE FOR H. PYLORI, DYSPLASIA, AND MALIGNANCY.   Imaging: CTAP 01/20/24  IMPRESSION: 1. No acute localizing process in the abdomen or pelvis. 2. Stable fluid collection in the lower anterior abdominal wall superficial fascia. 3. Stable hernia with disruption of the internal fascia of the left rectus musculature only. This contains nondilated small bowel. 4. Cholelithiasis. 5. Small hiatal hernia. 6. Sigmoid colon diverticulosis. 7. Stable left lower lobe pulmonary nodules measuring up to 4 mm. No follow-up needed if patient is low-risk (and has no known or suspected primary neoplasm). Non-contrast chest CT can be considered in 12 months  if patient is high-risk. This recommendation follows the consensus statement: Guidelines for Management of Incidental Pulmonary Nodules Detected on CT Images: From the Fleischner Society 2017; Radiology 2017; 284:228-243.   Aortic Atherosclerosis (ICD10-I70.0).  03/26/24 CTAP IMPRESSION: 1. Findings compatible with sigmoid diverticulitis. No abscess or free air. 2. Stable left lower lobe pulmonary nodules measuring up to 4 mm. No follow-up needed if patient is low-risk.This recommendation follows the consensus statement: Guidelines for Management of Incidental Pulmonary Nodules Detected on CT Images: From the Fleischner Society 2017; Radiology 2017; 284:228-243. 3. Cholelithiasis. 4. Aortic atherosclerosis. 5. Remaining  incidental findings as described above.   Assessment: Encounter Diagnoses  Name Primary?   Rectal bleeding Yes   Constipation, unspecified constipation type       73 year old female patient that presents for follow-up.  Patient recently had bout of diarrhea with intermittent bright red blood with wiping and in stool.  I suspect she had a enteric infection or overflow incontinence that irritated her hemorrhoids.  She reports the symptoms have since then resolved.  Upon examination she has hypoactive bowel sounds we will go ahead and do abdominal x-ray to rule out obstipation.  If x-ray shows large stool burden patient will need to be on daily laxative to keep bowels regulated.  Will also check CBC due to recent bleeding.  I also provided RectiCare topical ointment as needed for hemorrhoids.  Patient's last colonoscopy was in September 2024 with [facility], will defer recall to Dr. San.  Plan: -Recommend high fiber diet -otc miralax  po daily - check CBC today - order abdominal xray 2 view  -Recticare samples hemorrhoids prn -recall colonoscopy per Dr. Kemp  Thank you for the courtesy of this consult. Please call me with any questions or concerns.   Francene Mcerlean, FNP-C [facility] 04/25/2024 11:56

## 2024-07-13 NOTE — Patient Instructions (Addendum)
 Recommend high fiber diet No pushing or straining on toilet OTC miralax  as needed.   Hemorrhoids Recticare topical apply twice daily if symptoms return  Your provider has requested that you go to the basement level for lab work before leaving today. Press B on the elevator. The lab is located at the first door on the left as you exit the elevator.  Your provider has requested that you have an abdominal x ray before leaving today. Please go to the basement floor to our Radiology department for the test.  _______________________________________________________  If your blood pressure at your visit was 140/90 or greater, please contact your primary care physician to follow up on this.  _______________________________________________________  If you are age 9 or older, your body mass index should be between 23-30. Your Body mass index is 36.84 kg/m. If this is out of the aforementioned range listed, please consider follow up with your Primary Care Provider.  If you are age 4 or younger, your body mass index should be between 19-25. Your Body mass index is 36.84 kg/m. If this is out of the aformentioned range listed, please consider follow up with your Primary Care Provider.   ________________________________________________________  The [facility] providers would like to encourage you to use MYCHART to communicate with providers for non-urgent requests or questions.  Due to long hold times on the telephone, sending your provider a message by [facility] may be a faster and more efficient way to get a response.  Please allow 48 business hours for a response.  Please remember that this is for non-urgent requests.  _______________________________________________________  [facility] is using a team-based approach to care.  Your team is made up of your doctor and two to three APPS. Our APPS (Nurse Practitioners and Physician Assistants) work with your physician to ensure care  continuity for you. They are fully qualified to address your health concerns and develop a treatment plan. They communicate directly with your gastroenterologist to care for you. Seeing the Advanced Practice Practitioners on your physician's team can help you by facilitating care more promptly, often allowing for earlier appointments, access to diagnostic testing, procedures, and other specialty referrals.   Thank you for trusting me with your gastrointestinal care. Deanna May, FNP-C

## 2024-07-14 ENCOUNTER — Ambulatory Visit: Payer: Self-pay | Admitting: Gastroenterology

## 2024-07-27 NOTE — Telephone Encounter (Signed)
-----   Message from Cathryne PARAS May sent at 07/26/2024 11:54 AM EDT ----- Karna- Please let patient know below, they have not read message  Deanna, NP ----- Message ----- From: Interface, Lab In Three Zero One Sent: 07/13/2024  12:39 PM EDT To: Cathryne PARAS May, NP

## 2024-08-10 ENCOUNTER — Encounter: Payer: Self-pay | Admitting: Urology

## 2024-09-04 ENCOUNTER — Inpatient Hospital Stay: Attending: Medical Oncology

## 2024-09-04 ENCOUNTER — Inpatient Hospital Stay: Admitting: Medical Oncology

## 2024-09-25 ENCOUNTER — Encounter: Payer: Self-pay | Admitting: Family Medicine

## 2024-09-25 ENCOUNTER — Telehealth: Payer: Self-pay | Admitting: Family Medicine

## 2024-09-25 ENCOUNTER — Ambulatory Visit: Admitting: Family Medicine

## 2024-09-25 NOTE — Telephone Encounter (Signed)
 Pt called to Cancel appt due to being sick appt Canceled

## 2024-10-06 ENCOUNTER — Telehealth: Payer: Self-pay | Admitting: Neurosurgery

## 2024-10-06 DIAGNOSIS — Z9689 Presence of other specified functional implants: Secondary | ICD-10-CM

## 2024-10-06 NOTE — Telephone Encounter (Signed)
 DOS: 02/01/23 placement on new pulse generator for SCS   Okay to see PA next week. Would have her come 1 hour prior for thoracic and lumbar xrays (AP/lat) and to meet with [employer] reps.

## 2024-10-06 NOTE — Addendum Note (Signed)
 Addended by: Marlen Koman on: 10/06/2024 04:24 PM   Modules accepted: Orders

## 2024-10-06 NOTE — Telephone Encounter (Signed)
 I spoke with Mrs Lisa Good. She states the device is on, and she charges it once a week, but it hardly ever loses charge. She can feel it, but it isn't helping like it used to. I explained that we would like to have to have her come 1 hour prior to her appointment for an xray and to meet with the [employer] rep for interrogation. She is agreeable to this plan. Marjorie with [employer] will meet her here on 1/6 prior to her appointment with Lisa Good.

## 2024-10-06 NOTE — Telephone Encounter (Signed)
 Patient reports increased pain at the spinal stimulator site and feels the device may have shifted position. They also state the stimulator never requires charging and never loses power, leading them to believe it may no longer be functioning. Patient has not contacted [employer] in approximately one year and would like to schedule an appointment with Dr. CINDERELLA. Is it okay to proceed with scheduling?

## 2024-10-10 ENCOUNTER — Ambulatory Visit: Admitting: Physician Assistant

## 2024-10-10 ENCOUNTER — Ambulatory Visit (INDEPENDENT_AMBULATORY_CARE_PROVIDER_SITE_OTHER)

## 2024-10-10 VITALS — BP 122/82 | Wt 221.0 lb

## 2024-10-10 DIAGNOSIS — Z09 Encounter for follow-up examination after completed treatment for conditions other than malignant neoplasm: Secondary | ICD-10-CM

## 2024-10-10 DIAGNOSIS — Z9689 Presence of other specified functional implants: Secondary | ICD-10-CM | POA: Diagnosis not present

## 2024-10-10 DIAGNOSIS — G894 Chronic pain syndrome: Secondary | ICD-10-CM | POA: Diagnosis not present

## 2024-10-10 NOTE — Progress Notes (Signed)
" ° °  REFERRING PHYSICIAN:  Mclaughlin, Miriam K, Pa [address] [place],  [place] [address]  DOS: 02/01/23 placement on new pulse generator for SCS  HISTORY OF PRESENT ILLNESS: Lisa Good is status post placement of new pulse generator for SCS.  She unfortunately is has some difficulty with coverage from her stimulator for her pain.  She is concerned that the stimulator or the battery is out of place.  In addition, she complains of significant neuropathic pain primarily in bilateral feet.    PHYSICAL EXAMINATION:  General: Patient is well developed, well nourished, calm, collected, and in no apparent distress.   NEUROLOGICAL:  General: In no acute distress.   Awake, alert, oriented to person, place, and time.  Pupils equal round and reactive to light.  Facial tone is symmetric.     Strength:            Side Iliopsoas Quads Hamstring PF DF EHL  R 5 5 5 5 5 5   L 5 5 5 5 5 5    Incisions are c/d/I Location of battery appears stable.  ROS (Neurologic):  Negative except as noted above  IMAGING: Will await final reads, but leads to stimulator  do not appear to be broken or fractured.  ASSESSMENT/PLAN:  Lisa Good is continued pain despite stimulator placement several months ago.  Spoke with SCS  rep who was present today to try to help optimize this for her and help with her pain.  When talking to the patient, is also discovered that she does not really taking Lyrica anymore secondary to weight gain.  She knowledges that when she did take it she did have significant relief in her neuropathic pain in her feet.  I discussed with her how important this is that I feel like it is on board to help with her pain.  She plans to restart this as a result.  Advised her to keep us  updated on any changes.  She is going to try to work with her primary care provider to help manage her weight while staying on this as well.  Lisa Decamp, PA-C Department of  neurosurgery "
